# Patient Record
Sex: Male | Born: 1955 | Race: Black or African American | Hispanic: No | Marital: Married | Smoking: Former smoker
Health system: Southern US, Community
[De-identification: ages and names within clinical notes are randomized; demographics above are authoritative.]

## PROBLEM LIST (undated history)

## (undated) DIAGNOSIS — I10 Essential (primary) hypertension: Secondary | ICD-10-CM

## (undated) DIAGNOSIS — E291 Testicular hypofunction: Secondary | ICD-10-CM

## (undated) DIAGNOSIS — Z9889 Other specified postprocedural states: Secondary | ICD-10-CM

## (undated) DIAGNOSIS — E785 Hyperlipidemia, unspecified: Secondary | ICD-10-CM

## (undated) DIAGNOSIS — N529 Male erectile dysfunction, unspecified: Secondary | ICD-10-CM

## (undated) DIAGNOSIS — M1990 Unspecified osteoarthritis, unspecified site: Secondary | ICD-10-CM

## (undated) HISTORY — DX: Hyperlipidemia, unspecified: E78.5

## (undated) HISTORY — DX: Testicular hypofunction: E29.1

## (undated) HISTORY — DX: Unspecified osteoarthritis, unspecified site: M19.90

## (undated) HISTORY — PX: TONSILLECTOMY: SUR1361

## (undated) HISTORY — DX: Essential (primary) hypertension: I10

## (undated) HISTORY — PX: KNEE SURGERY: SHX244

## (undated) HISTORY — PX: INCISION AND DRAINAGE PERIRECTAL ABSCESS: SHX1804

## (undated) HISTORY — PX: OTHER SURGICAL HISTORY: SHX169

## (undated) HISTORY — PX: APPENDECTOMY: SHX54

## (undated) HISTORY — DX: Male erectile dysfunction, unspecified: N52.9

## (undated) HISTORY — DX: Other specified postprocedural states: Z98.890

---

## 1998-10-20 ENCOUNTER — Ambulatory Visit: Admission: RE | Admit: 1998-10-20 | Discharge: 1998-10-20 | Payer: Self-pay | Admitting: *Deleted

## 1998-10-20 ENCOUNTER — Encounter: Payer: Self-pay | Admitting: *Deleted

## 1998-12-15 ENCOUNTER — Encounter: Payer: Self-pay | Admitting: Occupational Medicine

## 1998-12-15 ENCOUNTER — Ambulatory Visit: Admission: RE | Admit: 1998-12-15 | Discharge: 1998-12-15 | Payer: Self-pay | Admitting: Occupational Medicine

## 1999-06-25 ENCOUNTER — Encounter: Admission: RE | Admit: 1999-06-25 | Discharge: 1999-09-23 | Payer: Self-pay | Admitting: Geriatric Medicine

## 1999-10-04 ENCOUNTER — Encounter: Payer: Self-pay | Admitting: Emergency Medicine

## 1999-10-04 ENCOUNTER — Emergency Department (HOSPITAL_COMMUNITY): Admission: EM | Admit: 1999-10-04 | Discharge: 1999-10-04 | Payer: Self-pay | Admitting: Emergency Medicine

## 2003-06-06 ENCOUNTER — Encounter: Admission: RE | Admit: 2003-06-06 | Discharge: 2003-06-06 | Payer: Self-pay | Admitting: Geriatric Medicine

## 2004-10-09 ENCOUNTER — Emergency Department (HOSPITAL_COMMUNITY): Admission: EM | Admit: 2004-10-09 | Discharge: 2004-10-10 | Payer: Self-pay | Admitting: Emergency Medicine

## 2006-01-31 ENCOUNTER — Inpatient Hospital Stay (HOSPITAL_COMMUNITY): Admission: EM | Admit: 2006-01-31 | Discharge: 2006-02-01 | Payer: Self-pay | Admitting: Emergency Medicine

## 2006-08-08 HISTORY — PX: COLONOSCOPY: SHX174

## 2006-08-11 ENCOUNTER — Ambulatory Visit: Payer: Self-pay | Admitting: Family Medicine

## 2006-08-19 ENCOUNTER — Ambulatory Visit: Payer: Self-pay | Admitting: Gastroenterology

## 2006-08-27 ENCOUNTER — Ambulatory Visit: Payer: Self-pay | Admitting: Gastroenterology

## 2006-10-22 ENCOUNTER — Ambulatory Visit: Payer: Self-pay | Admitting: Family Medicine

## 2006-10-24 ENCOUNTER — Ambulatory Visit: Payer: Self-pay | Admitting: Family Medicine

## 2006-10-27 ENCOUNTER — Ambulatory Visit: Payer: Self-pay | Admitting: Family Medicine

## 2007-04-09 ENCOUNTER — Inpatient Hospital Stay (HOSPITAL_COMMUNITY): Admission: EM | Admit: 2007-04-09 | Discharge: 2007-04-10 | Payer: Self-pay | Admitting: Emergency Medicine

## 2007-10-27 ENCOUNTER — Ambulatory Visit: Payer: Self-pay | Admitting: Internal Medicine

## 2007-10-27 ENCOUNTER — Ambulatory Visit: Payer: Self-pay | Admitting: *Deleted

## 2007-10-27 LAB — CONVERTED CEMR LAB
Alkaline Phosphatase: 67 units/L (ref 39–117)
BUN: 14 mg/dL (ref 6–23)
Basophils Absolute: 0 10*3/uL (ref 0.0–0.1)
CO2: 23 meq/L (ref 19–32)
Chloride: 100 meq/L (ref 96–112)
Creatinine, Ser: 0.87 mg/dL (ref 0.40–1.50)
HCT: 44.9 % (ref 39.0–52.0)
HDL: 47 mg/dL (ref >39)
LDL Cholesterol: 166 mg/dL — ABNORMAL HIGH (ref 0–99)
Lymphs Abs: 4.2 10*3/uL — ABNORMAL HIGH (ref 0.7–4.0)
MCV: 94.9 fL (ref 78.0–100.0)
Microalb, Ur: 0.26 mg/dL (ref 0.00–1.89)
Monocytes Relative: 7 % (ref 3–12)
Neutrophils Relative %: 43 % (ref 43–77)
Platelets: 257 10*3/uL (ref 150–400)
RDW: 12.5 % (ref 11.5–15.5)
Sodium: 138 meq/L (ref 135–145)
Total Bilirubin: 0.8 mg/dL (ref 0.3–1.2)
Total CHOL/HDL Ratio: 5.4
VLDL: 41 mg/dL — ABNORMAL HIGH (ref 0–40)

## 2007-12-07 ENCOUNTER — Encounter (INDEPENDENT_AMBULATORY_CARE_PROVIDER_SITE_OTHER): Payer: Self-pay | Admitting: Family Medicine

## 2007-12-07 ENCOUNTER — Ambulatory Visit: Payer: Self-pay | Admitting: Internal Medicine

## 2007-12-07 LAB — CONVERTED CEMR LAB: PSA: 1.77 ng/mL (ref 0.10–4.00)

## 2007-12-12 ENCOUNTER — Emergency Department (HOSPITAL_COMMUNITY): Admission: EM | Admit: 2007-12-12 | Discharge: 2007-12-12 | Payer: Self-pay | Admitting: Emergency Medicine

## 2007-12-14 ENCOUNTER — Ambulatory Visit (HOSPITAL_COMMUNITY): Admission: RE | Admit: 2007-12-14 | Discharge: 2007-12-14 | Payer: Self-pay | Admitting: Family Medicine

## 2008-04-01 ENCOUNTER — Ambulatory Visit: Payer: Self-pay | Admitting: Family Medicine

## 2008-04-11 ENCOUNTER — Ambulatory Visit: Payer: Self-pay | Admitting: Internal Medicine

## 2008-07-12 ENCOUNTER — Ambulatory Visit: Payer: Self-pay | Admitting: Family Medicine

## 2008-07-25 ENCOUNTER — Ambulatory Visit: Payer: Self-pay | Admitting: Family Medicine

## 2008-10-20 ENCOUNTER — Ambulatory Visit: Payer: Self-pay | Admitting: Family Medicine

## 2008-10-20 LAB — CONVERTED CEMR LAB
CO2: 22 meq/L (ref 19–32)
Calcium: 9.2 mg/dL (ref 8.4–10.5)
Chloride: 106 meq/L (ref 96–112)
Creatinine, Ser: 0.9 mg/dL (ref 0.40–1.50)
Glucose, Bld: 157 mg/dL — ABNORMAL HIGH (ref 70–99)
HDL: 40 mg/dL (ref >39)
Total Bilirubin: 0.8 mg/dL (ref 0.3–1.2)
Total CHOL/HDL Ratio: 4.8
Total Protein: 7.1 g/dL (ref 6.0–8.3)
Triglycerides: 247 mg/dL — ABNORMAL HIGH (ref <150)
VLDL: 49 mg/dL — ABNORMAL HIGH (ref 0–40)

## 2008-11-10 ENCOUNTER — Ambulatory Visit: Payer: Self-pay | Admitting: Family Medicine

## 2009-09-01 ENCOUNTER — Ambulatory Visit: Payer: Self-pay | Admitting: Family Medicine

## 2009-09-01 ENCOUNTER — Encounter (INDEPENDENT_AMBULATORY_CARE_PROVIDER_SITE_OTHER): Payer: Self-pay | Admitting: Adult Health

## 2009-09-01 LAB — CONVERTED CEMR LAB: Microalb, Ur: 0.66 mg/dL (ref 0.00–1.89)

## 2009-09-06 ENCOUNTER — Ambulatory Visit: Payer: Self-pay | Admitting: Internal Medicine

## 2009-12-22 ENCOUNTER — Emergency Department (HOSPITAL_COMMUNITY): Admission: EM | Admit: 2009-12-22 | Discharge: 2009-12-22 | Payer: Self-pay | Admitting: Family Medicine

## 2010-01-05 ENCOUNTER — Ambulatory Visit: Payer: Self-pay | Admitting: Family Medicine

## 2010-11-20 NOTE — Op Note (Signed)
NAME:  Jerry [name], ESCO NO.:  [id number]   MEDICAL RECORD NO.:  [id number]          PATIENT TYPE:  INP   LOCATION:  2550                         FACILITY:  [facility]   PHYSICIAN:  Lennie Muckle, MD      DATE OF BIRTH:  04-14-1956   DATE OF PROCEDURE:  DATE OF DISCHARGE:                               OPERATIVE REPORT   HISTORY OF PRESENT ILLNESS:  Jerry [name] is a 55 year old male, who was  seen previously in the emergency room for a perirectal abscess.  Procedure was attempted in the emergency room by the physicians;  however, the lesion was deeper than their comfort level and thus it was  elected to perform incision and drainage in the operating room with  rectal exam.   PREOPERATIVE DIAGNOSIS:  Perirectal abscess.   POSTOPERATIVE DIAGNOSIS:  Perirectal abscess.   PROCEDURE:  Incision and drainage of perirectal abscess and rectal exam.   SURGEON:  Lennie Muckle, MD   ASSISTANT:  None.   ANESTHESIA:  Patient was placed under general endotracheal anesthesia.   ESTIMATED BLOOD LOSS:  Approximately 20 mL.   COMPLICATIONS:  No immediate complications.   DRAINS:  A #16 Malecot placed in the abscess cavity.   DETAILS OF PROCEDURE:  Jerry [name] was identified in the preoperative  holding area, taken to the operating room suite where he was placed in  the supine position.  He was given general endotracheal anesthesia.  Preop antibiotics of Kefzol 1 gram was administered.  He was then placed  in a lithotomy position.  His buttock area was prepped and draped in the  usual sterile fashion.  A time-out identifying the patient and the  procedure was identified.  The area of concern was palpable just to the  left of the perineum.  A #10 blade was used to incise just lateral to  the area of fluctuance to avoid the external sphincter muscle.  The  abscess cavity was successfully entered.  There was purulent fluid,  which was expressed.  Anaerobic and aerobic cultures were  sent to the  microbiology lab.  The area was irrigated.  Hemostasis was achieved with  the electrocautery.  At that time, it was elected to place a Malecot  drain within the abscess cavity due to the difficulty of dressing  changes in the vicinity.  A #16 Malecot was placed in the abscess cavity  and secured with a 2-0 nylon suture.  A moist gauze was placed just  adjacent to the Malecot  drain to maintain hemostasis within the area overnight.  Dry gauze and  ABD pad were then placed for final dressing.  Patient was then extubated  and recovered in the operating room in stable condition.   SPECIMENS:  There were 2 microbiology cultures, 1 aerobic and 1  anaerobic.      Lennie Muckle, MD  Electronically Signed     ALA/MEDQ  D:  04/09/2007  T:  04/09/2007  Job:  [id number]

## 2010-11-20 NOTE — H&P (Signed)
NAME:  Jerry [name], Jerry [name] NO.:  [id number]   MEDICAL RECORD NO.:  [id number]          PATIENT TYPE:  EMS   LOCATION:  MAJO                         FACILITY:  [facility]   PHYSICIAN:  Dr. Freida Busman              DATE OF BIRTH:  Jan 24, 1956   DATE OF ADMISSION:  04/09/2007  DATE OF DISCHARGE:                              HISTORY & PHYSICAL   ADMITTING PHYSICIAN:  Dr. Bertram Savin.   PRIMARY CARE PHYSICIAN:  Dr. Merlene Laughter.   CHIEF COMPLAINT:  Perirectal pain.   HISTORY OF PRESENT ILLNESS:  Jerry [name] is a 51-year male patient who  is a diabetic, has a history of prior incision and drainage of a peri-  rectal abscess in July 2007 by Dr. Corliss Skains.  This was his first  occurrence.  The patient noticed increased swelling and pain this past  Monday, symptoms worsened.  He presented to the ER.  He was found to  have a glucose elevated at 414.  The EDP attempted I&D of the area with  recent upper puncture wounds, but was unsuccessful in completely  draining the abscessed region.  His subsequently performed an ultrasound  that showed a deeper 3 x 3-cm fluid collection adjacent to the rectum.  Surgery was consulted, review of systems as above.  The patient denies  fever.  He has had significant pain, especially in the past 24 hours.  This area was not draining, until they presented to the ER, where  several attempts were made to perform I&D.  He does have a history of  constipation and hemorrhoids.  He does check his glucometers daily, but  does not have sliding scale insulin available if needed.   SOCIAL HISTORY:  Married.  No alcohol, no tobacco.   PAST MEDICAL HISTORY:  1. Diabetes.  2. Constipation.  3. Hemorrhoids.   PAST SURGICAL HISTORY:  I&D of peri-rectal abscess 2007 by Marcille Blanco.  Colonoscopy 1 year ago, no significant findings.   ALLERGIES:  NKDA.   CURRENT MEDICATIONS:  Include:  1. Glucophage 1000 mg b.i.d.  2. Aspirin 325 mg daily.  3. Multiple vitamins,  over-the-counter daily.   PHYSICAL EXAMINATION:  GENERAL:  Pleasant male complaining of  significant perirectal pain.  VITAL SIGNS:  Temperature 97.8, BP 150/97, pulse 122 and regular,  respirations 22.  NEURO:  Patient is alert and oriented x3, moving all extremities x4.  No  focal deficits.  HEENT:  Head normocephalic, sclerae noninjected.  NECK:  Supple, no adenopathy.  CHEST:  Bilateral lung sounds, clear to auscultation.  Respiratory  effort is non-labored.  CARDIAC:  S1, S2.  No rubs, murmurs, thrills or gallops.  Pulse is  regular, tachycardic and no JVD.  ABDOMEN:  Soft, nontender, nondistended without hepatosplenomegaly,  masses or bruits.  RECTAL:  Again shows a fluctuant area in the left peri-rectal region,  areas of separate attempts to I&D with some tannish purulent drainage,  but still significant amount of fluctuance closer to the perirectal area  because of significant pain, unable to perform a DRE to determine if  a  deeper abscess area.  EXTREMITIES:  Symmetrical in appearance without edema, cyanosis or  clubbing.   LABS:  Sodium 132, potassium 4.4, CO2 22, glucose 414, BUN 10,  creatinine 0.9, hemoglobin 15, hematocrit 46.  A CBC was not checked.   DIAGNOSTIC:  See ultrasound as noted with the left perirectal abscess,  measuring about 3 x 3 cm.   IMPRESSION:  1. Perirectal abscess.  2. Uncontrolled diabetes with associated hyperglycemia and      hyponatremia.  3. Hypovolemia secondary to hyperglycemia with an associated      tachycardia.   PLAN:  1. Take the patient to the operating room for I&D today, as well as      rectal exam under anesthesia.  2. Dr. Freida Busman has ordered Ancef IV pre-op.  3. In addition, because of patient's glucose being significantly      elevated, he will be given 10 units of regular insulin subcu now      and will need to repeat a glucose level in the next 1-2 hours.  4. Will go ahead and check a CBC stat as well, since this was not  done      in the ER.  5. If the patient remains in the hospital due to complications related      to hyperglycemia/uncontrolled diabetes, will need to consult      internal medicine to assist.      Revonda Standard L. Rennis Harding, N.P.    ______________________________  Dr. Freida Busman    ALE/MEDQ  D:  04/09/2007  T:  04/10/2007  Job:  [id number]   cc:   Jerry [name], M.D.

## 2010-11-23 NOTE — Discharge Summary (Signed)
NAME:  Jerry [name], Jerry [name] NO.:  [id number]   MEDICAL RECORD NO.:  [id number]          PATIENT TYPE:  INP   LOCATION:  6729                         FACILITY:  [facility]   PHYSICIAN:  Lennie Muckle, MD      DATE OF BIRTH:  April 09, 1956   DATE OF ADMISSION:  04/09/2007  DATE OF DISCHARGE:  04/10/2007                               DISCHARGE SUMMARY   DISCHARGING PHYSICIAN:  Dr. Bertram Savin.   PRIMARY CARE PHYSICIAN:  Dr. Merlene Laughter.   CHIEF COMPLAINT/REASON FOR ADMISSION:  Jerry [name] is a 51-year male  patient with prior perirectal abscess drainage in 2007 by Dr. Corliss Skains who  presented to the ER with similar symptoms, increased swelling and pain,  glucose elevated at 414. Ultrasound demonstrated a deep 3 x 3-cm fluid  collection adjacent to the rectum. A surgical consultation was obtained.  The patient was evaluated by Dr. Freida Busman. His abdominal exam was  unremarkable.  His rectal exam demonstrated a fluctuant area in the left  perirectal region with some tannish purulent drainage noted from a small  incision made by the EDP in an attempt to perform I&D in the ER. Because  of significant pain and swelling, a DRE was unable to be performed.  The  patient's labs were checked which had a BMET with a sodium of 132,  potassium 4.4, glucose 414, BUN 10, creatinine 0.9. A CBC has not been  checked. An ultrasound was ordered as noted.   ADMISSION DIAGNOSES:  1. Perirectal abscess.  2. Diabetes with hyperglycemia and hyponatremia secondary to      infection.  3. Hypovolemia secondary to hypoglycemia and tachycardia.   HOSPITAL COURSE:  The patient was admitted from the ER. He was taken  directly to the operating room by Dr. Freida Busman to undergo intraoperative  incision and drainage of perirectal abscess.  He was given Ancef IV  preoperatively.   On the day of admission, the patient did undergo an intraoperative I&D  of a perirectal abscess, tolerated the procedure well and was sent  back  to the general floor. On postop day #1 he had a Malecot drain sutured  into place to his Foley bag.  He had no purulence from around this area.  The Foley bag was removed and the drain was irrigated and no purulence  returned. The patient was instructed on how to flush this drain tube and  was otherwise deemed appropriate for discharge home with a drain tube in  place and to follow up with Dr. Freida Busman in 1 week.   FINAL DISCHARGE DIAGNOSIS:  1. Perirectal abscess status post intraoperative incision and      drainage.  Cultures pending at time of discharge.  1. Diabetes with hyperglycemia secondary to infection.  2. Volume depletion resolved after intravenous fluid hydration.   DISCHARGE MEDICATIONS:  1. Metformin 1000 mg b.i.d.  2. Aspirin 325 mg daily.  3. Multiple vitamin daily.  4. Percocet 5/325, 1-2 tabs every 4h as needed for pain.  5. Over-the-counter ibuprofen with food or snack as directed.  6. Over-the-counter MiraLax daily to prevent constipation.  7. Over-the-counter Colace two times daily to prevent constipation.   WOUND CARE:  Flush drain two times daily with 20-30 mL of normal saline  while seated over toilet. I also recommended to use baby wipes to clean  around wound after BM.   DIET:  Carb modified. Also recommend check blood glucose levels before  meals and at bedtime and record.   ACTIVITY:  Return to work in 1 week. Increase activity slowly.  May walk  up steps.  May shower.  No lifting for 1 week.  No driving while taking  Percocet.   FOLLOW-UP APPOINTMENTS:  He is to see Dr. Freida Busman in the office on October  8.      Allison L. Kennith Center, MD  Electronically Signed    ALE/MEDQ  D:  05/14/2007  T:  05/15/2007  Job:  [id number]   cc:   Hal T. Stoneking, M.D.

## 2010-11-23 NOTE — Op Note (Signed)
NAME:  ARTURO, SOFRANKO NO.:  [id number]   MEDICAL RECORD NO.:  [id number]          PATIENT TYPE:  INP   LOCATION:  0103                         FAC[facility]  [facility]   PHYSICIAN:  Wilmon Arms. Corliss Skains, M.D. DATE OF BIRTH:  1955-09-05   DATE OF PROCEDURE:  01/30/2006  DATE OF DISCHARGE:                                 OPERATIVE REPORT   PREOPERATIVE DIAGNOSIS:  Perirectal abscess.   POSTOPERATIVE DIAGNOSIS:  Perirectal abscess.   PROCEDURE PERFORMED:  Incision and drainage of perirectal abscess.   SURGEON:  Wilmon Arms. Tsuei, M.D.   ANESTHESIA:  General endotracheal.   INDICATIONS:  The patient is a 55 year old male who is a diabetic, who  presents with a perirectal abscess.  The pain started on Sunday, but the  patient did not present to the emergency department until today.  He was  noted to have an elevated white count and had a large left-sided perirectal  abscess.  His blood sugars have also been poorly controlled with the last  several days with a blood sugar of 370.  The patient was brought to the  operating room for an incision and drainage of the abscess.   DESCRIPTION OF PROCEDURE:  The patient was brought to the operating room and  placed in supine position on the operating room table.  After an adequate  level of general anesthesia was obtained, the patient's legs were placed in  yellow fin stirrups in lithotomy position.  His perineum was prepped with  Betadine and draped in sterile fashion.  A large left-sided perirectal  abscess was noted.  Digital rectal examination revealed no other masses in  the rectum.  There was a fair amount of stool.  A Silver Bullet retractor  was inserted and the stool was irrigated and suctioned clean.  No masses  were noted higher up in the rectum.  An opening was created on the surface  of the perirectal abscess after infiltrating with 0.25% Marcaine.  A large  amount of yellowish purulent material was encountered.  The  abscess cavity  was opened widely and explored with a finger.  This appeared to track  superiorly through a small fistula.  The entire fistula tract was opened.  This area was thoroughly irrigated and cauterized for hemostasis.  The wound  was then packed with 4x4 gauze.  The patient was then moved back to a supine  position.  He was extubated and brought to the recovery room in stable  condition.  All sponge, instrument and needle counts were correct.      Wilmon Arms. Tsuei, M.D.  Electronically Signed     MKT/MEDQ  D:  01/30/2006  T:  01/30/2006  Job:  [id number]

## 2011-04-18 LAB — CULTURE, ROUTINE-ABSCESS

## 2011-04-18 LAB — I-STAT 8, (EC8 V) (CONVERTED LAB)
Acid-base deficit: 4 — ABNORMAL HIGH
BUN: 10
Bicarbonate: 21.3
Chloride: 103
Glucose, Bld: 414 — ABNORMAL HIGH
HCT: 46
Hemoglobin: 15.6
Operator id: 161631
Potassium: 4.4
Sodium: 132 — ABNORMAL LOW
TCO2: 22
pCO2, Ven: 37.2 — ABNORMAL LOW
pH, Ven: 7.366 — ABNORMAL HIGH

## 2011-04-18 LAB — DIFFERENTIAL
Basophils Absolute: 0
Basophils Relative: 0
Eosinophils Absolute: 0.1
Lymphocytes Relative: 18
Monocytes Relative: 7

## 2011-04-18 LAB — ANAEROBIC CULTURE

## 2011-04-18 LAB — POCT I-STAT CREATININE
Creatinine, Ser: 0.9
Operator id: 161631

## 2011-04-18 LAB — CBC
HCT: 42.6
WBC: 12.8 — ABNORMAL HIGH

## 2011-11-05 ENCOUNTER — Encounter: Payer: Self-pay | Admitting: Medical

## 2011-11-12 ENCOUNTER — Encounter: Payer: Self-pay | Admitting: Medical

## 2011-11-12 ENCOUNTER — Ambulatory Visit (INDEPENDENT_AMBULATORY_CARE_PROVIDER_SITE_OTHER): Payer: Medicare Other | Admitting: Medical

## 2011-11-12 ENCOUNTER — Other Ambulatory Visit: Payer: Self-pay | Admitting: Medical

## 2011-11-12 DIAGNOSIS — Z1211 Encounter for screening for malignant neoplasm of colon: Secondary | ICD-10-CM

## 2011-11-12 DIAGNOSIS — Z125 Encounter for screening for malignant neoplasm of prostate: Secondary | ICD-10-CM

## 2011-11-12 DIAGNOSIS — E119 Type 2 diabetes mellitus without complications: Secondary | ICD-10-CM | POA: Insufficient documentation

## 2011-11-12 DIAGNOSIS — N529 Male erectile dysfunction, unspecified: Secondary | ICD-10-CM | POA: Diagnosis not present

## 2011-11-12 DIAGNOSIS — M2550 Pain in unspecified joint: Secondary | ICD-10-CM

## 2011-11-12 DIAGNOSIS — E785 Hyperlipidemia, unspecified: Secondary | ICD-10-CM | POA: Diagnosis not present

## 2011-11-12 LAB — POCT URINALYSIS DIPSTICK
Glucose, UA: 500
Ketones, UA: NEGATIVE
Leukocytes, UA: NEGATIVE
Nitrite, UA: NEGATIVE
Protein, UA: NEGATIVE
Spec Grav, UA: 1.02
pH, UA: 5

## 2011-11-12 LAB — CBC WITH DIFFERENTIAL/PLATELET
Eosinophils Relative: 1 % (ref 0–5)
HCT: 41.3 % (ref 39.0–52.0)
Lymphocytes Relative: 46 % (ref 12–46)
MCHC: 36.3 g/dL — ABNORMAL HIGH (ref 30.0–36.0)
MCV: 94.7 fL (ref 78.0–100.0)
Neutrophils Relative %: 46 % (ref 43–77)

## 2011-11-12 LAB — COMPREHENSIVE METABOLIC PANEL
Albumin: 4.3 g/dL (ref 3.5–5.2)
Alkaline Phosphatase: 58 U/L (ref 39–117)
BUN: 11 mg/dL (ref 6–23)
CO2: 22 mEq/L (ref 19–32)
Calcium: 9.5 mg/dL (ref 8.4–10.5)
Chloride: 103 mEq/L (ref 96–112)
Glucose, Bld: 240 mg/dL — ABNORMAL HIGH (ref 70–99)
Sodium: 136 mEq/L (ref 135–145)

## 2011-11-12 LAB — LIPID PANEL
Cholesterol: 230 mg/dL — ABNORMAL HIGH (ref 0–200)
HDL: 45 mg/dL (ref >39)
LDL Cholesterol: 158 mg/dL — ABNORMAL HIGH (ref 0–99)

## 2011-11-12 LAB — POCT CBG (FASTING - GLUCOSE)-MANUAL ENTRY: Glucose Fasting, POC: 256 mg/dL — AB (ref 70–99)

## 2011-11-12 MED ORDER — SILDENAFIL CITRATE 100 MG PO TABS: ORAL_TABLET | ORAL | Status: DC

## 2011-11-12 NOTE — Progress Notes (Signed)
Subjective:   HPI  Jerry [name] is a 56 y.o. male who presents for a complete physical.   Last visit here 2008.    He reports concerns with ED x 1 year.  Can't get erections with intercourse, no morning erections, no erections in 1 year.  Interested in treatment options.   Wife has been very patient with him.  He has seen commercials for cialis and also vacuum devices, interested in either options.     He reports hx/o "rheumatoid arthritis".   Gets bad pains in knees and his back.   Pain is improved with activity, but can't stand in lines long.  Can't go on long trips without stopping and stretching.  Has morning stiffness that lasts for 5-7 minutes.   Gets some joint swelling in left knee.  Use ice, heat, topical alcohol.  Was advised by prior doctors to use stretching, epsom salt baths.    Preventative care: Last ophthalmology visit 1 year ago. Last dental visit 2 years ago. Last tetanus booster >10 years. Gets flu shot yearly.  Reviewed their medical, surgical, family, social, medication, and allergy history and updated chart as appropriate.   Past Medical History  Diagnosis Date  . Dyslipidemia   . Arthritis   . Diabetes mellitus     07/2006 with HgbA1C of 7.5%    Past Surgical History  Procedure Date  . Incision and drainage perirectal abscess   . Knee surgery     left knee - remote past  . Colonoscopy 08/2006    Dr. Russella Dar - repeat 2018    Family History  Problem Relation Age of Onset  . Diabetes Mother   . Other Father     death by MVA  . Other Brother     died of MVA  . Stroke Neg Hx   . Heart disease Neg Hx   . Cancer Neg Hx   . Hypertension Neg Hx     History   Social History  . Marital Status: Married    Spouse Name: N/A    Number of Children: N/A  . Years of Education: N/A   Occupational History  . retired     prior physical therapist   Social History Main Topics  . Smoking status: Former Smoker    Types: Cigarettes    Quit date: 11/11/2001    . Smokeless tobacco: Not on file  . Alcohol Use: No  . Drug Use: No  . Sexually Active: Not on file   Other Topics Concern  . Not on file   Social History Narrative   Married 22 years, 2 daughter and 1 son, exercise - treadmill daily, some weight bearing exercise    No current outpatient prescriptions on file prior to visit.    No Known Allergies  Review of Systems Constitutional: -fever, -chills, -sweats, -unexpected weight change, -anorexia, -fatigue Allergy: -sneezing, -itching, -congestion Dermatology: denies changing moles, rash, lumps, new worrisome lesions ENT: -runny nose, -ear pain, -sore throat, -hoarseness, -sinus pain, -teeth pain, -tinnitus, -hearing loss, -epistaxis Cardiology:  -chest pain, -palpitations, -edema, -orthopnea, -paroxysmal nocturnal dyspnea Respiratory: -cough, -shortness of breath, -dyspnea on exertion, -wheezing, -hemoptysis Gastroenterology: -abdominal pain, -nausea, -vomiting, -diarrhea, -constipation, -blood in stool, -changes in bowel movement, -dysphagia Hematology: -bleeding or bruising problems Musculoskeletal: +arthralgias, -myalgias, -joint swelling, +back pain, -neck pain, -cramping, -gait changes Ophthalmology: -vision changes, -eye redness, -itching, -discharge Urology: -dysuria, -difficulty urinating, -hematuria, -urinary frequency, -urgency, incontinence Neurology: -headache, -weakness, -tingling, -numbness, -speech abnormality, -memory loss, -falls, -dizziness  Psychology:  -depressed mood, -agitation, +sleep problems     Objective:   Physical Exam  Filed Vitals:   11/12/11 0926  BP: 112/80  Pulse: 92  Temp: 98 F (36.7 C)  Resp: 16    General appearance: alert, no distress, WD/WN, overweight black male  Skin: no worrisome lesions, scattered benign appealing macules on torso HEENT: normocephalic, conjunctiva/corneas normal, sclerae anicteric, PERRLA, EOMi, nares patent, no discharge or erythema, pharynx normal Oral cavity:  MMM, tongue normal, teeth upper denture, lower partial denture Neck: supple, no lymphadenopathy, no thyromegaly, no masses, normal ROM, no bruits Chest: non tender, normal shape and expansion Heart: RRR, normal S1, S2, no murmurs Lungs: CTA bilaterally, no wheezes, rhonchi, or rales Abdomen: +bs, soft, non tender, non distended, no masses, no hepatomegaly, no splenomegaly, no bruits Back: mild lumbar paraspinal tenderness, otherwise non tender, normal ROM, no scoliosis Musculoskeletal: knee sleeves on both knees, mild generalized tenderness, no swelling, mild pain with ROM, but otherwise upper extremities non tender, no obvious deformity, normal ROM throughout, lower extremities non tender, no obvious deformity, normal ROM throughout Extremities: no edema, no cyanosis, no clubbing Pulses: 2+ symmetric, upper and lower extremities, normal cap refill Neurological: alert, oriented x 3, CN2-12 intact, strength normal upper extremities and lower extremities, sensation normal throughout, DTRs 2+ throughout, no cerebellar signs, gait normal Psychiatric: normal affect, behavior normal, pleasant  GU: normal male external genitalia, nontender, no masses, no hernia, no lymphadenopathy Rectal: anus normal, prostate WNL but can palpate anterior prostate, occult negative stool   Assessment and Plan :    Encounter Diagnoses  Name Primary?  . Diabetes mellitus without mention of complication Yes  . Hyperlipidemia   . ED (erectile dysfunction)   . Polyarthralgia   . Screening for prostate cancer   . Screening for colon cancer    In general, discussed healthy lifestyle, diet, exercise, preventative care, vaccinations, and addressed their concerns.    DM type II - pending labs.  He denies diagnosis, but chart shows prior diagnosis.  ED - trial of Viagra, labs today, discussed causes, treatment options, risks/benefits of Viagra. Samples given  Polyarthralgia - seems osteoarthritis although he reports  hx/o RA.  Will consider treatment options pending labs.  PSA screening today.    Hemoccults negative today.   Follow-up pending labs

## 2011-11-13 ENCOUNTER — Ambulatory Visit: Payer: Medicare Other | Admitting: Medical

## 2011-11-13 LAB — HEMOGLOBIN A1C
Hgb A1c MFr Bld: 9.1 % — ABNORMAL HIGH (ref <5.7)
Mean Plasma Glucose: 214 mg/dL — ABNORMAL HIGH (ref <117)

## 2011-11-13 LAB — SEDIMENTATION RATE: Sed Rate: 6 mm/hr (ref 0–16)

## 2011-11-13 LAB — TESTOSTERONE: Testosterone: 205.46 ng/dL — ABNORMAL LOW (ref 300–890)

## 2011-11-15 ENCOUNTER — Ambulatory Visit (INDEPENDENT_AMBULATORY_CARE_PROVIDER_SITE_OTHER): Payer: Medicare Other | Admitting: Medical

## 2011-11-15 ENCOUNTER — Encounter: Payer: Self-pay | Admitting: Medical

## 2011-11-15 VITALS — BP 150/90 | HR 100 | Temp 98.1°F | Resp 16 | Wt 266.0 lb

## 2011-11-15 DIAGNOSIS — E785 Hyperlipidemia, unspecified: Secondary | ICD-10-CM | POA: Diagnosis not present

## 2011-11-15 DIAGNOSIS — M170 Bilateral primary osteoarthritis of knee: Secondary | ICD-10-CM

## 2011-11-15 DIAGNOSIS — E669 Obesity, unspecified: Secondary | ICD-10-CM | POA: Diagnosis not present

## 2011-11-15 DIAGNOSIS — IMO0001 Reserved for inherently not codable concepts without codable children: Secondary | ICD-10-CM

## 2011-11-15 DIAGNOSIS — E291 Testicular hypofunction: Secondary | ICD-10-CM

## 2011-11-15 DIAGNOSIS — M1710 Unilateral primary osteoarthritis, unspecified knee: Secondary | ICD-10-CM

## 2011-11-15 LAB — PROLACTIN: Prolactin: 3.7 ng/mL (ref 2.1–17.1)

## 2011-11-15 MED ORDER — TESTOSTERONE 20.25 MG/1.25GM (1.62%) TD GEL: 1.0000 | Freq: Every day | TRANSDERMAL | Status: DC

## 2011-11-15 MED ORDER — METFORMIN HCL 850 MG PO TABS: 850.0000 mg | ORAL_TABLET | Freq: Two times a day (BID) | ORAL | Status: DC

## 2011-11-15 MED ORDER — ATORVASTATIN CALCIUM 40 MG PO TABS: 40.0000 mg | ORAL_TABLET | Freq: Every day | ORAL | Status: DC

## 2011-11-15 MED ORDER — HYDROCODONE-ACETAMINOPHEN 5-500 MG PO TABS: ORAL_TABLET | ORAL | Status: DC

## 2011-11-15 MED ORDER — INSULIN GLARGINE 100 UNIT/ML ~~LOC~~ SOLN: 10.0000 [IU] | Freq: Every day | SUBCUTANEOUS | Status: DC

## 2011-11-15 NOTE — Patient Instructions (Signed)
Diabetes: Begin Metformin tablets twice daily.  Take with food.  Begin Lantus solostar pen, 10 units at bedtime every night.  Cholesterol Begin Lipitor once daily at bedtime.  Low testosterone: Begin Androgel topical.  Use 1 pump per shoulder daily in the morning.  Avoid skin contact with women and children for at least 2 hours.  Pain: Begin Lortab as needed once to twice daily for knee pain.  You can alternate this with OTC Aleve twice daily.   I need to see you back in 6 weeks for recheck and repeat cholesterol and testosterone labs.

## 2011-11-15 NOTE — Progress Notes (Signed)
Subjective:   HPI  Jerry [name] is a 56 y.o. male who presents for f/u on abnormal labs from his physical this week.  He request something for his arthritis in his knees.  He has tried multiple OTC medication without relief.  The knee pain limits his activity.  He uses knee sleeves bilat.    No other c/o.  The following portions of the patient's history were reviewed and updated as appropriate: allergies, current medications, past family history, past medical history, past social history, past surgical history and problem list.  Past Medical History  Diagnosis Date  . Dyslipidemia   . Arthritis   . Diabetes mellitus     07/2006 with HgbA1C of 7.5%  . Hypogonadism male     No Known Allergies   Review of Systems ROS reviewed and was negative other than noted in HPI or above.    Objective:   Physical Exam  General appearance: alert, no distress, WD/WN  Assessment and Plan :     Encounter Diagnoses  Name Primary?  . Type II or unspecified type diabetes mellitus without mention of complication, uncontrolled Yes  . Hyperlipidemia   . Hypogonadism male   . Obesity   . Osteoarthritis of both knees    Reviewed his recent abnormal labs.  Discussed diagnosis of diabetes, possible complications, goals.  Begin Lantus Solostar pen 10 units QHS.  Demonstrated use of the pen, safety, dosing.  Begin Metformin BID.  Discussed diet, exercise, foods to avoid.  Recheck 6wk.  Hyperlipidemia - begin Lipitor.  discussed risk of heart disease, goals for lipid therapy  hypogonadism - begin Androgel, discussed risks/benefits  Obesity - discussed need to c/t exercise, healthy diet, and need for weight loss  OA of knees - Lortab prn use.   No improvement with multiple NSAIDs.  Spent about 30 min face to face in discussion of labs, diagnoses, treatments, and f/u.

## 2011-11-26 ENCOUNTER — Telehealth: Payer: Self-pay | Admitting: Medical

## 2011-11-26 MED ORDER — HYDROCODONE-ACETAMINOPHEN 5-500 MG PO TABS: ORAL_TABLET | ORAL | Status: DC

## 2011-11-26 NOTE — Progress Notes (Signed)
Addended by: Janeice Robinson on: 11/26/2011 04:14 PM   Modules accepted: Orders

## 2011-11-26 NOTE — Telephone Encounter (Signed)
I CALLED PHARMACY & VERIFIED THAT THE LORTAB HADN'T BEEN FILLED AT ANY [facility]

## 2012-03-26 ENCOUNTER — Telehealth: Payer: Self-pay | Admitting: Internal Medicine

## 2012-03-26 ENCOUNTER — Other Ambulatory Visit: Payer: Self-pay | Admitting: Medical

## 2012-03-26 MED ORDER — HYDROCODONE-ACETAMINOPHEN 5-500 MG PO TABS: ORAL_TABLET | ORAL | Status: DC

## 2012-03-26 NOTE — Telephone Encounter (Signed)
Go ahead and call this in 

## 2012-03-26 NOTE — Telephone Encounter (Signed)
Called in per jcl 

## 2012-03-26 NOTE — Telephone Encounter (Signed)
Called lortab in per [employer]

## 2012-06-23 ENCOUNTER — Other Ambulatory Visit: Payer: Self-pay | Admitting: Family Medicine

## 2012-06-24 NOTE — Telephone Encounter (Signed)
Needs appt, fasting

## 2012-06-24 NOTE — Telephone Encounter (Signed)
I LEFT A MESSAGE ON HIS VOICEMAIL THAT HE NEEDS A FASTING APPOINTMENT. CLS

## 2012-06-24 NOTE — Telephone Encounter (Signed)
I THINK THIS IS YOUR PT 

## 2012-11-05 DEATH — deceased

## 2012-11-10 ENCOUNTER — Encounter: Payer: Self-pay | Admitting: Medical

## 2012-11-10 ENCOUNTER — Telehealth: Payer: Self-pay | Admitting: Family Medicine

## 2012-11-10 ENCOUNTER — Ambulatory Visit
Admission: RE | Admit: 2012-11-10 | Discharge: 2012-11-10 | Disposition: A | Discharge status: Home / Self Care | Payer: Medicare Other | Source: Ambulatory Visit | Attending: Medical | Admitting: Medical

## 2012-11-10 ENCOUNTER — Ambulatory Visit (INDEPENDENT_AMBULATORY_CARE_PROVIDER_SITE_OTHER): Payer: Medicare Other | Admitting: Medical

## 2012-11-10 VITALS — BP 130/80 | HR 100 | Temp 98.2°F | Wt 277.0 lb

## 2012-11-10 DIAGNOSIS — M25562 Pain in left knee: Secondary | ICD-10-CM

## 2012-11-10 DIAGNOSIS — M1710 Unilateral primary osteoarthritis, unspecified knee: Secondary | ICD-10-CM | POA: Diagnosis not present

## 2012-11-10 DIAGNOSIS — N529 Male erectile dysfunction, unspecified: Secondary | ICD-10-CM

## 2012-11-10 DIAGNOSIS — M25569 Pain in unspecified knee: Secondary | ICD-10-CM

## 2012-11-10 MED ORDER — TADALAFIL 20 MG PO TABS: ORAL_TABLET | ORAL | Status: DC

## 2012-11-10 MED ORDER — HYDROCODONE-ACETAMINOPHEN 5-325 MG PO TABS: 1.0000 | ORAL_TABLET | Freq: Four times a day (QID) | ORAL | Status: DC | PRN

## 2012-11-10 NOTE — Telephone Encounter (Signed)
Message copied by Janeice Robinson on Tue Nov 10, 2012  9:38 AM ------      Message from: Jac Canavan      Created: Tue Nov 10, 2012  7:48 AM       Needs 30 min fasting visit or CPX ------

## 2012-11-10 NOTE — Progress Notes (Signed)
Subjective: Here for bilat knee pain.  Mostly left.  Uses knee sleeves, ordered some knee braces from ad on TV.  Wants the form completed. Knees bother him more with activity or when weather acts up.  Long travel aggravates the knee pain.  Working around the house can aggravate things.  Standing in long line hurts the knees.  Has to take breaks cutting the grass due to pain.   Knees sometimes swell, L>R.  He notes that the pain medication I gave last year helped.  Does stretching for exercise.   Walks on treadmill 10-15 minutes daily unless knees are acting up.  No calve pain.  Sometimes has low back pain.   No numbness, tingling, weakness, redness, bruising.  ED - tried Viagra about 6-7 times last year, didn't help a lot.  Wants to try different medication.    Past Medical History  Diagnosis Date  . Dyslipidemia   . Arthritis   . Diabetes mellitus     07/2006 with HgbA1C of 7.5%  . Hypogonadism male    ROS as in subjective  Objective: Gen: wd, wn, obese male Knees: +mild click with McMurray on the left, mild anterior laxity, otherwise nontender, normal ROM, and no other joint laxity of bilat knees MSK: hips nontender, normal ROM, ankles nontender, normal ROM Neurovascularly intact LE  Assessment: Encounter Diagnoses  Name Primary?  . Knee pain, bilateral Yes  . Erectile dysfunction   . Type II or unspecified type diabetes mellitus without mention of complication, uncontrolled    Plan: Knee pain - likely OA.  Send for bilat knee xrays.  Advised ice prn, Lortab prn, relative rest prn. F/u pending xrays.    ED - trial of Cialis, recheck 16mo  DM type II - last visit over a year ago.   Due for routine diabetes f/u.   Advised he return within a month fasting for  Diabetic followup.

## 2012-11-10 NOTE — Telephone Encounter (Signed)
Pt was called and appt was made for today.

## 2012-11-10 NOTE — Patient Instructions (Signed)
Knee pain  Go for xrays today  When the pain acts up, try OTC Aleve or Tylenol 500mg .     You can use the hydrocodone only for worse pain  Use ice, rest, and elevation when very painful or swollen  Erectile dysfunction   You can try 1/2 - 1 tablet Cialis as needed, maximum one daily for erections  Return within 1 month fasting for diabetes followup.  Tadalafil tablets (Cialis) What is this medicine? TADALAFIL (tah DA la fil) is used to treat erection problems in men. It is also used for enlargement of the prostate gland in men, a condition called benign prostatic hyperplasia or BPH. This medicine improves urine flow and reduces BPH symptoms. This medicine can also treat both erection problems and BPH when they occur together. This medicine may be used for other purposes; ask your health care provider or pharmacist if you have questions. What should I tell my health care provider before I take this medicine? They need to know if you have any of these conditions: -bleeding disorders -eye or vision problems, including a rare inherited eye disease called retinitis pigmentosa -anatomical deformation of the penis, Peyronie's disease, or history of priapism (painful and prolonged erection) -heart disease, angina, a history of heart attack, irregular heart beats, or other heart problems -high or low blood pressure -history of blood diseases, like sickle cell anemia or leukemia -history of stomach bleeding -kidney disease -liver disease -stroke -an unusual or allergic reaction to tadalafil, other medicines, foods, dyes, or preservatives -pregnant or trying to get pregnant -breast-feeding How should I use this medicine? Take this medicine by mouth with a glass of water. Follow the directions on the prescription label. You may take this medicine with or without meals. When this medicine is used for erection problems, your doctor may prescribe it to be taken once daily or as needed. If you  are taking the medicine as needed, you may be able to have sexual activity 30 minutes after taking it and for up to 36 hours after taking it. Whether you are taking the medicine as needed or once daily, you should not take more than one dose per day. If you are taking this medicine for symptoms of benign prostatic hyperplasia (BPH) or to treat both BPH and an erection problem, take the dose once daily at about the same time each day. Do not take your medicine more often than directed. Talk to your pediatrician regarding the use of this medicine in children. Special care may be needed. Overdosage: If you think you have taken too much of this medicine contact a poison control center or emergency room at once. NOTE: This medicine is only for you. Do not share this medicine with others. What if I miss a dose? If you are taking this medicine as needed for erection problems, this does not apply. If you miss a dose while taking this medicine once daily for an erection problem, benign prostatic hyperplasia, or both, take it as soon as you remember, but do not take more than one dose per day. What may interact with this medicine? Do not take this medicine with any of the following medications: -nitrates like amyl nitrite, isosorbide dinitrate, isosorbide mononitrate, nitroglycerin -other medicines for erectile dysfunction like avanafil, sildenafil, vardenafil -other tadalafil products ([employer]circa)  This medicine may also interact with the following medications: -certain drugs for high blood pressure -certain drugs for the treatment of HIV infection or AIDS -certain drugs used for fungal or yeast infections,  like fluconazole, itraconazole, ketoconazole, and voriconazole -certain drugs used for seizures like carbamazepine, phenytoin, and phenobarbital -grapefruit juice -macrolide antibiotics like clarithromycin, erythromycin, troleandomycin -medicines for prostate problems -rifabutin, rifampin or  rifapentine This list may not describe all possible interactions. Give your health care provider a list of all the medicines, herbs, non-prescription drugs, or dietary supplements you use. Also tell them if you smoke, drink alcohol, or use illegal drugs. Some items may interact with your medicine. What should I watch for while using this medicine? If you notice any changes in your vision while taking this drug, call your doctor or health care professional as soon as possible. Stop using this medicine and call your health care provider right away if you have a loss of sight in one or both eyes. Contact you doctor or health care professional right away if the erection lasts longer than 4 hours or if it becomes painful. This may be a sign of serious problem and must be treated right away to prevent permanent damage. If you experience symptoms of nausea, dizziness, chest pain or arm pain upon initiation of sexual activity after taking this medicine, you should refrain from further activity and call your doctor or health care professional as soon as possible. Do not drink alcohol to excess (examples, 5 glasses of wine or 5 shots of whiskey) when taking this medicine. When taken in excess, alcohol can increase your chances of getting a headache or getting dizzy, increasing your heart rate or lowering your blood pressure. Using this medicine does not protect you or your partner against HIV infection (the virus that causes AIDS) or other sexually transmitted diseases. What side effects may I notice from receiving this medicine? Side effects that you should report to your doctor or health care professional as soon as possible: -allergic reactions like skin rash, itching or hives, swelling of the face, lips, or tongue -breathing problems -changes in hearing -changes in vision -chest pain -erection lasting more than 4 hours -fast, irregular heartbeat -seizures  Side effects that usually do not require medical  attention (report to your doctor or health care professional if they continue or are bothersome): -back pain -dizziness -flushing -headache -indigestion -muscle aches -nausea -stuffy or runny nose This list may not describe all possible side effects. Call your doctor for medical advice about side effects. You may report side effects to FDA at 1-800-FDA-1088. Where should I keep my medicine? Keep out of the reach of children. Store at room temperature between 15 and 30 degrees C (59 and 86 degrees F). Throw away any unused medicine after the expiration date. NOTE: This sheet is a summary. It may not cover all possible information. If you have questions about this medicine, talk to your doctor, pharmacist, or health care provider.  2012, [employer] Standard. (12/18/2010 1:19:37 PM)

## 2012-11-10 NOTE — Telephone Encounter (Signed)
Message about scheduling an appointment per Crosby Oyster PA-C. CLS

## 2012-11-23 ENCOUNTER — Encounter: Payer: Self-pay | Admitting: Medical

## 2012-11-23 ENCOUNTER — Telehealth: Payer: Self-pay | Admitting: Medical

## 2012-11-23 NOTE — Telephone Encounter (Signed)
Jerry [name], this patient is requesting a note excusing him from jury duty because of his knee. CLS

## 2012-11-23 NOTE — Telephone Encounter (Signed)
PT'S WIFE STOPPED BY AND PICKED UP LETTER CONCERNING JURY DUTY AND STATED THAT PT HAD TRIED TO GET CIALIS FILLED AND THE COST WAS TOO HIGH. PT'S WIFE STATES IT WAS OVER 300 DOLLARS. PT IS REQUESTING SAMPLES. PLEASE CALL PT AND INFORM.

## 2012-11-24 ENCOUNTER — Telehealth: Payer: Self-pay | Admitting: Family Medicine

## 2012-11-24 ENCOUNTER — Other Ambulatory Visit: Payer: Self-pay | Admitting: Family Medicine

## 2012-11-24 MED ORDER — TADALAFIL 20 MG PO TABS: ORAL_TABLET | ORAL | Status: DC

## 2012-11-24 NOTE — Telephone Encounter (Signed)
Patient is aware and samples are up front. CLS 

## 2012-11-24 NOTE — Telephone Encounter (Signed)
If we have some Cialis 20mg  samples, then pls give him 2-3 tablets.  Not sure what we have in sample closet.

## 2012-11-24 NOTE — Telephone Encounter (Signed)
Patient is aware of his appointment to see Dr. Lequita Halt on January 14, 2013 @ 1015 am. CLS [phone]

## 2012-11-30 ENCOUNTER — Encounter (HOSPITAL_COMMUNITY): Payer: Self-pay | Admitting: Emergency Medicine

## 2012-11-30 ENCOUNTER — Emergency Department (HOSPITAL_COMMUNITY)
Admission: EM | Admit: 2012-11-30 | Discharge: 2012-11-30 | Disposition: A | Discharge status: Home / Self Care | Payer: Medicare Other | Attending: Emergency Medicine | Admitting: Emergency Medicine

## 2012-11-30 DIAGNOSIS — E785 Hyperlipidemia, unspecified: Secondary | ICD-10-CM | POA: Insufficient documentation

## 2012-11-30 DIAGNOSIS — K610 Anal abscess: Secondary | ICD-10-CM

## 2012-11-30 DIAGNOSIS — R5381 Other malaise: Secondary | ICD-10-CM | POA: Insufficient documentation

## 2012-11-30 DIAGNOSIS — Z87891 Personal history of nicotine dependence: Secondary | ICD-10-CM | POA: Insufficient documentation

## 2012-11-30 DIAGNOSIS — K612 Anorectal abscess: Secondary | ICD-10-CM | POA: Diagnosis not present

## 2012-11-30 DIAGNOSIS — M2550 Pain in unspecified joint: Secondary | ICD-10-CM | POA: Diagnosis not present

## 2012-11-30 DIAGNOSIS — E1169 Type 2 diabetes mellitus with other specified complication: Secondary | ICD-10-CM | POA: Insufficient documentation

## 2012-11-30 DIAGNOSIS — Z794 Long term (current) use of insulin: Secondary | ICD-10-CM | POA: Insufficient documentation

## 2012-11-30 DIAGNOSIS — E291 Testicular hypofunction: Secondary | ICD-10-CM | POA: Diagnosis not present

## 2012-11-30 DIAGNOSIS — R5383 Other fatigue: Secondary | ICD-10-CM | POA: Insufficient documentation

## 2012-11-30 DIAGNOSIS — Z79899 Other long term (current) drug therapy: Secondary | ICD-10-CM | POA: Insufficient documentation

## 2012-11-30 DIAGNOSIS — M25569 Pain in unspecified knee: Secondary | ICD-10-CM | POA: Diagnosis not present

## 2012-11-30 DIAGNOSIS — N529 Male erectile dysfunction, unspecified: Secondary | ICD-10-CM

## 2012-11-30 LAB — CBC
HCT: 44.5 % (ref 39.0–52.0)
Hemoglobin: 13.9 g/dL (ref 13.0–17.0)
MCHC: 31.2 g/dL (ref 30.0–36.0)
RBC: 3.95 MIL/uL — ABNORMAL LOW (ref 4.22–5.81)

## 2012-11-30 LAB — POCT I-STAT, CHEM 8
BUN: 7 mg/dL (ref 6–23)
Chloride: 105 mEq/L (ref 96–112)
Creatinine, Ser: 0.8 mg/dL (ref 0.50–1.35)
Glucose, Bld: 216 mg/dL — ABNORMAL HIGH (ref 70–99)
Hemoglobin: 13.6 g/dL (ref 13.0–17.0)
Potassium: 3.5 mEq/L (ref 3.5–5.1)
Sodium: 138 mEq/L (ref 135–145)

## 2012-11-30 MED ORDER — MORPHINE SULFATE 4 MG/ML IJ SOLN
4.0000 mg | Freq: Once | INTRAMUSCULAR | Status: AC
  Administered 2012-11-30: 4 mg via INTRAVENOUS
  Filled 2012-11-30: qty 1

## 2012-11-30 MED ORDER — LIDOCAINE-EPINEPHRINE (PF) 1 %-1:200000 IJ SOLN
INTRAMUSCULAR | Status: AC
  Administered 2012-11-30: 30 mL
  Filled 2012-11-30: qty 10

## 2012-11-30 MED ORDER — OXYCODONE-ACETAMINOPHEN 5-325 MG PO TABS: 1.0000 | ORAL_TABLET | ORAL | Status: DC | PRN

## 2012-11-30 MED ORDER — AMOXICILLIN-POT CLAVULANATE 875-125 MG PO TABS: 1.0000 | ORAL_TABLET | Freq: Two times a day (BID) | ORAL | Status: DC

## 2012-11-30 MED ORDER — HYDROMORPHONE HCL PF 1 MG/ML IJ SOLN
1.0000 mg | Freq: Once | INTRAMUSCULAR | Status: AC
  Administered 2012-11-30: 1 mg via INTRAVENOUS
  Filled 2012-11-30: qty 1

## 2012-11-30 MED ORDER — SODIUM CHLORIDE 0.9 % IV SOLN
3.0000 g | Freq: Once | INTRAVENOUS | Status: AC
  Administered 2012-11-30: 3 g via INTRAVENOUS
  Filled 2012-11-30: qty 3

## 2012-11-30 MED ORDER — AMOXICILLIN-POT CLAVULANATE 875-125 MG PO TABS: 1.0000 | ORAL_TABLET | Freq: Two times a day (BID) | ORAL | Status: AC

## 2012-11-30 MED ORDER — MORPHINE SULFATE 4 MG/ML IJ SOLN
6.0000 mg | Freq: Once | INTRAMUSCULAR | Status: AC
  Administered 2012-11-30: 6 mg via INTRAVENOUS
  Filled 2012-11-30 (×2): qty 1

## 2012-11-30 NOTE — Procedures (Signed)
Incision and Drainage PERIANAL ABSCESS Procedure Note  Pre-operative Diagnosis: left perianal abscess  Post-operative Diagnosis: same  Indications: pt presents with perianal abscess 3 cm.  The procedure has been discussed with the patient.  Alternative therapies have been discussed with the patient.  Operative risks include bleeding,  Infection,  Organ injury,  Nerve injury,  Blood vessel injury,  DVT,  Pulmonary embolism,  Death,  And possible reoperation.  Medical management risks include worsening of present situation.  The success of the procedure is 50 -90 % at treating patients symptoms.  The patient understands and agrees to proceed.  Anesthesia: 1% plain lidocaine  Procedure Details  The procedure, risks and complications have been discussed in detail (including, but not limited to airway compromise, infection, bleeding) with the patient, and the patient has signed consent to the procedure.  The skin was sterilely prepped and draped over the affected area in the usual fashion. After adequate local anesthesia, I&D with a #11 and 15 blade was performed on the left perianal position 3 cm from the verge. . Purulent drainage: present wound packed.   The patient was observed until stable.  Findings: pus  EBL: 10 cc's  Drains: iodoform  Condition: Tolerated procedure well   Complications: none.

## 2012-11-30 NOTE — ED Notes (Signed)
Pt states that he has an abscess "on his rectum" x 3 days.  States it is painful to move or sit.  States he is nauseous but no vomiting.

## 2012-11-30 NOTE — Consult Note (Signed)
Reason for Consult:perianal abscess Referring Physician: Dr Dava Najjar is an 57 y.o. male.  HPI: asked to see pt at request of Dr Delsa Bern for perianal abscess.  He has had painfor 3 days at verge of anus.  No fever or chills.  Some swelling noted.  No blood in stool.  Past Medical History  Diagnosis Date  . Dyslipidemia   . Arthritis   . Diabetes mellitus     07/2006 with HgbA1C of 7.5%  . Hypogonadism male     Past Surgical History  Procedure Laterality Date  . Incision and drainage perirectal abscess    . Knee surgery      left knee - remote past  . Colonoscopy  08/2006    Dr. Russella Dar - repeat 2018    Family History  Problem Relation Age of Onset  . Diabetes Mother   . Other Father     death by MVA  . Other Brother     died of MVA  . Stroke Neg Hx   . Heart disease Neg Hx   . Cancer Neg Hx   . Hypertension Neg Hx     Social History:  reports that he quit smoking about 11 years ago. His smoking use included Cigarettes. He smoked 0.00 packs per day. He has never used smokeless tobacco. He reports that he does not drink alcohol or use illicit drugs.  Allergies: No Known Allergies  Medications: I have reviewed the patient's current medications.  Results for orders placed during the hospital encounter of 11/30/12 (from the past 48 hour(s))  CBC     Status: Abnormal   Collection Time    11/30/12  8:42 AM      Result Value Range   WBC 7.4  4.0 - 10.5 K/uL   RBC 3.95 (*) 4.22 - 5.81 MIL/uL   Hemoglobin 13.9  13.0 - 17.0 g/dL   HCT 16.1  09.6 - 04.5 %   MCV 112.7 (*) 78.0 - 100.0 fL   MCH 35.2 (*) 26.0 - 34.0 pg   MCHC 31.2  30.0 - 36.0 g/dL   RDW 40.9  81.1 - 91.4 %   Platelets 209  150 - 400 K/uL  POCT I-STAT, CHEM 8     Status: Abnormal   Collection Time    11/30/12  9:24 AM      Result Value Range   Sodium 138  135 - 145 mEq/L   Potassium 3.5  3.5 - 5.1 mEq/L   Chloride 105  96 - 112 mEq/L   BUN 7  6 - 23 mg/dL   Creatinine, Ser 7.82  0.50  - 1.35 mg/dL   Glucose, Bld 956 (*) 70 - 99 mg/dL   Calcium, Ion 2.13  0.86 - 1.23 mmol/L   TCO2 22  0 - 100 mmol/L   Hemoglobin 13.6  13.0 - 17.0 g/dL   HCT 57.8  46.9 - 62.9 %    No results found.  Review of Systems  Constitutional: Negative for fever and chills.  Respiratory: Negative.   Gastrointestinal: Positive for abdominal pain.  Musculoskeletal: Positive for myalgias and joint pain.  Skin: Negative.   Neurological: Negative.   Psychiatric/Behavioral: Negative.    Blood pressure 148/91, pulse 97, temperature 97.4 F (36.3 C), resp. rate 17, SpO2 98.00%. Physical Exam  Neck: Normal range of motion. Neck supple.  Genitourinary:     Skin: Skin is warm and dry.  Psychiatric: He has a normal mood and affect. His  behavior is normal. Judgment and thought content normal.    Assessment/Plan: Perianal abscess 3 cm Recommend drainage and this can be done in ED.  R isks,  Benefits and alternative treatments discussed.  Risk of bleeding,  Infection,  More surgery,  Fistula and organ injury.  He and wife agree.  Under sterile conditions,  The perianal region was prepped and drapped in a sterile fashion.  1 % lidocaine used and total of 6 cc injected.  Small criciate incision made and pus expressed.  Pack with 1/4 inch iodoform.  Tolerated well.  Will d/c home and follow up in 2 days at [facility].  Emalie Mcwethy A. 11/30/2012, 11:48 AM

## 2012-11-30 NOTE — ED Provider Notes (Addendum)
History     CSN: [id number]  Arrival date & time 11/30/12  0803   First MD Initiated Contact with Patient 11/30/12 [phone]      Chief Complaint  Patient presents with  . Abscess    (Consider location/radiation/quality/duration/timing/severity/associated sxs/prior treatment) HPI Complains of painful swollen area at left perinaeal area onset 3 days ago. No treatment prior to coming here pain is worse with sitting on the area with pressure to the area improved with remaining still. She denies nausea admits to generalized weakness associated with complaint. No fever no treatment prior to coming here. Past Medical History  Diagnosis Date  . Dyslipidemia   . Arthritis   . Diabetes mellitus     07/2006 with HgbA1C of 7.5%  . Hypogonadism male     Past Surgical History  Procedure Laterality Date  . Incision and drainage perirectal abscess    . Knee surgery      left knee - remote past  . Colonoscopy  08/2006    Dr. Russella Dar - repeat 2018    Family History  Problem Relation Age of Onset  . Diabetes Mother   . Other Father     death by MVA  . Other Brother     died of MVA  . Stroke Neg Hx   . Heart disease Neg Hx   . Cancer Neg Hx   . Hypertension Neg Hx     History  Substance Use Topics  . Smoking status: Former Smoker    Types: Cigarettes    Quit date: 11/11/2001  . Smokeless tobacco: Not on file  . Alcohol Use: No      Review of Systems  HENT: Negative.   Respiratory: Negative.   Cardiovascular: Negative.   Gastrointestinal: Negative.   Musculoskeletal: Negative.   Skin: Positive for wound.  Neurological: Positive for weakness.  Psychiatric/Behavioral: Negative.   All other systems reviewed and are negative.    Allergies  Review of patient's allergies indicates no known allergies.  Home Medications   Current Outpatient Rx  Name  Route  Sig  Dispense  Refill  . EXPIRED: atorvastatin (LIPITOR) 40 MG tablet   Oral   Take 1 tablet (40 mg total) by mouth  daily.   30 tablet   5   . HYDROcodone-acetaminophen (NORCO/VICODIN) 5-325 MG per tablet   Oral   Take 1 tablet by mouth every 6 (six) hours as needed for pain.   30 tablet   0   . EXPIRED: insulin glargine (LANTUS SOLOSTAR) 100 UNIT/ML injection   Subcutaneous   Inject 10 Units into the skin at bedtime.   3 pen   5   . EXPIRED: metFORMIN (GLUCOPHAGE) 850 MG tablet   Oral   Take 1 tablet (850 mg total) by mouth 2 (two) times daily with a meal.   60 tablet   5   . tadalafil (CIALIS) 20 MG tablet      1/2-1 table prn, max 1 daily   3 tablet   0   . Testosterone (ANDROGEL) 20.25 MG/1.25GM (1.62%) GEL   Transdermal   Place 1 Squirt onto the skin daily. 1 pump per shoulder daily = 2 pumps daily   1.25 g   5     BP 148/91  Pulse 97  Temp(Src) 97.4 F (36.3 C)  Resp 17  SpO2 98%  Physical Exam  Nursing note and vitals reviewed. Constitutional: He appears well-developed and well-nourished. No distress.  HENT:  Head: Normocephalic and atraumatic.  Eyes: Conjunctivae are normal. Pupils are equal, round, and reactive to light.  Neck: Neck supple. No tracheal deviation present. No thyromegaly present.  Cardiovascular: Normal rate and regular rhythm.   No murmur heard. Pulmonary/Chest: Effort normal and breath sounds normal.  Abdominal: Soft. Bowel sounds are normal. He exhibits no distension. There is no tenderness.  Musculoskeletal: Normal range of motion. He exhibits no edema and no tenderness.  Neurological: He is alert. Coordination normal.  Skin: Skin is warm and dry. No rash noted.  There is a golf ball sized fluctuant area immediately posterior to the scrotum at left buttock  Psychiatric: He has a normal mood and affect.    ED Course  Procedures (including critical care time)  Labs Reviewed - No data to display No results found. Results for orders placed during the hospital encounter of 11/30/12  CBC      Result Value Range   WBC 7.4  4.0 - 10.5 K/uL    RBC 3.95 (*) 4.22 - 5.81 MIL/uL   Hemoglobin 13.9  13.0 - 17.0 g/dL   HCT 16.1  09.6 - 04.5 %   MCV 112.7 (*) 78.0 - 100.0 fL   MCH 35.2 (*) 26.0 - 34.0 pg   MCHC 31.2  30.0 - 36.0 g/dL   RDW 40.9  81.1 - 91.4 %   Platelets 209  150 - 400 K/uL  POCT I-STAT, CHEM 8      Result Value Range   Sodium 138  135 - 145 mEq/L   Potassium 3.5  3.5 - 5.1 mEq/L   Chloride 105  96 - 112 mEq/L   BUN 7  6 - 23 mg/dL   Creatinine, Ser 7.82  0.50 - 1.35 mg/dL   Glucose, Bld 956 (*) 70 - 99 mg/dL   Calcium, Ion 2.13  0.86 - 1.23 mmol/L   TCO2 22  0 - 100 mmol/L   Hemoglobin 13.6  13.0 - 17.0 g/dL   HCT 57.8  46.9 - 62.9 %   Dg Knee Complete 4 Views Left  11/10/2012   *RADIOLOGY REPORT*  Clinical Data: Bilateral knee pain greater on the left.  No trauma.  LEFT KNEE - COMPLETE 4+ VIEW  Comparison: 12/14/2007  Findings: Mild tricompartment degenerative changes.  IMPRESSION: Mild tricompartment degenerative changes.   Original Report Authenticated By: Lacy Duverney, M.D.   Dg Knee Complete 4 Views Right  11/10/2012   *RADIOLOGY REPORT*  Clinical Data: Bilateral knee pain greater on the left.  No trauma.  RIGHT KNEE - COMPLETE 4+ VIEW  Comparison: None.  Findings: Degenerative changes most prominent medial tibiofemoral joint space and patellofemoral joint space.  IMPRESSION: Degenerative changes most prominent medial tibiofemoral joint space and patellofemoral joint space.   Original Report Authenticated By: Lacy Duverney, M.D.   I wrote prescriptions for Dr. Luisa Hart for Percocet one Q4 hours when necessary pain dispense 30 and for Augmentin 875 one twice a day dispense 20  No diagnosis found.  9:20 AM pain improved after treatment with intravenous morphine 10:10 AM requesting more pain medicine additional morphine ordered. Spoke with Dr.Cornett who will come to evaluate MDM  Dr Luisa Hart to evaluate for I&D Diagnosis #1 perineal abscess #2hyperglycemia        Doug Sou, MD 11/30/12 1024  Doug Sou, MD 11/30/12 1208

## 2012-11-30 NOTE — ED Notes (Signed)
Waiting for Dr. Luisa Hart to return to room to do an I&D of  rectal abscess.

## 2012-12-02 ENCOUNTER — Ambulatory Visit (INDEPENDENT_AMBULATORY_CARE_PROVIDER_SITE_OTHER): Payer: Medicare Other | Admitting: Surgery

## 2012-12-02 ENCOUNTER — Encounter (INDEPENDENT_AMBULATORY_CARE_PROVIDER_SITE_OTHER): Payer: Self-pay | Admitting: Surgery

## 2012-12-02 VITALS — BP 132/76 | HR 68 | Temp 98.0°F | Resp 18 | Ht 76.0 in | Wt 278.0 lb

## 2012-12-02 DIAGNOSIS — Z9889 Other specified postprocedural states: Secondary | ICD-10-CM

## 2012-12-02 MED ORDER — HYDROCODONE-ACETAMINOPHEN 5-325 MG PO TABS: 1.0000 | ORAL_TABLET | Freq: Four times a day (QID) | ORAL | Status: DC | PRN

## 2012-12-02 NOTE — Patient Instructions (Signed)
Keep soaking.  Use advil  For pain as needed.

## 2012-12-02 NOTE — Progress Notes (Signed)
The patient returns to clinic. He was seen Monday in the last as long emergency room where his a perineal abscess was drained. Packing came out then he comes in for recheck. If the area is sore as well as his testicles and groin.  Exam: Incision and drainage site located on the left perineum is open and clean. No evidence of abscess. Testicles normal. Scrotum normal. No redness or fluctuance.  Impression: Status post incision and drainage of perineal abscess in the emergency room 2 days out  Plan: Return in 2 weeks for recheck. Finished Augmentin. The Percocet is too strong a pain medicine therefore given a prescription for Vicodin for pain. Continue hot tub soaks. Call with questions.

## 2012-12-04 ENCOUNTER — Telehealth: Payer: Self-pay | Admitting: Family Medicine

## 2012-12-04 NOTE — Telephone Encounter (Signed)
Patient's wife called and wanted to know why he has to wait for ou to sign the forms for him to get his knee brace. She states that he is in a lot of pain. He has an appointment to see Ortho. But it is jot until July 5th. That's because they requested to see Dr. Gerri Spore. CLs

## 2012-12-07 NOTE — Telephone Encounter (Signed)
Pull chart, not sure if we still have the forms of concern for braces

## 2012-12-07 NOTE — Telephone Encounter (Signed)
Chart is on your desk. CLS

## 2012-12-07 NOTE — Telephone Encounter (Signed)
I referred for further management of his pains.   I don't think the braces will do anything to help.  pls make sure we have him ortho appt.  i no longer have the brace forms.   He can use OTC knee sleeves or brace for now until he sees ortho.

## 2012-12-08 NOTE — Telephone Encounter (Signed)
Patient is requesting that you fill those forms out for the knee brace. He said that he needs it it helps him. He said his brace he has is old. He wants the forms filled out ASAP. CLS

## 2012-12-08 NOTE — Telephone Encounter (Signed)
lmom to cb. cls 

## 2012-12-09 NOTE — Telephone Encounter (Signed)
pls submit forms

## 2012-12-09 NOTE — Telephone Encounter (Signed)
I fax over form to receive knee brace per Crosby Oyster PA-C. CLS

## 2012-12-14 ENCOUNTER — Other Ambulatory Visit: Payer: Self-pay | Admitting: Medical

## 2012-12-14 ENCOUNTER — Ambulatory Visit (INDEPENDENT_AMBULATORY_CARE_PROVIDER_SITE_OTHER): Payer: Medicare Other | Admitting: Medical

## 2012-12-14 ENCOUNTER — Encounter: Payer: Self-pay | Admitting: Medical

## 2012-12-14 VITALS — BP 170/110 | HR 92 | Temp 98.4°F | Resp 16 | Wt 278.0 lb

## 2012-12-14 DIAGNOSIS — E119 Type 2 diabetes mellitus without complications: Secondary | ICD-10-CM | POA: Diagnosis not present

## 2012-12-14 DIAGNOSIS — R030 Elevated blood-pressure reading, without diagnosis of hypertension: Secondary | ICD-10-CM

## 2012-12-14 DIAGNOSIS — M2390 Unspecified internal derangement of unspecified knee: Secondary | ICD-10-CM | POA: Diagnosis not present

## 2012-12-14 DIAGNOSIS — N529 Male erectile dysfunction, unspecified: Secondary | ICD-10-CM

## 2012-12-14 DIAGNOSIS — M1710 Unilateral primary osteoarthritis, unspecified knee: Secondary | ICD-10-CM | POA: Diagnosis not present

## 2012-12-14 DIAGNOSIS — M25469 Effusion, unspecified knee: Secondary | ICD-10-CM

## 2012-12-14 DIAGNOSIS — M25461 Effusion, right knee: Secondary | ICD-10-CM

## 2012-12-14 DIAGNOSIS — M1712 Unilateral primary osteoarthritis, left knee: Secondary | ICD-10-CM

## 2012-12-14 DIAGNOSIS — M1711 Unilateral primary osteoarthritis, right knee: Secondary | ICD-10-CM

## 2012-12-14 DIAGNOSIS — M238X9 Other internal derangements of unspecified knee: Secondary | ICD-10-CM

## 2012-12-14 MED ORDER — LISINOPRIL 10 MG PO TABS: 10.0000 mg | ORAL_TABLET | Freq: Every day | ORAL | Status: DC

## 2012-12-14 MED ORDER — HYDROCODONE-ACETAMINOPHEN 5-325 MG PO TABS: 1.0000 | ORAL_TABLET | Freq: Four times a day (QID) | ORAL | Status: DC | PRN

## 2012-12-14 MED ORDER — SILDENAFIL CITRATE 50 MG PO TABS: 50.0000 mg | ORAL_TABLET | Freq: Every day | ORAL | Status: DC | PRN

## 2012-12-14 NOTE — Progress Notes (Signed)
Subjective: Here for general recheck.  Diabetes - he notes checking glucose on and off.  Gets 160-165s, sometimes 180u QHS.  Since last visit, has been eating less white bread, less sweets.  checks feet daily.  Has seen eye doctor in the last year.   Using Metformin once daily, Lantus 10u QHS.  diagnosed with diabetes in the 1990s.    He denies hx/o hypertension.  Doesn't check BPs.  No chest pain, vision changes, edema, DOE.  He states his BP is elevated due to working out this morning.   He notes knee pains since 1990s after slipping in grease.  Has knee issues since.  Had several steroid injections in the knees initially, states this is what gave him diabetes.  Wants refill on pain medication today.  Has ortho visit scheduled in July.  Still wants to try knee braces to help his pains.  ED - has trouble getting and keeping erections.   cialis works, but too expensive.  No prior eval for cardiac issues.    He is exercising some with swimming.    Past Medical History  Diagnosis Date  . Dyslipidemia   . Arthritis   . Diabetes mellitus     07/2006 with HgbA1C of 7.5%  . Hypogonadism male    Family medication history: Mother - alive, healthy Father - died in hit and run Has 9 sisters, 1 brother, brother died in MVA.  Sisters are healthy  Objective: Filed Vitals:   12/14/12 1502  BP: 170/110  Pulse: 92  Temp: 98.4 F (36.9 C)  Resp: 16    General appearance: alert, no distress, WD/WN Neck: supple, no lymphadenopathy, no thyromegaly, no masses Heart: RRR, normal S1, S2, no murmurs Lungs: CTA bilaterally, no wheezes, rhonchi, or rales Abdomen: +bs, soft, non tender, non distended, no masses, no hepatomegaly, no splenomegaly Pulses: 2+ symmetric, upper and lower extremities, normal cap refill Ext: no edema     Adult ECG Report  Indication: elevated BP  Rate: 97bpm  Rhythm: normal sinus rhythm  QRS Axis: 71 degrees  PR Interval:  QRS Duration: 82ms  QTc:  Conduction Disturbances: borderline QT prolongation  Other Abnormalities: LVH criteria  Patient's cardiac risk factors are: advanced age (older than 69 for men, 64 for women), diabetes mellitus, dyslipidemia, hypertension and male gender.  EKG comparison: none  Narrative Interpretation: atrial enlargement, LVH, borderline QT prolongation    Assessment: Encounter Diagnoses  Name Primary?  . Degenerative joint disease of knee, left Yes  . Degenerative joint disease of knee, right   . Knee joint laxity, unspecified laterality   . Bilateral knee swelling   . Type II or unspecified type diabetes mellitus without mention of complication, not stated as uncontrolled   . Elevated blood pressure reading without diagnosis of hypertension   . Erectile dysfunction    Plan: Knee pains - refilled pain medication, will look back over order for braces, f/u with orthopedics as planned  Diabetes - hgbA1C at goal.  C/t same medications, healthy diet.  Unfortunately I don't think he is understanding that his diabetes is a chronic disease that won't just resolve.  Discussed the diagnosis, possible complications, compliance.  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     Follow-up pending labs, call back.

## 2012-12-15 ENCOUNTER — Telehealth: Payer: Self-pay | Admitting: Family Medicine

## 2012-12-15 NOTE — Telephone Encounter (Signed)
Message copied by Janeice Robinson on Tue Dec 15, 2012  1:59 PM ------      Message from: Aleen Campi, DAVID S      Created: Mon Dec 14, 2012 10:39 PM       After reviewing his EKG, I do want to refer to cardiology. I also want to start him on medication to lower his blood pressure which can also protect his kidneys.  Its called Lisionpril, taken once daily.   ------

## 2012-12-15 NOTE — Telephone Encounter (Signed)
Patients wife is aware of the new medication that the physician wants him to start taking. CLS

## 2012-12-15 NOTE — Telephone Encounter (Signed)
Patient is aware of his appointment to see the cardiologist on 02/11/2013 @ 230 pm with Dr. Newell Coral at [facility] cardiology. CLS [phone]

## 2012-12-15 NOTE — Addendum Note (Signed)
Addended by: Janeice Robinson on: 12/15/2012 08:55 AM   Modules accepted: Orders

## 2012-12-21 ENCOUNTER — Other Ambulatory Visit: Payer: Medicare Other

## 2012-12-21 ENCOUNTER — Telehealth: Payer: Self-pay | Admitting: Internal Medicine

## 2012-12-21 DIAGNOSIS — N529 Male erectile dysfunction, unspecified: Secondary | ICD-10-CM | POA: Diagnosis not present

## 2012-12-21 DIAGNOSIS — E119 Type 2 diabetes mellitus without complications: Secondary | ICD-10-CM

## 2012-12-21 LAB — COMPREHENSIVE METABOLIC PANEL
ALT: 13 U/L (ref 0–53)
AST: 13 U/L (ref 0–37)
Alkaline Phosphatase: 49 U/L (ref 39–117)
BUN: 9 mg/dL (ref 6–23)
Calcium: 9 mg/dL (ref 8.4–10.5)
Chloride: 105 mEq/L (ref 96–112)
Creat: 0.87 mg/dL (ref 0.50–1.35)
Total Bilirubin: 0.9 mg/dL (ref 0.3–1.2)

## 2012-12-21 LAB — LIPID PANEL
Cholesterol: 181 mg/dL (ref 0–200)
HDL: 39 mg/dL — ABNORMAL LOW (ref >39)
LDL Cholesterol: 118 mg/dL — ABNORMAL HIGH (ref 0–99)
Total CHOL/HDL Ratio: 4.6 Ratio
Triglycerides: 119 mg/dL (ref <150)
VLDL: 24 mg/dL (ref 0–40)

## 2012-12-21 MED ORDER — TADALAFIL 20 MG PO TABS: 20.0000 mg | ORAL_TABLET | Freq: Every day | ORAL | Status: DC | PRN

## 2012-12-21 NOTE — Telephone Encounter (Signed)
shane has signed an order for knee orthosis with adjustable knee joints from [facility]

## 2012-12-21 NOTE — Telephone Encounter (Signed)
pls get me handicap form, and complete as much as you can regarding temporary handicap placard.

## 2012-12-21 NOTE — Telephone Encounter (Signed)
Patient came stating that he needs a handicap sticker. I told him that I would have to send you a message and we would call him back. CLS

## 2012-12-21 NOTE — Telephone Encounter (Signed)
Lmom to cb. CLS 

## 2012-12-22 LAB — MICROALBUMIN / CREATININE URINE RATIO: Microalb Creat Ratio: 6.6 mg/g (ref 0.0–30.0)

## 2012-12-22 NOTE — Telephone Encounter (Signed)
Patient is aware of his handicap form is ready for pick up. CLS

## 2012-12-24 ENCOUNTER — Other Ambulatory Visit: Payer: Self-pay | Admitting: Medical

## 2012-12-24 MED ORDER — ATORVASTATIN CALCIUM 40 MG PO TABS: 40.0000 mg | ORAL_TABLET | Freq: Every day | ORAL | Status: DC

## 2012-12-28 ENCOUNTER — Other Ambulatory Visit: Payer: Self-pay | Admitting: Medical

## 2013-01-14 DIAGNOSIS — M1710 Unilateral primary osteoarthritis, unspecified knee: Secondary | ICD-10-CM | POA: Diagnosis not present

## 2013-02-11 ENCOUNTER — Institutional Professional Consult (permissible substitution): Payer: Medicare Other | Admitting: Cardiology

## 2013-02-15 ENCOUNTER — Other Ambulatory Visit: Payer: Self-pay | Admitting: Medical

## 2013-02-15 ENCOUNTER — Telehealth: Payer: Self-pay | Admitting: Medical

## 2013-02-15 MED ORDER — METFORMIN HCL 850 MG PO TABS: 850.0000 mg | ORAL_TABLET | Freq: Two times a day (BID) | ORAL | Status: DC

## 2013-02-15 NOTE — Telephone Encounter (Signed)
Pt needs refill on metformin sent to [facility] on [address]

## 2013-04-14 ENCOUNTER — Telehealth: Payer: Self-pay | Admitting: Medical

## 2013-04-14 ENCOUNTER — Ambulatory Visit (INDEPENDENT_AMBULATORY_CARE_PROVIDER_SITE_OTHER): Payer: Medicare Other | Admitting: Cardiology

## 2013-04-14 ENCOUNTER — Encounter: Payer: Self-pay | Admitting: Cardiology

## 2013-04-14 VITALS — BP 164/100 | HR 92 | Ht 76.0 in | Wt 278.0 lb

## 2013-04-14 DIAGNOSIS — R0609 Other forms of dyspnea: Secondary | ICD-10-CM

## 2013-04-14 DIAGNOSIS — R0600 Dyspnea, unspecified: Secondary | ICD-10-CM

## 2013-04-14 DIAGNOSIS — I10 Essential (primary) hypertension: Secondary | ICD-10-CM | POA: Diagnosis not present

## 2013-04-14 DIAGNOSIS — E785 Hyperlipidemia, unspecified: Secondary | ICD-10-CM

## 2013-04-14 MED ORDER — VARDENAFIL HCL 20 MG PO TABS: 20.0000 mg | ORAL_TABLET | Freq: Every day | ORAL | Status: DC | PRN

## 2013-04-14 MED ORDER — HYDROCODONE-ACETAMINOPHEN 5-500 MG PO TABS: 1.0000 | ORAL_TABLET | Freq: Four times a day (QID) | ORAL | Status: DC | PRN

## 2013-04-14 NOTE — Patient Instructions (Signed)
Start back on lisinopril 10 mg daily.  We will recheck your blood pressure in 2 weeks.  Once your blood pressure is controlled we will schedule you for a nuclear stress test. Eugenie Birks myoview )

## 2013-04-14 NOTE — Telephone Encounter (Signed)
Please call patient would like another cialis sample

## 2013-04-14 NOTE — Progress Notes (Signed)
Jerry [name] Date of Birth: [birth year] Medical Record #[id number]  History of Present Illness: Jerry [name] is seen at the request of Jerry Oyster PA for evaluation of an abnormal ECG and hypertension. He is a pleasant 57 year old black male who has a history of diabetes mellitus type 2 for the past 10 years. He also has a history of chronic hypertension. He previously was taking lisinopril for this but hasn't been taking any medication for the past 3 months. He reports that his blood pressure was previously controlled. He also has a history of hypercholesterolemia. He states that over this past weekend he was eating a lot of pork skins while watching football games. He thinks this has contributed to his high blood pressure today. He does walk regularly about 10 blocks. He does feel sweaty and tired with this. He also gets winded easily when doing household chores. He denies any true chest pain. He has had no formal cardiac evaluation in the past.  Current Outpatient Prescriptions on File Prior to Visit  Medication Sig Dispense Refill  . Insulin Glargine (LANTUS SOLOSTAR) 100 UNIT/ML SOPN Inject 10 Units into the skin at bedtime.      . metFORMIN (GLUCOPHAGE) 850 MG tablet Take 1 tablet (850 mg total) by mouth 2 (two) times daily with a meal.  60 tablet  4   No current facility-administered medications on file prior to visit.    No Known Allergies  Past Medical History  Diagnosis Date  . Dyslipidemia   . Arthritis   . Diabetes mellitus     07/2006 with HgbA1C of 7.5%  . Hypogonadism male   . HTN (hypertension)     Past Surgical History  Procedure Laterality Date  . Incision and drainage perirectal abscess    . Knee surgery      left knee - remote past  . Colonoscopy  08/2006    Dr. Russella Dar - repeat 2018    History  Smoking status  . Former Smoker  . Types: Cigarettes  . Quit date: 11/11/2001  Smokeless tobacco  . Never Used    History  Alcohol Use No    Family  History  Problem Relation Age of Onset  . Diabetes Mother   . Other Father     death by MVA  . Other Brother     died of MVA  . Stroke Neg Hx   . Heart disease Neg Hx   . Cancer Neg Hx   . Hypertension Neg Hx     Review of Systems: As noted in history of present illness. He does have history of ED.   His walking is limited by chronic degenerative changes in his knees. All other systems were reviewed and are negative.  Physical Exam: BP 164/100  Pulse 92  Ht 6\' 4"  (1.93 m)  Wt 278 lb (126.1 kg)  BMI 33.85 kg/m2 He is a pleasant black male in no acute distress. He is of large stature.  HEENT: Normocephalic, atraumatic. Pupils equal round and react to light accommodation. Extraocular movements are full. Oropharynx is clear. Neck is supple without JVD, adenopathy, thyromegaly, or bruits. Lungs: Clear Cardiovascular: Regular rate and rhythm. Normal S1 and S2. Positive S4. No murmur or S3. Abdomen: Soft and nontender. Bowel sounds are positive. No hepatosplenomegaly or masses. Extremities: No cyanosis or edema. Pulses are 2+ and symmetric. Skin: Warm and dry Neuro: Alert and oriented x3. Cranial nerves II through XII are intact. Gait is slow because of arthritic  knees.   LABORATORY DATA:  ECG demonstrates normal sinus rhythm. He has LVH by voltage with nonspecific T-wave abnormality.   Assessment / Plan: 1. Mild dyspnea on exertion and fatigue on exertion. I suspect his symptoms are predominantly related to his poorly controlled hypertension. Need to rule out ischemia in a patient who has multiple cardiac risk factors. I recommended a Lexiscan Myoview study once his blood pressure is controlled.  2. Hypertension-poorly controlled. 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 We will resume lisinopril 10 mg daily. I recommended a low-sodium diet. We will repeat a blood pressure check in 2 weeks. If satisfactory we will proceed with stress testing.  3. Diabetes mellitus type  2  4. Hyperlipidemia. We reviewed his last laboratory data. His cholesterol has improved with dietary therapy. I have stressed a heart healthy diet.

## 2013-04-14 NOTE — Telephone Encounter (Signed)
lm

## 2013-04-15 ENCOUNTER — Telehealth: Payer: Self-pay

## 2013-04-15 MED ORDER — HYDROCODONE-ACETAMINOPHEN 5-325 MG PO TABS: 1.0000 | ORAL_TABLET | Freq: Four times a day (QID) | ORAL | Status: DC | PRN

## 2013-04-15 NOTE — Telephone Encounter (Signed)
Received a call from [facility] on [address] hydrocodone 5/500 mg not preferred any longer.Hydrocodone 5/325 mg prescription given to patient .

## 2013-04-28 ENCOUNTER — Ambulatory Visit (INDEPENDENT_AMBULATORY_CARE_PROVIDER_SITE_OTHER): Payer: Medicare Other | Admitting: *Deleted

## 2013-04-28 ENCOUNTER — Encounter: Payer: Self-pay | Admitting: *Deleted

## 2013-04-28 VITALS — BP 152/88 | HR 100 | Ht 76.0 in | Wt 278.5 lb

## 2013-04-28 DIAGNOSIS — I10 Essential (primary) hypertension: Secondary | ICD-10-CM | POA: Diagnosis not present

## 2013-04-28 MED ORDER — LISINOPRIL 20 MG PO TABS: 20.0000 mg | ORAL_TABLET | Freq: Every day | ORAL | Status: DC

## 2013-04-28 NOTE — Progress Notes (Signed)
Pt in for BP check, pt was started on Lisinopril 10 mg once daily. BP left arm 152/88, right arm 158/86 BP were taken manually; pulse 100 beats/minute. Pt took his BP medications this AM. Pt has no  C/O at this time.Dr. Swaziland aware of BP reading and recommended to increased his Lisinopril to 20 mg once a day. Prescription sent to [facility] pharmacy. Pt aware to take 2/10 mg Lisinopril till he gets the new prescription of 20 mg pills. Pt verbalized understanding.

## 2013-05-27 ENCOUNTER — Encounter (HOSPITAL_COMMUNITY): Payer: Medicare Other

## 2013-06-07 ENCOUNTER — Encounter: Payer: Self-pay | Admitting: Cardiology

## 2013-06-07 ENCOUNTER — Ambulatory Visit (HOSPITAL_COMMUNITY): Payer: Medicare Other | Attending: Cardiology | Admitting: Radiology

## 2013-06-07 VITALS — BP 145/84 | HR 88 | Ht 76.0 in | Wt 278.0 lb

## 2013-06-07 DIAGNOSIS — I10 Essential (primary) hypertension: Secondary | ICD-10-CM | POA: Diagnosis not present

## 2013-06-07 DIAGNOSIS — Z9889 Other specified postprocedural states: Secondary | ICD-10-CM

## 2013-06-07 DIAGNOSIS — R42 Dizziness and giddiness: Secondary | ICD-10-CM | POA: Insufficient documentation

## 2013-06-07 DIAGNOSIS — R9431 Abnormal electrocardiogram [ECG] [EKG]: Secondary | ICD-10-CM | POA: Diagnosis not present

## 2013-06-07 DIAGNOSIS — R0602 Shortness of breath: Secondary | ICD-10-CM

## 2013-06-07 DIAGNOSIS — Z87891 Personal history of nicotine dependence: Secondary | ICD-10-CM | POA: Diagnosis not present

## 2013-06-07 DIAGNOSIS — Z8249 Family history of ischemic heart disease and other diseases of the circulatory system: Secondary | ICD-10-CM | POA: Insufficient documentation

## 2013-06-07 DIAGNOSIS — E785 Hyperlipidemia, unspecified: Secondary | ICD-10-CM

## 2013-06-07 DIAGNOSIS — R0600 Dyspnea, unspecified: Secondary | ICD-10-CM

## 2013-06-07 HISTORY — DX: Other specified postprocedural states: Z98.890

## 2013-06-07 MED ORDER — REGADENOSON 0.4 MG/5ML IV SOLN
0.4000 mg | Freq: Once | INTRAVENOUS | Status: AC
  Administered 2013-06-07: 0.4 mg via INTRAVENOUS

## 2013-06-07 MED ORDER — TECHNETIUM TC 99M SESTAMIBI GENERIC - CARDIOLITE
11.0000 | Freq: Once | INTRAVENOUS | Status: AC | PRN
  Administered 2013-06-07: 11 via INTRAVENOUS

## 2013-06-07 MED ORDER — TECHNETIUM TC 99M SESTAMIBI GENERIC - CARDIOLITE
33.0000 | Freq: Once | INTRAVENOUS | Status: AC | PRN
  Administered 2013-06-07: 33 via INTRAVENOUS

## 2013-06-07 NOTE — Progress Notes (Signed)
[facility] SITE 3 NUCLEAR MED [address] [place], [place] [address] [phone]    Cardiology Nuclear Med Study  Jerry [name] is a 57 y.o. male     MRN : [id number]     DOB: 1955/07/23  Procedure Date: 06/07/2013  Nuclear Med Background Indication for Stress Test:  Evaluation for Ischemia and Abnormal EKG History:  No known CAD Cardiac Risk Factors: Family History - CAD, History of Smoking and Hypertension  Symptoms:  Dizziness   Nuclear Pre-Procedure Caffeine/Decaff Intake:  9:00pm NPO After: 7:00pm   Lungs:  clear O2 Sat: 96% on room air. IV 0.9% NS with Angio Cath:  22g  IV Site: R Hand  IV Started by:  Cathlyn Parsons, RN  Chest Size (in):  52 Cup Size: n/a  Height: 6\' 4"  (1.93 m)  Weight:  278 lb (126.1 kg)  BMI:  Body mass index is 33.85 kg/(m^2). Tech Comments: n/a    Nuclear Med Study 1 or 2 day study: 1 day  Stress Test Type:  Treadmill/Lexiscan  Reading MD: Willa Rough, MD  Order Authorizing Provider:  Vonna Drafts  Resting Radionuclide: Technetium 69m Sestamibi  Resting Radionuclide Dose: 11.0 mCi   Stress Radionuclide:  Technetium 8m Sestamibi  Stress Radionuclide Dose: 33.0 mCi           Stress Protocol Rest HR: 88 Stress HR: 139  Rest BP: 145/84 Stress BP: 208/97  Exercise Time (min): n/a METS: n/a   Predicted Max HR: 163 bpm % Max HR: 85.28 bpm Rate Pressure Product: 21308   Dose of Adenosine (mg):  n/a Dose of Lexiscan: 0.4 mg  Dose of Atropine (mg): n/a Dose of Dobutamine: n/a mcg/kg/min (at max HR)  Stress Test Technologist: Nelson Chimes, BS-ES  Nuclear Technologist:  Doyne Keel, CNMT     Rest Procedure:  Myocardial perfusion imaging was performed at rest 45 minutes following the intravenous administration of Technetium 45m Sestamibi. Rest ECG: Nonspecific ST-T wave abnormalities  Stress Procedure:  The patient received IV Lexiscan 0.4 mg over 15-seconds with concurrent low level exercise and then Technetium 51m  Sestamibi was injected at 30-seconds while the patient continued walking one more minute.  Quantitative spect images were obtained after a 45-minute delay.  Stress ECG: No significant change from baseline ECG  QPS Raw Data Images:  Normal; no motion artifact; normal heart/lung ratio. Stress Images:  There is a medium sized area of moderate decreased uptake affecting base/mid/apical inferior, apical cap, apical anterior segments. The defects are mixed with some scar in some ischemia. Rest Images:  Small area of moderate decreased uptake at the mid/apical inferior segments. Subtraction (SDS):  There is mild ischemia at the apical cap, base the inferior, and apical anterior segments. Transient Ischemic Dilatation (Normal <1.22):  1.21 Lung/Heart Ratio (Normal <0.45):  0.32  Quantitative Gated Spect Images QGS EDV:  163 ml QGS ESV:  93 ml  Impression Exercise Capacity:  Lexiscan with low level exercise. BP Response:  There was a hypertensive response to the infusion Clinical Symptoms:  No significant symptoms noted. ECG Impression:  No significant ST segment change suggestive of ischemia. Comparison with Prior Nuclear Study: No previous nuclear study performed  Overall Impression:  The study is abnormal. There are mild wall motion abnormalities. There is mild scar at the mid/apical inferior segments. There is mild ischemia at the base inferior, apical cap, and apical anterior segments. This is a moderate risk scan..  LV Ejection Fraction: 43%.  LV Wall Motion:  Mild global hypokinesis and mild hypokinesis of the septum.  Willa Rough

## 2013-06-08 ENCOUNTER — Ambulatory Visit: Payer: Medicare Other | Admitting: Nurse Practitioner

## 2013-06-08 ENCOUNTER — Other Ambulatory Visit: Payer: Self-pay

## 2013-06-08 DIAGNOSIS — Z01812 Encounter for preprocedural laboratory examination: Secondary | ICD-10-CM

## 2013-06-08 DIAGNOSIS — Z79899 Other long term (current) drug therapy: Secondary | ICD-10-CM

## 2013-06-08 DIAGNOSIS — R9431 Abnormal electrocardiogram [ECG] [EKG]: Secondary | ICD-10-CM

## 2013-06-08 DIAGNOSIS — I10 Essential (primary) hypertension: Secondary | ICD-10-CM

## 2013-06-08 DIAGNOSIS — E119 Type 2 diabetes mellitus without complications: Secondary | ICD-10-CM

## 2013-06-08 DIAGNOSIS — R0600 Dyspnea, unspecified: Secondary | ICD-10-CM

## 2013-06-08 DIAGNOSIS — R079 Chest pain, unspecified: Secondary | ICD-10-CM

## 2013-06-08 DIAGNOSIS — R9439 Abnormal result of other cardiovascular function study: Secondary | ICD-10-CM

## 2013-06-08 MED ORDER — CARVEDILOL 6.25 MG PO TABS: 6.2500 mg | ORAL_TABLET | Freq: Two times a day (BID) | ORAL | Status: DC

## 2013-06-09 ENCOUNTER — Ambulatory Visit
Admission: RE | Admit: 2013-06-09 | Discharge: 2013-06-09 | Disposition: A | Discharge status: Home / Self Care | Payer: Medicare Other | Source: Ambulatory Visit | Attending: Cardiology | Admitting: Cardiology

## 2013-06-09 ENCOUNTER — Encounter (HOSPITAL_COMMUNITY): Payer: Self-pay | Admitting: Pharmacy Technician

## 2013-06-09 ENCOUNTER — Encounter: Payer: Self-pay | Admitting: Nurse Practitioner

## 2013-06-09 ENCOUNTER — Ambulatory Visit (INDEPENDENT_AMBULATORY_CARE_PROVIDER_SITE_OTHER): Payer: Medicare Other | Admitting: Nurse Practitioner

## 2013-06-09 ENCOUNTER — Other Ambulatory Visit: Payer: Self-pay | Admitting: Nurse Practitioner

## 2013-06-09 VITALS — BP 160/100 | HR 88 | Ht 76.0 in | Wt 283.2 lb

## 2013-06-09 DIAGNOSIS — R9439 Abnormal result of other cardiovascular function study: Secondary | ICD-10-CM

## 2013-06-09 DIAGNOSIS — I10 Essential (primary) hypertension: Secondary | ICD-10-CM

## 2013-06-09 DIAGNOSIS — E119 Type 2 diabetes mellitus without complications: Secondary | ICD-10-CM | POA: Diagnosis not present

## 2013-06-09 DIAGNOSIS — R0609 Other forms of dyspnea: Secondary | ICD-10-CM | POA: Diagnosis not present

## 2013-06-09 DIAGNOSIS — R9431 Abnormal electrocardiogram [ECG] [EKG]: Secondary | ICD-10-CM

## 2013-06-09 DIAGNOSIS — R079 Chest pain, unspecified: Secondary | ICD-10-CM

## 2013-06-09 DIAGNOSIS — R0600 Dyspnea, unspecified: Secondary | ICD-10-CM

## 2013-06-09 DIAGNOSIS — R0989 Other specified symptoms and signs involving the circulatory and respiratory systems: Secondary | ICD-10-CM | POA: Diagnosis not present

## 2013-06-09 DIAGNOSIS — Z01810 Encounter for preprocedural cardiovascular examination: Secondary | ICD-10-CM | POA: Diagnosis not present

## 2013-06-09 DIAGNOSIS — Z79899 Other long term (current) drug therapy: Secondary | ICD-10-CM

## 2013-06-09 DIAGNOSIS — Z01812 Encounter for preprocedural laboratory examination: Secondary | ICD-10-CM

## 2013-06-09 MED ORDER — LOSARTAN POTASSIUM 100 MG PO TABS: 100.0000 mg | ORAL_TABLET | Freq: Every day | ORAL | Status: DC

## 2013-06-09 NOTE — Progress Notes (Signed)
[name] Date of Birth: 1955-07-22 Medical Record #[id number]  History of Present Illness: Mr. Mcgroarty is seen back today for a pre cath visit. Seen for Dr. Swaziland. Was seen here back in early October for an abnormal EKG - has HTN, DM and HLD. No true chest pain. Referred for Myoview - results below - now referred for cath.  Comes in today. Here with his wife. Will have some chest aching with exertion and some DOE. Has not picked up his aspirin or Coreg. Has a cough with the Lisinopril and has not had any since Monday. BP is up. Asking for pain medicine as well. Not able to tell me what his A1C is.   Current Outpatient Prescriptions  Medication Sig Dispense Refill  . aspirin EC 81 MG tablet Take 1 tablet (81 mg total) by mouth daily.  30 tablet  6  . HYDROcodone-acetaminophen (NORCO/VICODIN) 5-325 MG per tablet Take 2 tablets by mouth 2 (two) times daily as needed (knee pain).      . Insulin Glargine (LANTUS SOLOSTAR) 100 UNIT/ML SOPN Inject 10 Units into the skin at bedtime.      Marland Kitchen lisinopril (PRINIVIL,ZESTRIL) 20 MG tablet Take 20 mg by mouth daily.      . metFORMIN (GLUCOPHAGE) 850 MG tablet Take 850 mg by mouth 2 (two) times daily with a meal.      . carvedilol (COREG) 6.25 MG tablet Take 1 tablet (6.25 mg total) by mouth 2 (two) times daily.  60 tablet  6   No current facility-administered medications for this visit.    No Known Allergies  Past Medical History  Diagnosis Date  . Dyslipidemia   . Arthritis   . Diabetes mellitus     07/2006 with HgbA1C of 7.5%  . Hypogonadism male   . HTN (hypertension)     Past Surgical History  Procedure Laterality Date  . Incision and drainage perirectal abscess    . Knee surgery      left knee - remote past  . Colonoscopy  08/2006    Dr. Russella Dar - repeat 2018    History  Smoking status  . Former Smoker  . Types: Cigarettes  . Quit date: 11/11/2001  Smokeless tobacco  . Never Used    History  Alcohol Use No    Family  History  Problem Relation Age of Onset  . Diabetes Mother   . Other Father     death by MVA  . Other Brother     died of MVA  . Stroke Neg Hx   . Heart disease Neg Hx   . Cancer Neg Hx   . Hypertension Neg Hx     Review of Systems: The review of systems is per the HPI.  All other systems were reviewed and are negative.  Physical Exam: BP 160/100  Pulse 88  Ht 6\' 4"  (1.93 m)  Wt 283 lb 3.2 oz (128.459 kg)  BMI 34.49 kg/m2 Patient is very pleasant and in no acute distress. He is obese. Skin is warm and dry. Color is normal.  HEENT is unremarkable. Normocephalic/atraumatic. PERRL. Sclera are nonicteric. Neck is supple. No masses. No JVD. Lungs are clear. Cardiac exam shows a regular rate and rhythm. Abdomen is soft. Extremities are without edema. Gait and ROM are intact. No gross neurologic deficits noted.  LABORATORY DATA: PENDING  Lab Results  Component Value Date   WBC 7.4 11/30/2012   HGB 13.6 11/30/2012   HCT 40.0 11/30/2012  PLT 209 11/30/2012   GLUCOSE 140* 12/21/2012   CHOL 181 12/21/2012   TRIG 119 12/21/2012   HDL 39* 12/21/2012   LDLCALC 118* 12/21/2012   ALT 13 12/21/2012   AST 13 12/21/2012   NA 140 12/21/2012   K 4.3 12/21/2012   CL 105 12/21/2012   CREATININE 0.87 12/21/2012   BUN 9 12/21/2012   CO2 24 12/21/2012   TSH 1.304 11/15/2011   PSA 2.20 11/12/2011   HGBA1C 9.1* 11/12/2011   MICROALBUR 0.50 12/21/2012    Exercise Capacity: Lexiscan with low level exercise.  BP Response: There was a hypertensive response to the infusion  Clinical Symptoms: No significant symptoms noted.  ECG Impression: No significant ST segment change suggestive of ischemia.  Comparison with Prior Nuclear Study: No previous nuclear study performed  Overall Impression: The study is abnormal. There are mild wall motion abnormalities. There is mild scar at the mid/apical inferior segments. There is mild ischemia at the base inferior, apical cap, and apical anterior segments. This is a moderate risk  scan..  LV Ejection Fraction: 43%. LV Wall Motion: Mild global hypokinesis and mild hypokinesis of the septum.  Willa Rough   Assessment / Plan: 1. Abnormal Myoview - proceed on with cardiac cath - the procedure, risks and benefits have been reviewed and he is willing to proceed on Friday. Checking labs and CXR today. Holding metformin after tonight's dose.  2. HTN - not on his medicines. Reports cough with ACE. I have stopped the Lisinopril. Adding Losartan 100 mg a day. He is to take with the Coreg. Samples of aspirin given here today.   Patient is agreeable to this plan and will call if any problems develop in the interim.   Rosalio Macadamia, RN, ANP-C [facility] [address] [place], [place]  [address]

## 2013-06-09 NOTE — Patient Instructions (Addendum)
Do NOT pick up the Lisinopril  Pick up the Coreg, aspirin and the Losartan when you leave here today  Follow this medicine list  We need to check labs today downstairs  Please go to [facility] for your CXR - you may walk in.   You are scheduled for a cardiac catheterization on Friday, December 5th at 12 noon with Dr. Swaziland or associate.  Go to [facility] Short Stay on Friday, December 5th at 10 am.  Enter thru the [employer] entrance A No food or drink after midnight on Thursday. You may take your medications with a sip of water on the day of your procedure.   No more Metformin after tonight's dose  Half dose of your insulin Thursday night  Call the [facility] office at [phone] if you have any questions, problems or concerns.    Coronary Angiography Coronary angiography is an X-ray procedure used to look at the arteries in the heart. In this procedure, a dye (contrast dye) is injected through a long, hollow tube (catheter). The catheter is about the size of a piece of cooked spaghetti and is inserted through your groin, wrist, or arm. The dye is injected into each artery, and X-rays are then taken to show if there is a blockage in the arteries of your heart. LET [facility] CARE PROVIDER KNOW ABOUT:  Any allergies you have, including allergies to shellfish or contrast dye.   All medicines you are taking, including vitamins, herbs, eye drops, creams, and over-the-counter medicines.   Previous problems you or members of your family have had with the use of anesthetics.   Any blood disorders you have.   Previous surgeries you have had.  History of kidney problems or failure.   Other medical conditions you have. RISKS AND COMPLICATIONS  Generally, coronary angiography is a safe procedure. However, as with any procedure, complications can occur. Possible complications include:  Allergic reaction to the dye.  Bleeding  from the access site or other locations.  Kidney injury, especially in people with impaired kidney function.  Stroke (rare).  Heart attack (rare). BEFORE THE PROCEDURE   Do not eat or drink anything after midnight the night before the procedure, or as directed by your health care provider.   Ask your health care provider if it is okay to take any needed medicines with a sip of water.  PROCEDURE  You may be given a medicine to help you relax (sedative) before the procedure. This medicine is given through an intravenous (IV) access tube that is inserted into one of your veins.   The area where the catheter will be inserted is washed and shaved. This is usually done in the groin but may be done in the fold of your arm (near your elbow) or in the wrist.   A medicine will be given to numb the area where the catheter will be inserted (local anesthetic).   The health care provider will insert the catheter into an artery. The catheter is guided by using a special type of X-ray (fluoroscopy) of the blood vessel being examined.   A special dye is then injected into the catheter, and X-rays are taken. The dye helps to show where any narrowing or blockages are located in the heart arteries.  AFTER THE PROCEDURE   If the procedure is done through the leg, you will be kept in bed lying flat for several hours. You will be instructed to not  bend or cross your legs.  The insertion site will be checked frequently.   The pulse in your feet or wrist will be checked frequently.   Additional blood tests, X-rays, and an electrocardiogram may be done.   You may need to stay in the hospital overnight for observation.  Document Released: 12/29/2002 Document Revised: 02/24/2013 Document Reviewed: 11/16/2012 [facility] Patient Information 2014 [place], [place].

## 2013-06-11 ENCOUNTER — Encounter (HOSPITAL_COMMUNITY)
Admission: RE | Disposition: A | Discharge status: Home / Self Care | Payer: Self-pay | Source: Ambulatory Visit | Attending: Cardiology

## 2013-06-11 ENCOUNTER — Ambulatory Visit (HOSPITAL_COMMUNITY)
Admission: RE | Admit: 2013-06-11 | Discharge: 2013-06-11 | Disposition: A | Discharge status: Home / Self Care | Payer: Medicare Other | Source: Ambulatory Visit | Attending: Cardiology | Admitting: Cardiology

## 2013-06-11 DIAGNOSIS — Z7982 Long term (current) use of aspirin: Secondary | ICD-10-CM | POA: Insufficient documentation

## 2013-06-11 DIAGNOSIS — Z87891 Personal history of nicotine dependence: Secondary | ICD-10-CM | POA: Diagnosis not present

## 2013-06-11 DIAGNOSIS — E119 Type 2 diabetes mellitus without complications: Secondary | ICD-10-CM | POA: Diagnosis not present

## 2013-06-11 DIAGNOSIS — Z6834 Body mass index (BMI) 34.0-34.9, adult: Secondary | ICD-10-CM | POA: Insufficient documentation

## 2013-06-11 DIAGNOSIS — R0609 Other forms of dyspnea: Secondary | ICD-10-CM | POA: Diagnosis not present

## 2013-06-11 DIAGNOSIS — I2510 Atherosclerotic heart disease of native coronary artery without angina pectoris: Secondary | ICD-10-CM | POA: Insufficient documentation

## 2013-06-11 DIAGNOSIS — R079 Chest pain, unspecified: Secondary | ICD-10-CM

## 2013-06-11 DIAGNOSIS — E669 Obesity, unspecified: Secondary | ICD-10-CM | POA: Diagnosis not present

## 2013-06-11 DIAGNOSIS — R0989 Other specified symptoms and signs involving the circulatory and respiratory systems: Secondary | ICD-10-CM | POA: Insufficient documentation

## 2013-06-11 DIAGNOSIS — I10 Essential (primary) hypertension: Secondary | ICD-10-CM | POA: Insufficient documentation

## 2013-06-11 DIAGNOSIS — E785 Hyperlipidemia, unspecified: Secondary | ICD-10-CM | POA: Diagnosis not present

## 2013-06-11 DIAGNOSIS — R9439 Abnormal result of other cardiovascular function study: Secondary | ICD-10-CM | POA: Diagnosis not present

## 2013-06-11 DIAGNOSIS — Z794 Long term (current) use of insulin: Secondary | ICD-10-CM | POA: Insufficient documentation

## 2013-06-11 HISTORY — PX: LEFT HEART CATHETERIZATION WITH CORONARY ANGIOGRAM: SHX5451

## 2013-06-11 LAB — CBC WITH DIFFERENTIAL/PLATELET
Basophils Absolute: 0 10*3/uL (ref 0.0–0.1)
Basophils Relative: 0 % (ref 0–1)
HCT: 39 % (ref 39.0–52.0)
Hemoglobin: 14.2 g/dL (ref 13.0–17.0)
Lymphocytes Relative: 37 % (ref 12–46)
Lymphs Abs: 2.4 10*3/uL (ref 0.7–4.0)
MCHC: 36.4 g/dL — ABNORMAL HIGH (ref 30.0–36.0)
Monocytes Absolute: 0.7 10*3/uL (ref 0.1–1.0)
Monocytes Relative: 11 % (ref 3–12)
Neutro Abs: 3.3 10*3/uL (ref 1.7–7.7)
Platelets: 216 10*3/uL (ref 150–400)
RBC: 4.06 MIL/uL — ABNORMAL LOW (ref 4.22–5.81)
WBC: 6.6 10*3/uL (ref 4.0–10.5)

## 2013-06-11 LAB — BASIC METABOLIC PANEL
BUN: 10 mg/dL (ref 6–23)
CO2: 24 mEq/L (ref 19–32)
Chloride: 101 mEq/L (ref 96–112)
Creatinine, Ser: 0.83 mg/dL (ref 0.50–1.35)
GFR calc Af Amer: >90 mL/min (ref >90)
Glucose, Bld: 164 mg/dL — ABNORMAL HIGH (ref 70–99)
Potassium: 3.9 mEq/L (ref 3.5–5.1)

## 2013-06-11 LAB — GLUCOSE, CAPILLARY: Glucose-Capillary: 122 mg/dL — ABNORMAL HIGH (ref 70–99)

## 2013-06-11 LAB — APTT: aPTT: 37 seconds (ref 24–37)

## 2013-06-11 SURGERY — LEFT HEART CATHETERIZATION WITH CORONARY ANGIOGRAM
Anesthesia: LOCAL

## 2013-06-11 MED ORDER — DIAZEPAM 5 MG PO TABS
10.0000 mg | ORAL_TABLET | ORAL | Status: AC
  Administered 2013-06-11: 10 mg via ORAL
  Filled 2013-06-11: qty 2

## 2013-06-11 MED ORDER — SODIUM CHLORIDE 0.9 % IJ SOLN: 3.0000 mL | Freq: Two times a day (BID) | INTRAMUSCULAR | Status: DC

## 2013-06-11 MED ORDER — ASPIRIN 81 MG PO CHEW: 81.0000 mg | CHEWABLE_TABLET | ORAL | Status: DC

## 2013-06-11 MED ORDER — ACETAMINOPHEN 325 MG PO TABS
650.0000 mg | ORAL_TABLET | ORAL | Status: DC | PRN
  Administered 2013-06-11: 650 mg via ORAL
  Filled 2013-06-11: qty 2

## 2013-06-11 MED ORDER — SODIUM CHLORIDE 0.9 % IJ SOLN: 3.0000 mL | INTRAMUSCULAR | Status: DC | PRN

## 2013-06-11 MED ORDER — HEPARIN SODIUM (PORCINE) 1000 UNIT/ML IJ SOLN
INTRAMUSCULAR | Status: AC
  Filled 2013-06-11: qty 1

## 2013-06-11 MED ORDER — HYDROCODONE-ACETAMINOPHEN 5-325 MG PO TABS
2.0000 | ORAL_TABLET | Freq: Once | ORAL | Status: AC
  Administered 2013-06-11: 1 via ORAL
  Filled 2013-06-11: qty 2
  Filled 2013-06-11: qty 1

## 2013-06-11 MED ORDER — VERAPAMIL HCL 2.5 MG/ML IV SOLN
INTRAVENOUS | Status: AC
  Filled 2013-06-11: qty 2

## 2013-06-11 MED ORDER — SODIUM CHLORIDE 0.9 % IV SOLN: INTRAVENOUS | Status: DC

## 2013-06-11 MED ORDER — FENTANYL CITRATE 0.05 MG/ML IJ SOLN
INTRAMUSCULAR | Status: AC
  Filled 2013-06-11: qty 2

## 2013-06-11 MED ORDER — SODIUM CHLORIDE 0.9 % IV SOLN
INTRAVENOUS | Status: DC
  Administered 2013-06-11: 11:00:00 via INTRAVENOUS

## 2013-06-11 MED ORDER — MIDAZOLAM HCL 2 MG/2ML IJ SOLN
INTRAMUSCULAR | Status: AC
  Filled 2013-06-11: qty 2

## 2013-06-11 MED ORDER — SODIUM CHLORIDE 0.9 % IV SOLN: 250.0000 mL | INTRAVENOUS | Status: DC | PRN

## 2013-06-11 NOTE — Progress Notes (Signed)
Upon deflating TR band for first 3cc of air, bleeding noted. 3 cc air reinjected into band. Will monitor.

## 2013-06-11 NOTE — Progress Notes (Signed)
Upon deflation, pt rebleed. 3 cc air reinjected, bleeding stopped. Dr. Swaziland informed, instructed to wait an hour and attempt deflation again. Will monitor.

## 2013-06-11 NOTE — H&P (View-Only) (Signed)
 Jerry [name] Date of Birth: 04/17/1956 Medical Record #[id number]  History of Present Illness: [name] is seen back today for a pre cath visit. Seen for Dr. Jordan. Was seen here back in early October for an abnormal EKG - has HTN, DM and HLD. No true chest pain. Referred for Myoview - results below - now referred for cath.  Comes in today. Here with his wife. Will have some chest aching with exertion and some DOE. Has not picked up his aspirin or Coreg. Has a cough with the Lisinopril and has not had any since Monday. BP is up. Asking for pain medicine as well. Not able to tell me what his A1C is.   Current Outpatient Prescriptions  Medication Sig Dispense Refill  . aspirin EC 81 MG tablet Take 1 tablet (81 mg total) by mouth daily.  30 tablet  6  . HYDROcodone-acetaminophen (NORCO/VICODIN) 5-325 MG per tablet Take 2 tablets by mouth 2 (two) times daily as needed (knee pain).      . Insulin Glargine (LANTUS SOLOSTAR) 100 UNIT/ML SOPN Inject 10 Units into the skin at bedtime.      . lisinopril (PRINIVIL,ZESTRIL) 20 MG tablet Take 20 mg by mouth daily.      . metFORMIN (GLUCOPHAGE) 850 MG tablet Take 850 mg by mouth 2 (two) times daily with a meal.      . carvedilol (COREG) 6.25 MG tablet Take 1 tablet (6.25 mg total) by mouth 2 (two) times daily.  60 tablet  6   No current facility-administered medications for this visit.    No Known Allergies  Past Medical History  Diagnosis Date  . Dyslipidemia   . Arthritis   . Diabetes mellitus     07/2006 with HgbA1C of 7.5%  . Hypogonadism male   . HTN (hypertension)     Past Surgical History  Procedure Laterality Date  . Incision and drainage perirectal abscess    . Knee surgery      left knee - remote past  . Colonoscopy  08/2006    Dr. Stark - repeat 2018    History  Smoking status  . Former Smoker  . Types: Cigarettes  . Quit date: 11/11/2001  Smokeless tobacco  . Never Used    History  Alcohol Use No    Family  History  Problem Relation Age of Onset  . Diabetes Mother   . Other Father     death by MVA  . Other Brother     died of MVA  . Stroke Neg Hx   . Heart disease Neg Hx   . Cancer Neg Hx   . Hypertension Neg Hx     Review of Systems: The review of systems is per the HPI.  All other systems were reviewed and are negative.  Physical Exam: BP 160/100  Pulse 88  Ht 6' 4" (1.93 m)  Wt 283 lb 3.2 oz (128.459 kg)  BMI 34.49 kg/m2 Patient is very pleasant and in no acute distress. He is obese. Skin is warm and dry. Color is normal.  HEENT is unremarkable. Normocephalic/atraumatic. PERRL. Sclera are nonicteric. Neck is supple. No masses. No JVD. Lungs are clear. Cardiac exam shows a regular rate and rhythm. Abdomen is soft. Extremities are without edema. Gait and ROM are intact. No gross neurologic deficits noted.  LABORATORY DATA: PENDING  Lab Results  Component Value Date   WBC 7.4 11/30/2012   HGB 13.6 11/30/2012   HCT 40.0 11/30/2012     PLT 209 11/30/2012   GLUCOSE 140* 12/21/2012   CHOL 181 12/21/2012   TRIG 119 12/21/2012   HDL 39* 12/21/2012   LDLCALC 118* 12/21/2012   ALT 13 12/21/2012   AST 13 12/21/2012   NA 140 12/21/2012   K 4.3 12/21/2012   CL 105 12/21/2012   CREATININE 0.87 12/21/2012   BUN 9 12/21/2012   CO2 24 12/21/2012   TSH 1.304 11/15/2011   PSA 2.20 11/12/2011   HGBA1C 9.1* 11/12/2011   MICROALBUR 0.50 12/21/2012    Exercise Capacity: Lexiscan with low level exercise.  BP Response: There was a hypertensive response to the infusion  Clinical Symptoms: No significant symptoms noted.  ECG Impression: No significant ST segment change suggestive of ischemia.  Comparison with Prior Nuclear Study: No previous nuclear study performed  Overall Impression: The study is abnormal. There are mild wall motion abnormalities. There is mild scar at the mid/apical inferior segments. There is mild ischemia at the base inferior, apical cap, and apical anterior segments. This is a moderate risk  scan..  LV Ejection Fraction: 43%. LV Wall Motion: Mild global hypokinesis and mild hypokinesis of the septum.  Jerry [name]   Assessment / Plan: 1. Abnormal Myoview - proceed on with cardiac cath - the procedure, risks and benefits have been reviewed and he is willing to proceed on Friday. Checking labs and CXR today. Holding metformin after tonight's dose.  2. HTN - not on his medicines. Reports cough with ACE. I have stopped the Lisinopril. Adding Losartan 100 mg a day. He is to take with the Coreg. Samples of aspirin given here today.   Patient is agreeable to this plan and will call if any problems develop in the interim.   Amaru Burroughs C. Greenley Martone, RN, ANP-C [facility] [address] [place], [place]  [address]  

## 2013-06-11 NOTE — Interval H&P Note (Signed)
History and Physical Interval Note:  06/11/2013 12:56 PM  Jerry [name]  has presented today for surgery, with the diagnosis of ABN MYOVIEW  The various methods of treatment have been discussed with the patient and family. After consideration of risks, benefits and other options for treatment, the patient has consented to  Procedure(s): LEFT HEART CATHETERIZATION WITH CORONARY ANGIOGRAM (N/A) as a surgical intervention .  The patient's history has been reviewed, patient examined, no change in status, stable for surgery.  I have reviewed the patient's chart and labs.  Questions were answered to the patient's satisfaction.   Cath Lab Visit (complete for each Cath Lab visit)  Clinical Evaluation Leading to the Procedure:   ACS: no  Non-ACS:    Anginal Classification: CCS II  Anti-ischemic medical therapy: Maximal Therapy (2 or more classes of medications)  Non-Invasive Test Results: High-risk stress test findings: cardiac mortality >3%/year  Prior CABG: No previous CABG        Theron Arista [facility] 06/11/2013 12:56 PM

## 2013-06-11 NOTE — CV Procedure (Signed)
    Cardiac Catheterization Procedure Note  Name: Jerry [name] MRN: [id number] DOB: 05/31/1956  Procedure: Left Heart Cath, Selective Coronary Angiography, LV angiography  Indication: 57 yo BM with severe HTN presents with symptoms of dyspnea. Stress myoview is abnormal suggesting multivessel CAD.   Procedural Details: The right wrist was prepped, draped, and anesthetized with 1% lidocaine. Using the modified Seldinger technique, a 5 French sheath was introduced into the right radial artery. 3 mg of verapamil was administered through the sheath, weight-based unfractionated heparin was administered intravenously. Standard Judkins catheters were used for selective coronary angiography and left ventriculography. Catheter exchanges were performed over an exchange length guidewire. There were no immediate procedural complications. A TR band was used for radial hemostasis at the completion of the procedure.  The patient was transferred to the post catheterization recovery area for further monitoring.  Procedural Findings: Hemodynamics: AO 127/75 mean 99 mmHg LV 132/9 mm Hg  Coronary angiography: Coronary dominance: left  Left mainstem: Normal  Left anterior descending (LAD): Mild disease in the distal vessel to 20%.  Ramus intermediate: Normal  Left circumflex (LCx): large dominant vessel. Diffuses disease up to 20%.  Right coronary artery (RCA): Small nondominant, 30% disease.  Left ventriculography: Left ventricular systolic function is normal, LVEF is estimated at 55-65%, there is no significant mitral regurgitation   Final Conclusions:   1. Nonobstructive CAD 2. Normal LV function  Recommendations: Risk factor modification.  Theron Arista [facility] 06/11/2013, 1:27 PM

## 2013-06-17 ENCOUNTER — Ambulatory Visit (INDEPENDENT_AMBULATORY_CARE_PROVIDER_SITE_OTHER): Payer: Medicare Other | Admitting: Cardiology

## 2013-06-17 ENCOUNTER — Encounter: Payer: Self-pay | Admitting: Cardiology

## 2013-06-17 VITALS — BP 160/102 | HR 88 | Ht 76.0 in | Wt 284.0 lb

## 2013-06-17 DIAGNOSIS — E785 Hyperlipidemia, unspecified: Secondary | ICD-10-CM

## 2013-06-17 DIAGNOSIS — I10 Essential (primary) hypertension: Secondary | ICD-10-CM

## 2013-06-17 MED ORDER — CARVEDILOL 12.5 MG PO TABS: 12.5000 mg | ORAL_TABLET | Freq: Two times a day (BID) | ORAL | Status: DC

## 2013-06-17 NOTE — Progress Notes (Signed)
Jerry [name] Date of Birth: 1956-04-14 Medical Record #[id number]  History of Present Illness: Jerry [name] is seen for follow up post cardiac cath. Was seen here back in early October for an abnormal EKG - has HTN, DM and HLD. Myoview study was abnormal suggesting multivessel disease and moderate LV dysfunction. Cardiac cath showed nonobstructive CAD and normal LV function. No complications. States he is trying to follow a healthier diet but misses his bologna sandwichs.    Current Outpatient Prescriptions  Medication Sig Dispense Refill  . aspirin EC 81 MG tablet Take 1 tablet (81 mg total) by mouth daily.  30 tablet  6  . HYDROcodone-acetaminophen (NORCO/VICODIN) 5-325 MG per tablet Take 2 tablets by mouth 2 (two) times daily as needed (knee pain).      . Insulin Glargine (LANTUS SOLOSTAR) 100 UNIT/ML SOPN Inject 10 Units into the skin at bedtime.      Marland Kitchen losartan (COZAAR) 100 MG tablet Take 1 tablet (100 mg total) by mouth daily.  30 tablet  6  . metFORMIN (GLUCOPHAGE) 850 MG tablet 1 tab twice a day      . carvedilol (COREG) 12.5 MG tablet Take 1 tablet (12.5 mg total) by mouth 2 (two) times daily.  180 tablet  3   No current facility-administered medications for this visit.    Allergies  Allergen Reactions  . Lisinopril Cough    Past Medical History  Diagnosis Date  . Dyslipidemia   . Arthritis   . Diabetes mellitus     07/2006 with HgbA1C of 7.5%  . Hypogonadism male   . HTN (hypertension)     Past Surgical History  Procedure Laterality Date  . Incision and drainage perirectal abscess    . Knee surgery      left knee - remote past  . Colonoscopy  08/2006    Dr. Russella Dar - repeat 2018    History  Smoking status  . Former Smoker  . Types: Cigarettes  . Quit date: 11/11/2001  Smokeless tobacco  . Never Used    History  Alcohol Use No    Family History  Problem Relation Age of Onset  . Diabetes Mother   . Other Father     death by MVA  . Other Brother      died of MVA  . Stroke Neg Hx   . Heart disease Neg Hx   . Cancer Neg Hx   . Hypertension Neg Hx     Review of Systems: The review of systems is per the HPI.  All other systems were reviewed and are negative.  Physical Exam: BP 160/102  Pulse 88  Ht 6\' 4"  (1.93 m)  Wt 284 lb (128.822 kg)  BMI 34.58 kg/m2 Patient is very pleasant and in no acute distress. He is obese. Skin is warm and dry. Color is normal.  HEENT is unremarkable. Normocephalic/atraumatic. PERRL. Sclera are nonicteric. Neck is supple. No masses. No JVD. Lungs are clear. Cardiac exam shows a regular rate and rhythm. Abdomen is soft. Extremities are without edema. No radial site hematoma. Pulse 2+. Gait and ROM are intact. No gross neurologic deficits noted.  LABORATORY DATA: PENDING  Lab Results  Component Value Date   WBC 6.6 06/11/2013   HGB 14.2 06/11/2013   HCT 39.0 06/11/2013   PLT 216 06/11/2013   GLUCOSE 164* 06/11/2013   CHOL 181 12/21/2012   TRIG 119 12/21/2012   HDL 39* 12/21/2012   LDLCALC 118* 12/21/2012   ALT 13  12/21/2012   AST 13 12/21/2012   NA 136 06/11/2013   K 3.9 06/11/2013   CL 101 06/11/2013   CREATININE 0.83 06/11/2013   BUN 10 06/11/2013   CO2 24 06/11/2013   TSH 1.304 11/15/2011   PSA 2.20 11/12/2011   INR 1.06 06/11/2013   HGBA1C 9.1* 11/12/2011   MICROALBUR 0.50 12/21/2012    Cardiac Catheterization Procedure Note  Name: Jerry [name]  MRN: [id number]  DOB: [birth year]  Procedure: Left Heart Cath, Selective Coronary Angiography, LV angiography  Indication: 57 yo BM with severe HTN presents with symptoms of dyspnea. Stress myoview is abnormal suggesting multivessel CAD.  Procedural Details: The right wrist was prepped, draped, and anesthetized with 1% lidocaine. Using the modified Seldinger technique, a 5 French sheath was introduced into the right radial artery. 3 mg of verapamil was administered through the sheath, weight-based unfractionated heparin was administered intravenously. Standard  Judkins catheters were used for selective coronary angiography and left ventriculography. Catheter exchanges were performed over an exchange length guidewire. There were no immediate procedural complications. A TR band was used for radial hemostasis at the completion of the procedure. The patient was transferred to the post catheterization recovery area for further monitoring.  Procedural Findings:  Hemodynamics:  AO 127/75 mean 99 mmHg  LV 132/9 mm Hg  Coronary angiography:  Coronary dominance: left  Left mainstem: Normal  Left anterior descending (LAD): Mild disease in the distal vessel to 20%.  Ramus intermediate: Normal  Left circumflex (LCx): large dominant vessel. Diffuses disease up to 20%.  Right coronary artery (RCA): Small nondominant, 30% disease.  Left ventriculography: Left ventricular systolic function is normal, LVEF is estimated at 55-65%, there is no significant mitral regurgitation  Final Conclusions:  1. Nonobstructive CAD  2. Normal LV function  Recommendations: Risk factor modification.  Jerry [name]  06/11/2013, 1:27 PM    Assessment / Plan: 1. Falsely abnormal Myoview - cardiac cath with mild nonobstructive disease. Need to focus on risk factor modification.   2. HTN - still poorly controlled. Recommend increasing Coreg to 12.5 mg bid. Continue losartan. Stressed the importance of medication and lifestyle modification. Needs close follow up with PCP. I anticipate he will need more medication.  3. Arthritis. He is requesting pain medication. I told him this would have to come from his PCP.

## 2013-06-17 NOTE — Patient Instructions (Signed)
Increase carvedilol to 12.5 mg twice a day. Continue losartan.  I will follow up in 3 months.

## 2013-06-28 ENCOUNTER — Telehealth: Payer: Self-pay | Admitting: Medical

## 2013-06-28 ENCOUNTER — Other Ambulatory Visit: Payer: Self-pay | Admitting: Family Medicine

## 2013-06-28 MED ORDER — TADALAFIL 20 MG PO TABS: 20.0000 mg | ORAL_TABLET | Freq: Every day | ORAL | Status: DC | PRN

## 2013-06-28 NOTE — Telephone Encounter (Signed)
Patient came by the house to pick up his samples. CLS

## 2013-06-28 NOTE — Telephone Encounter (Signed)
done

## 2013-07-16 ENCOUNTER — Telehealth: Payer: Self-pay | Admitting: Medical

## 2013-07-16 NOTE — Telephone Encounter (Signed)
Pt wants cialis samples

## 2013-07-16 NOTE — Telephone Encounter (Signed)
Lets have f/u OV.  Has he discussed medications with cardiology regarding ED?

## 2013-07-19 NOTE — Telephone Encounter (Signed)
L/m for pt to call needs an appt per Vincenza Hews tysinger

## 2013-08-05 DIAGNOSIS — M542 Cervicalgia: Secondary | ICD-10-CM | POA: Diagnosis not present

## 2013-08-05 DIAGNOSIS — M5412 Radiculopathy, cervical region: Secondary | ICD-10-CM | POA: Diagnosis not present

## 2013-08-10 DIAGNOSIS — M5412 Radiculopathy, cervical region: Secondary | ICD-10-CM | POA: Diagnosis not present

## 2013-08-10 DIAGNOSIS — M542 Cervicalgia: Secondary | ICD-10-CM | POA: Diagnosis not present

## 2013-09-09 ENCOUNTER — Ambulatory Visit (INDEPENDENT_AMBULATORY_CARE_PROVIDER_SITE_OTHER): Payer: Medicare Other | Admitting: Cardiology

## 2013-09-09 ENCOUNTER — Encounter: Payer: Self-pay | Admitting: Cardiology

## 2013-09-09 VITALS — BP 168/107 | HR 98 | Ht 76.0 in | Wt 280.0 lb

## 2013-09-09 DIAGNOSIS — I10 Essential (primary) hypertension: Secondary | ICD-10-CM

## 2013-09-09 DIAGNOSIS — E785 Hyperlipidemia, unspecified: Secondary | ICD-10-CM

## 2013-09-09 MED ORDER — CARVEDILOL 25 MG PO TABS: 25.0000 mg | ORAL_TABLET | Freq: Two times a day (BID) | ORAL | Status: DC

## 2013-09-09 MED ORDER — AMLODIPINE BESYLATE 5 MG PO TABS: 5.0000 mg | ORAL_TABLET | Freq: Every day | ORAL | Status: DC

## 2013-09-09 NOTE — Patient Instructions (Signed)
Avoid salt  We will increase carvediolol 25 mg twice a day  We will add amlodipine 5 mg daily for your blood pressure.

## 2013-09-09 NOTE — Progress Notes (Signed)
Jerry [name] Date of Birth: 31-Jul-1955 Medical Record #[id number]  History of Present Illness: Jerry [name] is seen for follow up post cardiac cath. Was seen here back in early October for an abnormal EKG - has HTN, DM and HLD. Myoview study was abnormal suggesting multivessel disease and moderate LV dysfunction. Cardiac cath showed nonobstructive CAD and normal LV function. No complications. Reports he is trying to eat healthy and has eliminated a lot of salt from his diet. He complains of some numbness and tingling in the posterior aspect of his left shoulder.    Current Outpatient Prescriptions  Medication Sig Dispense Refill  . aspirin EC 81 MG tablet Take 1 tablet (81 mg total) by mouth daily.  30 tablet  6  . HYDROcodone-acetaminophen (NORCO/VICODIN) 5-325 MG per tablet Take 2 tablets by mouth 2 (two) times daily as needed (knee pain).      . Insulin Glargine (LANTUS SOLOSTAR) 100 UNIT/ML SOPN Inject 10 Units into the skin at bedtime.      Marland Kitchen losartan (COZAAR) 100 MG tablet Take 1 tablet (100 mg total) by mouth daily.  30 tablet  6  . metFORMIN (GLUCOPHAGE) 850 MG tablet 1 tab twice a day      . tadalafil (CIALIS) 20 MG tablet Take 1 tablet (20 mg total) by mouth daily as needed for erectile dysfunction.  3 tablet  0  . amLODipine (NORVASC) 5 MG tablet Take 1 tablet (5 mg total) by mouth daily.  180 tablet  3  . carvedilol (COREG) 25 MG tablet Take 1 tablet (25 mg total) by mouth 2 (two) times daily.  180 tablet  3   No current facility-administered medications for this visit.    Allergies  Allergen Reactions  . Lisinopril Cough    Past Medical History  Diagnosis Date  . Dyslipidemia   . Arthritis   . Diabetes mellitus     07/2006 with HgbA1C of 7.5%  . Hypogonadism male   . HTN (hypertension)     Past Surgical History  Procedure Laterality Date  . Incision and drainage perirectal abscess    . Knee surgery      left knee - remote past  . Colonoscopy  08/2006    Dr.  Russella Dar - repeat 2018    History  Smoking status  . Former Smoker  . Types: Cigarettes  . Quit date: 11/11/2001  Smokeless tobacco  . Never Used    History  Alcohol Use No    Family History  Problem Relation Age of Onset  . Diabetes Mother   . Other Father     death by MVA  . Other Brother     died of MVA  . Stroke Neg Hx   . Heart disease Neg Hx   . Cancer Neg Hx   . Hypertension Neg Hx     Review of Systems: The review of systems is per the HPI.  All other systems were reviewed and are negative.  Physical Exam: BP 168/107  Pulse 98  Ht 6\' 4"  (1.93 m)  Wt 280 lb (127.007 kg)  BMI 34.10 kg/m2 Patient is very pleasant and in no acute distress. He is obese. Skin is warm and dry. Color is normal.  HEENT is unremarkable. Normocephalic/atraumatic. PERRL. Sclera are nonicteric. Neck is supple. No masses. No JVD. Lungs are clear. Cardiac exam shows a regular rate and rhythm. Abdomen is soft. Extremities are without edema.  Pulse 2+. Gait and ROM are intact. No gross neurologic  deficits noted.  LABORATORY DATA:   Lab Results  Component Value Date   WBC 6.6 06/11/2013   HGB 14.2 06/11/2013   HCT 39.0 06/11/2013   PLT 216 06/11/2013   GLUCOSE 164* 06/11/2013   CHOL 181 12/21/2012   TRIG 119 12/21/2012   HDL 39* 12/21/2012   LDLCALC 118* 12/21/2012   ALT 13 12/21/2012   AST 13 12/21/2012   NA 136 06/11/2013   K 3.9 06/11/2013   CL 101 06/11/2013   CREATININE 0.83 06/11/2013   BUN 10 06/11/2013   CO2 24 06/11/2013   TSH 1.304 11/15/2011   PSA 2.20 11/12/2011   INR 1.06 06/11/2013   HGBA1C 9.1* 11/12/2011   MICROALBUR 0.50 12/21/2012    Cardiac Catheterization Procedure Note  Name: VELMA HANNA  MRN: [id number]  DOB: 1955/08/18  Procedure: Left Heart Cath, Selective Coronary Angiography, LV angiography  Indication: 58 yo BM with severe HTN presents with symptoms of dyspnea. Stress myoview is abnormal suggesting multivessel CAD.  Procedural Details: The right wrist was prepped,  draped, and anesthetized with 1% lidocaine. Using the modified Seldinger technique, a 5 French sheath was introduced into the right radial artery. 3 mg of verapamil was administered through the sheath, weight-based unfractionated heparin was administered intravenously. Standard Judkins catheters were used for selective coronary angiography and left ventriculography. Catheter exchanges were performed over an exchange length guidewire. There were no immediate procedural complications. A TR band was used for radial hemostasis at the completion of the procedure. The patient was transferred to the post catheterization recovery area for further monitoring.  Procedural Findings:  Hemodynamics:  AO 127/75 mean 99 mmHg  LV 132/9 mm Hg  Coronary angiography:  Coronary dominance: left  Left mainstem: Normal  Left anterior descending (LAD): Mild disease in the distal vessel to 20%.  Ramus intermediate: Normal  Left circumflex (LCx): large dominant vessel. Diffuses disease up to 20%.  Right coronary artery (RCA): Small nondominant, 30% disease.  Left ventriculography: Left ventricular systolic function is normal, LVEF is estimated at 55-65%, there is no significant mitral regurgitation  Final Conclusions:  1. Nonobstructive CAD  2. Normal LV function  Recommendations: Risk factor modification.  Theron Arista [facility]  06/11/2013, 1:27 PM    Assessment / Plan: 1. Falsely abnormal Myoview - cardiac cath with mild nonobstructive disease. Need to focus on risk factor modification.   2. HTN - still poorly controlled. Recommend increasing Coreg to 25mg  bid. Continue losartan. Will add amlodipine 5 mg daily. Stressed the importance of medication and lifestyle modification. Needs close follow up with PCP and I will see back as needed.  3. Arthritis.

## 2013-09-13 DIAGNOSIS — M5412 Radiculopathy, cervical region: Secondary | ICD-10-CM | POA: Diagnosis not present

## 2013-09-13 DIAGNOSIS — M542 Cervicalgia: Secondary | ICD-10-CM | POA: Diagnosis not present

## 2013-09-15 DIAGNOSIS — M542 Cervicalgia: Secondary | ICD-10-CM | POA: Diagnosis not present

## 2013-09-15 DIAGNOSIS — M5412 Radiculopathy, cervical region: Secondary | ICD-10-CM | POA: Diagnosis not present

## 2013-09-17 DIAGNOSIS — M542 Cervicalgia: Secondary | ICD-10-CM | POA: Diagnosis not present

## 2013-09-17 DIAGNOSIS — M5412 Radiculopathy, cervical region: Secondary | ICD-10-CM | POA: Diagnosis not present

## 2013-09-20 ENCOUNTER — Encounter: Payer: Self-pay | Admitting: Medical

## 2013-09-20 ENCOUNTER — Ambulatory Visit (INDEPENDENT_AMBULATORY_CARE_PROVIDER_SITE_OTHER): Payer: Medicare Other | Admitting: Medical

## 2013-09-20 ENCOUNTER — Telehealth: Payer: Self-pay | Admitting: Family Medicine

## 2013-09-20 VITALS — BP 130/80 | HR 68 | Temp 98.0°F | Resp 16 | Wt 284.0 lb

## 2013-09-20 DIAGNOSIS — M5412 Radiculopathy, cervical region: Secondary | ICD-10-CM | POA: Diagnosis not present

## 2013-09-20 DIAGNOSIS — E1165 Type 2 diabetes mellitus with hyperglycemia: Secondary | ICD-10-CM

## 2013-09-20 DIAGNOSIS — IMO0002 Reserved for concepts with insufficient information to code with codable children: Secondary | ICD-10-CM | POA: Diagnosis not present

## 2013-09-20 DIAGNOSIS — M179 Osteoarthritis of knee, unspecified: Secondary | ICD-10-CM

## 2013-09-20 DIAGNOSIS — IMO0001 Reserved for inherently not codable concepts without codable children: Secondary | ICD-10-CM | POA: Insufficient documentation

## 2013-09-20 DIAGNOSIS — N529 Male erectile dysfunction, unspecified: Secondary | ICD-10-CM | POA: Insufficient documentation

## 2013-09-20 DIAGNOSIS — E119 Type 2 diabetes mellitus without complications: Secondary | ICD-10-CM | POA: Insufficient documentation

## 2013-09-20 DIAGNOSIS — M5030 Other cervical disc degeneration, unspecified cervical region: Secondary | ICD-10-CM

## 2013-09-20 DIAGNOSIS — M542 Cervicalgia: Secondary | ICD-10-CM | POA: Diagnosis not present

## 2013-09-20 DIAGNOSIS — M1710 Unilateral primary osteoarthritis, unspecified knee: Secondary | ICD-10-CM | POA: Diagnosis not present

## 2013-09-20 MED ORDER — GABAPENTIN 100 MG PO CAPS: ORAL_CAPSULE | ORAL | Status: DC

## 2013-09-20 MED ORDER — TADALAFIL 20 MG PO TABS: 20.0000 mg | ORAL_TABLET | Freq: Every day | ORAL | Status: DC | PRN

## 2013-09-20 MED ORDER — HYDROCODONE-ACETAMINOPHEN 5-325 MG PO TABS: 2.0000 | ORAL_TABLET | Freq: Two times a day (BID) | ORAL | Status: DC | PRN

## 2013-09-20 NOTE — Telephone Encounter (Signed)
Message copied by Janeice Robinson on Mon Sep 20, 2013 12:39 PM ------      Message from: Jac Canavan      Created: Mon Sep 20, 2013  8:52 AM       Refer to physical therapy for degenerative disc disease of cervical spine and radicular symptoms ------

## 2013-09-20 NOTE — Telephone Encounter (Signed)
I fax a referral over to [facility] they will call him to schedule his appointment. CLS

## 2013-09-20 NOTE — Progress Notes (Signed)
   Subjective:   YOEL KAUFHOLD is a 58 y.o. male presenting on 09/20/2013 with pinched nerve and request samples of Cialis  Here for c/o 2 mo hx/o numbness and tingling in left upper back.  Symptoms are intermittent but fairly frequent. Denies left arm symptoms, denies neck pain.  Denies sharp or shooting pains down left arm.  Denies specific injury or trauma.   Has seen chiropractor 5 times in the last 2 months.   Has ongoing arthritis of the knees. Would like refill on pain medication for his knees.   Last time he used the vicodin, it worked pretty well. When it rains his knee pains are worse.  Like a refill on Cialis, it works well and he hasn't had medication in quite a while  Since last visit here he has had stress test of the heart. Everything was fine.  No other complaint.  Review of Systems ROS as in subjective      Objective:     Filed Vitals:   09/20/13 0821  BP: 130/80  Pulse: 68  Temp: 98 F (36.7 C)  Resp: 16    General appearance: alert, no distress, WD/WN Neck: supple, no lymphadenopathy, no thyromegaly, no masses, neck range of motion in all directions seems about 90-95% of normal, no obvious pain with range of motion Pulses: 2+ symmetric, upper extremities, normal cap refill MSK: Upper extremity normal range of motion, no obvious deformity of left shoulder, unremarkable upper extremity exam Neuro: Arms strength, sensation, DTRs within normal limits Back: Nontender   I reviewed the x-ray films and x-ray report from his chiropractor, Dr. Sela Hua at [address], [place], [place] [address].   I appreciate him providing [place] these copies.  X-ray of the cervical spine shows decreased cervical spine lordosis, moderate to severe degenerative disc disease C3-C6       Assessment: Encounter Diagnoses  Name Primary?  . Cervical radiculitis Yes  . DDD (degenerative disc disease), cervical   . Osteoarthritis, knee   . Erectile dysfunction   . Type II  or unspecified type diabetes mellitus without mention of complication, uncontrolled      Plan: DDD of cervical spine, cervical radiculitis-begin gabapentin 2 capsules ( 200 mg) at bedtime, after one week add on a daily morning 100 mg capsule.  Referral to physical therapy.  OA of the knee-short-term hydrocodone refill  Erectile dysfunction- reviewed his recent cardiac notes and studies.  Refilled Cialis which he is used prior without problem  Diabetes-advised that he long overdue for followup, he will make appointment  Tenoch was seen today for pinched nerve and request samples of cialis.  Diagnoses and associated orders for this visit:  Cervical radiculitis  DDD (degenerative disc disease), cervical  Osteoarthritis, knee  Erectile dysfunction  Type II or unspecified type diabetes mellitus without mention of complication, uncontrolled  Other Orders - gabapentin (NEURONTIN) 100 MG capsule; Begin 2 capsules at bedtime for a week, then add on 1 capsule in the morning daily - HYDROcodone-acetaminophen (NORCO/VICODIN) 5-325 MG per tablet; Take 2 tablets by mouth 2 (two) times daily as needed (knee pain). - tadalafil (CIALIS) 20 MG tablet; Take 1 tablet (20 mg total) by mouth daily as needed for erectile dysfunction.    Return soon for diabetes f/u.

## 2013-09-20 NOTE — Patient Instructions (Signed)
  Thank you for giving me the opportunity to serve you today.    Your diagnosis today includes: Encounter Diagnoses  Name Primary?  . Cervical radiculitis Yes  . DDD (degenerative disc disease), cervical   . Osteoarthritis, knee   . Erectile dysfunction   . Type II or unspecified type diabetes mellitus without mention of complication, uncontrolled      Specific recommendations today include:  Begin gabapentin 2 capsules at bedtime for one week, then add on a morning dose of 100 mg 1 capsule daily in the morning.  Thereafter 1 week you'll be taking 1 capsule in the morning 2 capsules at nighttime  We will refer you to physical therapy  I refilled the hydrocodone and Cialis today   Follow up: soon for routine diabetes followup

## 2013-09-29 ENCOUNTER — Other Ambulatory Visit: Payer: Self-pay | Admitting: Family Medicine

## 2013-09-29 MED ORDER — TADALAFIL 20 MG PO TABS: 20.0000 mg | ORAL_TABLET | Freq: Every day | ORAL | Status: DC | PRN

## 2013-10-01 ENCOUNTER — Encounter: Payer: Self-pay | Admitting: Medical

## 2013-10-04 ENCOUNTER — Telehealth: Payer: Self-pay | Admitting: Family Medicine

## 2013-10-04 NOTE — Telephone Encounter (Signed)
1) we need to pursue therapy before we take any other steps such as MRI or surgical procedure.  The whole reason of getting MRI is if we think there may be a surgical finding.   2) the hydrocodone is only short-term, I will not continue to refill this.  I have to options, we can either try and arthritis medications such as MOBIC once daily or we can enroll him in a study through [employer] that is using medication to treat knee arthritis 3) if samples of cialis, then you can give him some, otherwise, would he like a prescription sent for something else that may be cheaper

## 2013-10-04 NOTE — Telephone Encounter (Signed)
Patient called and said that the medication was not helping his shoulder. He said that he would rather go ahead and have the MRI instead of going to PT. What do you think?  CLS

## 2013-10-04 NOTE — Telephone Encounter (Signed)
I went over your message in detail but the patient is requesting a MRI first. I explain to him your recommendations but he feels like it will be a waste of time for PT. He agreed to the Mobic. I also explained to him that he will need to get a Rx for Cialis. Or for something cheaper. CLS Let me know what to do about the MRI?

## 2013-10-04 NOTE — Telephone Encounter (Signed)
Refer to MRI C spine then.

## 2013-10-04 NOTE — Telephone Encounter (Signed)
Patient called requesting a refill on pain medication. He is also requesting Cialis samples again. CLS

## 2013-10-26 ENCOUNTER — Telehealth: Payer: Self-pay | Admitting: Family Medicine

## 2013-10-26 ENCOUNTER — Other Ambulatory Visit: Payer: Self-pay | Admitting: Family Medicine

## 2013-10-26 MED ORDER — TADALAFIL 20 MG PO TABS: 20.0000 mg | ORAL_TABLET | Freq: Every day | ORAL | Status: DC | PRN

## 2013-10-26 NOTE — Telephone Encounter (Signed)
Patient came by the office requesting a Rx refills for Vicodin sent to his pharmacy. CLS

## 2013-10-29 NOTE — Telephone Encounter (Signed)
Back in May 2014 when we last did xrays, I had suggested ortho referral.   Did he ever see ortho in last 12 months?  I don't think so.  So if not, refer to ortho for better control of knee pain.  One other option, there is a [employer] study current for knee arthritis.  So if he would be agreeable to a study, he gets free medication, free monitoring of this condition, and compensation.  Let me know.

## 2013-10-29 NOTE — Telephone Encounter (Signed)
Patient states that he did see Ortho. He saw Dr. Berton Lan last year. CLS Kristian Covey [facility] is aware of this. CLS  Patient is aware of Kristian Covey [facility] ,message and I explain to him that he would have to return back to Ortho. And That Dr. Berton Lan would have to refill his pain medication. CLS

## 2013-12-25 ENCOUNTER — Other Ambulatory Visit: Payer: Self-pay | Admitting: Medical

## 2014-01-03 ENCOUNTER — Other Ambulatory Visit: Payer: Self-pay | Admitting: Family Medicine

## 2014-01-03 ENCOUNTER — Telehealth: Payer: Self-pay

## 2014-01-03 MED ORDER — METFORMIN HCL 850 MG PO TABS: 850.0000 mg | ORAL_TABLET | Freq: Two times a day (BID) | ORAL | Status: DC

## 2014-01-03 NOTE — Telephone Encounter (Signed)
I sent the refill in for metformin for only 30 days until he comes in for his OV. CLS

## 2014-01-03 NOTE — Telephone Encounter (Signed)
PT WOULD LIKE TO HAVE METFORMIN REFILLED

## 2014-01-14 ENCOUNTER — Encounter: Payer: Self-pay | Admitting: Medical

## 2014-01-14 ENCOUNTER — Ambulatory Visit (INDEPENDENT_AMBULATORY_CARE_PROVIDER_SITE_OTHER): Payer: Medicare Other | Admitting: Medical

## 2014-01-14 VITALS — BP 130/80 | HR 92 | Temp 97.8°F | Resp 14 | Ht 76.0 in | Wt 273.0 lb

## 2014-01-14 DIAGNOSIS — E1165 Type 2 diabetes mellitus with hyperglycemia: Secondary | ICD-10-CM

## 2014-01-14 DIAGNOSIS — N529 Male erectile dysfunction, unspecified: Secondary | ICD-10-CM | POA: Diagnosis not present

## 2014-01-14 DIAGNOSIS — E669 Obesity, unspecified: Secondary | ICD-10-CM

## 2014-01-14 DIAGNOSIS — Z0000 Encounter for general adult medical examination without abnormal findings: Secondary | ICD-10-CM

## 2014-01-14 DIAGNOSIS — Z91199 Patient's noncompliance with other medical treatment and regimen due to unspecified reason: Secondary | ICD-10-CM | POA: Diagnosis not present

## 2014-01-14 DIAGNOSIS — E785 Hyperlipidemia, unspecified: Secondary | ICD-10-CM | POA: Diagnosis not present

## 2014-01-14 DIAGNOSIS — M1710 Unilateral primary osteoarthritis, unspecified knee: Secondary | ICD-10-CM

## 2014-01-14 DIAGNOSIS — I10 Essential (primary) hypertension: Secondary | ICD-10-CM | POA: Insufficient documentation

## 2014-01-14 DIAGNOSIS — Z23 Encounter for immunization: Secondary | ICD-10-CM | POA: Diagnosis not present

## 2014-01-14 DIAGNOSIS — E1149 Type 2 diabetes mellitus with other diabetic neurological complication: Secondary | ICD-10-CM | POA: Insufficient documentation

## 2014-01-14 DIAGNOSIS — IMO0002 Reserved for concepts with insufficient information to code with codable children: Secondary | ICD-10-CM

## 2014-01-14 DIAGNOSIS — M170 Bilateral primary osteoarthritis of knee: Secondary | ICD-10-CM

## 2014-01-14 DIAGNOSIS — IMO0001 Reserved for inherently not codable concepts without codable children: Secondary | ICD-10-CM | POA: Diagnosis not present

## 2014-01-14 DIAGNOSIS — Z9119 Patient's noncompliance with other medical treatment and regimen: Secondary | ICD-10-CM

## 2014-01-14 DIAGNOSIS — B356 Tinea cruris: Secondary | ICD-10-CM

## 2014-01-14 LAB — HEPATIC FUNCTION PANEL
ALBUMIN: 4.4 g/dL (ref 3.5–5.2)
ALK PHOS: 54 U/L (ref 39–117)
ALT: 11 U/L (ref 0–53)
AST: 9 U/L (ref 0–37)
BILIRUBIN TOTAL: 1.2 mg/dL (ref 0.2–1.2)
Bilirubin, Direct: 0.2 mg/dL (ref 0.0–0.3)
Indirect Bilirubin: 1 mg/dL (ref 0.2–1.2)
Total Protein: 6.7 g/dL (ref 6.0–8.3)

## 2014-01-14 LAB — LIPID PANEL
CHOL/HDL RATIO: 4.9 ratio
CHOLESTEROL: 215 mg/dL — AB (ref 0–200)
HDL: 44 mg/dL (ref >39)
LDL Cholesterol: 144 mg/dL — ABNORMAL HIGH (ref 0–99)
TRIGLYCERIDES: 136 mg/dL (ref <150)
VLDL: 27 mg/dL (ref 0–40)

## 2014-01-14 LAB — POCT URINALYSIS DIPSTICK
Bilirubin, UA: NEGATIVE
Glucose, UA: 100
Ketones, UA: NEGATIVE
LEUKOCYTES UA: NEGATIVE
Nitrite, UA: NEGATIVE
RBC UA: NEGATIVE
Spec Grav, UA: 1.02
UROBILINOGEN UA: NEGATIVE
pH, UA: 5

## 2014-01-14 LAB — CBC
HCT: 39.6 % (ref 39.0–52.0)
HEMOGLOBIN: 13.5 g/dL (ref 13.0–17.0)
MCH: 33.2 pg (ref 26.0–34.0)
MCHC: 34.1 g/dL (ref 30.0–36.0)
MCV: 97.3 fL (ref 78.0–100.0)
PLATELETS: 223 10*3/uL (ref 150–400)
RBC: 4.07 MIL/uL — AB (ref 4.22–5.81)
RDW: 12.9 % (ref 11.5–15.5)
WBC: 5.7 10*3/uL (ref 4.0–10.5)

## 2014-01-14 LAB — BASIC METABOLIC PANEL
BUN: 10 mg/dL (ref 6–23)
CALCIUM: 9.4 mg/dL (ref 8.4–10.5)
CHLORIDE: 101 meq/L (ref 96–112)
CO2: 26 mEq/L (ref 19–32)
CREATININE: 0.85 mg/dL (ref 0.50–1.35)
Glucose, Bld: 323 mg/dL — ABNORMAL HIGH (ref 70–99)
Potassium: 4.1 mEq/L (ref 3.5–5.3)
Sodium: 135 mEq/L (ref 135–145)

## 2014-01-14 MED ORDER — OXYCODONE-ACETAMINOPHEN 5-325 MG PO TABS: 1.0000 | ORAL_TABLET | ORAL | Status: DC | PRN

## 2014-01-14 MED ORDER — LOSARTAN POTASSIUM 100 MG PO TABS: 100.0000 mg | ORAL_TABLET | Freq: Every day | ORAL | Status: DC

## 2014-01-14 MED ORDER — TADALAFIL 20 MG PO TABS: 20.0000 mg | ORAL_TABLET | Freq: Every day | ORAL | Status: DC | PRN

## 2014-01-14 MED ORDER — ROSUVASTATIN CALCIUM 20 MG PO TABS: 20.0000 mg | ORAL_TABLET | Freq: Every day | ORAL | Status: DC

## 2014-01-14 MED ORDER — TERBINAFINE HCL 1 % EX CREA: 1.0000 "application " | TOPICAL_CREAM | Freq: Two times a day (BID) | CUTANEOUS | Status: DC

## 2014-01-14 NOTE — Patient Instructions (Addendum)
Thank you for giving me the opportunity to serve you today.    Your diagnosis today includes: Encounter Diagnoses  Name Primary?  . Routine general medical examination at a health care facility Yes  . Noncompliance   . Type II or unspecified type diabetes mellitus without mention of complication, uncontrolled   . Essential hypertension, benign   . Dyslipidemia   . Erectile dysfunction, unspecified erectile dysfunction type   . Other diabetic neurological complication associated with type 2 diabetes mellitus      Specific recommendations today include:  Given that you're diabetic, you need to see your eye doctor yearly for routine eye exam and screening for diabetic retinopathy  Similarly, you need to see a dentist yearly for routine care  Your eye exam was abnormal today  For diabetes we recommend the following vaccines, influenza vaccine yearly, pneumococcal vaccine, tetanus diphtheria and pertussis vaccine, hepatitis B vaccine  To prevent complications of diabetes we recommend that your diabetes be under control, blood pressure under control, cholesterol within the acceptable limits  Recommend you exercise most days of the week  I recommend a healthy diabetic diet  You are due for repeat colonoscopy in 2018  Begin Lamisil cream for the jock itch  Please check back with Dr. Despina Hick regarding knee pain  Return pending labs.    I have included other useful information below for your review.  Type 2 Diabetes  Diabetes is a long-lasting (chronic) disease.  With diabetes, either the pancreas does not make enough of a hormone called insulin, or the body has trouble using the insulin that is made.  Over time, diabetes can damage the eyes, kidneys, and nerves causing retinopathy, nephropathy, and neuropathy.  Diabetes puts you at risk for heart disease and peripheral vascular disease which can lead to heart attack, stroke, foot ulcers, and amputations.    Our goal and  hopefully your goal is to manage your diabetes in such a way to slow the progression of the disease and do all we can to keep you healthy  Home Care:   Eat healthy, exercise regularly, limit alcohol, and don't smoke!  Check your blood sugar (glucose) once a day before breakfast, or as indicated by our discussion today.  Take your medications daily, don't run out of medications.  Learn about low blood sugar (hypoglycemia). Know how to treat it.  Wear a necklace or bracelet that says you have diabetes.  Check your feet every night for cuts, sores, blisters, and redness. Tell your medical provider if you have problems.  Maintain a normal body weight, or normal BMI - height to weight ratio of 20-25.  Ask me about this.  GET HELP RIGHT AWAY IF:  You have trouble keeping your blood sugar in target range.  You have problems with your medicines.  You are sick and not getting better after 24 hours.  You have a sore or wound that is not healing.  You have vision problems or changes.  You have a fever.  Diet: healthy ADA, American Diabetes Association diet  Exercise regularly since it has beneficial effects on the heart and blood sugars. Exercising at least three times per week or 150 minutes per week can be as important as medication to a diabetic.  Find some form of exercise that you will enjoy doing regularly.  This can include walking, biking, kayaking, golfing, swimming, dance, aerobics, hiking, etc.  If you have joint problems, many local gyms have equipment to accommodate people with specific needs.  Vaccinations:  Diabetics are at increased risk for infection, and illnesses can take longer to resolve.  Current vaccine recommendations include yearly Influenza (flu) vaccine (recommended in October), Pneumococcal vaccine, Hepatitis B vaccine series, Tdap (tetanus, diptheria and pertussis) vaccine every 10 years, and other age appropriate vaccinations.     Office visits:  We  recommend routine medical care to make sure we are addressing prevention and issues as they arise.  Typically this could mean twice yearly or up to quarterly depending upon your unique health situation.  Exams should include a yearly physical, a yearly foot exam, and other examination as appropriate.  You should see an eye doctor yearly to help screen for and prevent blood vessel complications in your eyes.  Labs: Diabetics should have blood work done at least twice yearly to monitor your Hemoglobin A1C (a three-month average of your blood sugars) and your cholesterol.  You should have your urine and blood checked yearly to screen for kidney damage.  This may include creatinine and micro-albumin levels.  Other labs as appropriate.    Blood pressure goals:  Goal blood pressure in diabetics should be 130/80 or less. Monitoring your blood pressure with a home blood pressure cuff of your own is an excellent idea.  If you are prescribed medication for blood pressure, take your medication every day, and don't run out of medication.  Having high blood pressure can damage your heart, eyes, kidneys, and put you at risk for heart attack and stroke.  Tobacco use:  If you smoke, dip or chew, quitting will reduce your risk of heart attack, stroke, peripheral vascular disease, and many cancers.

## 2014-01-14 NOTE — Progress Notes (Deleted)
a 

## 2014-01-14 NOTE — Progress Notes (Signed)
Subjective:   HPI  Jerry MillingBarry D [name] is a 58 y.o. male who presents for a complete physical.  Medical care team includes:  Dr. Peter SwazilandJordan, cardiology  Dr. Russella DarStark, gastroenterology  Sees dentist  No eye doctor  Kristian CoveyShane Lot Medford, PA-C here for primary care  Dentist - Has teeth cleaned every year Annual eye exam - 3 years ago, does not have an ophthalmologist.  DM - reports 100% medication compliance. Denies adverse effects including GI disturbance, metallic taste, dizziness, n/v. Checks blood sugar every morning, usually at 120. Checks feet every day, no skin infections or ulcerations in feet, denies peripheral neuropathy. Eats 3 meals a day, salads, vegetables, fruits (apples and bananas), eats 1 sandwich, avoids salty foods, added salt. Exercises daily walks on the treadmill, does some weight bearing exercise. Patient does not smoke or drink alcohol.  HTN - reports 100% medication compliance. Denies adverse effects including dizziness, n/v. Checks it once every other week in the morning, states that it normally is in the 150's. Diet and exercise as above.  HL - reports that he was done with his medication. He ran out 4 months ago. Has not taken anything for his cholesterol since then. Diet and exercise as above.  ED - patient states that the Androgel did not work. He is not interested in Androgel and wants to take Cialis. States that it very worked well. Wants more samples.  Knee pain - states that he has ongoing knee pain bilaterally. Pain is usually every other day, made worse with weather, cold. Has not taken any medications for it - used to take Vicodin which significant relief. Lately, he has been using heat pads and elevating his knees with some relief. [place] - would like pain medication for his travel which makes his pain worse.  Immunizations - last TDap, pneumococcal unknown  Prostate - is adamant about not having his prostate checked. Denies family  history of prostate cancer, urinary symptoms.   Has jock itch, using neosporin OTC, but not working.  Preventative care: Last ophthalmology visit:n/a Last dental visit: yearly Last colonoscopy:08/2006 Last prostate exam:  Last EKG:07/2006 Last labs:?  Prior vaccinations: TD or Tdap:? Influenza:yes Pneumococcal: n/a Shingles/Zostavax:n/a  Advanced directive:n/a Health care power of attorney:n/a Living will:n/a   Reviewed their medical, surgical, family, social, medication, and allergy history and updated chart as appropriate.  Past Medical History  Diagnosis Date  . Dyslipidemia   . Arthritis   . Hypogonadism male   . HTN (hypertension)   . Diabetes mellitus 2008    type II  . Erectile dysfunction   . History of cardiac catheterization 06/2013    Dr. Peter SwazilandJordan  . Coronary artery disease     nonobstructive per cath 12/14.  normal LV function, falsely abnormal myoview stress test; Dr. Peter SwazilandJordan    Past Surgical History  Procedure Laterality Date  . Incision and drainage perirectal abscess    . Knee surgery      left knee - remote past  . Colonoscopy  08/2006    Dr. Russella DarStark - repeat 2018    History   Social History  . Marital Status: Married    Spouse Name: N/A    Number of Children: N/A  . Years of Education: N/A   Occupational History  . retired     prior physical therapist   Social History Main Topics  . Smoking status: Former Smoker    Types: Cigarettes    Quit date: 11/11/2001  .  Smokeless tobacco: Never Used  . Alcohol Use: No  . Drug Use: No  . Sexual Activity: No   Other Topics Concern  . Not on file   Social History Narrative   Married 22 years, 2 daughter and 1 son. Retired, used to work as a PT.  Exercises treadmill daily, some weight bearing exercise. Diet     Family History  Problem Relation Age of Onset  . Diabetes Mother   . Other Father     death by MVA  . Other Brother     died of MVA  . Stroke Neg Hx   . Heart disease  Neg Hx   . Cancer Neg Hx   . Hypertension Neg Hx     Current outpatient prescriptions:amLODipine (NORVASC) 5 MG tablet, Take 1 tablet (5 mg total) by mouth daily., Disp: 180 tablet, Rfl: 3;  aspirin EC 81 MG tablet, Take 1 tablet (81 mg total) by mouth daily., Disp: 30 tablet, Rfl: 6;  carvedilol (COREG) 25 MG tablet, Take 1 tablet (25 mg total) by mouth 2 (two) times daily., Disp: 180 tablet, Rfl: 3 Insulin Glargine (LANTUS SOLOSTAR) 100 UNIT/ML SOPN, Inject 10 Units into the skin at bedtime., Disp: , Rfl: ;  metFORMIN (GLUCOPHAGE) 850 MG tablet, Take 1 tablet (850 mg total) by mouth 2 (two) times daily with a meal. 1 tab twice a day, Disp: 60 tablet, Rfl: 0;  tadalafil (CIALIS) 20 MG tablet, Take 1 tablet (20 mg total) by mouth daily as needed for erectile dysfunction., Disp: 3 tablet, Rfl: 0 gabapentin (NEURONTIN) 100 MG capsule, Begin 2 capsules at bedtime for a week, then add on 1 capsule in the morning daily, Disp: 90 capsule, Rfl: 0;  oxyCODONE-acetaminophen (ROXICET) 5-325 MG per tablet, Take 1 tablet by mouth every 4 (four) hours as needed for severe pain., Disp: 15 tablet, Rfl: 0;  terbinafine (LAMISIL AT) 1 % cream, Apply 1 application topically 2 (two) times daily., Disp: 30 g, Rfl: 0  Allergies  Allergen Reactions  . Lisinopril Cough       Review of Systems Constitutional: -fever, -chills, -sweats, -unexpected weight change, -decreased appetite, -fatigue Allergy: -sneezing, -itching, -congestion Dermatology: -changing moles, +rash, +lumps ENT: -runny nose, -ear pain, -sore throat, -hoarseness, -sinus pain, -teeth pain, - ringing in ears, -hearing loss, -nosebleeds Cardiology: -chest pain, -palpitations, -swelling, -difficulty breathing when lying flat, -waking up short of breath Respiratory: -cough, -shortness of breath, -difficulty breathing with exercise or exertion, -wheezing, -coughing up blood Gastroenterology: -abdominal pain, -nausea, -vomiting, -diarrhea, -constipation,  -blood in stool, -changes in bowel movement, -difficulty swallowing or eating Hematology: -bleeding, -bruising  Musculoskeletal: +joint aches, -muscle aches, -joint swelling, -back pain, -neck pain, -cramping, -changes in gait Ophthalmology: denies vision changes, eye redness, itching, discharge Urology: -burning with urination, -difficulty urinating, -blood in urine, -urinary frequency, -urgency, -incontinence Neurology: -headache, -weakness, -tingling, +numbness, -memory loss, -falls, -dizziness Psychology: -depressed mood, -agitation, -sleep problems     Objective:   Physical Exam  BP 130/80  Pulse 92  Temp(Src) 97.8 F (36.6 C) (Oral)  Resp 14  Ht 6\' 4"  (1.93 m)  Wt 273 lb (123.832 kg)  BMI 33.24 kg/m2  General appearance: alert, no distress, WD/WN, obese AA male Skin: scattered macules thorughout, skin tags of neck bilat, no worrisome lesions in general HEENT: normocephalic, conjunctiva/corneas normal, sclerae anicteric, PERRLA, EOMi, nares patent, no discharge or erythema, pharynx normal Oral cavity: MMM, tongue normal, teeth - upper denture, otherwise teeth in good repair Neck: supple, no lymphadenopathy, no  thyromegaly, no masses, normal ROM, no bruits Chest: non tender, normal shape and expansion Heart: RRR, normal S1, S2, no murmurs Lungs: CTA bilaterally, no wheezes, rhonchi, or rales Abdomen: +bs, soft, RLQ surgical scar, non tender, non distended, no masses, no hepatomegaly, no splenomegaly, no bruits Back: non tender, normal ROM, no scoliosis Musculoskeletal: left knee surgical scars, knees bilat tender, bony arthritis changes, initially had knee sleeves on, pain transferring out of the chair, otherwise upper extremities non tender, no obvious deformity, normal ROM throughout, lower extremities non tender, no obvious deformity, normal ROM throughout Extremities: no edema, no cyanosis, no clubbing Pulses: 2+ symmetric, upper and lower extremities, normal cap  refill Neurological: alert, oriented x 3, CN2-12 intact, strength normal upper extremities and lower extremities, sensation normal throughout, DTRs 2+ throughout, no cerebellar signs, gait normal Psychiatric: normal affect, behavior normal, pleasant  GU: normal male external genitalia, circumcised, pink/red contiguous rash in between thighs and including scrotum suggestive of tinea cruris, nontender, no masses, no hernia, no lymphadenopathy Rectal: declines adamantly   Assessment and Plan :      Encounter Diagnoses  Name Primary?  . Routine general medical examination at a health care facility Yes  . Noncompliance   . Type II or unspecified type diabetes mellitus without mention of complication, uncontrolled   . Essential hypertension, benign   . Dyslipidemia   . Erectile dysfunction, unspecified erectile dysfunction type   . Need for Tdap vaccination   . Need for prophylactic vaccination against Streptococcus pneumoniae (pneumococcus)   . Need for hepatitis B vaccination   . Tinea cruris   . Obesity, unspecified     Physical exam - discussed healthy lifestyle, diet, exercise, preventative care, vaccinations, and addressed their concerns.    We discussed vaccinations, recommended yearly flu vaccine, recommended pneumococcal vaccine, recommended Tdap vaccine, recommended hepatitis B vaccine  Counseled on the Hepatitis B virus vaccine.  Vaccine information sheet given.  Hepatitis B vaccine given after consent obtained.  Patient was advised to return in 2 months for Hep B #2, and in 6 months for Hep B #3.    Counseled on the Tdap (tetanus, diptheria, and acellular pertussis) vaccine.  Vaccine information sheet given. Tdap vaccine given after consent obtained.  Counseled on the pneumococcal vaccine.  Vaccine information sheet given.  Pneumococcal vaccine given after consent obtained.  Noncompliance - he hasn't come in since June of last year for routine diabetes followup, reports being  compliant with medication diet and exercise at this time  Diabetes type 2-labs today, continue Lantus 10 units QHS, continue metformin 850 mg twice daily.  Discussed importance of good diabetic control, healthy diabetic diet, exercise, discussed possible complications of diabetes  Hypertension-continue Coreg 25 mg twice daily, amlodipine 5 mg daily.  He was confused about Losartan, but after reviewing cardiology notes, and given the fact he is diabetic, add back Losartan 100 mg daily. Discussed importance of good blood pressure control given his newly diagnosed coronary artery disease, given his history of hypertension diabetes as well. Discussed complications of poorly controlled hypertension and diabetes  Dyslipidemia-discussed importance of good cholesterol control, risk factors for heart disease, begin Crestor  Erectile dysfunction-does well on Cialis  Begin Lamisil cream for tinea cruris.   Obesity - needs to work on weight loss.  Knee pain - again advised I will not c/t chronic narcotics for his knees.  We will request the ortho records from where I referred him.   Gave short supply of pain medication today.  Follow-up  pending labs

## 2014-01-15 LAB — HEMOGLOBIN A1C
Hgb A1c MFr Bld: 8.7 % — ABNORMAL HIGH (ref <5.7)
Mean Plasma Glucose: 203 mg/dL — ABNORMAL HIGH (ref <117)

## 2014-01-15 LAB — MICROALBUMIN / CREATININE URINE RATIO
Creatinine, Urine: 371.2 mg/dL
MICROALB/CREAT RATIO: 13.9 mg/g (ref 0.0–30.0)
Microalb, Ur: 5.17 mg/dL — ABNORMAL HIGH (ref 0.00–1.89)

## 2014-01-15 LAB — TSH: TSH: 1.841 u[IU]/mL (ref 0.350–4.500)

## 2014-01-17 ENCOUNTER — Other Ambulatory Visit: Payer: Self-pay | Admitting: Medical

## 2014-01-17 DIAGNOSIS — R9431 Abnormal electrocardiogram [ECG] [EKG]: Secondary | ICD-10-CM

## 2014-01-17 DIAGNOSIS — Z01812 Encounter for preprocedural laboratory examination: Secondary | ICD-10-CM

## 2014-01-17 DIAGNOSIS — R9439 Abnormal result of other cardiovascular function study: Secondary | ICD-10-CM

## 2014-01-17 DIAGNOSIS — R0600 Dyspnea, unspecified: Secondary | ICD-10-CM

## 2014-01-17 DIAGNOSIS — I10 Essential (primary) hypertension: Secondary | ICD-10-CM

## 2014-01-17 DIAGNOSIS — E119 Type 2 diabetes mellitus without complications: Secondary | ICD-10-CM

## 2014-01-17 DIAGNOSIS — Z79899 Other long term (current) drug therapy: Secondary | ICD-10-CM

## 2014-01-17 DIAGNOSIS — R079 Chest pain, unspecified: Secondary | ICD-10-CM

## 2014-01-17 MED ORDER — INSULIN GLARGINE 100 UNIT/ML SOLOSTAR PEN: PEN_INJECTOR | SUBCUTANEOUS | Status: DC

## 2014-01-17 MED ORDER — CARVEDILOL 25 MG PO TABS: 25.0000 mg | ORAL_TABLET | Freq: Two times a day (BID) | ORAL | Status: DC

## 2014-01-17 MED ORDER — METFORMIN HCL 1000 MG PO TABS: 1000.0000 mg | ORAL_TABLET | Freq: Two times a day (BID) | ORAL | Status: DC

## 2014-01-17 MED ORDER — ASPIRIN EC 81 MG PO TBEC: 81.0000 mg | DELAYED_RELEASE_TABLET | Freq: Every day | ORAL | Status: DC

## 2014-01-17 MED ORDER — AMLODIPINE BESYLATE 5 MG PO TABS: 5.0000 mg | ORAL_TABLET | Freq: Every day | ORAL | Status: DC

## 2014-01-17 NOTE — Addendum Note (Signed)
Addended by: Jac Canavan on: 01/17/2014 07:50 PM   Modules accepted: Level of Service

## 2014-01-18 ENCOUNTER — Telehealth: Payer: Self-pay | Admitting: Medical

## 2014-01-18 NOTE — Telephone Encounter (Signed)
Left message on pt VM TCB 

## 2014-01-18 NOTE — Telephone Encounter (Signed)
Received documentation from the orthopedic office.  They actually recommended steroid injections versus surgery for his knees.   In his words the orthopedic told him there wasn't much they could do for him. That is very different any records  I read from orthopedics.   Please have him followup with orthopedics regarding his knee pain.  I will not be able to refill any more hydrocodone

## 2014-02-08 NOTE — Telephone Encounter (Signed)
What is the status?

## 2014-02-09 ENCOUNTER — Other Ambulatory Visit: Payer: Self-pay | Admitting: Medical

## 2014-02-11 ENCOUNTER — Other Ambulatory Visit: Payer: Self-pay | Admitting: Family Medicine

## 2014-02-11 MED ORDER — METFORMIN HCL 1000 MG PO TABS: 1000.0000 mg | ORAL_TABLET | Freq: Two times a day (BID) | ORAL | Status: DC

## 2014-02-11 NOTE — Telephone Encounter (Signed)
Patient states that he is not going to have the steroid injections. He said that he had this before and it mess him up and he believes that's the reason he has diabetes to this day. I explain to him that he will need to follow up with ortho. For further refills on pain medication. CLS

## 2014-02-23 ENCOUNTER — Ambulatory Visit: Payer: Self-pay | Admitting: Medical

## 2014-02-24 ENCOUNTER — Ambulatory Visit: Payer: Self-pay | Admitting: Medical

## 2014-04-04 ENCOUNTER — Other Ambulatory Visit: Payer: Self-pay | Admitting: Medical

## 2014-04-04 ENCOUNTER — Telehealth: Payer: Self-pay | Admitting: Medical

## 2014-04-04 MED ORDER — ATORVASTATIN CALCIUM 80 MG PO TABS: 80.0000 mg | ORAL_TABLET | Freq: Every day | ORAL | Status: DC

## 2014-04-04 NOTE — Telephone Encounter (Signed)
[employer] not approving crestor.  Not sure why we are just now getting this, but I sent Lipitor.  Have him change to Lipitor when he runs out of crestor.   Recheck in 77mo on Lipitor, fasting visit, sooner visit if needed for other issues.

## 2014-04-05 NOTE — Telephone Encounter (Signed)
Patients wife is aware of the change in the medication when I spoke to her about the Cialis samples for her husband. CLS

## 2014-06-10 ENCOUNTER — Telehealth: Payer: Self-pay | Admitting: Internal Medicine

## 2014-06-10 ENCOUNTER — Other Ambulatory Visit: Payer: Self-pay | Admitting: Family Medicine

## 2014-06-10 MED ORDER — GABAPENTIN 100 MG PO CAPS: 100.0000 mg | ORAL_CAPSULE | Freq: Three times a day (TID) | ORAL | Status: DC

## 2014-06-10 NOTE — Telephone Encounter (Signed)
Rx refill sent per David Tysinger PAC 

## 2014-06-10 NOTE — Telephone Encounter (Signed)
pls send refill 

## 2014-06-10 NOTE — Telephone Encounter (Signed)
Refill request for gabapentin 100mg  #90 to [facility] [address]

## 2014-06-16 ENCOUNTER — Encounter (HOSPITAL_COMMUNITY): Payer: Self-pay | Admitting: Cardiology

## 2014-06-21 ENCOUNTER — Encounter (HOSPITAL_COMMUNITY): Payer: Self-pay | Admitting: *Deleted

## 2014-06-21 ENCOUNTER — Emergency Department (INDEPENDENT_AMBULATORY_CARE_PROVIDER_SITE_OTHER)
Admission: EM | Admit: 2014-06-21 | Discharge: 2014-06-21 | Disposition: A | Discharge status: Home / Self Care | Payer: Medicare Other | Source: Home / Self Care | Attending: Family Medicine | Admitting: Family Medicine

## 2014-06-21 DIAGNOSIS — L03211 Cellulitis of face: Secondary | ICD-10-CM

## 2014-06-21 MED ORDER — OXYCODONE-ACETAMINOPHEN 5-325 MG PO TABS: 1.0000 | ORAL_TABLET | Freq: Four times a day (QID) | ORAL | Status: DC | PRN

## 2014-06-21 MED ORDER — MINOCYCLINE HCL 100 MG PO CAPS: 100.0000 mg | ORAL_CAPSULE | Freq: Two times a day (BID) | ORAL | Status: DC

## 2014-06-21 NOTE — Discharge Instructions (Signed)
Warm soak , sponge antiseptic wash 2-3 times a day, take medicine as prescribed, return if needed.

## 2014-06-21 NOTE — ED Provider Notes (Signed)
CSN: [id number]     Arrival date & time 06/21/14  1022 History   First MD Initiated Contact with Patient 06/21/14 1035     Chief Complaint  Patient presents with  . Abscess   (Consider location/radiation/quality/duration/timing/severity/associated sxs/prior Treatment) Patient is a 58 y.o. male presenting with abscess. The history is provided by the patient and the spouse.  Abscess Location:  Face Facial abscess location:  Chin Abscess quality: induration, painful and redness   Red streaking: no   Duration:  3 weeks Progression:  Worsening Pain details:    Quality:  Throbbing   Severity:  Mild Chronicity:  New Context: diabetes   Associated symptoms: no fever   Risk factors: no hx of MRSA and no prior abscess     Past Medical History  Diagnosis Date  . Dyslipidemia   . Arthritis   . Hypogonadism male   . HTN (hypertension)   . Diabetes mellitus 2008    type II  . Erectile dysfunction   . History of cardiac catheterization 06/2013    Dr. Peter Swaziland  . Coronary artery disease     nonobstructive per cath 12/14.  normal LV function, falsely abnormal myoview stress test; Dr. Peter Swaziland   Past Surgical History  Procedure Laterality Date  . Incision and drainage perirectal abscess    . Knee surgery      left knee - remote past  . Colonoscopy  08/2006    Dr. Russella Dar - repeat 2018  . Left heart catheterization with coronary angiogram N/A 06/11/2013    Procedure: LEFT HEART CATHETERIZATION WITH CORONARY ANGIOGRAM;  Surgeon: Peter M Swaziland, MD;  Location: [facility] CATH LAB;  Service: Cardiovascular;  Laterality: N/A;   Family History  Problem Relation Age of Onset  . Diabetes Mother   . Other Father     death by MVA  . Other Brother     died of MVA  . Stroke Neg Hx   . Heart disease Neg Hx   . Cancer Neg Hx   . Hypertension Neg Hx    History  Substance Use Topics  . Smoking status: Former Smoker    Types: Cigarettes    Quit date: 11/11/2001  . Smokeless tobacco: Never  Used  . Alcohol Use: No    Review of Systems  Constitutional: Negative.  Negative for fever.  Skin: Positive for rash.    Allergies  Lisinopril  Home Medications   Prior to Admission medications   Medication Sig Start Date End Date Taking? Authorizing Provider  amLODipine (NORVASC) 5 MG tablet Take 1 tablet (5 mg total) by mouth daily. 01/17/14   Jac Canavan, PA-C  aspirin EC 81 MG tablet Take 1 tablet (81 mg total) by mouth daily. 01/17/14   Kermit Balo Tysinger, PA-C  atorvastatin (LIPITOR) 80 MG tablet Take 1 tablet (80 mg total) by mouth daily. 04/04/14   Kermit Balo Tysinger, PA-C  carvedilol (COREG) 25 MG tablet Take 1 tablet (25 mg total) by mouth 2 (two) times daily. 01/17/14   Kermit Balo Tysinger, PA-C  gabapentin (NEURONTIN) 100 MG capsule Take 1 capsule (100 mg total) by mouth 3 (three) times daily. 06/10/14   Kermit Balo Tysinger, PA-C  Insulin Glargine (LANTUS SOLOSTAR) 100 UNIT/ML Solostar Pen 20 units QHS 01/17/14   Kermit Balo Tysinger, PA-C  losartan (COZAAR) 100 MG tablet Take 1 tablet (100 mg total) by mouth daily. 01/14/14   Kermit Balo Tysinger, PA-C  metFORMIN (GLUCOPHAGE) 1000 MG tablet Take 1 tablet (1,000  mg total) by mouth 2 (two) times daily with a meal. 02/11/14   Kermit Balo Tysinger, PA-C  minocycline (MINOCIN,DYNACIN) 100 MG capsule Take 1 capsule (100 mg total) by mouth 2 (two) times daily. 06/21/14   Linna Hoff, MD  oxyCODONE-acetaminophen (PERCOCET/ROXICET) 5-325 MG per tablet Take 1 tablet by mouth every 6 (six) hours as needed for moderate pain or severe pain. 06/21/14   Linna Hoff, MD  tadalafil (CIALIS) 20 MG tablet Take 1 tablet (20 mg total) by mouth daily as needed for erectile dysfunction. 01/14/14   Kermit Balo Tysinger, PA-C  terbinafine (LAMISIL AT) 1 % cream Apply 1 application topically 2 (two) times daily. 01/14/14   Kermit Balo Tysinger, PA-C   BP 170/108 mmHg  Pulse 78  Temp(Src) 98.6 F (37 C)  Resp 18  SpO2 100% Physical Exam  Constitutional: He is oriented to  person, place, and time. He appears well-developed and well-nourished.  Musculoskeletal: Normal range of motion.  Neurological: He is alert and oriented to person, place, and time.  Skin: Skin is warm and dry. Rash noted. There is erythema.  Follicular indurated 2cm patch to left chin.  Nursing note and vitals reviewed.   ED Course  INCISION AND DRAINAGE Date/Time: 06/21/2014 11:05 AM Performed by: Linna Hoff Authorized by: Bradd Canary D Consent: Verbal consent obtained. Risks and benefits: risks, benefits and alternatives were discussed Consent given by: patient Type: abscess Body area: head/neck Location details: face Local anesthetic: topical anesthetic Scalpel size: 11 Incision type: single straight Complexity: simple Drainage: purulent Drainage amount: scant Wound treatment: wound left open Patient tolerance: Patient tolerated the procedure well with no immediate complications   (including critical care time) Labs Review Labs Reviewed  WOUND CULTURE    Imaging Review No results found.   MDM   1. Facial cellulitis        Linna Hoff, MD 06/21/14 [phone]

## 2014-06-21 NOTE — ED Notes (Signed)
Pt  Has   Swollen    Area       l  Side  Face   X   sev  Weeks     -  The  Area    Is  Tender  To  The  Touch        -       The  Area    Appears  To  Be  A  Boil

## 2014-06-24 LAB — WOUND CULTURE

## 2014-06-27 NOTE — ED Notes (Signed)
Abscess culture L cheek: Few Staph species (coagulase neg.).  Lab shown to Dr. Artis Flock and he said it is contaminant. Pt. adequately treated with I and D and Minocycline. Vassie Moselle 06/27/2014

## 2014-07-12 ENCOUNTER — Emergency Department (HOSPITAL_COMMUNITY): Payer: Medicare Other

## 2014-07-12 ENCOUNTER — Emergency Department (HOSPITAL_COMMUNITY)
Admission: EM | Admit: 2014-07-12 | Discharge: 2014-07-12 | Disposition: A | Discharge status: Home / Self Care | Payer: Medicare Other | Attending: Emergency Medicine | Admitting: Emergency Medicine

## 2014-07-12 ENCOUNTER — Encounter (HOSPITAL_COMMUNITY): Payer: Self-pay | Admitting: Emergency Medicine

## 2014-07-12 DIAGNOSIS — E119 Type 2 diabetes mellitus without complications: Secondary | ICD-10-CM | POA: Insufficient documentation

## 2014-07-12 DIAGNOSIS — M1990 Unspecified osteoarthritis, unspecified site: Secondary | ICD-10-CM | POA: Insufficient documentation

## 2014-07-12 DIAGNOSIS — M79605 Pain in left leg: Secondary | ICD-10-CM | POA: Diagnosis not present

## 2014-07-12 DIAGNOSIS — Z7982 Long term (current) use of aspirin: Secondary | ICD-10-CM | POA: Diagnosis not present

## 2014-07-12 DIAGNOSIS — I2510 Atherosclerotic heart disease of native coronary artery without angina pectoris: Secondary | ICD-10-CM | POA: Diagnosis not present

## 2014-07-12 DIAGNOSIS — Z9889 Other specified postprocedural states: Secondary | ICD-10-CM | POA: Diagnosis not present

## 2014-07-12 DIAGNOSIS — I10 Essential (primary) hypertension: Secondary | ICD-10-CM | POA: Insufficient documentation

## 2014-07-12 DIAGNOSIS — M25562 Pain in left knee: Secondary | ICD-10-CM | POA: Diagnosis not present

## 2014-07-12 DIAGNOSIS — Z794 Long term (current) use of insulin: Secondary | ICD-10-CM | POA: Diagnosis not present

## 2014-07-12 DIAGNOSIS — Z87438 Personal history of other diseases of male genital organs: Secondary | ICD-10-CM | POA: Diagnosis not present

## 2014-07-12 DIAGNOSIS — M1712 Unilateral primary osteoarthritis, left knee: Secondary | ICD-10-CM | POA: Diagnosis not present

## 2014-07-12 DIAGNOSIS — E785 Hyperlipidemia, unspecified: Secondary | ICD-10-CM | POA: Insufficient documentation

## 2014-07-12 DIAGNOSIS — Z79899 Other long term (current) drug therapy: Secondary | ICD-10-CM | POA: Insufficient documentation

## 2014-07-12 DIAGNOSIS — M25569 Pain in unspecified knee: Secondary | ICD-10-CM

## 2014-07-12 DIAGNOSIS — Z792 Long term (current) use of antibiotics: Secondary | ICD-10-CM | POA: Diagnosis not present

## 2014-07-12 LAB — CBG MONITORING, ED: GLUCOSE-CAPILLARY: 194 mg/dL — AB (ref 70–99)

## 2014-07-12 MED ORDER — KETOROLAC TROMETHAMINE 60 MG/2ML IM SOLN
30.0000 mg | Freq: Once | INTRAMUSCULAR | Status: AC
  Administered 2014-07-12: 30 mg via INTRAMUSCULAR
  Filled 2014-07-12: qty 2

## 2014-07-12 MED ORDER — OXYCODONE-ACETAMINOPHEN 5-325 MG PO TABS: 1.0000 | ORAL_TABLET | ORAL | Status: DC | PRN

## 2014-07-12 NOTE — ED Notes (Signed)
Pt states he is having pain in his left knee  Pt states pain started on Saturday  Pt denies injury  Pt states it hurts to walk on it or bend it  Pt states he has been using ice, heat, and cream without relief  Mild swelling noted

## 2014-07-12 NOTE — ED Notes (Signed)
Patient initially stated that he did not want to stay for imaging After patient medicated, patient now agrees to stay  Patient in NAD

## 2014-07-12 NOTE — ED Notes (Signed)
Ortho tech aware of order for knee sleeve

## 2014-07-12 NOTE — ED Provider Notes (Signed)
CSN: [id number]     Arrival date & time 07/12/14  1919 History  This chart was scribed for non-physician practitioner, Jerry Chambers, PA-C working with Jerry Mocha, MD by Greggory Stallion, ED scribe. This patient was seen in room WTR7/WTR7 and the patient's care was started at 8:43 PM.    Chief Complaint  Patient presents with  . Knee Pain   The history is provided by the patient. No language interpreter was used.    HPI Comments: Jerry [name] is a 59 y.o. male with history of diabetes who presents to the Emergency Department complaining of worsening left knee pain that radiates into the lateral aspect of his lower leg that started 3 days ago. Denies injury or twisting. Rates pain 8/10 and states bearing weight and bending his knee worsen it. Pt has used ice, heat and a cream with no relief. Denies leg swelling, calf pain, numbness or tingling. His blood sugars have been running around 200 at home. Reports surgery to his right knee several years ago for a torn ligament. Denies history of DVT or PE. Denies recent long distance travel.   Past Medical History  Diagnosis Date  . Dyslipidemia   . Arthritis   . Hypogonadism male   . HTN (hypertension)   . Diabetes mellitus 2008    type II  . Erectile dysfunction   . History of cardiac catheterization 06/2013    Dr. Peter Swaziland  . Coronary artery disease     nonobstructive per cath 12/14.  normal LV function, falsely abnormal myoview stress test; Dr. Peter Swaziland   Past Surgical History  Procedure Laterality Date  . Incision and drainage perirectal abscess    . Knee surgery      left knee - remote past  . Colonoscopy  08/2006    Dr. Russella Dar - repeat 2018  . Left heart catheterization with coronary angiogram N/A 06/11/2013    Procedure: LEFT HEART CATHETERIZATION WITH CORONARY ANGIOGRAM;  Surgeon: Peter M Swaziland, MD;  Location: [facility] CATH LAB;  Service: Cardiovascular;  Laterality: N/A;   Family History  Problem Relation Age of Onset   . Diabetes Mother   . Other Father     death by MVA  . Other Brother     died of MVA  . Stroke Neg Hx   . Heart disease Neg Hx   . Cancer Neg Hx   . Hypertension Neg Hx    History  Substance Use Topics  . Smoking status: Former Smoker    Types: Cigarettes    Quit date: 11/11/2001  . Smokeless tobacco: Never Used  . Alcohol Use: No    Review of Systems  Constitutional: Negative for fever.  Cardiovascular: Negative for leg swelling.  Gastrointestinal: Negative for abdominal pain.  Musculoskeletal: Positive for myalgias and arthralgias. Negative for back pain and neck pain.  Skin: Negative for rash and wound.  Neurological: Negative for numbness.  All other systems reviewed and are negative.  Allergies  Lisinopril  Home Medications   Prior to Admission medications   Medication Sig Start Date End Date Taking? Authorizing Provider  amLODipine (NORVASC) 5 MG tablet Take 1 tablet (5 mg total) by mouth daily. 01/17/14   Jac Canavan, PA-C  aspirin EC 81 MG tablet Take 1 tablet (81 mg total) by mouth daily. 01/17/14   Kermit Balo Tysinger, PA-C  atorvastatin (LIPITOR) 80 MG tablet Take 1 tablet (80 mg total) by mouth daily. 04/04/14   Jac Canavan, PA-C  carvedilol (COREG) 25 MG tablet Take 1 tablet (25 mg total) by mouth 2 (two) times daily. 01/17/14   Kermit Balo Tysinger, PA-C  gabapentin (NEURONTIN) 100 MG capsule Take 1 capsule (100 mg total) by mouth 3 (three) times daily. 06/10/14   Kermit Balo Tysinger, PA-C  Insulin Glargine (LANTUS SOLOSTAR) 100 UNIT/ML Solostar Pen 20 units QHS 01/17/14   Kermit Balo Tysinger, PA-C  losartan (COZAAR) 100 MG tablet Take 1 tablet (100 mg total) by mouth daily. 01/14/14   Kermit Balo Tysinger, PA-C  metFORMIN (GLUCOPHAGE) 1000 MG tablet Take 1 tablet (1,000 mg total) by mouth 2 (two) times daily with a meal. 02/11/14   Kermit Balo Tysinger, PA-C  minocycline (MINOCIN,DYNACIN) 100 MG capsule Take 1 capsule (100 mg total) by mouth 2 (two) times daily. 06/21/14    Linna Hoff, MD  oxyCODONE-acetaminophen (PERCOCET/ROXICET) 5-325 MG per tablet Take 1-2 tablets by mouth every 4 (four) hours as needed for moderate pain or severe pain. 07/12/14   Einar Gip Dajour [place], PA-C  tadalafil (CIALIS) 20 MG tablet Take 1 tablet (20 mg total) by mouth daily as needed for erectile dysfunction. 01/14/14   Kermit Balo Tysinger, PA-C  terbinafine (LAMISIL AT) 1 % cream Apply 1 application topically 2 (two) times daily. 01/14/14   Kermit Balo Tysinger, PA-C   BP 171/96 mmHg  Pulse 95  Temp(Src) 98 F (36.7 C) (Oral)  Resp 18  Ht 6\' 4"  (1.93 m)  Wt 268 lb (121.564 kg)  BMI 32.64 kg/m2  SpO2 100%   Physical Exam  Constitutional: He is oriented to person, place, and time. He appears well-developed and well-nourished. No distress.  HENT:  Head: Normocephalic and atraumatic.  Eyes: Conjunctivae and EOM are normal.  Neck: Neck supple.  Cardiovascular: Normal rate, regular rhythm and normal heart sounds.   DP and PT pulses intact bilaterally.  Pulmonary/Chest: Effort normal and breath sounds normal. No respiratory distress.  Musculoskeletal: Normal range of motion.  Left knee with no swelling or deformity. No knee instability. Good ROM of knee. No bony point tenderness. No calf tenderness or swelling.   Neurological: He is alert and oriented to person, place, and time.  Skin: Skin is warm and dry.  Psychiatric: He has a normal mood and affect. His behavior is normal.  Nursing note and vitals reviewed.   ED Course  Procedures (including critical care time)  DIAGNOSTIC STUDIES: Oxygen Saturation is 100% on RA, normal by my interpretation.    COORDINATION OF CARE: 8:52 PM-Discussed treatment plan which includes xray and Toradol with pt at bedside and pt agreed to plan. Will give pt a short term prescription for percocet and advised him to follow up with an orthopedist.   Labs Review Labs Reviewed  CBG MONITORING, ED - Abnormal; Notable for the following:     Glucose-Capillary 194 (*)    All other components within normal limits    Imaging Review Dg Tibia/fibula Left  07/12/2014   CLINICAL DATA:  Lateral left knee and tib-fib pain for 3 days after waking on Saturday morning due to the pain. No injury.  EXAM: LEFT TIBIA AND FIBULA - 2 VIEW  COMPARISON:  None.  FINDINGS: There is no evidence of fracture or other focal bone lesions. Soft tissues are unremarkable.  IMPRESSION: Negative.   Electronically Signed   By: Saturday M.D.   On: 07/12/2014 21:24   Dg Knee Complete 4 Views Left  07/12/2014   CLINICAL DATA:  Lateral left knee and tib-fib pain  for 3 days after waking up at home due to pain on Saturday morning. No injury.  EXAM: LEFT KNEE - COMPLETE 4+ VIEW  COMPARISON:  11/10/2012  FINDINGS: Tricompartment degenerative changes in the left knee with joint space narrowing and osteophytosis, greatest in the medial compartment. No significant effusion. No evidence of acute fracture or subluxation. No focal bone lesion or bone destruction. Bone cortex and trabecular architecture appear intact. No radiopaque soft tissue foreign bodies.  IMPRESSION: Mild degenerative changes in the left knee. No acute bony abnormalities.   Electronically Signed   By: Burman Nieves M.D.   On: 07/12/2014 21:23     EKG Interpretation None      Filed Vitals:   07/12/14 1922  BP: 171/96  Pulse: 95  Temp: 98 F (36.7 C)  TempSrc: Oral  Resp: 18  Height: 6\' 4"  (1.93 m)  Weight: 268 lb (121.564 kg)  SpO2: 100%     MDM   Meds given in ED:  Medications  ketorolac (TORADOL) injection 30 mg (30 mg Intramuscular Given 07/12/14 2104)    New Prescriptions   OXYCODONE-ACETAMINOPHEN (PERCOCET/ROXICET) 5-325 MG PER TABLET    Take 1-2 tablets by mouth every 4 (four) hours as needed for moderate pain or severe pain.    Final diagnoses:  Knee pain  Left leg pain   Visit the T75-year-old male with a history of knee pain who presents emergency department complaining  of left lateral knee pain worsening since Saturday. Patient denies trauma or injury. There is no edema noted to the patient's knee or lower [place]. Patient's calves are equal in size. There is no calf tenderness to palpation. Patient's bilateral dorsalis pedis and posterior tibialis pulses are intact. The patient's left knee x-ray indicated mild degenerative changes but no acute bony abnormalities. The patient's tib-fib x-ray is unremarkable. Patient would like a knee sleeve for home use. Patient received Toradol in the ED. Patient is requesting Percocet for pain at home. Patient was provided a prescription for Percocet. Advised patient to use caution while taking Percocet as it can make him drowsy. Advised patient not to drive while taking Percocet.  Advised patient he needs to follow-up with his primary care provider for continued pain in further evaluation. Advised patient return to the emergency room with new worsened symptoms or new concerns. The patient verbalized understanding and agreement with plan.  I personally performed the services described in this documentation, which was scribed in my presence. The recorded information has been reviewed and is accurate.  Monday, PA-C 07/12/14 2217  2218, MD 07/12/14 2325

## 2014-07-12 NOTE — Discharge Instructions (Signed)

## 2014-07-12 NOTE — ED Notes (Signed)
Patient with c/o left knee pain Patient with hx of osteoarthritis Patient ambulatory from ED WR and in NAD

## 2014-07-15 ENCOUNTER — Ambulatory Visit: Payer: Medicare Other | Admitting: Family Medicine

## 2014-07-18 ENCOUNTER — Ambulatory Visit: Payer: Medicare Other | Admitting: Family Medicine

## 2014-07-19 ENCOUNTER — Encounter (HOSPITAL_COMMUNITY): Payer: Self-pay | Admitting: Emergency Medicine

## 2014-07-19 ENCOUNTER — Emergency Department (INDEPENDENT_AMBULATORY_CARE_PROVIDER_SITE_OTHER)
Admission: EM | Admit: 2014-07-19 | Discharge: 2014-07-19 | Disposition: A | Discharge status: Home / Self Care | Payer: Medicare Other | Source: Home / Self Care | Attending: Family Medicine | Admitting: Family Medicine

## 2014-07-19 ENCOUNTER — Ambulatory Visit: Payer: Medicare Other | Admitting: Family Medicine

## 2014-07-19 DIAGNOSIS — M79602 Pain in left arm: Secondary | ICD-10-CM

## 2014-07-19 DIAGNOSIS — M79605 Pain in left leg: Secondary | ICD-10-CM | POA: Diagnosis not present

## 2014-07-19 DIAGNOSIS — E1165 Type 2 diabetes mellitus with hyperglycemia: Secondary | ICD-10-CM

## 2014-07-19 DIAGNOSIS — I10 Essential (primary) hypertension: Secondary | ICD-10-CM

## 2014-07-19 MED ORDER — OXYCODONE-ACETAMINOPHEN 5-325 MG PO TABS: 1.0000 | ORAL_TABLET | Freq: Three times a day (TID) | ORAL | Status: DC | PRN

## 2014-07-19 MED ORDER — KETOROLAC TROMETHAMINE 60 MG/2ML IM SOLN
INTRAMUSCULAR | Status: AC
  Filled 2014-07-19: qty 2

## 2014-07-19 MED ORDER — DICLOFENAC SODIUM 1 % TD GEL: 2.0000 g | Freq: Four times a day (QID) | TRANSDERMAL | Status: DC

## 2014-07-19 MED ORDER — KETOROLAC TROMETHAMINE 60 MG/2ML IM SOLN
60.0000 mg | Freq: Once | INTRAMUSCULAR | Status: AC
  Administered 2014-07-19: 60 mg via INTRAMUSCULAR

## 2014-07-19 NOTE — ED Provider Notes (Addendum)
CSN: [id number]     Arrival date & time 07/19/14  1309 History   None    Chief Complaint  Patient presents with  . Knee Pain    left   (Consider location/radiation/quality/duration/timing/severity/associated sxs/prior Treatment) HPI  L knee pain: started 10 days ago. Seen at [facility] ED on 07/12/14 and given percocet w/o benefit. H/o R knee surgery. No h/o trauma to L knee. xrays at that time showed mild degenerative changes. Called PCP and was told could not be seen for several weeks and told to go to [facility].  Pain on lateral side of knee. Worse w/ ambulation. No change in exercise routine recently. Achy pain. Tylenol and heat compresses w/o benefit. Occasional popping sensation. Denies locking or giving out.   DM: CBG at home around the 180 range.   HTN: did not take medications today. No CP, palpitations, syncope, SOB.     Past Medical History  Diagnosis Date  . Dyslipidemia   . Arthritis   . Hypogonadism male   . HTN (hypertension)   . Diabetes mellitus 2008    type II  . Erectile dysfunction   . History of cardiac catheterization 06/2013    Dr. Peter Swaziland  . Coronary artery disease     nonobstructive per cath 12/14.  normal LV function, falsely abnormal myoview stress test; Dr. Peter Swaziland   Past Surgical History  Procedure Laterality Date  . Incision and drainage perirectal abscess    . Knee surgery      left knee - remote past  . Colonoscopy  08/2006    Dr. Russella Dar - repeat 2018  . Left heart catheterization with coronary angiogram N/A 06/11/2013    Procedure: LEFT HEART CATHETERIZATION WITH CORONARY ANGIOGRAM;  Surgeon: Peter M Swaziland, MD;  Location: [facility] CATH LAB;  Service: Cardiovascular;  Laterality: N/A;   Family History  Problem Relation Age of Onset  . Diabetes Mother   . Other Father     death by MVA  . Other Brother     died of MVA  . Stroke Neg Hx   . Heart disease Neg Hx   . Cancer Neg Hx   . Hypertension Neg Hx    History  Substance Use Topics  . Smoking  status: Former Smoker    Types: Cigarettes    Quit date: 11/11/2001  . Smokeless tobacco: Never Used  . Alcohol Use: No    Review of Systems Per HPI with all other pertinent systems negative.   Allergies  Lisinopril  Home Medications   Prior to Admission medications   Medication Sig Start Date End Date Taking? Authorizing Provider  amLODipine (NORVASC) 5 MG tablet Take 1 tablet (5 mg total) by mouth daily. 01/17/14  Yes Kermit Balo Tysinger, PA-C  aspirin EC 81 MG tablet Take 1 tablet (81 mg total) by mouth daily. 01/17/14  Yes Kermit Balo Tysinger, PA-C  atorvastatin (LIPITOR) 80 MG tablet Take 1 tablet (80 mg total) by mouth daily. 04/04/14  Yes Kermit Balo Tysinger, PA-C  carvedilol (COREG) 25 MG tablet Take 1 tablet (25 mg total) by mouth 2 (two) times daily. 01/17/14  Yes Kermit Balo Tysinger, PA-C  gabapentin (NEURONTIN) 100 MG capsule Take 1 capsule (100 mg total) by mouth 3 (three) times daily. 06/10/14  Yes Kermit Balo Tysinger, PA-C  Insulin Glargine (LANTUS SOLOSTAR) 100 UNIT/ML Solostar Pen 20 units QHS 01/17/14  Yes Kermit Balo Tysinger, PA-C  losartan (COZAAR) 100 MG tablet Take 1 tablet (100 mg total) by mouth  daily. 01/14/14  Yes Kermit Balo Tysinger, PA-C  metFORMIN (GLUCOPHAGE) 1000 MG tablet Take 1 tablet (1,000 mg total) by mouth 2 (two) times daily with a meal. 02/11/14  Yes Kermit Balo Tysinger, PA-C  diclofenac sodium (VOLTAREN) 1 % GEL Apply 2-4 g topically 4 (four) times daily. 07/19/14   Ozella Rocks, MD  minocycline (MINOCIN,DYNACIN) 100 MG capsule Take 1 capsule (100 mg total) by mouth 2 (two) times daily. 06/21/14   Linna Hoff, MD  oxyCODONE-acetaminophen (PERCOCET/ROXICET) 5-325 MG per tablet Take 1-2 tablets by mouth every 8 (eight) hours as needed for moderate pain or severe pain. 07/19/14   Ozella Rocks, MD  tadalafil (CIALIS) 20 MG tablet Take 1 tablet (20 mg total) by mouth daily as needed for erectile dysfunction. 01/14/14   Kermit Balo Tysinger, PA-C  terbinafine (LAMISIL AT) 1 % cream  Apply 1 application topically 2 (two) times daily. 01/14/14   Kermit Balo Tysinger, PA-C   BP 159/96 mmHg  Pulse 83  Temp(Src) 97.2 F (36.2 C) (Oral)  Resp 12  SpO2 95% Physical Exam  Constitutional: He is oriented to person, place, and time. He appears well-developed and well-nourished. No distress.  HENT:  Head: Normocephalic and atraumatic.  Eyes: EOM are normal. Pupils are equal, round, and reactive to light.  Neck: Normal range of motion.  Cardiovascular: Normal rate and normal heart sounds.   No murmur heard. Pulmonary/Chest: Effort normal and breath sounds normal.  Abdominal: Soft. Bowel sounds are normal.  Musculoskeletal:  Knees FROM, no effusion. No pain w/ vualgus or varus stresses, lachman's negative. Neg apprehension test.  No crepitus. Mild point tenderness along the proximal lateral aspect of the leg. No bony ttp.   Neurological: He is alert and oriented to person, place, and time.  Skin: Skin is warm. No rash noted. He is not diaphoretic.  Psychiatric: Judgment and thought content normal.    ED Course  Procedures (including critical care time) Labs Review Labs Reviewed - No data to display  Imaging Review No results found.   MDM   1. Leg pain, lateral, left   2. Essential hypertension   3. Poorly controlled type 2 diabetes mellitus    Leg pain most consistent w/ bursitis. Concern for perineal nerve injury as well as glucose elevation if p were to receive a steroid injection at this time. Pt to work on improving glucose control (currently around 200 daily). Toradol 33m,g IM in office. Start voltaren gel, ice, stretching. Pt to call PCP for f/u appt and possible steroid injection  HTN: elevation improved after rest. Pt needs to simply take his medications and will go home and do so  DM: pt to work on better control as abov.[email or web address]   Precautions given and all questions answered Shelly Flatten, MD Family Medicine 07/19/2014, 2:22 PM      Ozella Rocks,  MD 07/19/14 1415  Ozella Rocks, MD 07/19/14 1422

## 2014-07-19 NOTE — ED Notes (Signed)
Pt woke one morning about two weeks ago with a stiff knee and decreased ROM.  He also has issue with bearing weight on it.  Pt has tried heat, cold and epsom salts with no relief.  Pt was seen a week ago for the same issue at [facility].

## 2014-07-19 NOTE — Discharge Instructions (Signed)
You likely have an inflammed fluid sack of the leg The injection of toradol you were given today will help significantly Your sugar is too high for a steroid injection today. Additionally the injection is dangerous due to the location of the nerve in the leg.  Please try to get your sugar under better control Please use the voltaren gel and ice pack for relief Please call your primary doctor for a follow up appointment in 1-2 weeks.  Please perform daily stretching exercises

## 2014-07-23 ENCOUNTER — Encounter: Payer: Self-pay | Admitting: Family Medicine

## 2014-07-26 ENCOUNTER — Telehealth: Payer: Self-pay | Admitting: Family Medicine

## 2014-07-26 NOTE — Telephone Encounter (Signed)
ED letter sent

## 2014-08-23 ENCOUNTER — Encounter: Payer: Self-pay | Admitting: Medical

## 2014-08-23 ENCOUNTER — Telehealth: Payer: Self-pay | Admitting: Medical

## 2014-08-23 ENCOUNTER — Ambulatory Visit (INDEPENDENT_AMBULATORY_CARE_PROVIDER_SITE_OTHER): Payer: Medicare Other | Admitting: Medical

## 2014-08-23 VITALS — BP 130/90 | HR 82 | Temp 97.9°F | Resp 14 | Wt 270.0 lb

## 2014-08-23 DIAGNOSIS — N528 Other male erectile dysfunction: Secondary | ICD-10-CM | POA: Diagnosis not present

## 2014-08-23 DIAGNOSIS — IMO0001 Reserved for inherently not codable concepts without codable children: Secondary | ICD-10-CM

## 2014-08-23 DIAGNOSIS — L989 Disorder of the skin and subcutaneous tissue, unspecified: Secondary | ICD-10-CM

## 2014-08-23 DIAGNOSIS — E119 Type 2 diabetes mellitus without complications: Secondary | ICD-10-CM | POA: Diagnosis not present

## 2014-08-23 DIAGNOSIS — I10 Essential (primary) hypertension: Secondary | ICD-10-CM | POA: Diagnosis not present

## 2014-08-23 DIAGNOSIS — E1165 Type 2 diabetes mellitus with hyperglycemia: Secondary | ICD-10-CM

## 2014-08-23 DIAGNOSIS — M79602 Pain in left arm: Secondary | ICD-10-CM

## 2014-08-23 DIAGNOSIS — M79605 Pain in left leg: Secondary | ICD-10-CM

## 2014-08-23 DIAGNOSIS — E785 Hyperlipidemia, unspecified: Secondary | ICD-10-CM

## 2014-08-23 LAB — CBC
HEMATOCRIT: 41.3 % (ref 39.0–52.0)
HEMOGLOBIN: 13.8 g/dL (ref 13.0–17.0)
MCH: 33.3 pg (ref 26.0–34.0)
MCHC: 33.4 g/dL (ref 30.0–36.0)
MCV: 99.8 fL (ref 78.0–100.0)
MPV: 9.2 fL (ref 8.6–12.4)
Platelets: 229 10*3/uL (ref 150–400)
RBC: 4.14 MIL/uL — ABNORMAL LOW (ref 4.22–5.81)
RDW: 13.5 % (ref 11.5–15.5)
WBC: 6.2 10*3/uL (ref 4.0–10.5)

## 2014-08-23 LAB — COMPREHENSIVE METABOLIC PANEL
ALK PHOS: 42 U/L (ref 39–117)
ALT: 8 U/L (ref 0–53)
AST: 13 U/L (ref 0–37)
Albumin: 4.1 g/dL (ref 3.5–5.2)
BUN: 9 mg/dL (ref 6–23)
CALCIUM: 9.2 mg/dL (ref 8.4–10.5)
CHLORIDE: 102 meq/L (ref 96–112)
CO2: 29 mEq/L (ref 19–32)
CREATININE: 0.82 mg/dL (ref 0.50–1.35)
Glucose, Bld: 117 mg/dL — ABNORMAL HIGH (ref 70–99)
Potassium: 3.8 mEq/L (ref 3.5–5.3)
Sodium: 137 mEq/L (ref 135–145)
Total Bilirubin: 2 mg/dL — ABNORMAL HIGH (ref 0.2–1.2)
Total Protein: 6.8 g/dL (ref 6.0–8.3)

## 2014-08-23 LAB — HEMOGLOBIN A1C
Hgb A1c MFr Bld: 5.2 % (ref <5.7)
Mean Plasma Glucose: 103 mg/dL (ref <117)

## 2014-08-23 LAB — LIPID PANEL
CHOL/HDL RATIO: 2.8 ratio
Cholesterol: 143 mg/dL (ref 0–200)
HDL: 52 mg/dL (ref >39)
LDL CALC: 76 mg/dL (ref 0–99)
Triglycerides: 76 mg/dL (ref <150)
VLDL: 15 mg/dL (ref 0–40)

## 2014-08-23 NOTE — Progress Notes (Signed)
Subjective: Here for left leg issues and other concerns. Accompanied by his wife today.  He notes having left leg pain that started in January, awoke one day with pain and having to limp. In general he has a known history of bilateral knee arthritis, saw Dr. Despina Hick at [facility] orthopedics last year for the same.  He denies any injury or trauma or fall in January before the pain started. He denies sitting cross legged, denies foot drop, denies weakness numbness or tingling. Of note prior to [place] referring him to orthopedics last year he would continue to come in requesting narcotic medication and after seeing Dr. Despina Hick in 2014, declined steroid knee injections at that time, instead requested narcotic pain medication.    He ended up going to both urgent care and emergency department within the past few weeks for the same complaint as today.  In addition to the ongoing pain in the left leg, he notes that he has been using topical anti-inflammatory as well as a heat pad to the leg. He has had prior surgery of the left knee but this was years ago.  He says that he is compliant with his medications and says that his blood sugars are okay but when asked he notes blood sugars in 160+ range. He request Cialis samples as his birthday is coming up soon. No other aggravating or relieving factors. No other complaint.  Past Medical History  Diagnosis Date  . Dyslipidemia   . Arthritis   . Hypogonadism male   . HTN (hypertension)   . Diabetes mellitus 2008    type II  . Erectile dysfunction   . History of cardiac catheterization 06/2013    Dr. Peter Swaziland  . Coronary artery disease     nonobstructive per cath 12/14.  normal LV function, falsely abnormal myoview stress test; Dr. Peter Swaziland   ROS as in subjective  Objective: BP 130/90 mmHg  Pulse 82  Temp(Src) 97.9 F (36.6 C) (Oral)  Resp 14  Wt 270 lb (122.471 kg)  Gen: wd, wn, nad Skin:left lateral knee region with large patches of brown skin  with some skin peeling suggesting recent burn injury, either from heat pad or chemical contact dermatitis from topical NSAID MSK: tender over left leg laterally just below knee, no obvious laxity, there is some clicking in the knee with flexion, but otherwise no swelling, no deformity, no other specific area of tenderness.  He seems to be in pain mainly with knee flexion.  Right leg nontender today.  He walks with a limp favoring the right, guarded of left knee, hips and ankle nontender, normal ROM Pulses- pedal pulses normal Sensation, strength of legs normal Ext: no edema   Assessment: Encounter Diagnoses  Name Primary?  . Leg pain, inferior, left Yes  . Skin lesion of left leg   . Uncontrolled diabetes mellitus type 2 without complications   . Essential hypertension   . Dyslipidemia   . Other male erectile dysfunction     Plan: Not sure what his ultimate goal was today.  Historically he seems to come in when he is out of Cialis or pain medication. He is not so good about coming in for routine follow-up on diabetes.  He was not actually scheduled for diabetes follow-up giving his numerous concerns, but given lack of recent follow-up I decided to check blood tests today since he is past due regarding chronic issues.   Reviewed recent emergency department notes and x-rays regarding the and leg pain.  Review prior orthopedic notes from last year with Dr. Despina Hick.     After giving him options for therapy - ice, straight leg brace and NSAID short term vs PT referral vs referral back to ortho, he wants to go back to ortho.  We will set his up. Discussed case today with Dr. Susann Givens supervising physician who also examined him  Labs today for diabetes, dyslipidemia, and HTN.    We did not have any Cialis samples today.    F/u pending labs and call back.

## 2014-08-23 NOTE — Telephone Encounter (Signed)
Refer back to Dr. Despina Hick on a Tuesday

## 2014-08-24 ENCOUNTER — Other Ambulatory Visit: Payer: Self-pay | Admitting: Medical

## 2014-08-24 LAB — MICROALBUMIN / CREATININE URINE RATIO
Creatinine, Urine: 218.5 mg/dL
Microalb Creat Ratio: 4.1 mg/g (ref 0.0–30.0)
Microalb, Ur: 0.9 mg/dL (ref <2.0)

## 2014-08-24 MED ORDER — NAPROXEN 500 MG PO TABS: 500.0000 mg | ORAL_TABLET | Freq: Two times a day (BID) | ORAL | Status: DC

## 2014-08-24 NOTE — Telephone Encounter (Signed)
WORKING ON THIS

## 2014-08-30 ENCOUNTER — Telehealth: Payer: Self-pay | Admitting: Family Medicine

## 2014-08-30 NOTE — Telephone Encounter (Signed)
Per Kristian Covey, PA orders I am suppose to refer the patient to see Dr. Despina Hick at [facility]. I called over to [facility]. And I can set the appointment because the patient owe's there office money. I look through the patients chart and there is not a number listed or a emergency number listed either, so I'm unable to contact this patient at all. Kristian Covey PA is aware of this

## 2014-09-20 DIAGNOSIS — M25562 Pain in left knee: Secondary | ICD-10-CM | POA: Diagnosis not present

## 2014-09-29 DIAGNOSIS — M25562 Pain in left knee: Secondary | ICD-10-CM | POA: Diagnosis not present

## 2014-10-13 DIAGNOSIS — M1712 Unilateral primary osteoarthritis, left knee: Secondary | ICD-10-CM | POA: Diagnosis not present

## 2014-11-22 DIAGNOSIS — M1712 Unilateral primary osteoarthritis, left knee: Secondary | ICD-10-CM | POA: Diagnosis not present

## 2014-11-22 DIAGNOSIS — M25562 Pain in left knee: Secondary | ICD-10-CM | POA: Diagnosis not present

## 2014-11-22 DIAGNOSIS — M1711 Unilateral primary osteoarthritis, right knee: Secondary | ICD-10-CM | POA: Diagnosis not present

## 2014-11-22 DIAGNOSIS — S83231D Complex tear of medial meniscus, current injury, right knee, subsequent encounter: Secondary | ICD-10-CM | POA: Diagnosis not present

## 2014-12-09 ENCOUNTER — Ambulatory Visit: Payer: Self-pay | Admitting: Orthopedic Surgery

## 2014-12-09 NOTE — Progress Notes (Signed)
Preoperative surgical orders have been place into the Epic hospital system for Jerry [name] on 12/09/2014, 11:38 AM  by Patrica Duel for surgery on 12-28-2014.  Preop Knee Scope orders including IV Tylenol and IV Decadron as long as there are no contraindications to the above medications. [name], PA-C

## 2014-12-21 ENCOUNTER — Telehealth: Payer: Self-pay | Admitting: Internal Medicine

## 2014-12-21 NOTE — Telephone Encounter (Signed)
Faxed over medical records to [facility] at [place] @ [phone] today 12/21/14

## 2014-12-23 NOTE — Patient Instructions (Addendum)
Jerry [name]  12/23/2014   Your procedure is scheduled on:     12/28/2014    Report to [facility] Main  Entrance and follow signs to               Short Stay Center at       3:00pm  Call this number if you have problems the morning of surgery [phone]   Remember: ONLY 1 PERSON MAY GO WITH YOU TO SHORT STAY TO GET  READY MORNING OF YOUR SURGERY.  Do not eat food after midnite.  May have clear liquids until 1000 am morning of surgery then northing by mouth.                  Take 1/2 of evening dose of Insulin nite before surgery.    Take these medicines the morning of surgery with A SIP OF WATER:  Amlodipine ( Norvasc), Carvedilol ( coreg), Neurontin                               You may not have any metal on your body including hair pins and              piercings  Do not wear jewelry, , lotions, powders or perfumes, deodorant                          Men may shave face and neck.   Do not bring valuables to the hospital. [facility] IS NOT             RESPONSIBLE   FOR VALUABLES.  Contacts, dentures or bridgework may not be worn into surgery.       Patients discharged the day of surgery will not be allowed to drive home.  Name and phone number of your driver:  Special Instructions: coughing and deep breathing exercises, leg exercises               Please read over the following fact sheets you were given: _____________________________________________________________________             [facility] - Preparing for Surgery Before surgery, you can play an important role.  Because skin is not sterile, your skin needs to be as free of germs as possible.  You can reduce the number of germs on your skin by washing with CHG (chlorahexidine gluconate) soap before surgery.  CHG is an antiseptic cleaner which kills germs and bonds with the skin to continue killing germs even after washing. Please DO NOT use if you have an allergy to CHG or antibacterial soaps.   If your skin becomes reddened/irritated stop using the CHG and inform your nurse when you arrive at Short Stay. Do not shave (including legs and underarms) for at least 48 hours prior to the first CHG shower.  You may shave your face/neck. Please follow these instructions carefully:  1.  Shower with CHG Soap the night before surgery and the  morning of Surgery.  2.  If you choose to wash your hair, wash your hair first as usual with your  normal  shampoo.  3.  After you shampoo, rinse your hair and body thoroughly to remove the  shampoo.  4.  Use CHG as you would any other liquid soap.  You can apply chg directly  to the skin and wash                       Gently with a scrungie or clean washcloth.  5.  Apply the CHG Soap to your body ONLY FROM THE NECK DOWN.   Do not use on face/ open                           Wound or open sores. Avoid contact with eyes, ears mouth and genitals (private parts).                       Wash face,  Genitals (private parts) with your normal soap.             6.  Wash thoroughly, paying special attention to the area where your surgery  will be performed.  7.  Thoroughly rinse your body with warm water from the neck down.  8.  DO NOT shower/wash with your normal soap after using and rinsing off  the CHG Soap.                9.  Pat yourself dry with a clean towel.            10.  Wear clean pajamas.            11.  Place clean sheets on your bed the night of your first shower and do not  sleep with pets. Day of Surgery : Do not apply any lotions/deodorants the morning of surgery.  Please wear clean clothes to the hospital/surgery center.  FAILURE TO FOLLOW THESE INSTRUCTIONS MAY RESULT IN THE CANCELLATION OF YOUR SURGERY PATIENT SIGNATURE_________________________________  NURSE SIGNATURE__________________________________  ________________________________________________________________________   Jerry [name]  An incentive  spirometer is a tool that can help keep your lungs clear and active. This tool measures how well you are filling your lungs with each breath. Taking long deep breaths may help reverse or decrease the chance of developing breathing (pulmonary) problems (especially infection) following:  A long period of time when you are unable to move or be active. BEFORE THE PROCEDURE   If the spirometer includes an indicator to show your best effort, your nurse or respiratory therapist will set it to a desired goal.  If possible, sit up straight or lean slightly forward. Try not to slouch.  Hold the incentive spirometer in an upright position. INSTRUCTIONS FOR USE  1. Sit on the edge of your bed if possible, or sit up as far as you can in bed or on a chair. 2. Hold the incentive spirometer in an upright position. 3. Breathe out normally. 4. Place the mouthpiece in your mouth and seal your lips tightly around it. 5. Breathe in slowly and as deeply as possible, raising the piston or the ball toward the top of the column. 6. Hold your breath for 3-5 seconds or for as long as possible. Allow the piston or ball to fall to the bottom of the column. 7. Remove the mouthpiece from your mouth and breathe out normally. 8. Rest for a few seconds and repeat Steps 1 through 7 at least 10 times every 1-2 hours when you are awake. Take your time and take a few normal breaths between deep breaths. 9. The spirometer may include an indicator to show  your best effort. Use the indicator as a goal to work toward during each repetition. 10. After each set of 10 deep breaths, practice coughing to be sure your lungs are clear. If you have an incision (the cut made at the time of surgery), support your incision when coughing by placing a pillow or rolled up towels firmly against it. Once you are able to get out of bed, walk around indoors and cough well. You may stop using the incentive spirometer when instructed by your caregiver.   RISKS AND COMPLICATIONS  Take your time so you do not get dizzy or light-headed.  If you are in pain, you may need to take or ask for pain medication before doing incentive spirometry. It is harder to take a deep breath if you are having pain. AFTER USE  Rest and breathe slowly and easily.  It can be helpful to keep track of a log of your progress. Your caregiver can provide you with a simple table to help with this. If you are using the spirometer at home, follow these instructions: SEEK MEDICAL CARE IF:   You are having difficultly using the spirometer.  You have trouble using the spirometer as often as instructed.  Your pain medication is not giving enough relief while using the spirometer.  You develop fever of 100.5 F (38.1 C) or higher. SEEK IMMEDIATE MEDICAL CARE IF:   You cough up bloody sputum that had not been present before.  You develop fever of 102 F (38.9 C) or greater.  You develop worsening pain at or near the incision site. MAKE SURE YOU:   Understand these instructions.  Will watch your condition.  Will get help right away if you are not doing well or get worse. Document Released: 11/04/2006 Document Revised: 09/16/2011 Document Reviewed: 01/05/2007 ExitCare Patient Information 2014 ExitCare, [place].   ________________________________________________________________________    CLEAR LIQUID DIET   Foods Allowed                                                                     Foods Excluded  Coffee and tea, regular and decaf                             liquids that you cannot  Plain Jell-O in any flavor                                             see through such as: Fruit ices (not with fruit pulp)                                     milk, soups, orange juice  Iced Popsicles                                    All solid food Carbonated beverages, regular and diet  Cranberry, grape and apple juices Sports  drinks like Gatorade Lightly seasoned clear broth or consume(fat free) Sugar, honey syrup  Sample Menu Breakfast                                Lunch                                     Supper Cranberry juice                    Beef broth                            Chicken broth Jell-O                                     Grape juice                           Apple juice Coffee or tea                        Jell-O                                      Popsicle                                                Coffee or tea                        Coffee or tea  _____________________________________________________________________

## 2014-12-26 ENCOUNTER — Encounter (HOSPITAL_COMMUNITY)
Admission: RE | Admit: 2014-12-26 | Discharge: 2014-12-26 | Disposition: A | Discharge status: Home / Self Care | Payer: Medicare Other | Source: Ambulatory Visit | Attending: Orthopedic Surgery | Admitting: Orthopedic Surgery

## 2014-12-26 ENCOUNTER — Encounter (HOSPITAL_COMMUNITY): Payer: Self-pay

## 2014-12-26 DIAGNOSIS — E119 Type 2 diabetes mellitus without complications: Secondary | ICD-10-CM | POA: Diagnosis not present

## 2014-12-26 DIAGNOSIS — X58XXXA Exposure to other specified factors, initial encounter: Secondary | ICD-10-CM | POA: Diagnosis not present

## 2014-12-26 DIAGNOSIS — Z87891 Personal history of nicotine dependence: Secondary | ICD-10-CM | POA: Diagnosis not present

## 2014-12-26 DIAGNOSIS — M1990 Unspecified osteoarthritis, unspecified site: Secondary | ICD-10-CM | POA: Diagnosis not present

## 2014-12-26 DIAGNOSIS — S83242A Other tear of medial meniscus, current injury, left knee, initial encounter: Secondary | ICD-10-CM | POA: Diagnosis not present

## 2014-12-26 DIAGNOSIS — E785 Hyperlipidemia, unspecified: Secondary | ICD-10-CM | POA: Diagnosis not present

## 2014-12-26 DIAGNOSIS — N529 Male erectile dysfunction, unspecified: Secondary | ICD-10-CM | POA: Diagnosis not present

## 2014-12-26 DIAGNOSIS — Z794 Long term (current) use of insulin: Secondary | ICD-10-CM | POA: Diagnosis not present

## 2014-12-26 DIAGNOSIS — I10 Essential (primary) hypertension: Secondary | ICD-10-CM | POA: Diagnosis not present

## 2014-12-26 DIAGNOSIS — Z791 Long term (current) use of non-steroidal anti-inflammatories (NSAID): Secondary | ICD-10-CM | POA: Diagnosis not present

## 2014-12-26 DIAGNOSIS — Z7982 Long term (current) use of aspirin: Secondary | ICD-10-CM | POA: Diagnosis not present

## 2014-12-26 DIAGNOSIS — Z79899 Other long term (current) drug therapy: Secondary | ICD-10-CM | POA: Diagnosis not present

## 2014-12-26 DIAGNOSIS — I2510 Atherosclerotic heart disease of native coronary artery without angina pectoris: Secondary | ICD-10-CM | POA: Diagnosis not present

## 2014-12-26 DIAGNOSIS — M25562 Pain in left knee: Secondary | ICD-10-CM | POA: Diagnosis present

## 2014-12-26 LAB — BASIC METABOLIC PANEL
Anion gap: 5 (ref 5–15)
BUN: 15 mg/dL (ref 6–20)
CHLORIDE: 105 mmol/L (ref 101–111)
CO2: 27 mmol/L (ref 22–32)
Calcium: 9 mg/dL (ref 8.9–10.3)
Creatinine, Ser: 0.87 mg/dL (ref 0.61–1.24)
GFR calc Af Amer: >60 mL/min (ref >60)
GLUCOSE: 128 mg/dL — AB (ref 65–99)
POTASSIUM: 4.2 mmol/L (ref 3.5–5.1)
SODIUM: 137 mmol/L (ref 135–145)

## 2014-12-26 LAB — CBC
HCT: 37.6 % — ABNORMAL LOW (ref 39.0–52.0)
Hemoglobin: 13 g/dL (ref 13.0–17.0)
MCH: 33.6 pg (ref 26.0–34.0)
MCHC: 34.6 g/dL (ref 30.0–36.0)
MCV: 97.2 fL (ref 78.0–100.0)
PLATELETS: 208 10*3/uL (ref 150–400)
RBC: 3.87 MIL/uL — AB (ref 4.22–5.81)
RDW: 12.4 % (ref 11.5–15.5)
WBC: 5.5 10*3/uL (ref 4.0–10.5)

## 2014-12-26 NOTE — Progress Notes (Signed)
   12/26/14 1428  OBSTRUCTIVE SLEEP APNEA  Have you ever been diagnosed with sleep apnea through a sleep study? No  Do you snore loudly (loud enough to be heard through closed doors)?  1  Do you often feel tired, fatigued, or sleepy during the daytime? 1  Has anyone observed you stop breathing during your sleep? 0  Do you have, or are you being treated for high blood pressure? 1  Obstructive Sleep Apnea Score 5

## 2014-12-26 NOTE — Progress Notes (Addendum)
08-23-14- LOV- Crosby Oyster, PA-C (pcp)- EPIC 09-09-13 - LOV - Dr. P. Swaziland (cardio) - EPIC 06-17-13 - EKG - EPIC 06-08-13 - Stress Test - EPIC

## 2014-12-27 ENCOUNTER — Telehealth: Payer: Self-pay | Admitting: Medical

## 2014-12-27 DIAGNOSIS — S83249A Other tear of medial meniscus, current injury, unspecified knee, initial encounter: Secondary | ICD-10-CM | POA: Diagnosis present

## 2014-12-27 NOTE — H&P (Signed)
CC- Jerry [name] is a 59 y.o. male who presents with left knee pain.  HPI- . Knee Pain: Patient presents with knee pain involving the  left knee. Onset of the symptoms was several months ago. Inciting event: none known. Current symptoms include giving out, pain located medially and stiffness. Pain is aggravated by going up and down stairs, kneeling, lateral movements, pivoting and squatting.  Patient has had no prior knee problems. Evaluation to date: MRI: abnormal medial meniscal tear. Treatment to date: corticosteroid injection which was not very effective.  Past Medical History  Diagnosis Date  . Dyslipidemia   . Arthritis   . Hypogonadism male   . HTN (hypertension)   . Diabetes mellitus 2008    type II  . Erectile dysfunction   . History of cardiac catheterization 06/2013    Dr. Peter Swaziland  . Coronary artery disease     nonobstructive per cath 12/14.  normal LV function, falsely abnormal myoview stress test; Dr. Peter Swaziland    Past Surgical History  Procedure Laterality Date  . Incision and drainage perirectal abscess    . Knee surgery      left knee - remote past  . Colonoscopy  08/2006    Dr. Russella Dar - repeat 2018  . Left heart catheterization with coronary angiogram N/A 06/11/2013    Procedure: LEFT HEART CATHETERIZATION WITH CORONARY ANGIOGRAM;  Surgeon: Peter M Swaziland, MD;  Location: [facility] CATH LAB;  Service: Cardiovascular;  Laterality: N/A;  . Appendectomy    . Tonsillectomy    . Wisdom teeth extractions      Prior to Admission medications   Medication Sig Start Date End Date Taking? Authorizing Provider  acetaminophen (TYLENOL) 500 MG tablet Take 1,000 mg by mouth every 6 (six) hours as needed for mild pain.   Yes Historical Provider, MD  amLODipine (NORVASC) 5 MG tablet Take 1 tablet (5 mg total) by mouth daily. 01/17/14  Yes Kermit Balo Tysinger, PA-C  aspirin EC 81 MG tablet Take 1 tablet (81 mg total) by mouth daily. 01/17/14  Yes Kermit Balo Tysinger, PA-C  carvedilol  (COREG) 25 MG tablet Take 1 tablet (25 mg total) by mouth 2 (two) times daily. 01/17/14  Yes Kermit Balo Tysinger, PA-C  gabapentin (NEURONTIN) 100 MG capsule Take 1 capsule (100 mg total) by mouth 3 (three) times daily. 06/10/14  Yes Kermit Balo Tysinger, PA-C  Insulin Glargine (LANTUS SOLOSTAR) 100 UNIT/ML Solostar Pen 20 units QHS Patient taking differently: Inject 15 Units into the skin 2 (two) times daily.  01/17/14  Yes Kermit Balo Tysinger, PA-C  losartan (COZAAR) 100 MG tablet Take 1 tablet (100 mg total) by mouth daily. 01/14/14  Yes Kermit Balo Tysinger, PA-C  metFORMIN (GLUCOPHAGE) 1000 MG tablet Take 1 tablet (1,000 mg total) by mouth 2 (two) times daily with a meal. Patient taking differently: Take 1,000 mg by mouth daily.  02/11/14  Yes Kermit Balo Tysinger, PA-C  naproxen (NAPROSYN) 500 MG tablet Take 1 tablet (500 mg total) by mouth 2 (two) times daily with a meal. Patient taking differently: Take 500 mg by mouth 2 (two) times daily as needed for moderate pain.  08/24/14  Yes Kermit Balo Tysinger, PA-C  tadalafil (CIALIS) 20 MG tablet Take 1 tablet (20 mg total) by mouth daily as needed for erectile dysfunction. 01/14/14  Yes Kermit Balo Tysinger, PA-C  terbinafine (LAMISIL AT) 1 % cream Apply 1 application topically 2 (two) times daily. 01/14/14  Yes Kermit Balo Tysinger, PA-C  diclofenac  sodium (VOLTAREN) 1 % GEL Apply 2-4 g topically 4 (four) times daily. Patient not taking: Reported on 12/21/2014 07/19/14   Ozella Rocks, MD  minocycline (MINOCIN,DYNACIN) 100 MG capsule Take 1 capsule (100 mg total) by mouth 2 (two) times daily. Patient not taking: Reported on 12/21/2014 06/21/14   Linna Hoff, MD  oxyCODONE-acetaminophen (PERCOCET/ROXICET) 5-325 MG per tablet Take 1-2 tablets by mouth every 8 (eight) hours as needed for moderate pain or severe pain. Patient not taking: Reported on 12/21/2014 07/19/14   Ozella Rocks, MD   KNEE EXAM antalgic gait, soft tissue tenderness over medial joint line, no effusion, negative  drawer sign, collateral ligaments intact  Physical Examination: General appearance - alert, well appearing, and in no distress Mental status - alert, oriented to person, place, and time Chest - clear to auscultation, no wheezes, rales or rhonchi, symmetric air entry Heart - normal rate, regular rhythm, normal S1, S2, no murmurs, rubs, clicks or gallops Abdomen - soft, nontender, nondistended, no masses or organomegaly Neurological - alert, oriented, normal speech, no focal findings or movement disorder noted   Asessment/Plan--- Left knee medial meniscal tear- - Plan left knee arthroscopy with meniscal debridement. Procedure risks and potential comps discussed with patient who elects to proceed. Goals are decreased pain and increased function with a high likelihood of achieving both

## 2014-12-27 NOTE — Telephone Encounter (Signed)
I left a message on the patients voicemail about setting up a follow up appointment.

## 2014-12-27 NOTE — Telephone Encounter (Signed)
Lets make a diabetes f/u appt.  At that appt we will need to discuss sleep apnea.   He had recent screen that showed possible sleep apnea.

## 2014-12-28 ENCOUNTER — Ambulatory Visit (HOSPITAL_COMMUNITY): Payer: Medicare Other | Admitting: Anesthesiology

## 2014-12-28 ENCOUNTER — Ambulatory Visit (HOSPITAL_COMMUNITY)
Admission: RE | Admit: 2014-12-28 | Discharge: 2014-12-28 | Disposition: A | Discharge status: Home / Self Care | Payer: Medicare Other | Source: Ambulatory Visit | Attending: Orthopedic Surgery | Admitting: Orthopedic Surgery

## 2014-12-28 ENCOUNTER — Encounter (HOSPITAL_COMMUNITY): Payer: Self-pay | Admitting: *Deleted

## 2014-12-28 ENCOUNTER — Encounter (HOSPITAL_COMMUNITY)
Admission: RE | Disposition: A | Discharge status: Home / Self Care | Payer: Self-pay | Source: Ambulatory Visit | Attending: Orthopedic Surgery

## 2014-12-28 DIAGNOSIS — Z791 Long term (current) use of non-steroidal anti-inflammatories (NSAID): Secondary | ICD-10-CM | POA: Insufficient documentation

## 2014-12-28 DIAGNOSIS — M1990 Unspecified osteoarthritis, unspecified site: Secondary | ICD-10-CM | POA: Insufficient documentation

## 2014-12-28 DIAGNOSIS — I10 Essential (primary) hypertension: Secondary | ICD-10-CM | POA: Diagnosis not present

## 2014-12-28 DIAGNOSIS — E785 Hyperlipidemia, unspecified: Secondary | ICD-10-CM | POA: Diagnosis not present

## 2014-12-28 DIAGNOSIS — S83249A Other tear of medial meniscus, current injury, unspecified knee, initial encounter: Secondary | ICD-10-CM | POA: Diagnosis present

## 2014-12-28 DIAGNOSIS — Z7982 Long term (current) use of aspirin: Secondary | ICD-10-CM | POA: Insufficient documentation

## 2014-12-28 DIAGNOSIS — N529 Male erectile dysfunction, unspecified: Secondary | ICD-10-CM | POA: Insufficient documentation

## 2014-12-28 DIAGNOSIS — I2510 Atherosclerotic heart disease of native coronary artery without angina pectoris: Secondary | ICD-10-CM | POA: Diagnosis not present

## 2014-12-28 DIAGNOSIS — Z794 Long term (current) use of insulin: Secondary | ICD-10-CM | POA: Insufficient documentation

## 2014-12-28 DIAGNOSIS — E119 Type 2 diabetes mellitus without complications: Secondary | ICD-10-CM | POA: Diagnosis not present

## 2014-12-28 DIAGNOSIS — S83242A Other tear of medial meniscus, current injury, left knee, initial encounter: Secondary | ICD-10-CM | POA: Insufficient documentation

## 2014-12-28 DIAGNOSIS — X58XXXA Exposure to other specified factors, initial encounter: Secondary | ICD-10-CM | POA: Insufficient documentation

## 2014-12-28 DIAGNOSIS — Z87891 Personal history of nicotine dependence: Secondary | ICD-10-CM | POA: Insufficient documentation

## 2014-12-28 DIAGNOSIS — Z79899 Other long term (current) drug therapy: Secondary | ICD-10-CM | POA: Insufficient documentation

## 2014-12-28 DIAGNOSIS — M23322 Other meniscus derangements, posterior horn of medial meniscus, left knee: Secondary | ICD-10-CM | POA: Diagnosis not present

## 2014-12-28 HISTORY — PX: KNEE ARTHROSCOPY: SHX127

## 2014-12-28 LAB — GLUCOSE, CAPILLARY
GLUCOSE-CAPILLARY: 94 mg/dL (ref 65–99)
Glucose-Capillary: 111 mg/dL — ABNORMAL HIGH (ref 65–99)

## 2014-12-28 SURGERY — ARTHROSCOPY, KNEE
Anesthesia: General | Site: Knee | Laterality: Left

## 2014-12-28 MED ORDER — LIDOCAINE HCL (CARDIAC) 20 MG/ML IV SOLN
INTRAVENOUS | Status: DC | PRN
  Administered 2014-12-28: 40 mg via INTRAVENOUS

## 2014-12-28 MED ORDER — ACETAMINOPHEN 500 MG PO TABS
1000.0000 mg | ORAL_TABLET | Freq: Once | ORAL | Status: AC
  Administered 2014-12-28: 1000 mg via ORAL
  Filled 2014-12-28: qty 2

## 2014-12-28 MED ORDER — BUPIVACAINE-EPINEPHRINE 0.25% -1:200000 IJ SOLN
INTRAMUSCULAR | Status: DC | PRN
  Administered 2014-12-28: 20 mL

## 2014-12-28 MED ORDER — PROPOFOL 10 MG/ML IV BOLUS
INTRAVENOUS | Status: DC | PRN
  Administered 2014-12-28: 50 mg via INTRAVENOUS
  Administered 2014-12-28: 250 mg via INTRAVENOUS

## 2014-12-28 MED ORDER — OXYCODONE HCL ER 10 MG PO T12A
EXTENDED_RELEASE_TABLET | ORAL | Status: AC
  Filled 2014-12-28: qty 1

## 2014-12-28 MED ORDER — DEXAMETHASONE SODIUM PHOSPHATE 10 MG/ML IJ SOLN
10.0000 mg | Freq: Once | INTRAMUSCULAR | Status: AC
  Administered 2014-12-28: 10 mg via INTRAVENOUS

## 2014-12-28 MED ORDER — OXYCODONE HCL 5 MG PO TABS: 5.0000 mg | ORAL_TABLET | ORAL | Status: DC | PRN

## 2014-12-28 MED ORDER — DEXTROSE 5 % IV SOLN
3.0000 g | INTRAVENOUS | Status: AC
  Administered 2014-12-28: 3 g via INTRAVENOUS
  Filled 2014-12-28: qty 3000

## 2014-12-28 MED ORDER — CHLORHEXIDINE GLUCONATE 4 % EX LIQD: 60.0000 mL | Freq: Once | CUTANEOUS | Status: DC

## 2014-12-28 MED ORDER — LACTATED RINGERS IR SOLN
Status: DC | PRN
  Administered 2014-12-28: 3000 mL

## 2014-12-28 MED ORDER — MIDAZOLAM HCL 2 MG/2ML IJ SOLN
INTRAMUSCULAR | Status: AC
  Filled 2014-12-28: qty 2

## 2014-12-28 MED ORDER — SODIUM CHLORIDE 0.9 % IV SOLN: INTRAVENOUS | Status: DC

## 2014-12-28 MED ORDER — OXYCODONE HCL 5 MG PO TABS
10.0000 mg | ORAL_TABLET | Freq: Once | ORAL | Status: AC
  Administered 2014-12-28: 10 mg via ORAL

## 2014-12-28 MED ORDER — PROMETHAZINE HCL 25 MG/ML IJ SOLN: 6.2500 mg | INTRAMUSCULAR | Status: DC | PRN

## 2014-12-28 MED ORDER — ONDANSETRON HCL 4 MG/2ML IJ SOLN
INTRAMUSCULAR | Status: DC | PRN
  Administered 2014-12-28: 4 mg via INTRAVENOUS

## 2014-12-28 MED ORDER — MIDAZOLAM HCL 5 MG/5ML IJ SOLN
INTRAMUSCULAR | Status: DC | PRN
  Administered 2014-12-28: 2 mg via INTRAVENOUS

## 2014-12-28 MED ORDER — HYDROMORPHONE HCL 1 MG/ML IJ SOLN
0.2500 mg | INTRAMUSCULAR | Status: DC | PRN
  Administered 2014-12-28 (×2): 0.25 mg via INTRAVENOUS
  Administered 2014-12-28 (×3): 0.5 mg via INTRAVENOUS

## 2014-12-28 MED ORDER — HYDROMORPHONE HCL 1 MG/ML IJ SOLN
INTRAMUSCULAR | Status: AC
  Filled 2014-12-28: qty 1

## 2014-12-28 MED ORDER — PROPOFOL 10 MG/ML IV BOLUS
INTRAVENOUS | Status: AC
  Filled 2014-12-28: qty 20

## 2014-12-28 MED ORDER — EPHEDRINE SULFATE 50 MG/ML IJ SOLN
INTRAMUSCULAR | Status: DC | PRN
  Administered 2014-12-28: 10 mg via INTRAVENOUS

## 2014-12-28 MED ORDER — BUPIVACAINE-EPINEPHRINE (PF) 0.25% -1:200000 IJ SOLN
INTRAMUSCULAR | Status: AC
  Filled 2014-12-28: qty 30

## 2014-12-28 MED ORDER — INSULIN GLARGINE 100 UNIT/ML SOLOSTAR PEN: 15.0000 [IU] | PEN_INJECTOR | Freq: Two times a day (BID) | SUBCUTANEOUS | Status: AC

## 2014-12-28 MED ORDER — METHOCARBAMOL 500 MG PO TABS: 500.0000 mg | ORAL_TABLET | Freq: Four times a day (QID) | ORAL | Status: DC

## 2014-12-28 MED ORDER — METFORMIN HCL 1000 MG PO TABS: 1000.0000 mg | ORAL_TABLET | Freq: Every day | ORAL | Status: AC

## 2014-12-28 MED ORDER — FENTANYL CITRATE (PF) 100 MCG/2ML IJ SOLN
INTRAMUSCULAR | Status: AC
  Filled 2014-12-28: qty 2

## 2014-12-28 MED ORDER — ACETAMINOPHEN 10 MG/ML IV SOLN
INTRAVENOUS | Status: AC
  Filled 2014-12-28: qty 100

## 2014-12-28 MED ORDER — HYDROMORPHONE HCL 1 MG/ML IJ SOLN
INTRAMUSCULAR | Status: DC
Start: 2014-12-28 — End: 2014-12-29
  Filled 2014-12-28: qty 1

## 2014-12-28 MED ORDER — CARVEDILOL 25 MG PO TABS
25.0000 mg | ORAL_TABLET | Freq: Two times a day (BID) | ORAL | Status: AC
  Administered 2014-12-28: 25 mg via ORAL
  Filled 2014-12-28: qty 1

## 2014-12-28 MED ORDER — DEXAMETHASONE SODIUM PHOSPHATE 10 MG/ML IJ SOLN
INTRAMUSCULAR | Status: AC
  Filled 2014-12-28: qty 1

## 2014-12-28 MED ORDER — LACTATED RINGERS IV SOLN
INTRAVENOUS | Status: DC
  Administered 2014-12-28: 1000 mL via INTRAVENOUS

## 2014-12-28 MED ORDER — OXYCODONE HCL 5 MG PO TABS
ORAL_TABLET | ORAL | Status: AC
  Filled 2014-12-28: qty 2

## 2014-12-28 MED ORDER — FENTANYL CITRATE (PF) 100 MCG/2ML IJ SOLN
INTRAMUSCULAR | Status: DC | PRN
  Administered 2014-12-28 (×2): 50 ug via INTRAVENOUS

## 2014-12-28 MED ORDER — ACETAMINOPHEN 10 MG/ML IV SOLN: 1000.0000 mg | Freq: Once | INTRAVENOUS | Status: DC

## 2014-12-28 SURGICAL SUPPLY — 27 items
BANDAGE ELASTIC 6 VELCRO ST LF (GAUZE/BANDAGES/DRESSINGS) ×3 IMPLANT
BLADE 4.2CUDA (BLADE) ×3 IMPLANT
COVER SURGICAL LIGHT HANDLE (MISCELLANEOUS) ×3 IMPLANT
CUFF TOURN SGL QUICK 34 (TOURNIQUET CUFF) ×2
CUFF TRNQT CYL 34X4X40X1 (TOURNIQUET CUFF) ×1 IMPLANT
DRAPE U-SHAPE 47X51 STRL (DRAPES) ×3 IMPLANT
DRSG EMULSION OIL 3X3 NADH (GAUZE/BANDAGES/DRESSINGS) ×3 IMPLANT
DRSG PAD ABDOMINAL 8X10 ST (GAUZE/BANDAGES/DRESSINGS) IMPLANT
DURAPREP 26ML APPLICATOR (WOUND CARE) ×3 IMPLANT
GAUZE SPONGE 4X4 12PLY STRL (GAUZE/BANDAGES/DRESSINGS) IMPLANT
GLOVE BIO SURGEON STRL SZ8 (GLOVE) ×3 IMPLANT
GLOVE BIOGEL PI IND STRL 8 (GLOVE) ×1 IMPLANT
GLOVE BIOGEL PI INDICATOR 8 (GLOVE) ×2
GOWN STRL REUS W/TWL LRG LVL3 (GOWN DISPOSABLE) ×3 IMPLANT
KIT BASIN OR (CUSTOM PROCEDURE TRAY) ×3 IMPLANT
MANIFOLD NEPTUNE II (INSTRUMENTS) ×3 IMPLANT
PACK ARTHROSCOPY WL (CUSTOM PROCEDURE TRAY) ×3 IMPLANT
PACK ICE MAXI GEL EZY WRAP (MISCELLANEOUS) ×9 IMPLANT
PADDING CAST COTTON 6X4 STRL (CAST SUPPLIES) ×3 IMPLANT
PEN SKIN MARKING BROAD (MISCELLANEOUS) ×3 IMPLANT
POSITIONER SURGICAL ARM (MISCELLANEOUS) ×3 IMPLANT
SET ARTHROSCOPY TUBING (MISCELLANEOUS) ×2
SET ARTHROSCOPY TUBING LN (MISCELLANEOUS) ×1 IMPLANT
SUT ETHILON 4 0 PS 2 18 (SUTURE) ×3 IMPLANT
TOWEL OR 17X26 10 PK STRL BLUE (TOWEL DISPOSABLE) ×3 IMPLANT
WAND 90 DEG TURBOVAC W/CORD (SURGICAL WAND) ×3 IMPLANT
WRAP KNEE MAXI GEL POST OP (GAUZE/BANDAGES/DRESSINGS) IMPLANT

## 2014-12-28 NOTE — Anesthesia Preprocedure Evaluation (Addendum)
Anesthesia Evaluation  Patient identified by MRN, date of birth, ID band Patient awake    Reviewed: Allergy & Precautions, NPO status , Patient's Chart, lab work & pertinent test results  Airway Mallampati: II  TM Distance: >3 FB Neck ROM: Full    Dental   Pulmonary shortness of breath, former smoker,  breath sounds clear to auscultation        Cardiovascular hypertension, + CAD Rhythm:Regular Rate:Normal     Neuro/Psych    GI/Hepatic negative GI ROS, Neg liver ROS,   Endo/Other  diabetes  Renal/GU negative Renal ROS     Musculoskeletal   Abdominal   Peds  Hematology   Anesthesia Other Findings   Reproductive/Obstetrics                            Anesthesia Physical Anesthesia Plan  ASA: III  Anesthesia Plan: General   Post-op Pain Management:    Induction: Intravenous  Airway Management Planned: LMA  Additional Equipment:   Intra-op Plan:   Post-operative Plan: Extubation in OR  Informed Consent: I have reviewed the patients History and Physical, chart, labs and discussed the procedure including the risks, benefits and alternatives for the proposed anesthesia with the patient or authorized representative who has indicated his/her understanding and acceptance.   Dental advisory given  Plan Discussed with: CRNA and Anesthesiologist  Anesthesia Plan Comments:         Anesthesia Quick Evaluation

## 2014-12-28 NOTE — Op Note (Signed)
Preoperative diagnosis-  Left knee medial meniscal tear  Postoperative diagnosis Left- knee medial meniscal tear   Procedure- Left knee arthroscopy with medial  meniscal debridement    Surgeon- Gus Rankin. Shaundrea Carrigg, MD  Anesthesia-General  EBL-  Minimal  Complications- None  Condition- PACU - hemodynamically stable.  Brief clinical note- -Jerry [name] is a 59 y.o.  male with a several month history of left knee pain and mechanical symptoms. Exam and history suggested medial meniscal tear confirmed by MRI. The patient presents now for arthroscopy and debridement  Procedure in detail -       After successful administration of General anesthetic, a tourmiquet is placed high on the Left  thigh and the Left lower extremity is prepped and draped in the usual sterile fashion. Time out is performed by the surgical team. Standard superomedial and inferolateral portal sites are marked and incisions made with an 11 blade. The inflow cannula is passed through the superomedial portal and camera through the inferolateral portal and inflow is initiated. Arthroscopic visualization proceeds.      The undersurface of the patella and trochlea are visualized and there is a Grade III chondral defect central trochlea and scattered grade II and III changes both surfaces. The medial and lateral gutters are visualized and there are  no loose bodies. Flexion and valgus force is applied to the knee and the medial compartment is entered. A spinal needle is passed into the joint through the site marked for the inferomedial portal. A small incision is made and the dilator passed into the joint. The findings for the medial compartment are unstable degenerative tear body and posterior horn medial meniscus with small area exposed bone medial femoral condyle and tibial plateau . The tear is debrided to a stable base with baskets and a shaver and sealed off with the Arthrocare.  It is probed and found to be stable. There are no  unstable chondral defects in the medial or patellofemoral compartments.    The intercondylar notch is visualized and the ACL appears normal. The lateral compartment is entered and the findings are normal .   The joint is again inspected and there are no other tears, defects or loose bodies identified. The arthroscopic equipment is then removed from the inferior portals which are closed with interrupted 4-0 nylon. 20 ml of .25% Marcaine with epinephrine are injected through the inflow cannula and the cannula is then removed and the portal closed with nylon. The incisions are cleaned and dried and a bulky sterile dressing is applied. The patient is then awakened and transported to recovery in stable condition.   12/28/2014, 7:20 PM

## 2014-12-28 NOTE — Discharge Instructions (Signed)
° °Dr. Shevette Bess °Total Joint Specialist °[facility] °[address] °[place], [place] [address] °[phone] ° ° °Arthroscopic Procedure, Knee °An arthroscopic procedure can find what is wrong with your knee. °PROCEDURE °Arthroscopy is a surgical technique that allows your orthopedic surgeon to diagnose and treat your knee injury with accuracy. They will look into your knee through a small instrument. This is almost like a small (pencil sized) telescope. Because arthroscopy affects your knee less than open knee surgery, you can anticipate a more rapid recovery. Taking an active role by following your caregiver's instructions will help with rapid and complete recovery. Use crutches, rest, elevation, ice, and knee exercises as instructed. The length of recovery depends on various factors including type of injury, age, physical condition, medical conditions, and your rehabilitation. °Your knee is the joint between the large bones (femur and tibia) in your leg. Cartilage covers these bone ends which are smooth and slippery and allow your knee to bend and move smoothly. Two menisci, thick, semi-lunar shaped pads of cartilage which form a rim inside the joint, help absorb shock and stabilize your knee. Ligaments bind the bones together and support your knee joint. Muscles move the joint, help support your knee, and take stress off the joint itself. Because of this all programs and physical therapy to rehabilitate an injured or repaired knee require rebuilding and strengthening your muscles. °AFTER THE PROCEDURE °· After the procedure, you will be moved to a recovery area until most of the effects of the medication have worn off. Your caregiver will discuss the test results with you.  °· Only take over-the-counter or prescription medicines for pain, discomfort, or fever as directed by your caregiver.  °SEEK MEDICAL CARE IF:  °· You have increased bleeding from your wounds.  °· You see  redness, swelling, or have increasing pain in your wounds.  °· You have pus coming from your wound.  °· You have an oral temperature above 102° F (38.9° C).  °· You notice a bad smell coming from the wound or dressing.  °· You have severe pain with any motion of your knee.  °SEEK IMMEDIATE MEDICAL CARE IF:  °· You develop a rash.  °· You have difficulty breathing.  °· You have any allergic problems.  °FURTHER INSTRUCTIONS:  °· ICE to the affected knee every three hours for 30 minutes at a time and then as needed for pain and swelling.  Continue to use ice on the knee for pain and swelling from surgery. You may notice swelling that will progress down to the foot and ankle.  This is normal after surgery.  Elevate the leg when you are not up walking on it.   ° °DIET °You may resume your previous home diet once your are discharged from the hospital. ° °DRESSING / WOUND CARE / SHOWERING °You may start showering two days after being discharged home but do not submerge the incisions under water.  °Change dressing 48 hours after the procedure and then cover the small incisions with band aids until your follow up visit. °Change the surgical dressings daily and reapply a dry dressing each time.  ° °ACTIVITY °Walk with your walker as instructed. °Use walker as long as suggested by your caregivers. °Avoid periods of inactivity such as sitting longer than an hour when not asleep. This helps prevent blood clots.  °You may resume a sexual relationship in one month or when given the OK by your doctor.  °You may return to   work once you are cleared by your doctor.  °Do not drive a car for 6 weeks or until released by you surgeon.  °Do not drive while taking narcotics. ° °POSTOPERATIVE CONSTIPATION PROTOCOL °Constipation - defined medically as fewer than three stools per week and severe constipation as less than one stool per week. ° °One of the most common issues patients have following surgery is constipation.  Even if you have a  regular bowel pattern at home, your normal regimen is likely to be disrupted due to multiple reasons following surgery.  Combination of anesthesia, postoperative narcotics, change in appetite and fluid intake all can affect your bowels.  In order to avoid complications following surgery, here are some recommendations in order to help you during your recovery period. ° °Colace (docusate) - Pick up an over-the-counter form of Colace or another stool softener and take twice a day as long as you are requiring postoperative pain medications.  Take with a full glass of water daily.  If you experience loose stools or diarrhea, hold the colace until you stool forms back up.  If your symptoms do not get better within 1 week or if they get worse, check with your doctor. ° °Dulcolax (bisacodyl) - Pick up over-the-counter and take as directed by the product packaging as needed to assist with the movement of your bowels.  Take with a full glass of water.  Use this product as needed if not relieved by Colace only.  ° °MiraLax (polyethylene glycol) - Pick up over-the-counter to have on hand.  MiraLax is a solution that will increase the amount of water in your bowels to assist with bowel movements.  Take as directed and can mix with a glass of water, juice, soda, coffee, or tea.  Take if you go more than two days without a movement. °Do not use MiraLax more than once per day. Call your doctor if you are still constipated or irregular after using this medication for 7 days in a row. ° °If you continue to have problems with postoperative constipation, please contact the office for further assistance and recommendations.  If you experience "the worst abdominal pain ever" or develop nausea or vomiting, please contact the office immediatly for further recommendations for treatment. ° °ITCHING ° If you experience itching with your medications, try taking only a single pain pill, or even half a pain pill at a time.  You can also use  Benadryl over the counter for itching or also to help with sleep.  ° °TED HOSE STOCKINGS °Wear the elastic stockings on both legs for three weeks following surgery during the day but you may remove then at night for sleeping. ° °MEDICATIONS °See your medication summary on the “After Visit Summary” that the nursing staff will review with you prior to discharge.  You may have some home medications which will be placed on hold until you complete the course of blood thinner medication.  It is important for you to complete the blood thinner medication as prescribed by your surgeon.  Continue your approved medications as instructed at time of discharge. °Do not drive while taking narcotics.  ° °PRECAUTIONS °If you experience chest pain or shortness of breath - call 911 immediately for transfer to the hospital emergency department.  °If you develop a fever greater that 101 F, purulent drainage from wound, increased redness or drainage from wound, foul odor from the wound/dressing, or calf pain - CONTACT YOUR SURGEON.   °                                                °  FOLLOW-UP APPOINTMENTS °Make sure you keep all of your appointments after your operation with your surgeon and caregivers. You should call the office at [phone]  and make an appointment for approximately one week after the date of your surgery or on the date instructed by your surgeon outlined in the "After Visit Summary". ° °RANGE OF MOTION AND STRENGTHENING EXERCISES  °Rehabilitation of the knee is important following a knee injury or an operation. After just a few days of immobilization, the muscles of the thigh which control the knee become weakened and shrink (atrophy). Knee exercises are designed to build up the tone and strength of the thigh muscles and to improve knee motion. Often times heat used for twenty to thirty minutes before working out will loosen up your tissues and help with improving the range of motion but do not use heat for the  first two weeks following surgery. These exercises can be done on a training (exercise) mat, on the floor, on a table or on a bed. Use what ever works the best and is most comfortable for you Knee exercises include: ° °QUAD STRENGTHENING EXERCISES °Strengthening Quadriceps Sets ° °Tighten muscles on top of thigh by pushing knees down into floor or table. °Hold for 20 seconds. Repeat 10 times. °Do 2 sessions per day. ° ° ° ° °Strengthening Terminal Knee Extension ° °With knee bent over bolster, straighten knee by tightening muscle on top of thigh. Be sure to keep bottom of knee on bolster. °Hold for 20 seconds. Repeat 10 times. °Do 2 sessions per day. ° ° °Straight Leg with Bent Knee ° °Lie on back with opposite leg bent. Keep involved knee slightly bent at knee and raise leg 4-6". Hold for 10 seconds. °Repeat 20 times per set. °Do 2 sets per session. °Do 2 sessions per day. ° °

## 2014-12-28 NOTE — Interval H&P Note (Signed)
History and Physical Interval Note:  12/28/2014 6:00 PM  Jerry [name]  has presented today for surgery, with the diagnosis of left knee medial mensicus tear  The various methods of treatment have been discussed with the patient and family. After consideration of risks, benefits and other options for treatment, the patient has consented to  Procedure(s): ARTHROSCOPY LEFT KNEE WITH DEBRIDEMENT (Left) as a surgical intervention .  The patient's history has been reviewed, patient examined, no change in status, stable for surgery.  I have reviewed the patient's chart and labs.  Questions were answered to the patient's satisfaction.     Loanne Drilling

## 2014-12-28 NOTE — Transfer of Care (Signed)
Immediate Anesthesia Transfer of Care Note  Patient: Jerry [name]  Procedure(s) Performed: Procedure(s): ARTHROSCOPY LEFT KNEE WITH  MEDIAL MENSICUS DEBRIDEMENT (Left)  Patient Location: PACU  Anesthesia Type:General  Level of Consciousness:  sedated, patient cooperative and responds to stimulation  Airway & Oxygen Therapy:Patient Spontanous Breathing and Patient connected to face mask oxgen  Post-op Assessment:  Report given to PACU RN and Post -op Vital signs reviewed and stable  Post vital signs:  Reviewed and stable  Last Vitals:  Filed Vitals:   12/28/14 1500  BP: 147/88  Pulse: 81  Temp: 37.1 C  Resp: 20    Complications: No apparent anesthesia complications

## 2014-12-28 NOTE — Anesthesia Postprocedure Evaluation (Signed)
  Anesthesia Post-op Note  Patient: Jerry [name]  Procedure(s) Performed: Procedure(s): ARTHROSCOPY LEFT KNEE WITH  MEDIAL MENSICUS DEBRIDEMENT (Left)  Patient Location: PACU  Anesthesia Type:General  Level of Consciousness: awake  Airway and Oxygen Therapy: Patient Spontanous Breathing  Post-op Pain: mild  Post-op Assessment: Post-op Vital signs reviewed              Post-op Vital Signs: Reviewed  Last Vitals:  Filed Vitals:   12/28/14 1935  BP: 119/81  Pulse: 81  Temp: 36.4 C  Resp: 18    Complications: No apparent anesthesia complications

## 2014-12-28 NOTE — Anesthesia Procedure Notes (Signed)
Procedure Name: LMA Insertion Date/Time: 12/28/2014 6:46 PM Performed by: Early Osmond E Pre-anesthesia Checklist: Patient identified, Emergency Drugs available, Suction available and Patient being monitored Patient Re-evaluated:Patient Re-evaluated prior to inductionOxygen Delivery Method: Circle system utilized Preoxygenation: Pre-oxygenation with 100% oxygen Intubation Type: IV induction Ventilation: Mask ventilation without difficulty LMA: LMA inserted LMA Size: 5.0 Number of attempts: 1 Placement Confirmation: positive ETCO2 and breath sounds checked- equal and bilateral Dental Injury: Teeth and Oropharynx as per pre-operative assessment

## 2014-12-29 ENCOUNTER — Encounter (HOSPITAL_COMMUNITY): Payer: Self-pay | Admitting: Orthopedic Surgery

## 2015-01-03 DIAGNOSIS — Z4789 Encounter for other orthopedic aftercare: Secondary | ICD-10-CM | POA: Diagnosis not present

## 2015-01-03 DIAGNOSIS — S83231D Complex tear of medial meniscus, current injury, right knee, subsequent encounter: Secondary | ICD-10-CM | POA: Diagnosis not present

## 2015-01-26 DIAGNOSIS — Z4789 Encounter for other orthopedic aftercare: Secondary | ICD-10-CM | POA: Diagnosis not present

## 2015-01-26 DIAGNOSIS — S83232D Complex tear of medial meniscus, current injury, left knee, subsequent encounter: Secondary | ICD-10-CM | POA: Diagnosis not present

## 2015-02-24 DIAGNOSIS — Z4789 Encounter for other orthopedic aftercare: Secondary | ICD-10-CM | POA: Diagnosis not present

## 2015-02-24 DIAGNOSIS — M1712 Unilateral primary osteoarthritis, left knee: Secondary | ICD-10-CM | POA: Diagnosis not present

## 2015-03-09 ENCOUNTER — Telehealth: Payer: Self-pay

## 2015-03-09 NOTE — Telephone Encounter (Signed)
Patient has had knee surgery and said as soon as his knee gets better .

## 2015-03-24 ENCOUNTER — Other Ambulatory Visit: Payer: Self-pay | Admitting: Medical

## 2015-03-27 ENCOUNTER — Telehealth: Payer: Self-pay | Admitting: Medical

## 2015-03-27 NOTE — Telephone Encounter (Signed)
Called pt and he said he would call me back to make appt

## 2015-03-27 NOTE — Telephone Encounter (Signed)
I received medication refill request for BP meds.  Med sent, but go ahead and schedule them for a fasting physical

## 2015-03-27 NOTE — Telephone Encounter (Signed)
Is this okay to refill? 

## 2015-03-31 DIAGNOSIS — M1712 Unilateral primary osteoarthritis, left knee: Secondary | ICD-10-CM | POA: Diagnosis not present

## 2015-04-07 DIAGNOSIS — M1712 Unilateral primary osteoarthritis, left knee: Secondary | ICD-10-CM | POA: Diagnosis not present

## 2015-04-14 DIAGNOSIS — M1711 Unilateral primary osteoarthritis, right knee: Secondary | ICD-10-CM | POA: Diagnosis not present

## 2015-04-14 DIAGNOSIS — M1712 Unilateral primary osteoarthritis, left knee: Secondary | ICD-10-CM | POA: Diagnosis not present

## 2015-04-14 DIAGNOSIS — M170 Bilateral primary osteoarthritis of knee: Secondary | ICD-10-CM | POA: Diagnosis not present

## 2015-04-29 ENCOUNTER — Other Ambulatory Visit: Payer: Self-pay | Admitting: Medical

## 2015-05-01 NOTE — Telephone Encounter (Signed)
Is this ok to refill?  

## 2015-05-03 DIAGNOSIS — M170 Bilateral primary osteoarthritis of knee: Secondary | ICD-10-CM | POA: Diagnosis not present

## 2015-05-03 DIAGNOSIS — G5621 Lesion of ulnar nerve, right upper limb: Secondary | ICD-10-CM | POA: Diagnosis not present

## 2015-05-25 DIAGNOSIS — M170 Bilateral primary osteoarthritis of knee: Secondary | ICD-10-CM | POA: Diagnosis not present

## 2015-05-30 DIAGNOSIS — R200 Anesthesia of skin: Secondary | ICD-10-CM | POA: Diagnosis not present

## 2015-05-30 DIAGNOSIS — G5601 Carpal tunnel syndrome, right upper limb: Secondary | ICD-10-CM | POA: Diagnosis not present

## 2015-06-08 ENCOUNTER — Telehealth: Payer: Self-pay

## 2015-06-08 NOTE — Telephone Encounter (Signed)
Received surgical clearance form from [facility].Dr.Jordan cleared patient for upcoming lf knee surgery.Form faxed back to fax # [phone].

## 2015-06-12 DIAGNOSIS — G5601 Carpal tunnel syndrome, right upper limb: Secondary | ICD-10-CM | POA: Diagnosis not present

## 2015-06-20 DIAGNOSIS — R200 Anesthesia of skin: Secondary | ICD-10-CM | POA: Diagnosis not present

## 2015-06-20 DIAGNOSIS — G5601 Carpal tunnel syndrome, right upper limb: Secondary | ICD-10-CM | POA: Diagnosis not present

## 2015-08-03 ENCOUNTER — Ambulatory Visit (INDEPENDENT_AMBULATORY_CARE_PROVIDER_SITE_OTHER): Payer: PPO | Admitting: Cardiology

## 2015-08-03 ENCOUNTER — Encounter: Payer: Self-pay | Admitting: Cardiology

## 2015-08-03 ENCOUNTER — Other Ambulatory Visit: Payer: Self-pay | Admitting: *Deleted

## 2015-08-03 VITALS — BP 126/78 | HR 97 | Ht 76.0 in | Wt 271.0 lb

## 2015-08-03 DIAGNOSIS — E119 Type 2 diabetes mellitus without complications: Secondary | ICD-10-CM | POA: Diagnosis not present

## 2015-08-03 DIAGNOSIS — Z01810 Encounter for preprocedural cardiovascular examination: Secondary | ICD-10-CM | POA: Insufficient documentation

## 2015-08-03 DIAGNOSIS — I10 Essential (primary) hypertension: Secondary | ICD-10-CM

## 2015-08-03 DIAGNOSIS — Z0389 Encounter for observation for other suspected diseases and conditions ruled out: Secondary | ICD-10-CM | POA: Diagnosis not present

## 2015-08-03 DIAGNOSIS — E785 Hyperlipidemia, unspecified: Secondary | ICD-10-CM

## 2015-08-03 DIAGNOSIS — IMO0001 Reserved for inherently not codable concepts without codable children: Secondary | ICD-10-CM | POA: Insufficient documentation

## 2015-08-03 NOTE — Assessment & Plan Note (Signed)
Pt seen today for per op clearance for Lt TKR with DrAlusio

## 2015-08-03 NOTE — Assessment & Plan Note (Signed)
2014 after a false positive Myoview

## 2015-08-03 NOTE — Progress Notes (Signed)
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 The pt had an abnormal Myoview in 2014. Subsequent coronary angiogram showed no significant CAD and normal LVF. He is seen here yearly. He does have DM, HTN, and HLD. He is in the office today for pre clearance for proposed Lt TKR. He denies any history of angina.    Current Outpatient Prescriptions  Medication Sig Dispense Refill  . acetaminophen (TYLENOL) 500 MG tablet Take 1,000 mg by mouth every 6 (six) hours as needed for mild pain.    Marland Kitchen amLODipine (NORVASC) 5 MG tablet TAKE ONE TABLET BY MOUTH ONCE DAILY 180 tablet 0  . aspirin EC 81 MG tablet Take 1 tablet (81 mg total) by mouth daily. 90 tablet 3  . atorvastatin (LIPITOR) 80 MG tablet TAKE ONE TABLET BY MOUTH ONCE DAILY 30 tablet 0  . carvedilol (COREG) 25 MG tablet TAKE ONE TABLET BY MOUTH TWICE DAILY 180 tablet 0  . gabapentin (NEURONTIN) 100 MG capsule Take 1 capsule (100 mg total) by mouth 3 (three) times daily. 90 capsule 1  . Insulin Glargine (LANTUS SOLOSTAR) 100 UNIT/ML Solostar Pen Inject 15 Units into the skin 2 (two) times daily. 15 mL 3  . losartan (COZAAR) 100 MG tablet Take 1 tablet (100 mg total) by mouth daily. 90 tablet 3  . metFORMIN (GLUCOPHAGE) 1000 MG tablet Take 1 tablet (1,000 mg total) by mouth daily. 180 tablet 0  . methocarbamol (ROBAXIN) 500 MG tablet Take 1 tablet (500 mg total) by mouth 4 (four) times daily. As needed for muscle spasm 30 tablet 1  . oxyCODONE (ROXICODONE) 5 MG immediate release tablet Take 1-2 tablets (5-10 mg total) by mouth every 4 (four) hours as needed for severe pain. 50 tablet 0  . tadalafil (CIALIS) 20 MG tablet Take 1 tablet (20 mg total) by mouth daily as needed for erectile dysfunction. 5 tablet 3  . terbinafine (LAMISIL AT) 1 % cream Apply 1 application topically 2  (two) times daily. 30 g 0   No current facility-administered medications for this visit.    Allergies  Allergen Reactions  . Lisinopril Cough  . Voltaren [Diclofenac] Other (See Comments)    Gel caused a burning sensation     Social History   Social History  . Marital Status: Married    Spouse Name: N/A  . Number of Children: N/A  . Years of Education: N/A   Occupational History  . retired     prior physical therapist   Social History Main Topics  . Smoking status: Former Smoker    Types: Cigarettes    Quit date: 11/11/2001  . Smokeless tobacco: Never Used  . Alcohol Use: No  . Drug Use: No  . Sexual Activity: No   Other Topics Concern  . Not on file   Social History Narrative   Married 22 years, 2 daughter and 1 son. Retired, used to work as a PT.  Exercises treadmill daily, some weight bearing exercise. Diet      Review of Systems: General: negative for chills, fever, night sweats or weight changes.  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 All other systems reviewed and are otherwise negative except as noted above.    Blood pressure 126/78, pulse 97, height 6\' 4"  (1.93 m), weight 271 lb (122.925 kg).  General appearance: alert, cooperative, no distress and mildly obese Neck: no carotid bruit and no JVD Lungs: clear to auscultation bilaterally Heart: regular rate and rhythm Extremities: cast Rt forearm Skin: Skin color, texture, turgor normal. No rashes or lesions Neurologic: Grossly normal  EKG NSR, LVH  by volt  ASSESSMENT AND PLAN:   Pre-operative cardiovascular examination Pt seen today for per op clearance for Lt TKR with DrAlusio  Normal coronary arteries 2014 after a false positive Myoview  Type 2 diabetes mellitus without complication (HCC) Type 2 diabetes with obesity   Essential hypertension, benign Controlled  Dyslipidemia LDL 76 one year ago   PLAN  Mr Jerry [name] is an acceptable cardiovascular risk for knee replacement. We will be available for any peri-op cardiac issues.   Laural Benes K PA-C 08/03/2015 11:49 AM

## 2015-08-03 NOTE — Assessment & Plan Note (Signed)
Controlled.  

## 2015-08-03 NOTE — Assessment & Plan Note (Addendum)
Type 2 diabetes with obesity

## 2015-08-03 NOTE — Assessment & Plan Note (Signed)
LDL 76 one year ago

## 2015-08-03 NOTE — Patient Instructions (Signed)
YOU HAVE BEEN CLEARED FROM A CARDIOVASCULAR STANDPOINT FOR YOUR UPCOMING KNEE SURGERY.  A LETTER WILL BE SENT TO DR. Despina Hick.  Your physician recommends that you schedule a follow-up appointment in: ONE YEAR WITH DR. [place]

## 2015-08-11 ENCOUNTER — Other Ambulatory Visit: Payer: Self-pay | Admitting: Medical

## 2015-08-11 DIAGNOSIS — G5601 Carpal tunnel syndrome, right upper limb: Secondary | ICD-10-CM | POA: Diagnosis not present

## 2015-08-11 DIAGNOSIS — G5621 Lesion of ulnar nerve, right upper limb: Secondary | ICD-10-CM | POA: Diagnosis not present

## 2015-08-11 DIAGNOSIS — Z4789 Encounter for other orthopedic aftercare: Secondary | ICD-10-CM | POA: Diagnosis not present

## 2015-09-07 ENCOUNTER — Ambulatory Visit: Payer: Self-pay | Admitting: Orthopedic Surgery

## 2015-09-07 NOTE — Progress Notes (Signed)
Preoperative surgical orders have been place into the Epic hospital system for Jerry [name] on 09/07/2015, 10:35 AM  by Patrica Duel for surgery on 10-04-15.  Preop Total Knee orders including Experal, IV Tylenol, and IV Decadron as long as there are no contraindications to the above medications. [name], PA-C

## 2015-09-28 ENCOUNTER — Encounter (HOSPITAL_COMMUNITY)
Admission: RE | Admit: 2015-09-28 | Discharge: 2015-09-28 | Disposition: A | Discharge status: Home / Self Care | Payer: PPO | Source: Ambulatory Visit | Attending: Orthopedic Surgery | Admitting: Orthopedic Surgery

## 2015-09-28 ENCOUNTER — Encounter (HOSPITAL_COMMUNITY): Payer: Self-pay

## 2015-09-28 DIAGNOSIS — M1712 Unilateral primary osteoarthritis, left knee: Secondary | ICD-10-CM | POA: Diagnosis not present

## 2015-09-28 DIAGNOSIS — Z01810 Encounter for preprocedural cardiovascular examination: Secondary | ICD-10-CM | POA: Insufficient documentation

## 2015-09-28 DIAGNOSIS — Z01812 Encounter for preprocedural laboratory examination: Secondary | ICD-10-CM | POA: Insufficient documentation

## 2015-09-28 LAB — CBC
HCT: 35.2 % — ABNORMAL LOW (ref 39.0–52.0)
HEMOGLOBIN: 12.8 g/dL — AB (ref 13.0–17.0)
MCH: 33.9 pg (ref 26.0–34.0)
MCHC: 36.4 g/dL — ABNORMAL HIGH (ref 30.0–36.0)
MCV: 93.1 fL (ref 78.0–100.0)
PLATELETS: 198 10*3/uL (ref 150–400)
RBC: 3.78 MIL/uL — ABNORMAL LOW (ref 4.22–5.81)
RDW: 12.2 % (ref 11.5–15.5)
WBC: 5 10*3/uL (ref 4.0–10.5)

## 2015-09-28 LAB — URINALYSIS, ROUTINE W REFLEX MICROSCOPIC
Bilirubin Urine: NEGATIVE
GLUCOSE, UA: 500 mg/dL — AB
HGB URINE DIPSTICK: NEGATIVE
Ketones, ur: NEGATIVE mg/dL
Leukocytes, UA: NEGATIVE
Nitrite: NEGATIVE
PH: 5.5 (ref 5.0–8.0)
Protein, ur: NEGATIVE mg/dL
SPECIFIC GRAVITY, URINE: 1.031 — AB (ref 1.005–1.030)

## 2015-09-28 LAB — COMPREHENSIVE METABOLIC PANEL
ALK PHOS: 49 U/L (ref 38–126)
ALT: 17 U/L (ref 17–63)
ANION GAP: 10 (ref 5–15)
AST: 19 U/L (ref 15–41)
Albumin: 4.2 g/dL (ref 3.5–5.0)
BUN: 10 mg/dL (ref 6–20)
CALCIUM: 9.3 mg/dL (ref 8.9–10.3)
CO2: 26 mmol/L (ref 22–32)
Chloride: 105 mmol/L (ref 101–111)
Creatinine, Ser: 0.72 mg/dL (ref 0.61–1.24)
GFR calc non Af Amer: >60 mL/min (ref >60)
Glucose, Bld: 171 mg/dL — ABNORMAL HIGH (ref 65–99)
Potassium: 4.1 mmol/L (ref 3.5–5.1)
SODIUM: 141 mmol/L (ref 135–145)
Total Bilirubin: 0.8 mg/dL (ref 0.3–1.2)
Total Protein: 7.2 g/dL (ref 6.5–8.1)

## 2015-09-28 LAB — PROTIME-INR
INR: 1.07 (ref 0.00–1.49)
Prothrombin Time: 13.7 seconds (ref 11.6–15.2)

## 2015-09-28 LAB — APTT: aPTT: 35 seconds (ref 24–37)

## 2015-09-28 LAB — SURGICAL PCR SCREEN
MRSA, PCR: NEGATIVE
STAPHYLOCOCCUS AUREUS: POSITIVE — AB

## 2015-09-28 LAB — ABO/RH: ABO/RH(D): A POS

## 2015-09-28 NOTE — Patient Instructions (Signed)
Jerry [name]  09/28/2015   Your procedure is scheduled on: Wednesday 10/04/2015  Report to [facility] Main  Entrance take [place]  elevators to 3rd floor to  Short Stay Center at  1015 AM.  Call this number if you have problems the morning of surgery [phone]   Remember: ONLY 1 PERSON MAY GO WITH YOU TO SHORT STAY TO GET  READY MORNING OF YOUR SURGERY.   Do not eat food or drink liquids :After Midnight.   TAKE 1/2 DOSE OF LANTUS INSULIN THE NIGHT BEFORE SURGERY!   Take these medicines the morning of surgery with A SIP OF WATER: Amlodipine (Norvasc), Carvedilol (Coreg), Gabapentin (Neurontin)              DO NOT TAKE ANY DIABETIC MEDICATIONS DAY OF YOUR SURGERY!                               You may not have any metal on your body including hair pins and              piercings  Do not wear jewelry, make-up, lotions, powders or perfumes, deodorant             Do not wear nail polish.  Do not shave  48 hours prior to surgery.              Men may shave face and neck.   Do not bring valuables to the hospital. Jerry [name] IS NOT             RESPONSIBLE   FOR VALUABLES.  Contacts, dentures or bridgework may not be worn into surgery.  Leave suitcase in the car. After surgery it may be brought to your room.                 Please read over the following fact sheets you were given: _____________________________________________________________________             [facility] - Preparing for Surgery Before surgery, you can play an important role.  Because skin is not sterile, your skin needs to be as free of germs as possible.  You can reduce the number of germs on your skin by washing with CHG (chlorahexidine gluconate) soap before surgery.  CHG is an antiseptic cleaner which kills germs and bonds with the skin to continue killing germs even after washing. Please DO NOT use if you have an allergy to CHG or antibacterial soaps.  If your skin becomes  reddened/irritated stop using the CHG and inform your nurse when you arrive at Short Stay. Do not shave (including legs and underarms) for at least 48 hours prior to the first CHG shower.  You may shave your face/neck. Please follow these instructions carefully:  1.  Shower with CHG Soap the night before surgery and the  morning of Surgery.  2.  If you choose to wash your hair, wash your hair first as usual with your  normal  shampoo.  3.  After you shampoo, rinse your hair and body thoroughly to remove the  shampoo.                           4.  Use CHG as you would any other liquid soap.  You can apply chg directly  to the  skin and wash                       Gently with a scrungie or clean washcloth.  5.  Apply the CHG Soap to your body ONLY FROM THE NECK DOWN.   Do not use on face/ open                           Wound or open sores. Avoid contact with eyes, ears mouth and genitals (private parts).                       Wash face,  Genitals (private parts) with your normal soap.             6.  Wash thoroughly, paying special attention to the area where your surgery  will be performed.  7.  Thoroughly rinse your body with warm water from the neck down.  8.  DO NOT shower/wash with your normal soap after using and rinsing off  the CHG Soap.                9.  Pat yourself dry with a clean towel.            10.  Wear clean pajamas.            11.  Place clean sheets on your bed the night of your first shower and do not  sleep with pets. Day of Surgery : Do not apply any lotions/deodorants the morning of surgery.  Please wear clean clothes to the hospital/surgery center.  FAILURE TO FOLLOW THESE INSTRUCTIONS Jerry [name] RESULT IN THE CANCELLATION OF YOUR SURGERY PATIENT SIGNATURE_________________________________  NURSE SIGNATURE__________________________________  ________________________________________________________________________   Jerry [name]  An incentive spirometer is a tool that  can help keep your lungs clear and active. This tool measures how well you are filling your lungs with each breath. Taking long deep breaths Jerry [name] help reverse or decrease the chance of developing breathing (pulmonary) problems (especially infection) following:  A long period of time when you are unable to move or be active. BEFORE THE PROCEDURE   If the spirometer includes an indicator to show your best effort, your nurse or respiratory therapist will set it to a desired goal.  If possible, sit up straight or lean slightly forward. Try not to slouch.  Hold the incentive spirometer in an upright position. INSTRUCTIONS FOR USE  1. Sit on the edge of your bed if possible, or sit up as far as you can in bed or on a chair. 2. Hold the incentive spirometer in an upright position. 3. Breathe out normally. 4. Place the mouthpiece in your mouth and seal your lips tightly around it. 5. Breathe in slowly and as deeply as possible, raising the piston or the ball toward the top of the column. 6. Hold your breath for 3-5 seconds or for as long as possible. Allow the piston or ball to fall to the bottom of the column. 7. Remove the mouthpiece from your mouth and breathe out normally. 8. Rest for a few seconds and repeat Steps 1 through 7 at least 10 times every 1-2 hours when you are awake. Take your time and take a few normal breaths between deep breaths. 9. The spirometer Jerry [name] include an indicator to show your best effort. Use the indicator as a goal to work toward during each repetition. 10. After each set of  10 deep breaths, practice coughing to be sure your lungs are clear. If you have an incision (the cut made at the time of surgery), support your incision when coughing by placing a pillow or rolled up towels firmly against it. Once you are able to get out of bed, walk around indoors and cough well. You Jerry [name] stop using the incentive spirometer when instructed by your caregiver.  RISKS AND  COMPLICATIONS  Take your time so you do not get dizzy or light-headed.  If you are in pain, you Jerry [name] need to take or ask for pain medication before doing incentive spirometry. It is harder to take a deep breath if you are having pain. AFTER USE  Rest and breathe slowly and easily.  It can be helpful to keep track of a log of your progress. Your caregiver can provide you with a simple table to help with this. If you are using the spirometer at home, follow these instructions: [place] IF:   You are having difficultly using the spirometer.  You have trouble using the spirometer as often as instructed.  Your pain medication is not giving enough relief while using the spirometer.  You develop fever of 100.5 F (38.1 C) or higher. SEEK IMMEDIATE MEDICAL CARE IF:   You cough up bloody sputum that had not been present before.  You develop fever of 102 F (38.9 C) or greater.  You develop worsening pain at or near the incision site. MAKE SURE YOU:   Understand these instructions.  Will watch your condition.  Will get help right away if you are not doing well or get worse. Document Released: 11/04/2006 Document Revised: 09/16/2011 Document Reviewed: 01/05/2007 [facility] Patient Information 2014 [place], [place].   ________________________________________________________________________

## 2015-09-28 NOTE — Progress Notes (Addendum)
08/03/2015-noted in EPIC-EKG 05/31/2015-Pre-operative clearance from Dr. [place] on chart.

## 2015-09-28 NOTE — Progress Notes (Signed)
   09/28/15 1501  OBSTRUCTIVE SLEEP APNEA  Have you ever been diagnosed with sleep apnea through a sleep study? No  Do you snore loudly (loud enough to be heard through closed doors)?  1  Do you often feel tired, fatigued, or sleepy during the daytime (such as falling asleep during driving or talking to someone)? 0  Has anyone observed you stop breathing during your sleep? 0  Do you have, or are you being treated for high blood pressure? 1  BMI more than 35 kg/m2? 0  Age > 50 (1-yes) 1  Neck circumference greater than:Male 16 inches or larger, Male 17inches or larger? 1  Male Gender (Yes=1) 1  Obstructive Sleep Apnea Score 5  Score 5 or greater  Results sent to PCP

## 2015-09-29 LAB — HEMOGLOBIN A1C
HEMOGLOBIN A1C: 6.2 % — AB (ref 4.8–5.6)
MEAN PLASMA GLUCOSE: 131 mg/dL

## 2015-10-03 ENCOUNTER — Ambulatory Visit: Payer: Self-pay | Admitting: Orthopedic Surgery

## 2015-10-03 MED ORDER — DEXTROSE 5 % IV SOLN
3.0000 g | INTRAVENOUS | Status: AC
  Administered 2015-10-04: 3 g via INTRAVENOUS
  Filled 2015-10-03 (×2): qty 3000

## 2015-10-03 NOTE — H&P (Signed)
Jerry [name] DOB: 12/17/1955 Married / Language: English / Race: Black or African American Male Date of Admission:  10/04/2015 CC:  Left Knee Pain History of Present Illness The patient is a 60 year old male who comes in for a preoperative History and Physical. The patient is scheduled for a left total knee arthroplasty to be performed by Dr. Frank V. Aluisio, MD at [facility] on 10-04-2015. The patient is a 59 year old male who presented for follow up of their knee. The patient is being followed for their bilateral knee pain and osteoarthritis. They are now a coupel of months out from Synvisc series in the left knee; 5 months out from cortisone in the right knee. Symptoms reported include: pain, swelling, aching, stiffness and difficulty ambulating. The patient feels that they are doing poorly and report their pain level to be moderate to severe (worse at night). The patient has not gotten any relief of their symptoms with viscosupplementation. Unfortunately, the arthroscopy, cortisone and Synvisc have not helped. He is getting progressively worse. He is limiting what he can and cannot do. He want a more permanent solution at this point.There is nothing else has helped him. He is ready to get the left knee fixed at this time. They have been treated conservatively in the past for the above stated problem and despite conservative measures, they continue to have progressive pain and severe functional limitations and dysfunction. They have failed non-operative management including home exercise, medications, and injections. It is felt that they would benefit from undergoing total joint replacement. Risks and benefits of the procedure have been discussed with the patient and they elect to proceed with surgery. There are no active contraindications to surgery such as ongoing infection or rapidly progressive neurological disease.   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  Family History First Degree Relatives  reported Diabetes Mellitus  mother Hypertension  mother Rheumatoid Arthritis  mother  Social History Tobacco use  current some days smoker; smoke(d) 1 pack(s) per day Tobacco / smoke exposure  yes outdoors only Marital status  married Living situation  live with spouse Pain Contract  no Number of flights of stairs before winded  1 Drug/Alcohol Rehab (Previously)  no Drug/Alcohol Rehab (Currently)  no Illicit drug use  no Exercise  Exercises daily; does other Children  4 Alcohol use  current drinker; drinks beer; only occasionally per week Current work status  disabled Current some day smoker   Medication History MetFORMIN HCl (850MG Tablet, Oral) Active. Lantus SoloStar (100UNIT/ML Solution, Subcutaneous) Active. Aspirin (81MG Tablet, 1 (one) Oral) Active. Percocet (10-325MG Tablet, Oral) Active. Vitamin B-12 Active. HydroCHLOROthiazide (12.5MG Tablet, Oral) Active.  Past Surgical History Arthroscopy of Knee  left Appendectomy  Right Arm Surgery     Review of Systems General Not Present- Chills, Fatigue, Fever, Memory Loss, Night Sweats, Weight Gain and Weight Loss. Skin Not Present- Eczema, Hives, Itching, Lesions and Rash. HEENT Not Present- Dentures, Double Vision, Headache, Hearing Loss, Tinnitus and Visual Loss. Respiratory Not Present- Allergies, Chronic Cough, Coughing up blood, Shortness of breath at rest and Shortness of breath with exertion. Cardiovascular Not Present- Chest Pain, Difficulty Breathing Lying Down, Murmur, Palpitations, Racing/skipping heartbeats and Swelling. Gastrointestinal  Not Present- Abdominal   Pain, Bloody Stool, Constipation, Diarrhea, Difficulty Swallowing, Heartburn, Jaundice, Loss of appetitie, Nausea and Vomiting. Male Genitourinary Not Present- Blood in Urine, Discharge, Flank Pain, Incontinence, Painful Urination, Urgency, Urinary frequency, Urinary Retention, Urinating at Night and Weak urinary stream. Musculoskeletal Present- Joint Pain. Not Present- Back Pain, Joint Swelling, Morning Stiffness, Muscle Pain, Muscle Weakness and Spasms. Neurological Not Present- Blackout spells, Difficulty with balance, Dizziness, Paralysis, Tremor and Weakness. Psychiatric Not Present- Insomnia.  Vitals Weight: 278 lb Height: 76in Weight was reported by patient. Height was reported by patient. Body Surface Area: 2.55 m Body Mass Index: 33.84 kg/m  BP: 122/78 (Sitting, Left Arm, Standard)  Physical Exam General Mental Status -Alert, cooperative and good historian. General Appearance-pleasant, Not in acute distress. Orientation-Oriented X3. Build & Nutrition-Well nourished and Well developed.  Head and Neck Head-normocephalic, atraumatic . Neck Global Assessment - supple, no bruit auscultated on the right, no bruit auscultated on the left.  Eye Vision -Note: reading glasses.  Pupil - Bilateral-Regular and Round. Motion - Bilateral-EOMI.  ENMT Note: partial denture plate   Chest and Lung Exam Auscultation Breath sounds - clear at anterior chest wall and clear at posterior chest wall. Adventitious sounds - No Adventitious sounds.  Cardiovascular Auscultation Rhythm - Regular rate and rhythm. Heart Sounds - S1 WNL and S2 WNL. Murmurs & Other Heart Sounds - Auscultation of the heart reveals - No Murmurs.  Abdomen Palpation/Percussion Tenderness - Abdomen is non-tender to palpation. Rigidity (guarding) - Abdomen is soft. Auscultation Auscultation of the abdomen reveals - Bowel sounds normal.  Male Genitourinary Note: Not  done, not pertinent to present illness   Musculoskeletal Note: On exam, he is alert and oriented, in no apparent distress. His left knee shows no effusion. His range of about 5 to 125. Marked crepitus on range of motion. Tenderness medial greater than lateral, no instability.  RADIOGRAPHS X-rays from last visit as his arthritis is definitely progressed. He now has bone on bone medial and is closed to bone on bone lateral.  Assessment & Plan Primary osteoarthritis of right knee (M17.11) Primary osteoarthritis of left knee (M17.12)  Note:Surgical Plans: Left Total Knee Replacement  Disposition: Home  PCP: Dr. Jordan - Patient has been seen preoperatively and felt to be stable for surgery.  IV TXA  Anesthesia Issues: None  Signed electronically by Jerry [name], III PA-C 

## 2015-10-04 ENCOUNTER — Encounter (HOSPITAL_COMMUNITY)
Admission: RE | Disposition: A | Discharge status: Home Health | Payer: Self-pay | Source: Ambulatory Visit | Attending: Orthopedic Surgery

## 2015-10-04 ENCOUNTER — Inpatient Hospital Stay (HOSPITAL_COMMUNITY)
Admission: RE | Admit: 2015-10-04 | Discharge: 2015-10-06 | DRG: 470 | Disposition: A | Discharge status: Home Health | Payer: PPO | Source: Ambulatory Visit | Attending: Orthopedic Surgery | Admitting: Orthopedic Surgery

## 2015-10-04 ENCOUNTER — Encounter (HOSPITAL_COMMUNITY): Payer: Self-pay | Admitting: Certified Registered Nurse Anesthetist

## 2015-10-04 ENCOUNTER — Inpatient Hospital Stay (HOSPITAL_COMMUNITY): Payer: PPO | Admitting: Certified Registered Nurse Anesthetist

## 2015-10-04 DIAGNOSIS — F17210 Nicotine dependence, cigarettes, uncomplicated: Secondary | ICD-10-CM | POA: Diagnosis present

## 2015-10-04 DIAGNOSIS — I2510 Atherosclerotic heart disease of native coronary artery without angina pectoris: Secondary | ICD-10-CM | POA: Diagnosis present

## 2015-10-04 DIAGNOSIS — E291 Testicular hypofunction: Secondary | ICD-10-CM | POA: Diagnosis present

## 2015-10-04 DIAGNOSIS — Z8249 Family history of ischemic heart disease and other diseases of the circulatory system: Secondary | ICD-10-CM

## 2015-10-04 DIAGNOSIS — F329 Major depressive disorder, single episode, unspecified: Secondary | ICD-10-CM | POA: Diagnosis present

## 2015-10-04 DIAGNOSIS — Z833 Family history of diabetes mellitus: Secondary | ICD-10-CM

## 2015-10-04 DIAGNOSIS — Z888 Allergy status to other drugs, medicaments and biological substances status: Secondary | ICD-10-CM | POA: Diagnosis not present

## 2015-10-04 DIAGNOSIS — Z8261 Family history of arthritis: Secondary | ICD-10-CM | POA: Diagnosis not present

## 2015-10-04 DIAGNOSIS — E785 Hyperlipidemia, unspecified: Secondary | ICD-10-CM | POA: Diagnosis present

## 2015-10-04 DIAGNOSIS — G5600 Carpal tunnel syndrome, unspecified upper limb: Secondary | ICD-10-CM | POA: Diagnosis present

## 2015-10-04 DIAGNOSIS — M170 Bilateral primary osteoarthritis of knee: Principal | ICD-10-CM | POA: Diagnosis present

## 2015-10-04 DIAGNOSIS — N529 Male erectile dysfunction, unspecified: Secondary | ICD-10-CM | POA: Diagnosis present

## 2015-10-04 DIAGNOSIS — I10 Essential (primary) hypertension: Secondary | ICD-10-CM | POA: Diagnosis present

## 2015-10-04 DIAGNOSIS — M1712 Unilateral primary osteoarthritis, left knee: Secondary | ICD-10-CM

## 2015-10-04 DIAGNOSIS — E119 Type 2 diabetes mellitus without complications: Secondary | ICD-10-CM | POA: Diagnosis present

## 2015-10-04 DIAGNOSIS — J45909 Unspecified asthma, uncomplicated: Secondary | ICD-10-CM | POA: Diagnosis present

## 2015-10-04 DIAGNOSIS — M069 Rheumatoid arthritis, unspecified: Secondary | ICD-10-CM | POA: Diagnosis present

## 2015-10-04 DIAGNOSIS — M179 Osteoarthritis of knee, unspecified: Secondary | ICD-10-CM | POA: Diagnosis present

## 2015-10-04 DIAGNOSIS — M1710 Unilateral primary osteoarthritis, unspecified knee: Secondary | ICD-10-CM | POA: Diagnosis present

## 2015-10-04 HISTORY — PX: TOTAL KNEE ARTHROPLASTY: SHX125

## 2015-10-04 LAB — GLUCOSE, CAPILLARY
Glucose-Capillary: 154 mg/dL — ABNORMAL HIGH (ref 65–99)
Glucose-Capillary: 124 mg/dL — ABNORMAL HIGH (ref 65–99)
Glucose-Capillary: 295 mg/dL — ABNORMAL HIGH (ref 65–99)
Glucose-Capillary: 333 mg/dL — ABNORMAL HIGH (ref 65–99)

## 2015-10-04 LAB — TYPE AND SCREEN
ABO/RH(D): A POS
Antibody Screen: NEGATIVE

## 2015-10-04 SURGERY — ARTHROPLASTY, KNEE, TOTAL
Anesthesia: Spinal | Site: Knee | Laterality: Left

## 2015-10-04 MED ORDER — MEPERIDINE HCL 50 MG/ML IJ SOLN: 6.2500 mg | INTRAMUSCULAR | Status: DC | PRN

## 2015-10-04 MED ORDER — ONDANSETRON HCL 4 MG PO TABS
4.0000 mg | ORAL_TABLET | Freq: Four times a day (QID) | ORAL | Status: DC | PRN
  Administered 2015-10-05: 4 mg via ORAL
  Filled 2015-10-04: qty 1

## 2015-10-04 MED ORDER — BUPIVACAINE HCL (PF) 0.25 % IJ SOLN
INTRAMUSCULAR | Status: AC
  Filled 2015-10-04: qty 30

## 2015-10-04 MED ORDER — HYDROMORPHONE HCL 1 MG/ML IJ SOLN
INTRAMUSCULAR | Status: AC
  Filled 2015-10-04: qty 2

## 2015-10-04 MED ORDER — AMLODIPINE BESYLATE 5 MG PO TABS
5.0000 mg | ORAL_TABLET | Freq: Every day | ORAL | Status: DC
  Administered 2015-10-05 – 2015-10-06 (×2): 5 mg via ORAL
  Filled 2015-10-04 (×2): qty 1

## 2015-10-04 MED ORDER — DEXAMETHASONE SODIUM PHOSPHATE 10 MG/ML IJ SOLN
10.0000 mg | Freq: Once | INTRAMUSCULAR | Status: AC
  Administered 2015-10-04: 10 mg via INTRAVENOUS

## 2015-10-04 MED ORDER — DIPHENHYDRAMINE HCL 12.5 MG/5ML PO ELIX: 12.5000 mg | ORAL_SOLUTION | ORAL | Status: DC | PRN

## 2015-10-04 MED ORDER — PHENOL 1.4 % MT LIQD: 1.0000 | OROMUCOSAL | Status: DC | PRN

## 2015-10-04 MED ORDER — ACETAMINOPHEN 10 MG/ML IV SOLN
1000.0000 mg | Freq: Once | INTRAVENOUS | Status: AC
  Administered 2015-10-04: 1000 mg via INTRAVENOUS
  Filled 2015-10-04: qty 100

## 2015-10-04 MED ORDER — ACETAMINOPHEN 650 MG RE SUPP: 650.0000 mg | Freq: Four times a day (QID) | RECTAL | Status: DC | PRN

## 2015-10-04 MED ORDER — BUPIVACAINE IN DEXTROSE 0.75-8.25 % IT SOLN
INTRATHECAL | Status: DC | PRN
  Administered 2015-10-04: 2 mL via INTRATHECAL

## 2015-10-04 MED ORDER — MIDAZOLAM HCL 5 MG/5ML IJ SOLN
INTRAMUSCULAR | Status: DC | PRN
  Administered 2015-10-04: 2 mg via INTRAVENOUS

## 2015-10-04 MED ORDER — BUPIVACAINE HCL 0.25 % IJ SOLN
INTRAMUSCULAR | Status: DC | PRN
  Administered 2015-10-04: 20 mL

## 2015-10-04 MED ORDER — CEFAZOLIN SODIUM-DEXTROSE 2-4 GM/100ML-% IV SOLN
2.0000 g | Freq: Four times a day (QID) | INTRAVENOUS | Status: AC
  Administered 2015-10-04 (×2): 2 g via INTRAVENOUS
  Filled 2015-10-04 (×2): qty 100

## 2015-10-04 MED ORDER — HYDROMORPHONE HCL 1 MG/ML IJ SOLN
0.2500 mg | INTRAMUSCULAR | Status: DC | PRN
  Administered 2015-10-04 (×4): 0.5 mg via INTRAVENOUS

## 2015-10-04 MED ORDER — TRANEXAMIC ACID 1000 MG/10ML IV SOLN
1000.0000 mg | Freq: Once | INTRAVENOUS | Status: AC
  Administered 2015-10-04: 1000 mg via INTRAVENOUS
  Filled 2015-10-04: qty 10

## 2015-10-04 MED ORDER — METFORMIN HCL 500 MG PO TABS
1000.0000 mg | ORAL_TABLET | Freq: Two times a day (BID) | ORAL | Status: DC
  Filled 2015-10-04: qty 2

## 2015-10-04 MED ORDER — ACETAMINOPHEN 325 MG PO TABS
650.0000 mg | ORAL_TABLET | Freq: Four times a day (QID) | ORAL | Status: DC | PRN
Start: 2015-10-05 — End: 2015-10-06

## 2015-10-04 MED ORDER — ONDANSETRON HCL 4 MG/2ML IJ SOLN
INTRAMUSCULAR | Status: AC
  Filled 2015-10-04: qty 2

## 2015-10-04 MED ORDER — MENTHOL 3 MG MT LOZG: 1.0000 | LOZENGE | OROMUCOSAL | Status: DC | PRN

## 2015-10-04 MED ORDER — METHOCARBAMOL 1000 MG/10ML IJ SOLN
500.0000 mg | Freq: Four times a day (QID) | INTRAVENOUS | Status: DC | PRN
  Administered 2015-10-04: 500 mg via INTRAVENOUS
  Filled 2015-10-04 (×2): qty 5

## 2015-10-04 MED ORDER — LACTATED RINGERS IV SOLN
INTRAVENOUS | Status: DC | PRN
  Administered 2015-10-04 (×3): via INTRAVENOUS

## 2015-10-04 MED ORDER — SODIUM CHLORIDE 0.9 % IR SOLN
Status: DC | PRN
  Administered 2015-10-04: 1000 mL

## 2015-10-04 MED ORDER — ONDANSETRON HCL 4 MG/2ML IJ SOLN: 4.0000 mg | Freq: Four times a day (QID) | INTRAMUSCULAR | Status: DC | PRN

## 2015-10-04 MED ORDER — PROPOFOL 10 MG/ML IV BOLUS
INTRAVENOUS | Status: DC | PRN
  Administered 2015-10-04: 10 mg via INTRAVENOUS
  Administered 2015-10-04 (×2): 20 mg via INTRAVENOUS

## 2015-10-04 MED ORDER — METHOCARBAMOL 500 MG PO TABS
500.0000 mg | ORAL_TABLET | Freq: Four times a day (QID) | ORAL | Status: DC | PRN
  Administered 2015-10-04 – 2015-10-06 (×4): 500 mg via ORAL
  Filled 2015-10-04 (×4): qty 1

## 2015-10-04 MED ORDER — TRANEXAMIC ACID 1000 MG/10ML IV SOLN
1000.0000 mg | INTRAVENOUS | Status: AC
  Administered 2015-10-04: 1000 mg via INTRAVENOUS
  Filled 2015-10-04: qty 10

## 2015-10-04 MED ORDER — OXYCODONE HCL 5 MG PO TABS
10.0000 mg | ORAL_TABLET | ORAL | Status: DC | PRN
  Administered 2015-10-04: 10 mg via ORAL
  Administered 2015-10-04: 15 mg via ORAL
  Administered 2015-10-05 – 2015-10-06 (×9): 20 mg via ORAL
  Administered 2015-10-06: 10 mg via ORAL
  Filled 2015-10-04: qty 3
  Filled 2015-10-04 (×7): qty 4
  Filled 2015-10-04: qty 2
  Filled 2015-10-04 (×3): qty 4

## 2015-10-04 MED ORDER — TRAMADOL HCL 50 MG PO TABS
50.0000 mg | ORAL_TABLET | Freq: Four times a day (QID) | ORAL | Status: DC | PRN
  Administered 2015-10-06: 100 mg via ORAL
  Filled 2015-10-04: qty 2

## 2015-10-04 MED ORDER — SODIUM CHLORIDE 0.9 % IV SOLN
INTRAVENOUS | Status: DC
  Administered 2015-10-04 – 2015-10-05 (×2): via INTRAVENOUS

## 2015-10-04 MED ORDER — ONDANSETRON HCL 4 MG/2ML IJ SOLN
4.0000 mg | Freq: Once | INTRAMUSCULAR | Status: DC | PRN
Start: 2015-10-04 — End: 2015-10-04

## 2015-10-04 MED ORDER — METOCLOPRAMIDE HCL 5 MG/ML IJ SOLN: 5.0000 mg | Freq: Three times a day (TID) | INTRAMUSCULAR | Status: DC | PRN

## 2015-10-04 MED ORDER — PROPOFOL 10 MG/ML IV BOLUS
INTRAVENOUS | Status: AC
  Filled 2015-10-04: qty 40

## 2015-10-04 MED ORDER — BUPIVACAINE LIPOSOME 1.3 % IJ SUSP
INTRAMUSCULAR | Status: DC | PRN
  Administered 2015-10-04: 20 mL

## 2015-10-04 MED ORDER — PROPOFOL 10 MG/ML IV BOLUS
INTRAVENOUS | Status: AC
  Filled 2015-10-04: qty 20

## 2015-10-04 MED ORDER — ACETAMINOPHEN 10 MG/ML IV SOLN
INTRAVENOUS | Status: AC
  Filled 2015-10-04: qty 100

## 2015-10-04 MED ORDER — DEXAMETHASONE SODIUM PHOSPHATE 10 MG/ML IJ SOLN
10.0000 mg | Freq: Once | INTRAMUSCULAR | Status: AC
  Administered 2015-10-05: 10 mg via INTRAVENOUS
  Filled 2015-10-04: qty 1

## 2015-10-04 MED ORDER — BISACODYL 10 MG RE SUPP: 10.0000 mg | Freq: Every day | RECTAL | Status: DC | PRN

## 2015-10-04 MED ORDER — METHYLPREDNISOLONE ACETATE 40 MG/ML IJ SUSP
INTRAMUSCULAR | Status: AC
  Filled 2015-10-04: qty 2

## 2015-10-04 MED ORDER — MORPHINE SULFATE (PF) 2 MG/ML IV SOLN
1.0000 mg | INTRAVENOUS | Status: DC | PRN
  Administered 2015-10-04: 1 mg via INTRAVENOUS
  Filled 2015-10-04: qty 1

## 2015-10-04 MED ORDER — PROPOFOL 500 MG/50ML IV EMUL
INTRAVENOUS | Status: DC | PRN
  Administered 2015-10-04: 75 ug/kg/min via INTRAVENOUS

## 2015-10-04 MED ORDER — SODIUM CHLORIDE 0.9 % IV SOLN: INTRAVENOUS | Status: DC

## 2015-10-04 MED ORDER — ONDANSETRON HCL 4 MG/2ML IJ SOLN
INTRAMUSCULAR | Status: DC | PRN
  Administered 2015-10-04: 4 mg via INTRAVENOUS

## 2015-10-04 MED ORDER — MIDAZOLAM HCL 2 MG/2ML IJ SOLN
INTRAMUSCULAR | Status: AC
  Filled 2015-10-04: qty 2

## 2015-10-04 MED ORDER — SODIUM CHLORIDE 0.9 % IJ SOLN
INTRAMUSCULAR | Status: AC
  Filled 2015-10-04: qty 50

## 2015-10-04 MED ORDER — ACETAMINOPHEN 500 MG PO TABS
1000.0000 mg | ORAL_TABLET | Freq: Four times a day (QID) | ORAL | Status: AC
  Administered 2015-10-04 – 2015-10-05 (×4): 1000 mg via ORAL
  Filled 2015-10-04 (×4): qty 2

## 2015-10-04 MED ORDER — INSULIN ASPART 100 UNIT/ML ~~LOC~~ SOLN
0.0000 [IU] | Freq: Three times a day (TID) | SUBCUTANEOUS | Status: DC
  Administered 2015-10-04: 5 [IU] via SUBCUTANEOUS
  Administered 2015-10-05: 3 [IU] via SUBCUTANEOUS
  Administered 2015-10-05: 5 [IU] via SUBCUTANEOUS
  Administered 2015-10-06: 3 [IU] via SUBCUTANEOUS

## 2015-10-04 MED ORDER — METOCLOPRAMIDE HCL 10 MG PO TABS: 5.0000 mg | ORAL_TABLET | Freq: Three times a day (TID) | ORAL | Status: DC | PRN

## 2015-10-04 MED ORDER — DEXAMETHASONE SODIUM PHOSPHATE 10 MG/ML IJ SOLN
INTRAMUSCULAR | Status: AC
  Filled 2015-10-04: qty 1

## 2015-10-04 MED ORDER — CARVEDILOL 25 MG PO TABS
25.0000 mg | ORAL_TABLET | Freq: Two times a day (BID) | ORAL | Status: DC
  Administered 2015-10-04 – 2015-10-06 (×4): 25 mg via ORAL
  Filled 2015-10-04 (×5): qty 1

## 2015-10-04 MED ORDER — DOCUSATE SODIUM 100 MG PO CAPS
100.0000 mg | ORAL_CAPSULE | Freq: Two times a day (BID) | ORAL | Status: DC
  Administered 2015-10-04 – 2015-10-06 (×4): 100 mg via ORAL

## 2015-10-04 MED ORDER — POLYETHYLENE GLYCOL 3350 17 G PO PACK
17.0000 g | PACK | Freq: Every day | ORAL | Status: DC | PRN
  Administered 2015-10-05: 17 g via ORAL
  Filled 2015-10-04: qty 1

## 2015-10-04 MED ORDER — SODIUM CHLORIDE 0.9 % IJ SOLN
INTRAMUSCULAR | Status: DC | PRN
  Administered 2015-10-04: 30 mL

## 2015-10-04 MED ORDER — RIVAROXABAN 10 MG PO TABS
10.0000 mg | ORAL_TABLET | Freq: Every day | ORAL | Status: DC
Start: 2015-10-05 — End: 2015-10-06
  Administered 2015-10-05 – 2015-10-06 (×2): 10 mg via ORAL
  Filled 2015-10-04 (×3): qty 1

## 2015-10-04 MED ORDER — FLEET ENEMA 7-19 GM/118ML RE ENEM
1.0000 | ENEMA | Freq: Once | RECTAL | Status: DC | PRN
Start: 2015-10-04 — End: 2015-10-06

## 2015-10-04 MED ORDER — INSULIN GLARGINE 100 UNIT/ML ~~LOC~~ SOLN: 15.0000 [IU] | Freq: Every day | SUBCUTANEOUS | Status: DC

## 2015-10-04 MED ORDER — 0.9 % SODIUM CHLORIDE (POUR BTL) OPTIME
TOPICAL | Status: DC | PRN
  Administered 2015-10-04: 1000 mL

## 2015-10-04 MED ORDER — TRANEXAMIC ACID 1000 MG/10ML IV SOLN
1000.0000 mg | INTRAVENOUS | Status: DC
  Filled 2015-10-04: qty 10

## 2015-10-04 MED ORDER — INSULIN GLARGINE 100 UNIT/ML ~~LOC~~ SOLN
15.0000 [IU] | Freq: Every day | SUBCUTANEOUS | Status: DC
  Administered 2015-10-04 – 2015-10-05 (×2): 15 [IU] via SUBCUTANEOUS
  Filled 2015-10-04 (×3): qty 0.15

## 2015-10-04 MED ORDER — FENTANYL CITRATE (PF) 100 MCG/2ML IJ SOLN
INTRAMUSCULAR | Status: AC
  Filled 2015-10-04: qty 2

## 2015-10-04 MED ORDER — BUPIVACAINE LIPOSOME 1.3 % IJ SUSP
20.0000 mL | Freq: Once | INTRAMUSCULAR | Status: DC
  Filled 2015-10-04: qty 20

## 2015-10-04 MED ORDER — CHLORHEXIDINE GLUCONATE 4 % EX LIQD: 60.0000 mL | Freq: Once | CUTANEOUS | Status: DC

## 2015-10-04 MED ORDER — FENTANYL CITRATE (PF) 100 MCG/2ML IJ SOLN
INTRAMUSCULAR | Status: DC | PRN
  Administered 2015-10-04 (×2): 50 ug via INTRAVENOUS

## 2015-10-04 SURGICAL SUPPLY — 50 items
BAG DECANTER FOR FLEXI CONT (MISCELLANEOUS) IMPLANT
BAG ZIPLOCK 12X15 (MISCELLANEOUS) IMPLANT
BANDAGE ACE 6X5 VEL STRL LF (GAUZE/BANDAGES/DRESSINGS) ×3 IMPLANT
BLADE SAG 18X100X1.27 (BLADE) ×3 IMPLANT
BLADE SAW SGTL 11.0X1.19X90.0M (BLADE) ×3 IMPLANT
BOWL SMART MIX CTS (DISPOSABLE) ×3 IMPLANT
CAPT KNEE TOTAL 3 ATTUNE ×3 IMPLANT
CEMENT HV SMART SET (Cement) ×6 IMPLANT
CLOSURE WOUND 1/2 X4 (GAUZE/BANDAGES/DRESSINGS) ×2
CLOTH BEACON ORANGE TIMEOUT ST (SAFETY) ×3 IMPLANT
CUFF TOURN SGL QUICK 34 (TOURNIQUET CUFF) ×2
CUFF TRNQT CYL 34X4X40X1 (TOURNIQUET CUFF) ×1 IMPLANT
DECANTER SPIKE VIAL GLASS SM (MISCELLANEOUS) ×3 IMPLANT
DRAPE U-SHAPE 47X51 STRL (DRAPES) ×3 IMPLANT
DRSG ADAPTIC 3X8 NADH LF (GAUZE/BANDAGES/DRESSINGS) ×3 IMPLANT
DRSG PAD ABDOMINAL 8X10 ST (GAUZE/BANDAGES/DRESSINGS) ×3 IMPLANT
DURAPREP 26ML APPLICATOR (WOUND CARE) ×3 IMPLANT
ELECT REM PT RETURN 9FT ADLT (ELECTROSURGICAL) ×3
ELECTRODE REM PT RTRN 9FT ADLT (ELECTROSURGICAL) ×1 IMPLANT
EVACUATOR 1/8 PVC DRAIN (DRAIN) ×3 IMPLANT
GAUZE SPONGE 4X4 12PLY STRL (GAUZE/BANDAGES/DRESSINGS) ×3 IMPLANT
GLOVE BIO SURGEON STRL SZ7.5 (GLOVE) ×3 IMPLANT
GLOVE BIO SURGEON STRL SZ8 (GLOVE) ×3 IMPLANT
GLOVE BIOGEL PI IND STRL 6.5 (GLOVE) IMPLANT
GLOVE BIOGEL PI IND STRL 8 (GLOVE) ×2 IMPLANT
GLOVE BIOGEL PI INDICATOR 6.5 (GLOVE)
GLOVE BIOGEL PI INDICATOR 8 (GLOVE) ×4
GLOVE SURG SS PI 6.5 STRL IVOR (GLOVE) IMPLANT
GOWN STRL REUS W/TWL LRG LVL3 (GOWN DISPOSABLE) ×3 IMPLANT
GOWN STRL REUS W/TWL XL LVL3 (GOWN DISPOSABLE) ×3 IMPLANT
HANDPIECE INTERPULSE COAX TIP (DISPOSABLE) ×2
IMMOBILIZER KNEE 20 (SOFTGOODS) ×3
IMMOBILIZER KNEE 20 THIGH 36 (SOFTGOODS) ×1 IMPLANT
MANIFOLD NEPTUNE II (INSTRUMENTS) ×3 IMPLANT
NS IRRIG 1000ML POUR BTL (IV SOLUTION) ×3 IMPLANT
PACK TOTAL KNEE CUSTOM (KITS) ×3 IMPLANT
PADDING CAST COTTON 6X4 STRL (CAST SUPPLIES) ×6 IMPLANT
POSITIONER SURGICAL ARM (MISCELLANEOUS) ×3 IMPLANT
SET HNDPC FAN SPRY TIP SCT (DISPOSABLE) ×1 IMPLANT
STRIP CLOSURE SKIN 1/2X4 (GAUZE/BANDAGES/DRESSINGS) ×4 IMPLANT
SUT MNCRL AB 4-0 PS2 18 (SUTURE) ×3 IMPLANT
SUT VIC AB 2-0 CT1 27 (SUTURE) ×6
SUT VIC AB 2-0 CT1 TAPERPNT 27 (SUTURE) ×3 IMPLANT
SUT VLOC 180 0 24IN GS25 (SUTURE) ×3 IMPLANT
SYR 50ML LL SCALE MARK (SYRINGE) ×3 IMPLANT
TRAY FOLEY W/METER SILVER 14FR (SET/KITS/TRAYS/PACK) IMPLANT
TRAY FOLEY W/METER SILVER 16FR (SET/KITS/TRAYS/PACK) ×3 IMPLANT
WATER STERILE IRR 1500ML POUR (IV SOLUTION) ×3 IMPLANT
WRAP KNEE MAXI GEL POST OP (GAUZE/BANDAGES/DRESSINGS) ×3 IMPLANT
YANKAUER SUCT BULB TIP 10FT TU (MISCELLANEOUS) ×3 IMPLANT

## 2015-10-04 NOTE — Op Note (Addendum)
Pre-operative diagnosis- Osteoarthritis  Left knee(s)    Osteoarthritis right knee  Post-operative diagnosis- Osteoarthritis Left knee(s)    Osteoarthritis right knee  Procedure-  Left  Total Knee Arthroplasty   Right knee aspiration and cortisone injection  Surgeon- Gus Rankin. Rasheda Ledger, MD  Assistant- [name], PA-C   Anesthesia-  Spinal  EBL-* No blood loss amount entered *   Drains Hemovac  Tourniquet time-  Total Tourniquet Time Documented: Thigh (Left) - 36 minutes Total: Thigh (Left) - 36 minutes     Complications- None  Condition-PACU - hemodynamically stable.   Brief Clinical Note   Jerry [name] is a 60 y.o. year old male with end stage OA of his left knee with progressively worsening pain and dysfunction. He has constant pain, with activity and at rest and significant functional deficits with difficulties even with ADLs. He has had extensive non-op management including analgesics, injections of cortisone and viscosupplements, and home exercise program, but remains in significant pain with significant dysfunction. Radiographs show bone on bone arthritis medial. He presents now for left Total Knee Arthroplasty. He also has osteoarthritis of his right knee with a large effusion and requests aspiration and cortisone injection.    Procedure in detail---   The patient is brought into the operating room and positioned supine on the operating table. After successful administration of  General,   a tourniquet is placed high on the  Left thigh(s) and the lower extremity is prepped and draped in the usual sterile fashion. Time out is performed by the operating team and then the  Left lower extremity is wrapped in Esmarch, knee flexed and the tourniquet inflated to 300 mmHg.       A midline incision is made with a ten blade through the subcutaneous tissue to the level of the extensor mechanism. A fresh blade is used to make a medial parapatellar arthrotomy. Soft tissue over the  proximal medial tibia is subperiosteally elevated to the joint line with a knife and into the semimembranosus bursa with a Cobb elevator. Soft tissue over the proximal lateral tibia is elevated with attention being paid to avoiding the patellar tendon on the tibial tubercle. The patella is everted, knee flexed 90 degrees and the ACL and PCL are removed. Findings are bone on bone medial and patellofemoral with large global osteophytes.        The drill is used to create a starting hole in the distal femur and the canal is thoroughly irrigated with sterile saline to remove the fatty contents. The 5 degree Left  valgus alignment guide is placed into the femoral canal and the distal femoral cutting block is pinned to remove 10 mm off the distal femur. Resection is made with an oscillating saw.      The tibia is subluxed forward and the menisci are removed. The extramedullary alignment guide is placed referencing proximally at the medial aspect of the tibial tubercle and distally along the second metatarsal axis and tibial crest. The block is pinned to remove 7mm off the more deficient medial  side. Resection is made with an oscillating saw. Size 7is the most appropriate size for the tibia and the proximal tibia is prepared with the modular drill and keel punch for that size.      The femoral sizing guide is placed and size 7 is most appropriate. Rotation is marked off the epicondylar axis and confirmed by creating a rectangular flexion gap at 90 degrees. The size 7 cutting block is pinned  in this rotation and the anterior, posterior and chamfer cuts are made with the oscillating saw. The intercondylar block is then placed and that cut is made.      Trial size 7 tibial component, trial size 7 posterior stabilized femur and a 6  mm posterior stabilized rotating platform insert trial is placed. Full extension is achieved with excellent varus/valgus and anterior/posterior balance throughout full range of motion. The  patella is everted and thickness measured to be 27  mm. Free hand resection is taken to 15 mm, a 41 template is placed, lug holes are drilled, trial patella is placed, and it tracks normally. Osteophytes are removed off the posterior femur with the trial in place. All trials are removed and the cut bone surfaces prepared with pulsatile lavage. Cement is mixed and once ready for implantation, the size 7 tibial implant, size  7 posterior stabilized femoral component, and the size 41 patella are cemented in place and the patella is held with the clamp. The trial insert is placed and the knee held in full extension. The Exparel (20 ml mixed with 30 ml saline) and .25% Bupivicaine, are injected into the extensor mechanism, posterior capsule, medial and lateral gutters and subcutaneous tissues.  All extruded cement is removed and once the cement is hard the permanent 6 mm posterior stabilized rotating platform insert is placed into the tibial tray.      The wound is copiously irrigated with saline solution and the extensor mechanism closed over a hemovac drain with #1 V-loc suture. The tourniquet is released for a total tourniquet time of 36  minutes. Flexion against gravity is 140 degrees and the patella tracks normally. Subcutaneous tissue is closed with 2.0 vicryl and subcuticular with running 4.0 Monocryl. The incision is cleaned and dried and steri-strips and a bulky sterile dressing are applied. The limb is placed into a knee immobilizer.      I then prepped the right knee with Betadine and aspirated 60 ml of blood tinged synovial fluid, subsequently injecting the joint with 80 mg Depomedrol. The patient is awakened and transported to recovery in stable condition.      Please note that a surgical assistant was a medical necessity for this procedure in order to perform it in a safe and expeditious manner. Surgical assistant was necessary to retract the ligaments and vital neurovascular structures to prevent injury  to them and also necessary for proper positioning of the limb to allow for anatomic placement of the prosthesis.   [name], MD    10/04/2015, 1:10 PM

## 2015-10-04 NOTE — Transfer of Care (Signed)
Immediate Anesthesia Transfer of Care Note  Patient: Jerry [name]  Procedure(s) Performed: Procedure(s): LEFT TOTAL KNEE ARTHROPLASTY right knee aspiration and injection (Left)  Patient Location: PACU  Anesthesia Type:Spinal  Level of Consciousness:  sedated, patient cooperative and responds to stimulation  Airway & Oxygen Therapy:Patient Spontanous Breathing and Patient connected to face mask oxgen  Post-op Assessment:  Report given to PACU RN and Post -op Vital signs reviewed and stable  Post vital signs:  Reviewed and stable, spinal L 2  Last Vitals:  Filed Vitals:   10/04/15 1030  BP: 150/84  Pulse: 80  Temp: 36.4 C  Resp: 20    Complications: No apparent anesthesia complications

## 2015-10-04 NOTE — H&P (View-Only) (Signed)
Jerry [name] DOB: 27-Sep-1955 Married / Language: [place] / Race: Black or African American Male Date of Admission:  10/04/2015 CC:  Left Knee Pain History of Present Illness The patient is a 60 year old male who comes in for a preoperative History and Physical. The patient is scheduled for a left total knee arthroplasty to be performed by Dr. Gus Rankin. Aluisio, MD at [facility] on 10-04-2015. The patient is a 60 year old male who presented for follow up of their knee. The patient is being followed for their bilateral knee pain and osteoarthritis. They are now a coupel of months out from Synvisc series in the left knee; 5 months out from cortisone in the right knee. Symptoms reported include: pain, swelling, aching, stiffness and difficulty ambulating. The patient feels that they are doing poorly and report their pain level to be moderate to severe (worse at night). The patient has not gotten any relief of their symptoms with viscosupplementation. Unfortunately, the arthroscopy, cortisone and Synvisc have not helped. He is getting progressively worse. He is limiting what he can and cannot do. He want a more permanent solution at this point.There is nothing else has helped him. He is ready to get the left knee fixed at this time. They have been treated conservatively in the past for the above stated problem and despite conservative measures, they continue to have progressive pain and severe functional limitations and dysfunction. They have failed non-operative management including home exercise, medications, and injections. It is felt that they would benefit from undergoing total joint replacement. Risks and benefits of the procedure have been discussed with the patient and they elect to proceed with surgery. There are no active contraindications to surgery such as ongoing infection or rapidly progressive neurological disease.   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  Family History First Degree Relatives  reported Diabetes Mellitus  mother Hypertension  mother Rheumatoid Arthritis  mother  Social History Tobacco use  current some days smoker; smoke(d) 1 pack(s) per day Tobacco / smoke exposure  yes outdoors only Marital status  married Living situation  live with spouse Pain Contract  no Number of flights of stairs before winded  1 Drug/Alcohol Rehab (Previously)  no Drug/Alcohol Rehab (Currently)  no Illicit drug use  no Exercise  Exercises daily; does other Children  4 Alcohol use  current drinker; drinks beer; only occasionally per week Current work status  disabled Current some day smoker   Medication History MetFORMIN HCl (850MG  Tablet, Oral) Active. Lantus SoloStar (100UNIT/ML Solution, Subcutaneous) Active. Aspirin (81MG  Tablet, 1 (one) Oral) Active. Percocet (10-325MG  Tablet, Oral) Active. Vitamin B-12 Active. HydroCHLOROthiazide (12.5MG  Tablet, Oral) Active.  Past Surgical History Arthroscopy of Knee  left Appendectomy  Right Arm Surgery     Review of Systems General Not Present- Chills, Fatigue, Fever, Memory Loss, Night Sweats, Weight Gain and Weight Loss. Skin Not Present- Eczema, Hives, Itching, Lesions and Rash. HEENT Not Present- Dentures, Double Vision, Headache, Hearing Loss, Tinnitus and Visual Loss. Respiratory Not Present- Allergies, Chronic Cough, Coughing up blood, Shortness of breath at rest and Shortness of breath with exertion. Cardiovascular Not Present- Chest Pain, Difficulty Breathing Lying Down, Murmur, Palpitations, Racing/skipping heartbeats and Swelling. Gastrointestinal  Not Present- Abdominal  Pain, Bloody Stool, Constipation, Diarrhea, Difficulty Swallowing, Heartburn, Jaundice, Loss of appetitie, Nausea and Vomiting. Male Genitourinary Not Present- Blood in Urine, Discharge, Flank Pain, Incontinence, Painful Urination, Urgency, Urinary frequency, Urinary Retention, Urinating at Night and Weak urinary stream. Musculoskeletal Present- Joint Pain. Not Present- Back Pain, Joint Swelling, Morning Stiffness, Muscle Pain, Muscle Weakness and Spasms. Neurological Not Present- Blackout spells, Difficulty with balance, Dizziness, Paralysis, Tremor and Weakness. Psychiatric Not Present- Insomnia.  Vitals Weight: 278 lb Height: 76in Weight was reported by patient. Height was reported by patient. Body Surface Area: 2.55 m Body Mass Index: 33.84 kg/m  BP: 122/78 (Sitting, Left Arm, Standard)  Physical Exam General Mental Status -Alert, cooperative and good historian. General Appearance-pleasant, Not in acute distress. Orientation-Oriented X3. Build & Nutrition-Well nourished and Well developed.  Head and Neck Head-normocephalic, atraumatic . Neck Global Assessment - supple, no bruit auscultated on the right, no bruit auscultated on the left.  Eye Vision -Note: reading glasses.  Pupil - Bilateral-Regular and Round. Motion - Bilateral-EOMI.  ENMT Note: partial denture plate   Chest and Lung Exam Auscultation Breath sounds - clear at anterior chest wall and clear at posterior chest wall. Adventitious sounds - No Adventitious sounds.  Cardiovascular Auscultation Rhythm - Regular rate and rhythm. Heart Sounds - S1 WNL and S2 WNL. Murmurs & Other Heart Sounds - Auscultation of the heart reveals - No Murmurs.  Abdomen Palpation/Percussion Tenderness - Abdomen is non-tender to palpation. Rigidity (guarding) - Abdomen is soft. Auscultation Auscultation of the abdomen reveals - Bowel sounds normal.  Male Genitourinary Note: Not  done, not pertinent to present illness   Musculoskeletal Note: On exam, he is alert and oriented, in no apparent distress. His left knee shows no effusion. His range of about 5 to 125. Marked crepitus on range of motion. Tenderness medial greater than lateral, no instability.  RADIOGRAPHS X-rays from last visit as his arthritis is definitely progressed. He now has bone on bone medial and is closed to bone on bone lateral.  Assessment & Plan Primary osteoarthritis of right knee (M17.11) Primary osteoarthritis of left knee (M17.12)  Note:Surgical Plans: Left Total Knee Replacement  Disposition: Home  PCP: Dr. Sw[place] - Patient has been seen preoperatively and felt to be stable for surgery.  IV TXA  Anesthesia Issues: None  Signed electronically by Beckey Rutter, III PA-C

## 2015-10-04 NOTE — Interval H&P Note (Signed)
History and Physical Interval Note:  10/04/2015 11:38 AM  Jerry [name]  has presented today for surgery, with the diagnosis of left knee osteoarthritis  The various methods of treatment have been discussed with the patient and family. After consideration of risks, benefits and other options for treatment, the patient has consented to  Procedure(s): LEFT TOTAL KNEE ARTHROPLASTY (Left) as a surgical intervention .  The patient's history has been reviewed, patient examined, no change in status, stable for surgery.  I have reviewed the patient's chart and labs.  Questions were answered to the patient's satisfaction.     Loanne Drilling

## 2015-10-04 NOTE — Anesthesia Procedure Notes (Signed)
Spinal Patient location during procedure: OR Start time: 10/04/2015 11:57 AM End time: 10/04/2015 12:01 PM Staffing Anesthesiologist: Arta Bruce Resident/CRNA: Lyda Kalata R Performed by: resident/CRNA  Preanesthetic Checklist Completed: patient identified, site marked, surgical consent, pre-op evaluation, timeout performed, IV checked, risks and benefits discussed and monitors and equipment checked Spinal Block Patient position: sitting Prep: ChloraPrep Patient monitoring: heart rate, continuous pulse ox and blood pressure Approach: midline Location: L3-4 Injection technique: single-shot Needle Needle type: Sprotte  Needle gauge: 24 G Needle length: 10 cm Needle insertion depth: 8 cm Assessment Sensory level: T6 Additional Notes Lot [id number] Exp 2016-12-05

## 2015-10-04 NOTE — Anesthesia Preprocedure Evaluation (Signed)
Anesthesia Evaluation  Patient identified by MRN, date of birth, ID band Patient awake    Reviewed: Allergy & Precautions, NPO status   Airway Mallampati: I  TM Distance: >3 FB Neck ROM: Full    Dental   Pulmonary former smoker,    Pulmonary exam normal        Cardiovascular hypertension, Pt. on medications Normal cardiovascular exam     Neuro/Psych    GI/Hepatic   Endo/Other  diabetes, Type 2, Oral Hypoglycemic Agents  Renal/GU      Musculoskeletal   Abdominal   Peds  Hematology   Anesthesia Other Findings   Reproductive/Obstetrics                             Anesthesia Physical Anesthesia Plan  ASA: II  Anesthesia Plan: Spinal   Post-op Pain Management:    Induction: Intravenous  Airway Management Planned: Natural Airway  Additional Equipment:   Intra-op Plan:   Post-operative Plan:   Informed Consent: I have reviewed the patients History and Physical, chart, labs and discussed the procedure including the risks, benefits and alternatives for the proposed anesthesia with the patient or authorized representative who has indicated his/her understanding and acceptance.     Plan Discussed with: CRNA and Surgeon  Anesthesia Plan Comments:         Anesthesia Quick Evaluation

## 2015-10-04 NOTE — Anesthesia Postprocedure Evaluation (Signed)
Anesthesia Post Note  Patient: Jerry [name]  Procedure(s) Performed: Procedure(s) (LRB): LEFT TOTAL KNEE ARTHROPLASTY right knee aspiration and injection (Left)  Patient location during evaluation: PACU Anesthesia Type: Spinal and MAC Level of consciousness: awake and alert Pain management: pain level controlled Vital Signs Assessment: post-procedure vital signs reviewed and stable Respiratory status: spontaneous breathing and respiratory function stable Cardiovascular status: blood pressure returned to baseline and stable Postop Assessment: spinal receding Anesthetic complications: no    Last Vitals:  Filed Vitals:   10/04/15 1600 10/04/15 1615  BP:  134/78  Pulse: 79   Temp:  36.7 C  Resp: 15 16    Last Pain:  Filed Vitals:   10/04/15 1618  PainSc: 4                  Erven Ramson DAVID

## 2015-10-05 LAB — CBC
HCT: 29.3 % — ABNORMAL LOW (ref 39.0–52.0)
Hemoglobin: 10.8 g/dL — ABNORMAL LOW (ref 13.0–17.0)
MCH: 34.3 pg — ABNORMAL HIGH (ref 26.0–34.0)
MCHC: 36.9 g/dL — ABNORMAL HIGH (ref 30.0–36.0)
MCV: 93 fL (ref 78.0–100.0)
PLATELETS: 307 10*3/uL (ref 150–400)
RBC: 3.15 MIL/uL — AB (ref 4.22–5.81)
RDW: 12.5 % (ref 11.5–15.5)
WBC: 14.9 10*3/uL — AB (ref 4.0–10.5)

## 2015-10-05 LAB — BASIC METABOLIC PANEL
ANION GAP: 9 (ref 5–15)
BUN: 9 mg/dL (ref 6–20)
CALCIUM: 8.9 mg/dL (ref 8.9–10.3)
CO2: 26 mmol/L (ref 22–32)
Chloride: 101 mmol/L (ref 101–111)
Creatinine, Ser: 0.78 mg/dL (ref 0.61–1.24)
GFR calc Af Amer: >60 mL/min (ref >60)
Glucose, Bld: 176 mg/dL — ABNORMAL HIGH (ref 65–99)
POTASSIUM: 4 mmol/L (ref 3.5–5.1)
SODIUM: 136 mmol/L (ref 135–145)

## 2015-10-05 LAB — GLUCOSE, CAPILLARY
GLUCOSE-CAPILLARY: 190 mg/dL — AB (ref 65–99)
GLUCOSE-CAPILLARY: 169 mg/dL — AB (ref 65–99)
Glucose-Capillary: 221 mg/dL — ABNORMAL HIGH (ref 65–99)
Glucose-Capillary: 211 mg/dL — ABNORMAL HIGH (ref 65–99)

## 2015-10-05 MED ORDER — ALUM & MAG HYDROXIDE-SIMETH 200-200-20 MG/5ML PO SUSP
30.0000 mL | ORAL | Status: DC | PRN
  Administered 2015-10-05: 30 mL via ORAL
  Filled 2015-10-05: qty 30

## 2015-10-05 NOTE — Progress Notes (Signed)
Notified Drew,PA of pt having CBG-333. Order given to give pt 15 units of Lantus tonight.

## 2015-10-05 NOTE — Progress Notes (Signed)
Physical Therapy Treatment Patient Details Name: ARNOLD KESTER MRN: [id number] DOB: January 26, 1956 Today's Date: 10/05/2015    History of Present Illness L TKA, fluid drawn from R and received a cortisone shot to R knee    PT Comments    The patient is progressing well, has been drowsy from medication, per patient. Plans DC tomorrow.  Follow Up Recommendations  Home health PT;Supervision/Assistance - 24 hour     Equipment Recommendations  None recommended by PT    Recommendations for Other Services       Precautions / Restrictions Precautions Precautions: Knee Required Braces or Orthoses: Knee Immobilizer - Left Knee Immobilizer - Left: Discontinue once straight leg raise with < 10 degree lag    Mobility  Bed Mobility Overal bed mobility: Needs Assistance Bed Mobility: Supine to Sit;Sit to Supine     Supine to sit: Min assist Sit to supine: Min assist   General bed mobility comments: assist with L leg  Transfers Overall transfer level: Needs assistance Equipment used: Rolling walker (2 wheeled) Transfers: Sit to/from Stand Sit to Stand: Supervision;From elevated surface         General transfer comment: cues for hand and L leg position  Ambulation/Gait Ambulation/Gait assistance: Supervision   Assistive device: Rolling walker (2 wheeled) Gait Pattern/deviations: Step-through pattern     General Gait Details: cues for sequence   Stairs            Wheelchair Mobility    Modified Rankin (Stroke Patients Only)       Balance                                    Cognition Arousal/Alertness: Awake/alert Behavior During Therapy: WFL for tasks assessed/performed Overall Cognitive Status: Within Functional Limits for tasks assessed                      Exercises Total Joint Exercises Ankle Circles/Pumps: AROM;Both;10 reps;Supine Quad Sets: AROM;Both;10 reps;Supine Short Arc Quad: AAROM;Left;10 reps;Supine Heel Slides:  AAROM;Left;10 reps;Supine Hip ABduction/ADduction: AAROM;Left;10 reps;Supine Straight Leg Raises: AAROM;Left;10 reps;Supine    General Comments        Pertinent Vitals/Pain Pain Assessment: 0-10 Pain Score: 7  Pain Location: L knee Pain Descriptors / Indicators: Aching;Tightness Pain Intervention(s): Limited activity within patient's tolerance;Monitored during session;Repositioned;Ice applied;Patient requesting pain meds-RN notified    Home Living Family/patient expects to be discharged to:: Private residence Living Arrangements: Spouse/significant other Available Help at Discharge: Family Type of Home: House Home Access: Stairs to enter Entrance Stairs-Rails: Can reach both Home Layout: One level Home Equipment: Cane - single point;Walker - 2 wheels      Prior Function Level of Independence: Independent          PT Goals (current goals can now be found in the care plan section) Acute Rehab PT Goals Patient Stated Goal: to go home tomorrow Progress towards PT goals: Progressing toward goals    Frequency  7X/week    PT Plan Current plan remains appropriate    Co-evaluation             End of Session   Activity Tolerance: Patient tolerated treatment well Patient left: in bed (w/ ortho tach)     Time: 1829-9371 PT Time Calculation (min) (ACUTE ONLY): 17 min  Charges:  $Gait Training: 8-22 mins $Therapeutic Exercise: 8-22 mins $Self Care/Home Management: 8-22  G Codes:      Rada Hay 10/05/2015, 3:18 PM Blanchard Kelch PT [phone]

## 2015-10-05 NOTE — Progress Notes (Signed)
OT Cancellation Note  Patient Details Name: Jerry [name] MRN: [id number] DOB: Jun 30, 1956   Cancelled Treatment:    Reason Eval/Treat Not Completed: Fatigue/lethargy limiting ability to participate  Pt very sleepy, in bed s/p pain meds.  Pt falling asleep mid sentence.  Will recheck on pt later this afternoon or in the morning.  Lise Auer, [place] [phone]  Einar Crow D 10/05/2015, 1:27 PM

## 2015-10-05 NOTE — Evaluation (Signed)
Physical Therapy Evaluation Patient Details Name: Jerry [name] MRN: [id number] DOB: Mar 23, 1956 Today's Date: 10/05/2015   History of Present Illness  L TKA, fluid drawn from R and received a cortisone shot to R knee  Clinical Impression  The patient tolerated ambulation well today.  Patient will benefit from PT to address the problems listed below.    Follow Up Recommendations Home health PT;Supervision/Assistance - 24 hour    Equipment Recommendations  None recommended by PT    Recommendations for Other Services       Precautions / Restrictions Precautions Precautions: Knee Required Braces or Orthoses: Knee Immobilizer - Left Knee Immobilizer - Left: Discontinue once straight leg raise with < 10 degree lag      Mobility  Bed Mobility Overal bed mobility: Needs Assistance Bed Mobility: Supine to Sit;Sit to Supine     Supine to sit: Min assist Sit to supine: Min assist   General bed mobility comments: assist with L leg  Transfers Overall transfer level: Needs assistance Equipment used: Rolling walker (2 wheeled) Transfers: Sit to/from Stand Sit to Stand: Supervision            Ambulation/Gait Ambulation/Gait assistance: Supervision   Assistive device: Rolling walker (2 wheeled) Gait Pattern/deviations: Step-to pattern;Decreased stance time - left;Decreased step length - left     General Gait Details: cues for sequence  Stairs            Wheelchair Mobility    Modified Rankin (Stroke Patients Only)       Balance                                             Pertinent Vitals/Pain Pain Assessment: 0-10 Pain Score: 7  Pain Location: L knee Pain Descriptors / Indicators: Aching Pain Intervention(s): Limited activity within patient's tolerance    Home Living Family/patient expects to be discharged to:: Private residence Living Arrangements: Spouse/significant other Available Help at Discharge: Family Type of Home:  House Home Access: Stairs to enter Entrance Stairs-Rails: Can reach both Entrance Stairs-Number of Steps: 3 Home Layout: One level Home Equipment: Cane - single point;Walker - 2 wheels      Prior Function Level of Independence: Independent               Hand Dominance        Extremity/Trunk Assessment   Upper Extremity Assessment: Defer to OT evaluation           Lower Extremity Assessment: LLE deficits/detail   LLE Deficits / Details: 10-50 knee flexion, lacks SLR  Cervical / Trunk Assessment: Normal  Communication   Communication: No difficulties  Cognition Arousal/Alertness: Awake/alert Behavior During Therapy: WFL for tasks assessed/performed Overall Cognitive Status: Within Functional Limits for tasks assessed                      General Comments      Exercises Total Joint Exercises Ankle Circles/Pumps: AROM;Both;10 reps;Supine Quad Sets: AROM;Both;10 reps;Supine Short Arc Quad: AAROM;Left;10 reps;Supine Heel Slides: AAROM;Left;10 reps;Supine Hip ABduction/ADduction: AAROM;Left;10 reps;Supine Straight Leg Raises: AAROM;Left;10 reps;Supine      Assessment/Plan    PT Assessment Patient needs continued PT services  PT Diagnosis Difficulty walking;Acute pain   PT Problem List Decreased strength;Decreased range of motion;Decreased activity tolerance;Decreased mobility;Pain;Decreased safety awareness  PT Treatment Interventions DME instruction;Gait training;Stair training;Functional mobility training;Therapeutic activities;Therapeutic exercise;Patient/family education  PT Goals (Current goals can be found in the Care Plan section) Acute Rehab PT Goals Patient Stated Goal: to go home tomorrow    Frequency 7X/week   Barriers to discharge        Co-evaluation               End of Session   Activity Tolerance: Patient tolerated treatment well Patient left: in bed;with call bell/phone within reach;with bed alarm set Nurse  Communication: Mobility status         Time: 0165-5374 PT Time Calculation (min) (ACUTE ONLY): 40 min   Charges:   PT Evaluation $PT Eval Low Complexity: 1 Procedure PT Treatments $Gait Training: 8-22 mins $Therapeutic Exercise: 8-22 mins $Self Care/Home Management: 8-22   PT G Codes:        Rada Hay 10/05/2015, 1:43 PM Blanchard Kelch PT [phone]

## 2015-10-05 NOTE — Progress Notes (Signed)
   Subjective: 1 Day Post-Op Procedure(s) (LRB): LEFT TOTAL KNEE ARTHROPLASTY right knee aspiration and injection (Left) Patient reports pain as mild.   Patient seen in rounds by Dr. Lequita Halt. Patient is well, but has had some minor complaints of pain in the knee, requiring pain medications We will start therapy today.  Plan is to go Home after hospital stay.  Objective: Vital signs in last 24 hours: Temp:  [97.7 F (36.5 C)-98.7 F (37.1 C)] 97.8 F (36.6 C) (03/30 1318) Pulse Rate:  [77-99] 77 (03/30 1318) Resp:  [15-20] 16 (03/30 1318) BP: (130-165)/(67-93) 130/67 mmHg (03/30 1318) SpO2:  [96 %-99 %] 99 % (03/30 1318) Weight:  [122.018 kg (269 lb)] 122.018 kg (269 lb) (03/29 1654)  Intake/Output from previous day:  Intake/Output Summary (Last 24 hours) at 10/05/15 1550 Last data filed at 10/05/15 1318  Gross per 24 hour  Intake 3217.49 ml  Output   2745 ml  Net 472.49 ml    Intake/Output this shift: Total I/O In: 1059.2 [P.O.:600; I.V.:459.2] Out: 475 [Urine:475]  Labs:  Recent Labs  10/05/15 0352  HGB 10.8*    Recent Labs  10/05/15 0352  WBC 14.9*  RBC 3.15*  HCT 29.3*  PLT 307    Recent Labs  10/05/15 0352  NA 136  K 4.0  CL 101  CO2 26  BUN 9  CREATININE 0.78  GLUCOSE 176*  CALCIUM 8.9   No results for input(s): LABPT, INR in the last 72 hours.  EXAM General - Patient is Alert, Appropriate and Oriented Extremity - Neurovascular intact Sensation intact distally Dressing - dressing C/D/I Motor Function - intact, moving foot and toes well on exam.  Hemovac pulled without difficulty.  Past Medical History  Diagnosis Date  . Dyslipidemia   . Arthritis   . Hypogonadism male   . HTN (hypertension)   . Diabetes mellitus 2008    type II  . Erectile dysfunction   . History of cardiac catheterization 06/2013    Dr. Peter Sw[place]iland  . Coronary artery disease     nonobstructive per cath 12/14.  normal LV function, falsely abnormal myoview  stress test; Dr. Peter Sw[place]    Assessment/Plan: 1 Day Post-Op Procedure(s) (LRB): LEFT TOTAL KNEE ARTHROPLASTY right knee aspiration and injection (Left) Principal Problem:   OA (osteoarthritis) of knee  Estimated body mass index is 32.76 kg/(m^2) as calculated from the following:   Height as of this encounter: 6\' 4"  (1.93 m).   Weight as of this encounter: 122.018 kg (269 lb). Up with therapy Plan for discharge tomorrow Discharge home with home health  DVT Prophylaxis - Xarelto Weight-Bearing as tolerated to left leg D/C O2 and Pulse OX and try on Room Air  , PA-C Orthopaedic Surgery 10/05/2015, 3:50 PM

## 2015-10-05 NOTE — Care Management Note (Signed)
Case Management Note  Patient Details  Name: MARCELLINO FIDALGO MRN: [id number] Date of Birth: 11-Nov-1955  Subjective/Objective:                  Left Total Knee Arthroplasty Action/Plan: Discharge planning Expected Discharge Date:                  Expected Discharge Plan:  [place]  In-House Referral:     Discharge planning Services  CM Consult  Post Acute Care Choice:  Home Health Choice offered to:  Patient  DME Arranged:  N/A DME Agency:  NA  HH Arranged:  PT HH Agency:  [facility]  Status of Service:  Completed, signed off  Medicare Important Message Given:    Date Medicare IM Given:    Medicare IM give by:    Date Additional Medicare IM Given:    Additional Medicare Important Message give by:     If discussed at [place] of Stay Meetings, dates discussed:    Additional Comments: CM met with pt who states he is going home with wife.  Pt states he has all the DME he needs at home.  Cm offered choice of home health agency.  Pt chooses [facility] to render HHPT.  Referral called to [employer], Tim.  No other CM needs were communicated. Dellie Catholic, RN 10/05/2015, 2:37 PM

## 2015-10-05 NOTE — Discharge Instructions (Addendum)
° °Dr. Frank Aluisio °Total Joint Specialist °[facility] °[address] °[place], [place] [address] °[phone] ° °TOTAL KNEE REPLACEMENT POSTOPERATIVE DIRECTIONS ° °Knee Rehabilitation, Guidelines Following Surgery  °Results after knee surgery are often greatly improved when you follow the exercise, range of motion and muscle strengthening exercises prescribed by your doctor. Safety measures are also important to protect the knee from further injury. Any time any of these exercises cause you to have increased pain or swelling in your knee joint, decrease the amount until you are comfortable again and slowly increase them. If you have problems or questions, call your caregiver or physical therapist for advice.  ° °HOME CARE INSTRUCTIONS  °Remove items at home which could result in a fall. This includes throw rugs or furniture in walking pathways.  °· ICE to the affected knee every three hours for 30 minutes at a time and then as needed for pain and swelling.  Continue to use ice on the knee for pain and swelling from surgery. You may notice swelling that will progress down to the foot and ankle.  This is normal after surgery.  Elevate the leg when you are not up walking on it.   °· Continue to use the breathing machine which will help keep your temperature down.  It is common for your temperature to cycle up and down following surgery, especially at night when you are not up moving around and exerting yourself.  The breathing machine keeps your lungs expanded and your temperature down. °· Do not place pillow under knee, focus on keeping the knee straight while resting ° °DIET °You may resume your previous home diet once your are discharged from the hospital. ° °DRESSING / WOUND CARE / SHOWERING °You may shower 3 days after surgery, but keep the wounds dry during showering.  You may use an occlusive plastic wrap (Press'n Seal for example), NO SOAKING/SUBMERGING IN THE BATHTUB.  If the  bandage gets wet, change with a clean dry gauze.  If the incision gets wet, pat the wound dry with a clean towel. °You may start showering once you are discharged home but do not submerge the incision under water. Just pat the incision dry and apply a dry gauze dressing on daily. °Change the surgical dressing daily and reapply a dry dressing each time. ° °ACTIVITY °Walk with your walker as instructed. °Use walker as long as suggested by your caregivers. °Avoid periods of inactivity such as sitting longer than an hour when not asleep. This helps prevent blood clots.  °You may resume a sexual relationship in one month or when given the OK by your doctor.  °You may return to work once you are cleared by your doctor.  °Do not drive a car for 6 weeks or until released by you surgeon.  °Do not drive while taking narcotics. ° °WEIGHT BEARING °Weight bearing as tolerated with assist device (walker, cane, etc) as directed, use it as long as suggested by your surgeon or therapist, typically at least 4-6 weeks. ° °POSTOPERATIVE CONSTIPATION PROTOCOL °Constipation - defined medically as fewer than three stools per week and severe constipation as less than one stool per week. ° °One of the most common issues patients have following surgery is constipation.  Even if you have a regular bowel pattern at home, your normal regimen is likely to be disrupted due to multiple reasons following surgery.  Combination of anesthesia, postoperative narcotics, change in appetite and fluid intake all can affect your bowels.    In order to avoid complications following surgery, here are some recommendations in order to help you during your recovery period. ° °Colace (docusate) - Pick up an over-the-counter form of Colace or another stool softener and take twice a day as long as you are requiring postoperative pain medications.  Take with a full glass of water daily.  If you experience loose stools or diarrhea, hold the colace until you stool forms  back up.  If your symptoms do not get better within 1 week or if they get worse, check with your doctor. ° °Dulcolax (bisacodyl) - Pick up over-the-counter and take as directed by the product packaging as needed to assist with the movement of your bowels.  Take with a full glass of water.  Use this product as needed if not relieved by Colace only.  ° °MiraLax (polyethylene glycol) - Pick up over-the-counter to have on hand.  MiraLax is a solution that will increase the amount of water in your bowels to assist with bowel movements.  Take as directed and can mix with a glass of water, juice, soda, coffee, or tea.  Take if you go more than two days without a movement. °Do not use MiraLax more than once per day. Call your doctor if you are still constipated or irregular after using this medication for 7 days in a row. ° °If you continue to have problems with postoperative constipation, please contact the office for further assistance and recommendations.  If you experience "the worst abdominal pain ever" or develop nausea or vomiting, please contact the office immediatly for further recommendations for treatment. ° °ITCHING ° If you experience itching with your medications, try taking only a single pain pill, or even half a pain pill at a time.  You can also use Benadryl over the counter for itching or also to help with sleep.  ° °TED HOSE STOCKINGS °Wear the elastic stockings on both legs for three weeks following surgery during the day but you may remove then at night for sleeping. ° °MEDICATIONS °See your medication summary on the “After Visit Summary” that the nursing staff will review with you prior to discharge.  You may have some home medications which will be placed on hold until you complete the course of blood thinner medication.  It is important for you to complete the blood thinner medication as prescribed by your surgeon.  Continue your approved medications as instructed at time of  discharge. ° °PRECAUTIONS °If you experience chest pain or shortness of breath - call 911 immediately for transfer to the hospital emergency department.  °If you develop a fever greater that 101 F, purulent drainage from wound, increased redness or drainage from wound, foul odor from the wound/dressing, or calf pain - CONTACT YOUR SURGEON.   °                                                °FOLLOW-UP APPOINTMENTS °Make sure you keep all of your appointments after your operation with your surgeon and caregivers. You should call the office at the above phone number and make an appointment for approximately two weeks after the date of your surgery or on the date instructed by your surgeon outlined in the "After Visit Summary". ° ° °RANGE OF MOTION AND STRENGTHENING EXERCISES  °Rehabilitation of the knee is important following a knee injury or   an operation. After just a few days of immobilization, the muscles of the thigh which control the knee become weakened and shrink (atrophy). Knee exercises are designed to build up the tone and strength of the thigh muscles and to improve knee motion. Often times heat used for twenty to thirty minutes before working out will loosen up your tissues and help with improving the range of motion but do not use heat for the first two weeks following surgery. These exercises can be done on a training (exercise) mat, on the floor, on a table or on a bed. Use what ever works the best and is most comfortable for you Knee exercises include:  °Leg Lifts - While your knee is still immobilized in a splint or cast, you can do straight leg raises. Lift the leg to 60 degrees, hold for 3 sec, and slowly lower the leg. Repeat 10-20 times 2-3 times daily. Perform this exercise against resistance later as your knee gets better.  °Quad and Hamstring Sets - Tighten up the muscle on the front of the thigh (Quad) and hold for 5-10 sec. Repeat this 10-20 times hourly. Hamstring sets are done by pushing the  foot backward against an object and holding for 5-10 sec. Repeat as with quad sets.  °· Leg Slides: Lying on your back, slowly slide your foot toward your buttocks, bending your knee up off the floor (only go as far as is comfortable). Then slowly slide your foot back down until your leg is flat on the floor again. °· Angel Wings: Lying on your back spread your legs to the side as far apart as you can without causing discomfort.  °A rehabilitation program following serious knee injuries can speed recovery and prevent re-injury in the future due to weakened muscles. Contact your doctor or a physical therapist for more information on knee rehabilitation.  ° °IF YOU ARE TRANSFERRED TO A SKILLED REHAB FACILITY °If the patient is transferred to a skilled rehab facility following release from the hospital, a list of the current medications will be sent to the facility for the patient to continue.  When discharged from the skilled rehab facility, please have the facility set up the patient's Home Health Physical Therapy prior to being released. Also, the skilled facility will be responsible for providing the patient with their medications at time of release from the facility to include their pain medication, the muscle relaxants, and their blood thinner medication. If the patient is still at the rehab facility at time of the two week follow up appointment, the skilled rehab facility will also need to assist the patient in arranging follow up appointment in our office and any transportation needs. ° °MAKE SURE YOU:  °Understand these instructions.  °Get help right away if you are not doing well or get worse.  ° ° °Pick up stool softner and laxative for home use following surgery while on pain medications. °Do not submerge incision under water. °Please use good hand washing techniques while changing dressing each day. °May shower starting three days after surgery. °Please use a clean towel to pat the incision dry following  showers. °Continue to use ice for pain and swelling after surgery. °Do not use any lotions or creams on the incision until instructed by your surgeon. ° °Take Xarelto for two and a half more weeks, then discontinue Xarelto. °Once the patient has completed the Xarelto, they may resume the 81 mg Aspirin. ° ° °Information on my medicine - XARELTO® (Rivaroxaban) ° °  This medication education was reviewed with me or my healthcare representative as part of my discharge preparation.  The pharmacist that spoke with me during my hospital stay was:  Clance Boll, [facility]  Why was Xarelto prescribed for you? Xarelto was prescribed for you to reduce the risk of blood clots forming after orthopedic surgery. The medical term for these abnormal blood clots is venous thromboembolism (VTE).  What do you need to know about xarelto ? Take your Xarelto ONCE DAILY at the same time every day. You may take it either with or without food.  If you have difficulty swallowing the tablet whole, you may crush it and mix in applesauce just prior to taking your dose.  Take Xarelto exactly as prescribed by your doctor and DO NOT stop taking Xarelto without talking to the doctor who prescribed the medication.  Stopping without other VTE prevention medication to take the place of Xarelto may increase your risk of developing a clot.  After discharge, you should have regular check-up appointments with your healthcare provider that is prescribing your Xarelto.    What do you do if you miss a dose? If you miss a dose, take it as soon as you remember on the same day then continue your regularly scheduled once daily regimen the next day. Do not take two doses of Xarelto on the same day.   Important Safety Information A possible side effect of Xarelto is bleeding. You should call your healthcare provider right away if you experience any of the following: ? Bleeding from an injury or your nose that does not stop. ? Unusual  colored urine (red or dark brown) or unusual colored stools (red or black). ? Unusual bruising for unknown reasons. ? A serious fall or if you hit your head (even if there is no bleeding).  Some medicines may interact with Xarelto and might increase your risk of bleeding while on Xarelto. To help avoid this, consult your healthcare provider or pharmacist prior to using any new prescription or non-prescription medications, including herbals, vitamins, non-steroidal anti-inflammatory drugs (NSAIDs) and supplements.  This website has more information on Xarelto: [email or web address].com.br.    Knee Injection, Care After Refer to this sheet in the next few weeks. These instructions provide you with information about caring for yourself after your procedure. Your health care provider may also give you more specific instructions. Your treatment has been planned according to current medical practices, but problems sometimes occur. Call your health care provider if you have any problems or questions after your procedure. WHAT TO EXPECT AFTER THE PROCEDURE After your procedure, it is common to have:  Soreness.  Warmth.  Swelling. You may have more pain, swelling, and warmth than you did before the injection. This reaction may last for about one day.  HOME CARE INSTRUCTIONS Bathing  If you were given a bandage (dressing), keep it dry until your health care provider says it can be removed. Ask your health care provider when you can start showering or taking a bath. Managing Pain, Stiffness, and Swelling  If directed, apply ice to the injection area:  Put ice in a plastic bag.  Place a towel between your skin and the bag.  Leave the ice on for 20 minutes, 2-3 times per day.  Do not apply heat to your knee.  Raise the injection area above the level of your heart while you are sitting or lying down. Activity  Avoid strenuous activities for as long as directed by your health care  provider. Ask your  health care provider when you can return to your normal activities. General Instructions  Take medicines only as directed by your health care provider.  Check your injection site every day for signs of infection. Watch for:  Redness, swelling, or pain.  Fluid, blood, or pus.  Follow your health care provider's instructions about dressing changes and removal. SEEK MEDICAL CARE IF:  You have symptoms at your injection site that last longer than two days after your procedure.  You have redness, swelling, or pain in your injection area.  You have fluid, blood, or pus coming from your injection site.  You have warmth in your injection area.  You have a fever.  Your pain is not controlled with medicine. SEEK IMMEDIATE MEDICAL CARE IF:  Your knee turns very red.  Your knee becomes very swollen.  Your knee pain is severe.   This information is not intended to replace advice given to you by your health care provider. Make sure you discuss any questions you have with your health care provider.   Document Released: 07/15/2014 Document Reviewed: 07/15/2014 Elsevier Interactive Patient Education [employer]

## 2015-10-06 LAB — CBC
HEMATOCRIT: 27.5 % — AB (ref 39.0–52.0)
Hemoglobin: 9.9 g/dL — ABNORMAL LOW (ref 13.0–17.0)
MCH: 34.1 pg — ABNORMAL HIGH (ref 26.0–34.0)
MCHC: 36 g/dL (ref 30.0–36.0)
MCV: 94.8 fL (ref 78.0–100.0)
PLATELETS: 258 10*3/uL (ref 150–400)
RBC: 2.9 MIL/uL — AB (ref 4.22–5.81)
RDW: 12.7 % (ref 11.5–15.5)
WBC: 10 10*3/uL (ref 4.0–10.5)

## 2015-10-06 LAB — BASIC METABOLIC PANEL
ANION GAP: 8 (ref 5–15)
BUN: 7 mg/dL (ref 6–20)
CALCIUM: 8.9 mg/dL (ref 8.9–10.3)
CO2: 28 mmol/L (ref 22–32)
Chloride: 101 mmol/L (ref 101–111)
Creatinine, Ser: 0.57 mg/dL — ABNORMAL LOW (ref 0.61–1.24)
GLUCOSE: 158 mg/dL — AB (ref 65–99)
POTASSIUM: 3.7 mmol/L (ref 3.5–5.1)
Sodium: 137 mmol/L (ref 135–145)

## 2015-10-06 LAB — GLUCOSE, CAPILLARY
GLUCOSE-CAPILLARY: 141 mg/dL — AB (ref 65–99)
Glucose-Capillary: 154 mg/dL — ABNORMAL HIGH (ref 65–99)

## 2015-10-06 MED ORDER — OXYCODONE HCL 10 MG PO TABS: 10.0000 mg | ORAL_TABLET | ORAL | Status: DC | PRN

## 2015-10-06 MED ORDER — TRAMADOL HCL 50 MG PO TABS: 50.0000 mg | ORAL_TABLET | Freq: Four times a day (QID) | ORAL | Status: DC | PRN

## 2015-10-06 MED ORDER — RIVAROXABAN 10 MG PO TABS: 10.0000 mg | ORAL_TABLET | Freq: Every day | ORAL | Status: DC

## 2015-10-06 MED ORDER — METHOCARBAMOL 500 MG PO TABS: 500.0000 mg | ORAL_TABLET | Freq: Four times a day (QID) | ORAL | Status: DC | PRN

## 2015-10-06 NOTE — Progress Notes (Signed)
Physical Therapy Treatment Patient Details Name: Jerry [name] MRN: [id number] DOB: 1955/07/15 Today's Date: 10/06/2015    History of Present Illness L TKA, fluid drawn from R and received a cortisone shot to R knee    PT Comments    The  L leg and thigh are more edematous and tender. Reviewed /instructed patient mand placed in elevation for measures to reduce edema. Ready for DC to day.  Follow Up Recommendations  Home health PT;Supervision/Assistance - 24 hour     Equipment Recommendations       Recommendations for Other Services       Precautions / Restrictions Precautions Precautions: Knee Required Braces or Orthoses: Knee Immobilizer - Left Knee Immobilizer - Left: Discontinue once straight leg raise with < 10 degree lag    Mobility  Bed Mobility Overal bed mobility: Needs Assistance Bed Mobility: Supine to Sit;Sit to Supine     Supine to sit: Min assist Sit to supine: Min assist   General bed mobility comments: assist with L leg  Transfers Overall transfer level: Needs assistance Equipment used: Rolling walker (2 wheeled) Transfers: Sit to/from Stand Sit to Stand: Supervision;From elevated surface            Ambulation/Gait Ambulation/Gait assistance: Min guard Ambulation Distance (Feet): 40 Feet Assistive device: Rolling walker (2 wheeled) Gait Pattern/deviations: Step-to pattern;Antalgic     General Gait Details: cues for sequence, gait is more labored today due to c/o tightness and tenderness of the L leg   Stairs Stairs: Yes Stairs assistance: Min guard Stair Management: Two rails;Step to pattern Number of Stairs: 2 General stair comments: cues for sequence and safety.  Wheelchair Mobility    Modified Rankin (Stroke Patients Only)       Balance                                    Cognition Arousal/Alertness: Awake/alert                          Exercises Total Joint Exercises Ankle Circles/Pumps:  AROM;Both;10 reps;Supine Quad Sets: AROM;Both;10 reps;Supine Towel Squeeze: AROM;Both;10 reps Heel Slides: AAROM;Left;10 reps;Supine Hip ABduction/ADduction: AAROM;Left;10 reps;Supine Straight Leg Raises: AAROM;Left;10 reps;Supine Goniometric ROM: 10-45 l knee    General Comments        Pertinent Vitals/Pain Pain Score: 6  Pain Location: L knee Pain Descriptors / Indicators: Aching;Tightness;Squeezing;Sore Pain Intervention(s): Limited activity within patient's tolerance;Monitored during session;Patient requesting pain meds-RN notified;Repositioned;Ice applied    Home Living                      Prior Function            PT Goals (current goals can now be found in the care plan section) Progress towards PT goals: Progressing toward goals    Frequency       PT Plan Current plan remains appropriate    Co-evaluation             End of Session Equipment Utilized During Treatment: Left knee immobilizer Activity Tolerance: Patient tolerated treatment well Patient left: in bed;with call bell/phone within reach;with bed alarm set     Time: 1018-1101 PT Time Calculation (min) (ACUTE ONLY): 43 min  Charges:  $Gait Training: 8-22 mins $Therapeutic Exercise: 8-22 mins $Self Care/Home Management: 8-22  G Codes:      Sharen Heck PT [phone]   10/06/2015, 11:25 AM

## 2015-10-06 NOTE — Discharge Summary (Signed)
Physician Discharge Summary   Patient ID: Jerry [name] MRN: [id number] DOB/AGE: Apr 14, 1956 60 y.o.  Admit date: 10/04/2015 Discharge date: 10-06-2015  Primary Diagnosis:  Osteoarthritis Left knee(s) Osteoarthritis right knee  Admission Diagnoses:  Past Medical History  Diagnosis Date  . Dyslipidemia   . Arthritis   . Hypogonadism male   . HTN (hypertension)   . Diabetes mellitus 2008    type II  . Erectile dysfunction   . History of cardiac catheterization 06/2013    Dr. Peter Martinique  . Coronary artery disease     nonobstructive per cath 12/14.  normal LV function, falsely abnormal myoview stress test; Dr. Peter Martinique   Discharge Diagnoses:   Principal Problem:   OA (osteoarthritis) of knee  Estimated body mass index is 32.76 kg/(m^2) as calculated from the following:   Height as of this encounter: _0  (1.93 m).   Weight as of this encounter: 122.018 kg (269 lb).  Procedure:  Procedure(s) (LRB): LEFT TOTAL KNEE ARTHROPLASTY right knee aspiration and injection (Left)   Consults: None  HPI: Jerry [name] is a 60 y.o. year old male with end stage OA of his left knee with progressively worsening pain and dysfunction. He has constant pain, with activity and at rest and significant functional deficits with difficulties even with ADLs. He has had extensive non-op management including analgesics, injections of cortisone and viscosupplements, and home exercise program, but remains in significant pain with significant dysfunction. Radiographs show bone on bone arthritis medial. He presents now for left Total Knee Arthroplasty. He also has osteoarthritis of his right knee with a large effusion and requests aspiration and cortisone injection.  Laboratory Data: Admission on 10/04/2015  Component Date Value Ref Range Status  . Glucose-Capillary 10/04/2015 154* 65 - 99 mg/dL Final  . Comment 1 10/04/2015 Notify RN   Final  . Glucose-Capillary  10/04/2015 124* 65 - 99 mg/dL Final  . WBC 10/05/2015 14.9* 4.0 - 10.5 K/uL Final   WHITE COUNT CONFIRMED ON SMEAR  . RBC 10/05/2015 3.15* 4.22 - 5.81 MIL/uL Final  . Hemoglobin 10/05/2015 10.8* 13.0 - 17.0 g/dL Final  . HCT 10/05/2015 29.3* 39.0 - 52.0 % Final  . MCV 10/05/2015 93.0  78.0 - 100.0 fL Final  . MCH 10/05/2015 34.3* 26.0 - 34.0 pg Final  . MCHC 10/05/2015 36.9* 30.0 - 36.0 g/dL Final  . RDW 10/05/2015 12.5  11.5 - 15.5 % Final  . Platelets 10/05/2015 307  150 - 400 K/uL Final  . Sodium 10/05/2015 136  135 - 145 mmol/L Final  . Potassium 10/05/2015 4.0  3.5 - 5.1 mmol/L Final  . Chloride 10/05/2015 101  101 - 111 mmol/L Final  . CO2 10/05/2015 26  22 - 32 mmol/L Final  . Glucose, Bld 10/05/2015 176* 65 - 99 mg/dL Final  . BUN 10/05/2015 9  6 - 20 mg/dL Final  . Creatinine, Ser 10/05/2015 0.78  0.61 - 1.24 mg/dL Final  . Calcium 10/05/2015 8.9  8.9 - 10.3 mg/dL Final  . GFR calc non Af Amer 10/05/2015 >60  >60 mL/min Final  . GFR calc Af Amer 10/05/2015 >60  >60 mL/min Final   Comment: (NOTE) The eGFR has been calculated using the CKD EPI equation. This calculation has not been validated in all clinical situations. eGFR's persistently <60 mL/min signify possible Chronic Kidney Disease.   . Anion gap 10/05/2015 9  5 - 15 Final  . Glucose-Capillary 10/04/2015 295* 65 - 99 mg/dL Final  . Glucose-Capillary  10/04/2015 333* 65 - 99 mg/dL Final  . Glucose-Capillary 10/05/2015 190* 65 - 99 mg/dL Final  . Glucose-Capillary 10/05/2015 221* 65 - 99 mg/dL Final  . WBC 10/06/2015 10.0  4.0 - 10.5 K/uL Final  . RBC 10/06/2015 2.90* 4.22 - 5.81 MIL/uL Final  . Hemoglobin 10/06/2015 9.9* 13.0 - 17.0 g/dL Final  . HCT 10/06/2015 27.5* 39.0 - 52.0 % Final  . MCV 10/06/2015 94.8  78.0 - 100.0 fL Final  . MCH 10/06/2015 34.1* 26.0 - 34.0 pg Final  . MCHC 10/06/2015 36.0  30.0 - 36.0 g/dL Final  . RDW 10/06/2015 12.7  11.5 - 15.5 % Final  . Platelets 10/06/2015 258  150 - 400 K/uL  Final  . Sodium 10/06/2015 137  135 - 145 mmol/L Final  . Potassium 10/06/2015 3.7  3.5 - 5.1 mmol/L Final  . Chloride 10/06/2015 101  101 - 111 mmol/L Final  . CO2 10/06/2015 28  22 - 32 mmol/L Final  . Glucose, Bld 10/06/2015 158* 65 - 99 mg/dL Final  . BUN 10/06/2015 7  6 - 20 mg/dL Final  . Creatinine, Ser 10/06/2015 0.57* 0.61 - 1.24 mg/dL Final  . Calcium 10/06/2015 8.9  8.9 - 10.3 mg/dL Final  . GFR calc non Af Amer 10/06/2015 >60  >60 mL/min Final  . GFR calc Af Amer 10/06/2015 >60  >60 mL/min Final   Comment: (NOTE) The eGFR has been calculated using the CKD EPI equation. This calculation has not been validated in all clinical situations. eGFR's persistently <60 mL/min signify possible Chronic Kidney Disease.   . Anion gap 10/06/2015 8  5 - 15 Final  . Glucose-Capillary 10/05/2015 211* 65 - 99 mg/dL Final  . Glucose-Capillary 10/05/2015 169* 65 - 99 mg/dL Final  . Glucose-Capillary 10/06/2015 154* 65 - 99 mg/dL Final  Hospital Outpatient Visit on 09/28/2015  Component Date Value Ref Range Status  . MRSA, PCR 09/28/2015 NEGATIVE  NEGATIVE Final  . Staphylococcus aureus 09/28/2015 POSITIVE* NEGATIVE Final   Comment:        The Xpert SA Assay (FDA approved for NASAL specimens in patients over 23 years of age), is one component of a comprehensive surveillance program.  Test performance has been validated by [facility] for patients greater than or equal to 69 year old. It is not intended to diagnose infection nor to guide or monitor treatment.   Marland Kitchen aPTT 09/28/2015 35  24 - 37 seconds Final  . WBC 09/28/2015 5.0  4.0 - 10.5 K/uL Final  . RBC 09/28/2015 3.78* 4.22 - 5.81 MIL/uL Final  . Hemoglobin 09/28/2015 12.8* 13.0 - 17.0 g/dL Final  . HCT 09/28/2015 35.2* 39.0 - 52.0 % Final  . MCV 09/28/2015 93.1  78.0 - 100.0 fL Final  . MCH 09/28/2015 33.9  26.0 - 34.0 pg Final  . MCHC 09/28/2015 36.4* 30.0 - 36.0 g/dL Final  . RDW 09/28/2015 12.2  11.5 - 15.5 % Final  .  Platelets 09/28/2015 198  150 - 400 K/uL Final  . Sodium 09/28/2015 141  135 - 145 mmol/L Final  . Potassium 09/28/2015 4.1  3.5 - 5.1 mmol/L Final  . Chloride 09/28/2015 105  101 - 111 mmol/L Final  . CO2 09/28/2015 26  22 - 32 mmol/L Final  . Glucose, Bld 09/28/2015 171* 65 - 99 mg/dL Final  . BUN 09/28/2015 10  6 - 20 mg/dL Final  . Creatinine, Ser 09/28/2015 0.72  0.61 - 1.24 mg/dL Final  . Calcium 09/28/2015 9.3  8.9 -  10.3 mg/dL Final  . Total Protein 09/28/2015 7.2  6.5 - 8.1 g/dL Final  . Albumin 09/28/2015 4.2  3.5 - 5.0 g/dL Final  . AST 09/28/2015 19  15 - 41 U/L Final  . ALT 09/28/2015 17  17 - 63 U/L Final  . Alkaline Phosphatase 09/28/2015 49  38 - 126 U/L Final  . Total Bilirubin 09/28/2015 0.8  0.3 - 1.2 mg/dL Final  . GFR calc non Af Amer 09/28/2015 >60  >60 mL/min Final  . GFR calc Af Amer 09/28/2015 >60  >60 mL/min Final   Comment: (NOTE) The eGFR has been calculated using the CKD EPI equation. This calculation has not been validated in all clinical situations. eGFR's persistently <60 mL/min signify possible Chronic Kidney Disease.   . Anion gap 09/28/2015 10  5 - 15 Final  . Prothrombin Time 09/28/2015 13.7  11.6 - 15.2 seconds Final  . INR 09/28/2015 1.07  0.00 - 1.49 Final  . ABO/RH(D) 09/28/2015 A POS   Final  . Antibody Screen 09/28/2015 NEG   Final  . Sample Expiration 09/28/2015 10/07/2015   Final  . Extend sample reason 09/28/2015 NO TRANSFUSIONS OR PREGNANCY IN THE PAST 3 MONTHS   Final  . Color, Urine 09/28/2015 YELLOW  YELLOW Final  . APPearance 09/28/2015 CLEAR  CLEAR Final  . Specific Gravity, Urine 09/28/2015 1.031* 1.005 - 1.030 Final  . pH 09/28/2015 5.5  5.0 - 8.0 Final  . Glucose, UA 09/28/2015 500* NEGATIVE mg/dL Final  . Hgb urine dipstick 09/28/2015 NEGATIVE  NEGATIVE Final  . Bilirubin Urine 09/28/2015 NEGATIVE  NEGATIVE Final  . Ketones, ur 09/28/2015 NEGATIVE  NEGATIVE mg/dL Final  . Protein, ur 09/28/2015 NEGATIVE  NEGATIVE mg/dL Final   . Nitrite 09/28/2015 NEGATIVE  NEGATIVE Final  . Leukocytes, UA 09/28/2015 NEGATIVE  NEGATIVE Final   MICROSCOPIC NOT DONE ON URINES WITH NEGATIVE PROTEIN, BLOOD, LEUKOCYTES, NITRITE, OR GLUCOSE <1000 mg/dL.  . ABO/RH(D) 09/28/2015 A POS   Final  . Hgb A1c MFr Bld 09/28/2015 6.2* 4.8 - 5.6 % Final   Comment: (NOTE)         Pre-diabetes: 5.7 - 6.4         Diabetes: >6.4         Glycemic control for adults with diabetes: <7.0   . Mean Plasma Glucose 09/28/2015 131   Final   Comment: (NOTE) Performed At: [facility] [place], [place] [id number] Lindon Romp MD KC:[id number]      X-Rays:No results found.  EKG: Orders placed or performed in visit on 08/03/15  . EKG 12-Lead     Hospital Course: Jerry [name] is a 60 y.o. who was admitted to [facility]. They were brought to the operating room on 10/04/2015 and underwent Procedure(s): LEFT TOTAL KNEE ARTHROPLASTY right knee aspiration and injection.  Patient tolerated the procedure well and was later transferred to the recovery room and then to the orthopaedic floor for postoperative care.  They were given PO and IV analgesics for pain control following their surgery.  They were given 24 hours of postoperative antibiotics of  Anti-infectives    Start     Dose/Rate Route Frequency Ordered Stop   10/04/15 1800  ceFAZolin (ANCEF) IVPB 2g/100 mL premix     2 g 200 mL/hr over 30 Minutes Intravenous Every 6 hours 10/04/15 1620 10/04/15 2343   10/04/15 0600  ceFAZolin (ANCEF) 3 g in dextrose 5 % 50 mL IVPB     3  g 130 mL/hr over 30 Minutes Intravenous On call to O.R. 10/03/15 1345 10/04/15 1213     and started on DVT prophylaxis in the form of Xarelto.   PT and OT were ordered for total joint protocol.  Discharge planning consulted to help with postop disposition and equipment needs.  Patient had a tough night on the evening of surgery with pain.  They started to get up OOB with therapy on day one. Hemovac  drain was pulled without difficulty.  Continued to work with therapy into day two.  Dressing was changed on day two and the incision was healing well. Patient was seen in rounds by Dr. Wynelle Link and was ready to go home.  Discharge home with home health Diet - Cardiac diet and Diabetic diet Follow up - in 2 weeks Activity - WBAT Disposition - Home Condition Upon Discharge - Good D/C Meds - See DC Summary DVT Prophylaxis - Xarelto  Discharge Instructions    Call MD / Call 911    Complete by:  As directed   If you experience chest pain or shortness of breath, CALL 911 and be transported to the hospital emergency room.  If you develope a fever above 101 F, pus (white drainage) or increased drainage or redness at the wound, or calf pain, call your surgeon's office.     Change dressing    Complete by:  As directed   Change dressing daily with sterile 4 x 4 inch gauze dressing and apply TED hose. Do not submerge the incision under water.     Constipation Prevention    Complete by:  As directed   Drink plenty of fluids.  Prune juice may be helpful.  You may use a stool softener, such as Colace (over the counter) 100 mg twice a day.  Use MiraLax (over the counter) for constipation as needed.     Diet - low sodium heart healthy    Complete by:  As directed      Diet Carb Modified    Complete by:  As directed      Discharge instructions    Complete by:  As directed   Pick up stool softner and laxative for home use following surgery while on pain medications. Do not submerge incision under water. Please use good hand washing techniques while changing dressing each day. May shower starting three days after surgery. Please use a clean towel to pat the incision dry following showers. Continue to use ice for pain and swelling after surgery. Do not use any lotions or creams on the incision until instructed by your surgeon.  Take Xarelto for two and a half more weeks, then discontinue Xarelto. Once  the patient has completed the Xarelto, they may resume the 81 mg Aspirin.  Postoperative Constipation Protocol  Constipation - defined medically as fewer than three stools per week and severe constipation as less than one stool per week.  One of the most common issues patients have following surgery is constipation.  Even if you have a regular bowel pattern at home, your normal regimen is likely to be disrupted due to multiple reasons following surgery.  Combination of anesthesia, postoperative narcotics, change in appetite and fluid intake all can affect your bowels.  In order to avoid complications following surgery, here are some recommendations in order to help you during your recovery period.  Colace (docusate) - Pick up an over-the-counter form of Colace or another stool softener and take twice a day as long as you  are requiring postoperative pain medications.  Take with a full glass of water daily.  If you experience loose stools or diarrhea, hold the colace until you stool forms back up.  If your symptoms do not get better within 1 week or if they get worse, check with your doctor.  Dulcolax (bisacodyl) - Pick up over-the-counter and take as directed by the product packaging as needed to assist with the movement of your bowels.  Take with a full glass of water.  Use this product as needed if not relieved by Colace only.   MiraLax (polyethylene glycol) - Pick up over-the-counter to have on hand.  MiraLax is a solution that will increase the amount of water in your bowels to assist with bowel movements.  Take as directed and can mix with a glass of water, juice, soda, coffee, or tea.  Take if you go more than two days without a movement. Do not use MiraLax more than once per day. Call your doctor if you are still constipated or irregular after using this medication for 7 days in a row.  If you continue to have problems with postoperative constipation, please contact the office for further  assistance and recommendations.  If you experience "the worst abdominal pain ever" or develop nausea or vomiting, please contact the office immediatly for further recommendations for treatment.     Do not put a pillow under the knee. Place it under the heel.    Complete by:  As directed      Do not sit on low chairs, stoools or toilet seats, as it may be difficult to get up from low surfaces    Complete by:  As directed      Driving restrictions    Complete by:  As directed   No driving until released by the physician.     Increase activity slowly as tolerated    Complete by:  As directed      Lifting restrictions    Complete by:  As directed   No lifting until released by the physician.     Patient may shower    Complete by:  As directed   You may shower without a dressing once there is no drainage.  Do not wash over the wound.  If drainage remains, do not shower until drainage stops.     TED hose    Complete by:  As directed   Use stockings (TED hose) for 3 weeks on both leg(s).  You may remove them at night for sleeping.     Weight bearing as tolerated    Complete by:  As directed   Laterality:  left  Extremity:  Lower            Medication List    STOP taking these medications        aspirin EC 81 MG tablet     oxyCODONE-acetaminophen 10-325 MG tablet  Commonly known as:  PERCOCET     tadalafil 20 MG tablet  Commonly known as:  CIALIS     vitamin B-12 500 MCG tablet  Commonly known as:  CYANOCOBALAMIN      TAKE these medications        acetaminophen 500 MG tablet  Commonly known as:  TYLENOL  Take 1,000 mg by mouth every 6 (six) hours as needed for mild pain.     amLODipine 5 MG tablet  Commonly known as:  NORVASC  TAKE ONE TABLET BY MOUTH ONCE DAILY  atorvastatin 80 MG tablet  Commonly known as:  LIPITOR  TAKE ONE TABLET BY MOUTH ONCE DAILY     carvedilol 25 MG tablet  Commonly known as:  COREG  TAKE ONE TABLET BY MOUTH TWICE DAILY     gabapentin  100 MG capsule  Commonly known as:  NEURONTIN  Take 1 capsule (100 mg total) by mouth 3 (three) times daily.     Insulin Glargine 100 UNIT/ML Solostar Pen  Commonly known as:  LANTUS SOLOSTAR  Inject 15 Units into the skin 2 (two) times daily.     losartan 100 MG tablet  Commonly known as:  COZAAR  Take 1 tablet (100 mg total) by mouth daily.     metFORMIN 1000 MG tablet  Commonly known as:  GLUCOPHAGE  Take 1 tablet (1,000 mg total) by mouth daily.     methocarbamol 500 MG tablet  Commonly known as:  ROBAXIN  Take 1 tablet (500 mg total) by mouth every 6 (six) hours as needed for muscle spasms.     Oxycodone HCl 10 MG Tabs  Take 1-2 tablets (10-20 mg total) by mouth every 3 (three) hours as needed for moderate pain or severe pain.     rivaroxaban 10 MG Tabs tablet  Commonly known as:  XARELTO  Take 1 tablet (10 mg total) by mouth daily with breakfast. Take Xarelto for two and a half more weeks, then discontinue Xarelto. Once the patient has completed the Xarelto, they may resume the 81 mg Aspirin.     terbinafine 1 % cream  Commonly known as:  LAMISIL AT  Apply 1 application topically 2 (two) times daily.     traMADol 50 MG tablet  Commonly known as:  ULTRAM  Take 1-2 tablets (50-100 mg total) by mouth every 6 (six) hours as needed (mild pain).           Follow-up Information    Follow up with [facility].   Why:  home health physical therapy   Contact information:   [place] [address] [phone]       Follow up with Gearlean Alf, MD. Schedule an appointment as soon as possible for a visit on 10/17/2015.   Specialty:  Orthopedic Surgery   Why:  Call office at [phone] to setup appointment on Tuesday 10/17/15 with Dr. Wynelle Link.   Contact information:   [address] [place] [address] [phone]       Signed: Arlee Muslim, PA-C Orthopaedic Surgery 10/06/2015, 8:56 AM

## 2015-10-06 NOTE — Progress Notes (Signed)
   Subjective: 2 Days Post-Op Procedure(s) (LRB): LEFT TOTAL KNEE ARTHROPLASTY right knee aspiration and injection (Left) Patient reports pain as moderate but progressing with therapy. Patient seen in rounds with Dr. Lequita Halt. Patient is well, but has had some minor complaints of pain in the knee, requiring pain medications Patient is ready to go home today.  Objective: Vital signs in last 24 hours: Temp:  [97.7 F (36.5 C)-98.4 F (36.9 C)] 97.7 F (36.5 C) (03/31 0433) Pulse Rate:  [77-90] 87 (03/31 0433) Resp:  [16-18] 16 (03/31 0433) BP: (130-172)/(67-90) 172/88 mmHg (03/31 0433) SpO2:  [98 %-100 %] 100 % (03/31 0433)  Intake/Output from previous day:  Intake/Output Summary (Last 24 hours) at 10/06/15 0847 Last data filed at 10/06/15 6754  Gross per 24 hour  Intake 1539.16 ml  Output   1350 ml  Net 189.16 ml     Labs:  Recent Labs  10/05/15 0352 10/06/15 0353  HGB 10.8* 9.9*    Recent Labs  10/05/15 0352 10/06/15 0353  WBC 14.9* 10.0  RBC 3.15* 2.90*  HCT 29.3* 27.5*  PLT 307 258    Recent Labs  10/05/15 0352 10/06/15 0353  NA 136 137  K 4.0 3.7  CL 101 101  CO2 26 28  BUN 9 7  CREATININE 0.78 0.57*  GLUCOSE 176* 158*  CALCIUM 8.9 8.9   No results for input(s): LABPT, INR in the last 72 hours.  EXAM: General - Patient is Alert, Appropriate and Oriented Extremity - Neurovascular intact Sensation intact distally Dorsiflexion/Plantar flexion intact Incision - clean, dry, no drainage Motor Function - intact, moving foot and toes well on exam.   Assessment/Plan: 2 Days Post-Op Procedure(s) (LRB): LEFT TOTAL KNEE ARTHROPLASTY right knee aspiration and injection (Left) Procedure(s) (LRB): LEFT TOTAL KNEE ARTHROPLASTY right knee aspiration and injection (Left) Past Medical History  Diagnosis Date  . Dyslipidemia   . Arthritis   . Hypogonadism male   . HTN (hypertension)   . Diabetes mellitus 2008    type II  . Erectile dysfunction   .  History of cardiac catheterization 06/2013    Dr. Peter [place]  . Coronary artery disease     nonobstructive per cath 12/14.  normal LV function, falsely abnormal myoview stress test; Dr. Peter [place]   Principal Problem:   OA (osteoarthritis) of knee  Estimated body mass index is 32.76 kg/(m^2) as calculated from the following:   Height as of this encounter: 6\' 4"  (1.93 m).   Weight as of this encounter: 122.018 kg (269 lb). Up with therapy Discharge home with home health Diet - Cardiac diet and Diabetic diet Follow up - in 2 weeks Activity - WBAT Disposition - Home Condition Upon Discharge - Good D/C Meds - See DC Summary DVT Prophylaxis - Xarelto  , PA-C Orthopaedic Surgery 10/06/2015, 8:47 AM

## 2015-10-06 NOTE — Progress Notes (Signed)
OT Cancellation Note  Patient Details Name: Jerry [name] MRN: [id number] DOB: [birth year]   Cancelled Treatment:    Reason Eval/Treat Not Completed: Other (comment).  Pt states he does not need OT.  He worked at extended care in the past.  He plans to get a tub bench for his tub.  Pt has a comfort height commode.  Initially, he said he could reach for towel rack to pull up.  Recommended that he use toilet seat and RW.  He did not want to practice this.  Will sign off.  [name] 10/06/2015, 10:09 AM  Marica Otter, OTR/L [phone] 10/06/2015

## 2015-10-06 NOTE — Progress Notes (Signed)
RN reviewed discharge instructions with patient and family. All questions answered.   Paperwork and prescriptions given.   NT rolled patient down in wheelchair with all belongings to family car.    

## 2015-11-22 ENCOUNTER — Other Ambulatory Visit: Payer: Self-pay | Admitting: Medical

## 2015-12-03 DIAGNOSIS — D539 Nutritional anemia, unspecified: Secondary | ICD-10-CM | POA: Insufficient documentation

## 2015-12-03 DIAGNOSIS — E669 Obesity, unspecified: Secondary | ICD-10-CM | POA: Insufficient documentation

## 2016-03-21 ENCOUNTER — Encounter: Payer: Self-pay | Admitting: Physical Medicine & Rehabilitation

## 2016-03-21 ENCOUNTER — Encounter: Payer: PPO | Attending: Physical Medicine & Rehabilitation

## 2016-03-21 ENCOUNTER — Ambulatory Visit (HOSPITAL_BASED_OUTPATIENT_CLINIC_OR_DEPARTMENT_OTHER): Payer: PPO | Admitting: Physical Medicine & Rehabilitation

## 2016-03-21 VITALS — BP 158/89 | HR 85 | Resp 14

## 2016-03-21 DIAGNOSIS — E669 Obesity, unspecified: Secondary | ICD-10-CM | POA: Insufficient documentation

## 2016-03-21 DIAGNOSIS — I10 Essential (primary) hypertension: Secondary | ICD-10-CM | POA: Diagnosis not present

## 2016-03-21 DIAGNOSIS — Z87891 Personal history of nicotine dependence: Secondary | ICD-10-CM | POA: Diagnosis not present

## 2016-03-21 DIAGNOSIS — I2510 Atherosclerotic heart disease of native coronary artery without angina pectoris: Secondary | ICD-10-CM | POA: Insufficient documentation

## 2016-03-21 DIAGNOSIS — M25522 Pain in left elbow: Secondary | ICD-10-CM | POA: Insufficient documentation

## 2016-03-21 DIAGNOSIS — N529 Male erectile dysfunction, unspecified: Secondary | ICD-10-CM | POA: Insufficient documentation

## 2016-03-21 DIAGNOSIS — Z96659 Presence of unspecified artificial knee joint: Secondary | ICD-10-CM | POA: Insufficient documentation

## 2016-03-21 DIAGNOSIS — Z96652 Presence of left artificial knee joint: Secondary | ICD-10-CM | POA: Diagnosis not present

## 2016-03-21 DIAGNOSIS — Z9889 Other specified postprocedural states: Secondary | ICD-10-CM | POA: Diagnosis not present

## 2016-03-21 DIAGNOSIS — G8928 Other chronic postprocedural pain: Secondary | ICD-10-CM | POA: Diagnosis not present

## 2016-03-21 DIAGNOSIS — M25562 Pain in left knee: Secondary | ICD-10-CM | POA: Diagnosis present

## 2016-03-21 DIAGNOSIS — M1711 Unilateral primary osteoarthritis, right knee: Secondary | ICD-10-CM | POA: Diagnosis not present

## 2016-03-21 DIAGNOSIS — M5442 Lumbago with sciatica, left side: Secondary | ICD-10-CM | POA: Diagnosis not present

## 2016-03-21 DIAGNOSIS — Z5181 Encounter for therapeutic drug level monitoring: Secondary | ICD-10-CM | POA: Diagnosis not present

## 2016-03-21 DIAGNOSIS — Z79899 Other long term (current) drug therapy: Secondary | ICD-10-CM

## 2016-03-21 DIAGNOSIS — G8929 Other chronic pain: Secondary | ICD-10-CM | POA: Diagnosis not present

## 2016-03-21 DIAGNOSIS — E785 Hyperlipidemia, unspecified: Secondary | ICD-10-CM | POA: Insufficient documentation

## 2016-03-21 MED ORDER — TRAMADOL HCL 50 MG PO TABS: 100.0000 mg | ORAL_TABLET | Freq: Two times a day (BID) | ORAL | 0 refills | Status: DC

## 2016-03-21 NOTE — Addendum Note (Signed)
Addended by: Angela Nevin D on: 03/21/2016 03:26 PM   Modules accepted: Orders

## 2016-03-21 NOTE — Progress Notes (Signed)
Subjective:    Patient ID: Jerry [name], male    DOB: [birth year], 60 y.o.   MRN: [id number]  HPI CC Left elbow and knee pain  2 month hx of post operative, for R CTS release, Right ulnar nerve release, and Right forearm lipoma removal RIght arm still gets numb at night, entire arm gets weak on intermittent basis.  Fingers get numb Some improvement with cold pack Prior hx of neck pain but denies current complaints.  Patient has had follow-up with orthopedic hand surgery, no postoperative complications  Patient has had follow-up with his orthopedist completed his knee surgery, he felt that his knee pain could be back related.  L TKR March 2017 Has continued pain with sitting.   Left lateral knee numbness.  Ice is helpful. Freq back pain , every other day.  Some pain with >96min car rides, lifting groceries.    Exercises in pool 3 times a week Pain Inventory Average Pain 7 Pain Right Now 5 My pain is aching  In the last 24 hours, has pain interfered with the following? General activity 6 Relation with others 2 Enjoyment of life 1 What TIME of day is your pain at its worst? daytime and night Sleep (in general) Poor  Pain is worse with: walking and standing Pain improves with: nothing Relief from Meds: 0  Mobility walk without assistance use a cane Do you have any goals in this area?  no  Function disabled: date disabled 2009  Neuro/Psych numbness spasms depression  Prior Studies Any changes since last visit?  no  Physicians involved in your care Any changes since last visit?  no Orthopedist Aluisio   Family History  Problem Relation Age of Onset  . Diabetes Mother   . Other Father     death by MVA  . Other Brother     died of MVA  . Stroke Neg Hx   . Heart disease Neg Hx   . Cancer Neg Hx   . Hypertension Neg Hx    Social History   Social History  . Marital status: Married    Spouse name: N/A  . Number of children: N/A  . Years of  education: N/A   Occupational History  . retired     prior physical therapist   Social History Main Topics  . Smoking status: Former Smoker    Types: Cigarettes    Quit date: 11/11/2001  . Smokeless tobacco: Never Used  . Alcohol use No  . Drug use: No  . Sexual activity: No   Other Topics Concern  . None   Social History Narrative   Married 22 years, 2 daughter and 1 son. Retired, used to work as a PT.  Exercises treadmill daily, some weight bearing exercise. Diet    Past Surgical History:  Procedure Laterality Date  . APPENDECTOMY    . COLONOSCOPY  08/2006   Dr. Russella Dar - repeat 2018  . INCISION AND DRAINAGE PERIRECTAL ABSCESS    . KNEE ARTHROSCOPY Left 12/28/2014   Procedure: ARTHROSCOPY LEFT KNEE WITH  MEDIAL MENSICUS DEBRIDEMENT;  Surgeon: Ollen Gross, MD;  Location: [facility];  Service: Orthopedics;  Laterality: Left;  . KNEE SURGERY     left knee - remote past  . LEFT HEART CATHETERIZATION WITH CORONARY ANGIOGRAM N/A 06/11/2013   Procedure: LEFT HEART CATHETERIZATION WITH CORONARY ANGIOGRAM;  Surgeon: Peter M Swaziland, MD;  Location: [facility] CATH LAB;  Service: Cardiovascular;  Laterality: N/A;  . right arm and elbow surgery    .  TONSILLECTOMY    . TOTAL KNEE ARTHROPLASTY Left 10/04/2015   Procedure: LEFT TOTAL KNEE ARTHROPLASTY right knee aspiration and injection;  Surgeon: Ollen Gross, MD;  Location: [facility];  Service: Orthopedics;  Laterality: Left;  . wisdom teeth extractions     Past Medical History:  Diagnosis Date  . Arthritis   . Coronary artery disease    nonobstructive per cath 12/14.  normal LV function, falsely abnormal myoview stress test; Dr. Peter [place]and  . Diabetes mellitus 2008   type II  . Dyslipidemia   . Erectile dysfunction   . History of cardiac catheterization 06/2013   Dr. Peter [place]  . HTN (hypertension)   . Hypogonadism male    BP (!) 158/89 (BP Location: Left Arm, Patient Position: Sitting, Cuff Size: Large)   Pulse 85   Resp 14   SpO2 96%     Opioid Risk Score:   Fall Risk Score:  `1  Depression screen PHQ 2/9  Depression screen PHQ 2/9 03/21/2016  Decreased Interest 1  Down, Depressed, Hopeless 2  PHQ - 2 Score 3  Altered sleeping 3  Tired, decreased energy 3  Change in appetite 1  Feeling bad or failure about yourself  2  Trouble concentrating 1  Moving slowly or fidgety/restless 2  Suicidal thoughts 0  PHQ-9 Score 15  Difficult doing work/chores Somewhat difficult    Review of Systems  Constitutional: Positive for appetite change and diaphoresis.  Respiratory: Positive for apnea and wheezing.   Cardiovascular: Positive for leg swelling.  All other systems reviewed and are negative.      Objective:   Physical Exam  Constitutional: He is oriented to person, place, and time. He appears well-developed and well-nourished.  HENT:  Head: Normocephalic and atraumatic.  Eyes: Conjunctivae and EOM are normal. Pupils are equal, round, and reactive to light.  Neck: Normal range of motion.  Cardiovascular: Normal rate, regular rhythm and normal heart sounds.   No murmur heard. Pulmonary/Chest: Effort normal and breath sounds normal. No respiratory distress. He has no wheezes.  Abdominal: Soft. Bowel sounds are normal. He exhibits distension. There is no tenderness.  Neurological: He is alert and oriented to person, place, and time. No sensory deficit. Gait normal.  Reflex Scores:      Tricep reflexes are 1+ on the right side and 1+ on the left side.      Bicep reflexes are 1+ on the right side and 1+ on the left side.      Brachioradialis reflexes are 1+ on the right side and 1+ on the left side.      Patellar reflexes are 1+ on the right side and 0 on the left side.      Achilles reflexes are 0 on the right side and 0 on the left side. Strength: 5/5 bilateral deltoid, biceps, triceps, grip, hip flexor, knee extensor, ankle dorsiflexor and plantar flexor. Sensation is intact. Light touch and pinprick bilateral C5,  C6, C7, C8, L2, L3, L4, L5, S1 dermatomal distribution  Psychiatric: He has a normal mood and affect. Judgment and thought content normal. His speech is delayed. He is slowed. Cognition and memory are normal.  Nursing note and vitals reviewed.  Right knee has full range of motion with flexion, extension. No medial lateral instability. Minimal effusion. No erythema. Mild tenderness to palpation, medial joint line. Left knee lacks 5 of extension and has flexion to 120. Mild effusion, mild medial joint line tenderness, numbness lateral to the incision line No  hypersensitivity to touch over the scar, left knee. Negative straight leg raising. Cervical spine has normal range of motion. Negative Spurling's. Lumbar spine 50% flexion, 50%, lateral bending 75%, extension     Assessment & Plan:  1. Chronic postoperative pain, left lower extremity. He had evidence of osteoarthritis at the left knee, but did not respond to a technically successful left total knee replacement. He has low back pain, which suggests the left lower extremity pain may indeed be radicular and therefore not responding to total knee replacement. We'll check MRI lumbar spine to further evaluate. He has some similar but less severe symptoms in the right lower extremity.  We'll restart tramadol 100 mg twice a day Believe there is a neurogenic component to this pain and if patient fails tramadol would add gabapentin or Lyrica and failing this would recommend Nucynta  2. Right elbow pain. Postoperative I believe that this should improve with time. He still has some paresthesias in the right upper extremity, which could be explained by ulnar and median neuropathies, although they do affect his upper arm and shoulder areas well. If these do not resolve with time. Would consider cervical MRI given his history of lumbar spondylosis and degenerative disc. He could certainly be developing some cervical stenosis

## 2016-03-30 LAB — TOXASSURE SELECT,+ANTIDEPR,UR

## 2016-04-10 ENCOUNTER — Ambulatory Visit
Admission: RE | Admit: 2016-04-10 | Discharge: 2016-04-10 | Disposition: A | Discharge status: Home / Self Care | Payer: PPO | Source: Ambulatory Visit | Attending: Physical Medicine & Rehabilitation | Admitting: Physical Medicine & Rehabilitation

## 2016-04-10 ENCOUNTER — Telehealth: Payer: Self-pay | Admitting: *Deleted

## 2016-04-10 DIAGNOSIS — M5442 Lumbago with sciatica, left side: Principal | ICD-10-CM

## 2016-04-10 DIAGNOSIS — G8929 Other chronic pain: Secondary | ICD-10-CM

## 2016-04-10 NOTE — Telephone Encounter (Signed)
Patient's UDS results came back from [facility].  Results were inconsistent.  Urine specimen collected on 03/21/2016 contained metabolites for alcohol and cocaine.  Please see lab report for details

## 2016-04-10 NOTE — Progress Notes (Signed)
Urine drug screen for this encounter is inconsistent for prescribed medication, urine sample contained metabolites for alcohol and cocaine, see lab report for details

## 2016-04-10 NOTE — Telephone Encounter (Signed)
Informed patient that we will not be able to use anything stronger than tramadol is a narcotic

## 2016-04-22 ENCOUNTER — Ambulatory Visit (HOSPITAL_BASED_OUTPATIENT_CLINIC_OR_DEPARTMENT_OTHER): Payer: Medicare HMO | Admitting: Physical Medicine & Rehabilitation

## 2016-04-22 ENCOUNTER — Encounter: Payer: Medicare HMO | Attending: Physical Medicine & Rehabilitation

## 2016-04-22 ENCOUNTER — Encounter: Payer: Self-pay | Admitting: Physical Medicine & Rehabilitation

## 2016-04-22 VITALS — BP 134/86 | HR 88 | Resp 16

## 2016-04-22 DIAGNOSIS — M25522 Pain in left elbow: Secondary | ICD-10-CM | POA: Diagnosis not present

## 2016-04-22 DIAGNOSIS — I10 Essential (primary) hypertension: Secondary | ICD-10-CM | POA: Insufficient documentation

## 2016-04-22 DIAGNOSIS — N529 Male erectile dysfunction, unspecified: Secondary | ICD-10-CM | POA: Diagnosis not present

## 2016-04-22 DIAGNOSIS — I2510 Atherosclerotic heart disease of native coronary artery without angina pectoris: Secondary | ICD-10-CM | POA: Insufficient documentation

## 2016-04-22 DIAGNOSIS — M1711 Unilateral primary osteoarthritis, right knee: Secondary | ICD-10-CM | POA: Diagnosis not present

## 2016-04-22 DIAGNOSIS — Z87891 Personal history of nicotine dependence: Secondary | ICD-10-CM | POA: Insufficient documentation

## 2016-04-22 DIAGNOSIS — M48062 Spinal stenosis, lumbar region with neurogenic claudication: Secondary | ICD-10-CM | POA: Diagnosis not present

## 2016-04-22 DIAGNOSIS — Z9889 Other specified postprocedural states: Secondary | ICD-10-CM | POA: Insufficient documentation

## 2016-04-22 DIAGNOSIS — E669 Obesity, unspecified: Secondary | ICD-10-CM | POA: Insufficient documentation

## 2016-04-22 DIAGNOSIS — E785 Hyperlipidemia, unspecified: Secondary | ICD-10-CM | POA: Insufficient documentation

## 2016-04-22 DIAGNOSIS — M25562 Pain in left knee: Secondary | ICD-10-CM | POA: Diagnosis present

## 2016-04-22 DIAGNOSIS — Z96652 Presence of left artificial knee joint: Secondary | ICD-10-CM

## 2016-04-22 MED ORDER — TRAMADOL HCL 50 MG PO TABS
100.0000 mg | ORAL_TABLET | Freq: Two times a day (BID) | ORAL | 0 refills | Status: DC
Start: 2016-04-22 — End: 2016-06-13

## 2016-04-22 MED ORDER — PREGABALIN 50 MG PO CAPS: 50.0000 mg | ORAL_CAPSULE | Freq: Two times a day (BID) | ORAL | 1 refills | Status: DC

## 2016-04-22 NOTE — Patient Instructions (Signed)
Injection in 3 wks , will give valium 10mg  prior to   Tramadol 100mg  BID  Lyrica 50mg  BID

## 2016-04-22 NOTE — Progress Notes (Signed)
Subjective:    Patient ID: Jerry [name], male    DOB: [birth year], 60 y.o.   MRN: [id number]  HPI 60 year old male with history of low back pain radiating to the lower extremities. He's complained of knee pain, but despite left total knee replacement for osteoarthritis. He continued to experience pain.  Because of his lower extremity complaints as well as some complaints of subjective numbness. An MRI of the lumbar spine was performed on 04/10/2016. Patient is here for review of his imaging study and to formulate a treatment plan.  Patient has no new bowel or bladder dysfunction. No falls. No progressive weakness. no fevers or chills    Pain Inventory Average Pain 8 Pain Right Now 6 My pain is constant and sharp  In the last 24 hours, has pain interfered with the following? General activity 6 Relation with others 6 Enjoyment of life 6 What TIME of day is your pain at its worst? night Sleep (in general) Poor  Pain is worse with: walking, standing and some activites Pain improves with: heat/ice and medication Relief from Meds: 0  Mobility walk without assistance walk with assistance use a cane ability to climb steps?  yes do you drive?  yes  Function retired  Neuro/Psych trouble walking  Prior Studies Any changes since last visit?  no CT/MRI MRI LUMBAR SPINE WITHOUT CONTRAST  TECHNIQUE: Multiplanar, multisequence MR imaging of the lumbar spine was performed. No intravenous contrast was administered.  COMPARISON:  None.  FINDINGS: Segmentation:  Unremarkable.  Alignment: There is straightening of the normal lumbar lordosis. Trace retrolisthesis is seen at L3-4, L4-5 and L5-S1.  Vertebrae: No fracture or worrisome lesion. There is some degenerative endplate signal change at L4-5 and L5-S1.  Conus medullaris: Extends to the L1 level and appears normal.  Paraspinal and other soft tissues: Unremarkable.  Disc levels:  T11-12 and T12-L1 are  imaged in the sagittal plane only and negative.  L1-2:  Negative.  L2-3: Right paravertebral disc bulge extends into the right foramen causing mild foraminal narrowing. The central canal and left foramen are widely patent.  L3-4: Shallow broad-based disc bulge is slightly more prominent to the left and causes mild narrowing in the left lateral recess. Moderate to moderately severe left foraminal narrowing is also identified. The right foramen is open.  L4-5: The patient has a broad-based central disc protrusion causing moderate to moderately severe central canal stenosis and bilateral subarticular recess stenosis which could impact either descending L5 root. Subarticular stenosis appears worse on the left. The right foramen is open. Moderate to moderately severe left foraminal stenosis is identified.  L5-S1: Shallow disc bulge endplate spur without central canal stenosis. Mild to moderate foraminal narrowing is worse on the left.  IMPRESSION: Spondylosis appearing worst at L4-5 where there is moderate to moderately severe central canal stenosis and left worse than right subarticular recess stenosis. Either descending L5 root could be impacted. Moderate to moderately severe foraminal narrowing at this level appears worse on the left.  Mild left lateral recess narrowing at L3-4 where there is moderate to moderately severe left foraminal narrowing.  Mild to moderate foraminal narrowing at L5-S1 appears worse on the left.   Electronically Signed   By: Drusilla Kanner M.D.   On: 04/10/2016 11:48 Physicians involved in your care Any changes since last visit?  no   Family History  Problem Relation Age of Onset  . Diabetes Mother   . Other Father     death by MVA  .  Other Brother     died of MVA  . Stroke Neg Hx   . Heart disease Neg Hx   . Cancer Neg Hx   . Hypertension Neg Hx    Social History   Social History  . Marital status: Married    Spouse  name: N/A  . Number of children: N/A  . Years of education: N/A   Occupational History  . retired     prior physical therapist   Social History Main Topics  . Smoking status: Former Smoker    Types: Cigarettes    Quit date: 11/11/2001  . Smokeless tobacco: Never Used  . Alcohol use No  . Drug use: No  . Sexual activity: No   Other Topics Concern  . None   Social History Narrative   Married 22 years, 2 daughter and 1 son. Retired, used to work as a PT.  Exercises treadmill daily, some weight bearing exercise. Diet    Past Surgical History:  Procedure Laterality Date  . APPENDECTOMY    . COLONOSCOPY  08/2006   Dr. Russella Dar - repeat 2018  . INCISION AND DRAINAGE PERIRECTAL ABSCESS    . KNEE ARTHROSCOPY Left 12/28/2014   Procedure: ARTHROSCOPY LEFT KNEE WITH  MEDIAL MENSICUS DEBRIDEMENT;  Surgeon: Ollen Gross, MD;  Location: [facility];  Service: Orthopedics;  Laterality: Left;  . KNEE SURGERY     left knee - remote past  . LEFT HEART CATHETERIZATION WITH CORONARY ANGIOGRAM N/A 06/11/2013   Procedure: LEFT HEART CATHETERIZATION WITH CORONARY ANGIOGRAM;  Surgeon: Peter M Swaziland, MD;  Location: [facility] CATH [facility];  Service: Cardiovascular;  Laterality: N/A;  . right arm and elbow surgery    . TONSILLECTOMY    . TOTAL KNEE ARTHROPLASTY Left 10/04/2015   Procedure: LEFT TOTAL KNEE ARTHROPLASTY right knee aspiration and injection;  Surgeon: Ollen Gross, MD;  Location: [facility];  Service: Orthopedics;  Laterality: Left;  . wisdom teeth extractions     Past Medical History:  Diagnosis Date  . Arthritis   . Coronary artery disease    nonobstructive per cath 12/14.  normal LV function, falsely abnormal myoview stress test; Dr. Peter Swaziland  . Diabetes mellitus 2008   type II  . Dyslipidemia   . Erectile dysfunction   . History of cardiac catheterization 06/2013   Dr. Peter [place]and  . HTN (hypertension)   . Hypogonadism male    BP 134/86 (BP Location: Right Arm, Patient Position: Sitting, Cuff  Size: Large)   Pulse 88   Resp 16   SpO2 96%   Opioid Risk Score:   Fall Risk Score:  `1  Depression screen PHQ 2/9  Depression screen PHQ 2/9 03/21/2016  Decreased Interest 1  Down, Depressed, Hopeless 2  PHQ - 2 Score 3  Altered sleeping 3  Tired, decreased energy 3  Change in appetite 1  Feeling bad or failure about yourself  2  Trouble concentrating 1  Moving slowly or fidgety/restless 2  Suicidal thoughts 0  PHQ-9 Score 15  Difficult doing work/chores Somewhat difficult    Review of Systems  Constitutional: Negative.   HENT: Negative.   Eyes: Negative.   Respiratory: Negative.   Cardiovascular: Negative.   Gastrointestinal: Negative.   Endocrine: Negative.   Genitourinary: Negative.   Musculoskeletal: Positive for arthralgias, gait problem and myalgias.  Allergic/Immunologic: Negative.   Hematological: Negative.   Psychiatric/Behavioral: Negative.   All other systems reviewed and are negative.      Objective:   Physical Exam  Constitutional: He is oriented to person, place, and time. He appears well-developed and well-nourished.  HENT:  Head: Normocephalic and atraumatic.  Eyes: Conjunctivae and EOM are normal. Pupils are equal, round, and reactive to light.  Neurological: He is alert and oriented to person, place, and time.  Reflex Scores:      Patellar reflexes are 1+ on the right side and 1+ on the left side.      Achilles reflexes are 1+ on the right side and 1+ on the left side. Motor strength is 5/5 bilateral hip flexor, knee extensor, ankle dorsal flexor plantar flexor. Sensation intact to pinprick, bilateral L3, 4, 5, S1 dermatomal distribution  Gait is antalgic forward flexed mildly wide-based   Psychiatric: He has a normal mood and affect.  Nursing note and vitals reviewed.         Assessment & Plan:  1 Lumbar spinal stenosis-with neurogenic claudication. Patient has no fixed neurologic deficits other than some loss of deep tendon  reflex.   We discussed treatment options including medications Because urine drug screen demonstrated cocaine metabolites as well as alcohol do not feel schedule 2 or schedule. 3. Narcotic analgesics are indicated in terms of risk-benefit ratio.  Tramadol 100 mg twice a day has been prescribed in conjunction with Lyrica 50 mg twice a day. May need to titrate upward from there We looked over his scans and discussed interventional pain management He may benefit from left L3-L4 transforaminal injection Versus translaminar. Would favor transforaminal route initially. Discussed with patient agrees with plan.

## 2016-05-13 ENCOUNTER — Ambulatory Visit: Payer: Medicare HMO | Admitting: Physical Medicine & Rehabilitation

## 2016-05-17 ENCOUNTER — Encounter: Payer: Medicare HMO | Attending: Physical Medicine & Rehabilitation

## 2016-05-17 ENCOUNTER — Ambulatory Visit (HOSPITAL_BASED_OUTPATIENT_CLINIC_OR_DEPARTMENT_OTHER): Payer: Medicare HMO | Admitting: Physical Medicine & Rehabilitation

## 2016-05-17 DIAGNOSIS — I10 Essential (primary) hypertension: Secondary | ICD-10-CM | POA: Insufficient documentation

## 2016-05-17 DIAGNOSIS — M25522 Pain in left elbow: Secondary | ICD-10-CM | POA: Diagnosis not present

## 2016-05-17 DIAGNOSIS — G8929 Other chronic pain: Secondary | ICD-10-CM | POA: Diagnosis not present

## 2016-05-17 DIAGNOSIS — N529 Male erectile dysfunction, unspecified: Secondary | ICD-10-CM | POA: Diagnosis not present

## 2016-05-17 DIAGNOSIS — E785 Hyperlipidemia, unspecified: Secondary | ICD-10-CM | POA: Insufficient documentation

## 2016-05-17 DIAGNOSIS — E669 Obesity, unspecified: Secondary | ICD-10-CM | POA: Insufficient documentation

## 2016-05-17 DIAGNOSIS — M5442 Lumbago with sciatica, left side: Secondary | ICD-10-CM | POA: Diagnosis not present

## 2016-05-17 DIAGNOSIS — I2510 Atherosclerotic heart disease of native coronary artery without angina pectoris: Secondary | ICD-10-CM | POA: Diagnosis not present

## 2016-05-17 DIAGNOSIS — Z87891 Personal history of nicotine dependence: Secondary | ICD-10-CM | POA: Insufficient documentation

## 2016-05-17 DIAGNOSIS — M25562 Pain in left knee: Secondary | ICD-10-CM | POA: Diagnosis present

## 2016-05-17 DIAGNOSIS — Z9889 Other specified postprocedural states: Secondary | ICD-10-CM | POA: Diagnosis not present

## 2016-05-17 NOTE — Progress Notes (Signed)
  PROCEDURE RECORD [facility]   Name: Jerry [name] DOB:[birth year] MRN: [id number]  Date:05/17/2016  Physician: Claudette Laws, MD    Nurse/CMA: Bing Plume  Allergies:  Allergies  Allergen Reactions  . Diclofenac Sodium Other (See Comments)    Gel caused a burning sensation   . Lisinopril Cough  . Losartan Cough    Unknown bad side effects Unknown bad side effects  . Voltaren [Diclofenac] Other (See Comments)    Gel caused a burning sensation     Consent Signed: Yes.    Is patient diabetic? Yes.    CBG today? 130  Pregnant: No. LMP: No LMP for male patient. (age 44-55)  Anticoagulants: no Anti-inflammatory: no Antibiotics: no  Procedure: Left Transforaminal Epidural steroid injection Position: Prone Start Time: 1415 End Time:1420  Fluoro Time:25  RN/CMA Gillermo Murdoch    Time 1351 1423    BP 159/99 185/98    Pulse 89 88    Respirations 14 14    O2 Sat 96 98    S/S 6 6    Pain Level 8 0     D/C home with Wife, patient A & O X 3, D/C instructions reviewed, and sits independently.

## 2016-05-17 NOTE — Patient Instructions (Signed)

## 2016-06-10 ENCOUNTER — Other Ambulatory Visit: Payer: Self-pay | Admitting: *Deleted

## 2016-06-12 ENCOUNTER — Telehealth: Payer: Self-pay | Admitting: Physical Medicine & Rehabilitation

## 2016-06-13 MED ORDER — TRAMADOL HCL 50 MG PO TABS: 100.0000 mg | ORAL_TABLET | Freq: Two times a day (BID) | ORAL | 0 refills | Status: DC

## 2016-06-13 MED ORDER — PREGABALIN 50 MG PO CAPS: 50.0000 mg | ORAL_CAPSULE | Freq: Two times a day (BID) | ORAL | 0 refills | Status: DC

## 2016-06-13 NOTE — Telephone Encounter (Signed)
Received an Rx request to refill Lyrica 50 MG cap and Tramadol 50 MG tab. Please advise

## 2016-06-13 NOTE — Telephone Encounter (Signed)
Rx phoned in, one month supply only

## 2016-06-13 NOTE — Telephone Encounter (Signed)
May refill 53mo supply

## 2016-07-18 ENCOUNTER — Encounter: Payer: Self-pay | Admitting: Gastroenterology

## 2016-10-03 ENCOUNTER — Ambulatory Visit: Payer: Self-pay | Admitting: Orthopedic Surgery

## 2016-10-14 ENCOUNTER — Other Ambulatory Visit: Payer: Self-pay | Admitting: Orthopedic Surgery

## 2016-10-18 ENCOUNTER — Other Ambulatory Visit: Payer: Self-pay | Admitting: Orthopedic Surgery

## 2016-10-22 ENCOUNTER — Ambulatory Visit: Payer: Self-pay | Admitting: Orthopedic Surgery

## 2016-10-22 NOTE — H&P (Signed)
Jerry [name] DOB: 11/04/1955 Married / Language: English / Race: Black or African American Male Date of Admission:  11/04/16 CC:  Right knee pain History of Present Illness  The patient is a 61 year old male who comes in for a preoperative History and Physical. The patient is scheduled for a right total knee arthroplasty to be performed by Dr. Frank V. Aluisio, MD at [facility] on 11-04-2016. The patient is a 61 year old male who presented for follow up of their knee. The patient is being followed for their right knee pain and osteoarthritis. They are now months out from cortisone injection. Symptoms reported include: pain, swelling, aching and difficulty ambulating. The patient feels that they are doing poorly and report their pain level to be severe. The following medication has been used for pain control: none (He reports tramadol made him constipated, but did not help with pain even after taking 3-4 at a time). The patient has not gotten any relief of their symptoms with Cortisone injections. The right knee has gotten progressively worse to the point where he cannot function well any more. He did great with his left knee replacement once he got past the first few months. He has no problems with the left knee now. Right knee is as bad as the left one was when he had it replaced. It is hurting with all activities even hurting at rest. It is limiting what he can and cannot do. He is ready to get the right knee fixed at this time. They have been treated conservatively in the past for the above stated problem and despite conservative measures, they continue to have progressive pain and severe functional limitations and dysfunction. They have failed non-operative management including home exercise, medications, and injections. It is felt that they would benefit from undergoing total joint replacement. Risks and benefits of the procedure have been discussed with the patient and they elect to  proceed with surgery. There are no active contraindications to surgery such as ongoing infection or rapidly progressive neurological disease.  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  Family History First Degree Relatives  reported Diabetes Mellitus  mother Hypertension  mother Rheumatoid Arthritis  mother  Social History  Tobacco use  current some days smoker; smoke(d) 1 pack(s) per day Tobacco / smoke exposure  yes outdoors only Marital status  married Living situation  live with spouse Pain Contract  no Number of flights of stairs before winded  1 Drug/Alcohol Rehab (Previously)  no Drug/Alcohol Rehab (Currently)  no Illicit drug use  no Exercise  Exercises daily; does other Children  4 Alcohol use  current drinker; drinks beer; only occasionally per week Current work status  disabled Current some day smoker   Medication History  TraMADol HCl (50MG Tablet, 1-2 Oral every 6 hours as needed for painVitamin B-12 Active. HydroCHLOROthiazide (12.5MG Tablet, Oral) Active. Atorvastatin Calcium (40MG Tablet, Oral) Active. MetFORMIN HCl (850MG Tablet, Oral) Active. Lantus SoloStar (100UNIT/ML Solution, Subcutaneous) Active. Aspirin (81MG Tablet, 1 (one) Oral) Active.  Past Surgical History  Arthroscopy of Knee  left Total Knee Replacement -   Left [10/04/2015]: Appendectomy  Right Arm Surgery   Review of Systems General Not Present- Chills, Fatigue, Fever, Memory Loss, Night Sweats, Weight Gain and  Weight Loss. Skin Not Present- Eczema, Hives, Itching, Lesions and Rash. HEENT Not Present- Dentures, Double Vision, Headache, Hearing Loss, Tinnitus and Visual Loss. Respiratory Not Present- Allergies, Chronic Cough, Coughing up blood, Shortness of breath at rest and Shortness of breath with exertion. Cardiovascular Not Present- Chest Pain, Difficulty Breathing Lying Down, Murmur, Palpitations, Racing/skipping heartbeats and Swelling. Gastrointestinal Not Present- Abdominal Pain, Bloody Stool, Constipation, Diarrhea, Difficulty Swallowing, Heartburn, Jaundice, Loss of appetitie, Nausea and Vomiting. Male Genitourinary Not Present- Blood in Urine, Discharge, Flank Pain, Incontinence, Painful Urination, Urgency, Urinary frequency, Urinary Retention, Urinating at Night and Weak urinary stream. Musculoskeletal Present- Joint Pain. Not Present- Back Pain, Joint Swelling, Morning Stiffness, Muscle Pain, Muscle Weakness and Spasms. Neurological Not Present- Blackout spells, Difficulty with balance, Dizziness, Paralysis, Tremor and Weakness. Psychiatric Not Present- Insomnia.  Vitals Weight: 283 lb Height: 76in Weight was reported by patient. Height was reported by patient. Body Surface Area: 2.57 m Body Mass Index: 34.45 kg/m  Pulse: 84 (Regular)  BP: 142/76 (Sitting, Right Arm, Standard)  Physical Exam  General Mental Status -Alert, cooperative and good historian. General Appearance-pleasant, Not in acute distress. Orientation-Oriented X3. Build & Nutrition-Well nourished and Well developed.  Head and Neck Head-normocephalic, atraumatic . Neck Global Assessment - supple, no bruit auscultated on the right, no bruit auscultated on the left.  Eye Pupil - Bilateral-Regular and Round. Motion - Bilateral-EOMI.  ENMT Note: dentures   Chest and Lung Exam Auscultation Breath sounds - clear at anterior chest wall and clear at posterior chest wall. Adventitious  sounds - No Adventitious sounds.  Cardiovascular Auscultation Rhythm - Regular rate and rhythm. Heart Sounds - S1 WNL and S2 WNL. Murmurs & Other Heart Sounds - Auscultation of the heart reveals - No Murmurs.  Abdomen Inspection Contour - Generalized moderate distention. Palpation/Percussion Tenderness - Abdomen is non-tender to palpation. Rigidity (guarding) - Abdomen is soft. Auscultation Auscultation of the abdomen reveals - Bowel sounds normal.  Male Genitourinary Note: Not done, not pertinent to present illness   Musculoskeletal Note: On exam, he is in no distress. Evaluation of his left knee shows no swelling. Range 0 to 120 with no tenderness or instability. Right knee, slight varus, no effusion. Range 5 to 125. He does not have any instability about that knee. He is diffusely tender around the right knee.  RADIOGRAPHS His radiographs, AP and lateral of both knees are reviewed today and his prosthesis on the left is in excellent position, no abnormalities. On the right, he has bone-on-bone arthritis medial and near bone-on-bone arthritis patellofemoral in that right knee.  Assessment & Plan Primary osteoarthritis of right knee (M17.11) Status post total left knee replacement using cement (B34.193)  Note:Surgical Plans: Right Total Knee Replacement  Disposition: Home with HHPT  PCP: Dr. Victorino Dike Cowllard  IV TXA  Anesthesia Issues: None  Patient was instructed on what medications to stop prior to surgery.  Signed electronically by Lauraine Rinne, III PA-C

## 2016-10-28 NOTE — Patient Instructions (Addendum)
Jerry [name]  10/28/2016   Your procedure is scheduled on: 11/04/2016    Report to [facility]  Entr[facility] and go to Admitting at 1000am.       Call this number if you have problems the morning of surgery  [phone]   Remember: ONLY 1 PERSON MAY GO WITH YOU TO SHORT STAY TO GET  READY MORNING OF YOUR SURGERY.  Do not eat food or drink liquids :After Midnight.     Take these medicines the morning of surgery with A SIP OF WATER: Amlodipine ( NORvasc), Carvedilol ( Coreg), Gabapentin, Lyrica, Tramadol ( Ultram) DO NOT TAKE ANY DIABETIC MEDICATIONS DAY OF YOUR SURGERY                               You may not have any metal on your body including hair pins and              piercings  Do not wear jewelry, , lotions, powders or perfumes, deodorant                          Men may shave face and neck.   Do not bring valuables to the hospital. [facility] IS NOT             RESPONSIBLE   FOR VALUABLES.  Contacts, dentures or bridgework may not be worn into surgery.  Leave suitcase in the car. After surgery it may be brought to your room.       How to Manage Your Diabetes Before and After Surgery  Why is it important to control my blood sugar before and after surgery? . Improving blood sugar levels before and after surgery helps healing and can limit problems. . A way of improving blood sugar control is eating a healthy diet by: o  Eating less sugar and carbohydrates o  Increasing activity/exercise o  Talking with your doctor about reaching your blood sugar goals . High blood sugars (greater than 180 mg/dL) can raise your risk of infections and slow your recovery, so you will need to focus on controlling your diabetes during the weeks before surgery. . Make sure that the doctor who takes care of your diabetes knows about your planned surgery including the date and location.  How do I manage my blood sugar before surgery? . Check your blood sugar at  least 4 times a day, starting 2 days before surgery, to make sure that the level is not too high or low. o Check your blood sugar the morning of your surgery when you wake up and every 2 hours until you get to the Short Stay unit. . If your blood sugar is less than 70 mg/dL, you will need to treat for low blood sugar: o Do not take insulin. o Treat a low blood sugar (less than 70 mg/dL) with  cup of clear juice (cranberry or apple), 4 glucose tablets, OR glucose gel. o Recheck blood sugar in 15 minutes after treatment (to make sure it is greater than 70 mg/dL). If your blood sugar is not greater than 70 mg/dL on recheck, call [phone] for further instructions. . Report your blood sugar to the short stay nurse when you get to Short Stay.  . If you are admitted to the hospital after surgery: o  Your blood sugar will be checked by the staff and you will probably be given insulin after surgery (instead of oral diabetes medicines) to make sure you have good blood sugar levels. o The goal for blood sugar control after surgery is 80-180 mg/dL.   WHAT DO I DO ABOUT MY DIABETES MEDICATION?  Marland Kitchen Do not take oral diabetes medicines (pills) the morning of surgery.  . THE NIGHT BEFORE SURGERY, take   1/2 of evening dose of Lantus Insulin.         .  .  .  .         Patient Signature:  Date:   Nurse Signature:  Date:   Reviewed and Endorsed by [facility] Patient Education Committee, August 2015  Special Instructions: N/A              Please read over the following fact sheets you were given: _____________________________________________________________________             [facility] - Preparing for Surgery Before surgery, you can play an important role.  Because skin is not sterile, your skin needs to be as free of germs as possible.  You can reduce the number of germs on your skin by washing with CHG (chlorahexidine gluconate) soap before surgery.  CHG is an antiseptic cleaner which  kills germs and bonds with the skin to continue killing germs even after washing. Please DO NOT use if you have an allergy to CHG or antibacterial soaps.  If your skin becomes reddened/irritated stop using the CHG and inform your nurse when you arrive at Short Stay. Do not shave (including legs and underarms) for at least 48 hours prior to the first CHG shower.  You may shave your face/neck. Please follow these instructions carefully:  1.  Shower with CHG Soap the night before surgery and the  morning of Surgery.  2.  If you choose to wash your hair, wash your hair first as usual with your  normal  shampoo.  3.  After you shampoo, rinse your hair and body thoroughly to remove the  shampoo.                           4.  Use CHG as you would any other liquid soap.  You can apply chg directly  to the skin and wash                       Gently with a scrungie or clean washcloth.  5.  Apply the CHG Soap to your body ONLY FROM THE NECK DOWN.   Do not use on face/ open                           Wound or open sores. Avoid contact with eyes, ears mouth and genitals (private parts).                       Wash face,  Genitals (private parts) with your normal soap.             6.  Wash thoroughly, paying special attention to the area where your surgery  will be performed.  7.  Thoroughly rinse your body with warm water from the neck down.  8.  DO NOT shower/wash with your normal soap after using and rinsing off  the CHG Soap.  9.  Pat yourself dry with a clean towel.            10.  Wear clean pajamas.            11.  Place clean sheets on your bed the night of your first shower and do not  sleep with pets. Day of Surgery : Do not apply any lotions/deodorants the morning of surgery.  Please wear clean clothes to the hospital/surgery center.  FAILURE TO FOLLOW THESE INSTRUCTIONS MAY RESULT IN THE CANCELLATION OF YOUR SURGERY PATIENT SIGNATURE_________________________________  NURSE  SIGNATURE__________________________________  ________________________________________________________________________  WHAT IS A BLOOD TRANSFUSION? Blood Transfusion Information  A transfusion is the replacement of blood or some of its parts. Blood is made up of multiple cells which provide different functions.  Red blood cells carry oxygen and are used for blood loss replacement.  White blood cells fight against infection.  Platelets control bleeding.  Plasma helps clot blood.  Other blood products are available for specialized needs, such as hemophilia or other clotting disorders. BEFORE THE TRANSFUSION  Who gives blood for transfusions?   Healthy volunteers who are fully evaluated to make sure their blood is safe. This is blood bank blood. Transfusion therapy is the safest it has ever been in the practice of medicine. Before blood is taken from a donor, a complete history is taken to make sure that person has no history of diseases nor engages in risky social behavior (examples are intravenous drug use or sexual activity with multiple partners). The donor's travel history is screened to minimize risk of transmitting infections, such as malaria. The donated blood is tested for signs of infectious diseases, such as HIV and hepatitis. The blood is then tested to be sure it is compatible with you in order to minimize the chance of a transfusion reaction. If you or a relative donates blood, this is often done in anticipation of surgery and is not appropriate for emergency situations. It takes many days to process the donated blood. RISKS AND COMPLICATIONS Although transfusion therapy is very safe and saves many lives, the main dangers of transfusion include:   Getting an infectious disease.  Developing a transfusion reaction. This is an allergic reaction to something in the blood you were given. Every precaution is taken to prevent this. The decision to have a blood transfusion has been  considered carefully by your caregiver before blood is given. Blood is not given unless the benefits outweigh the risks. AFTER THE TRANSFUSION  Right after receiving a blood transfusion, you will usually feel much better and more energetic. This is especially true if your red blood cells have gotten low (anemic). The transfusion raises the level of the red blood cells which carry oxygen, and this usually causes an energy increase.  The nurse administering the transfusion will monitor you carefully for complications. HOME CARE INSTRUCTIONS  No special instructions are needed after a transfusion. You may find your energy is better. Speak with your caregiver about any limitations on activity for underlying diseases you may have. SEEK MEDICAL CARE IF:   Your condition is not improving after your transfusion.  You develop redness or irritation at the intravenous (IV) site. SEEK IMMEDIATE MEDICAL CARE IF:  Any of the following symptoms occur over the next 12 hours:  Shaking chills.  You have a temperature by mouth above 102 F (38.9 C), not controlled by medicine.  Chest, back, or muscle pain.  People around you feel you are not acting correctly or  are confused.  Shortness of breath or difficulty breathing.  Dizziness and fainting.  You get a rash or develop hives.  You have a decrease in urine output.  Your urine turns a dark color or changes to pink, red, or brown. Any of the following symptoms occur over the next 10 days:  You have a temperature by mouth above 102 F (38.9 C), not controlled by medicine.  Shortness of breath.  Weakness after normal activity.  The white part of the eye turns yellow (jaundice).  You have a decrease in the amount of urine or are urinating less often.  Your urine turns a dark color or changes to pink, red, or brown. Document Released: 06/21/2000 Document Revised: 09/16/2011 Document Reviewed: 02/08/2008 ExitCare Patient Information 2014  [place].  _______________________________________________________________________  Incentive Spirometer  An incentive spirometer is a tool that can help keep your lungs clear and active. This tool measures how well you are filling your lungs with each breath. Taking long deep breaths may help reverse or decrease the chance of developing breathing (pulmonary) problems (especially infection) following:  A long period of time when you are unable to move or be active. BEFORE THE PROCEDURE   If the spirometer includes an indicator to show your best effort, your nurse or respiratory therapist will set it to a desired goal.  If possible, sit up straight or lean slightly forward. Try not to slouch.  Hold the incentive spirometer in an upright position. INSTRUCTIONS FOR USE  1. Sit on the edge of your bed if possible, or sit up as far as you can in bed or on a chair. 2. Hold the incentive spirometer in an upright position. 3. Breathe out normally. 4. Place the mouthpiece in your mouth and seal your lips tightly around it. 5. Breathe in slowly and as deeply as possible, raising the piston or the ball toward the top of the column. 6. Hold your breath for 3-5 seconds or for as long as possible. Allow the piston or ball to fall to the bottom of the column. 7. Remove the mouthpiece from your mouth and breathe out normally. 8. Rest for a few seconds and repeat Steps 1 through 7 at least 10 times every 1-2 hours when you are awake. Take your time and take a few normal breaths between deep breaths. 9. The spirometer may include an indicator to show your best effort. Use the indicator as a goal to work toward during each repetition. 10. After each set of 10 deep breaths, practice coughing to be sure your lungs are clear. If you have an incision (the cut made at the time of surgery), support your incision when coughing by placing a pillow or rolled up towels firmly against it. Once you are able to get  out of bed, walk around indoors and cough well. You may stop using the incentive spirometer when instructed by your caregiver.  RISKS AND COMPLICATIONS  Take your time so you do not get dizzy or light-headed.  If you are in pain, you may need to take or ask for pain medication before doing incentive spirometry. It is harder to take a deep breath if you are having pain. AFTER USE  Rest and breathe slowly and easily.  It can be helpful to keep track of a log of your progress. Your caregiver can provide you with a simple table to help with this. If you are using the spirometer at home, follow these instructions: [address] IF:  You are having difficultly using the spirometer.  You have trouble using the spirometer as often as instructed.  Your pain medication is not giving enough relief while using the spirometer.  You develop fever of 100.5 F (38.1 C) or higher. SEEK IMMEDIATE MEDICAL CARE IF:   You cough up bloody sputum that had not been present before.  You develop fever of 102 F (38.9 C) or greater.  You develop worsening pain at or near the incision site. MAKE SURE YOU:   Understand these instructions.  Will watch your condition.  Will get help right away if you are not doing well or get worse. Document Released: 11/04/2006 Document Revised: 09/16/2011 Document Reviewed: 01/05/2007 [facility] Patient Information 2014 [place], [place].   ________________________________________________________________________

## 2016-10-29 ENCOUNTER — Encounter (HOSPITAL_COMMUNITY): Payer: Self-pay

## 2016-10-29 ENCOUNTER — Encounter (HOSPITAL_COMMUNITY)
Admission: RE | Admit: 2016-10-29 | Discharge: 2016-10-29 | Disposition: A | Discharge status: Home / Self Care | Payer: Medicare PPO | Source: Ambulatory Visit | Attending: Orthopedic Surgery | Admitting: Orthopedic Surgery

## 2016-10-29 DIAGNOSIS — M1711 Unilateral primary osteoarthritis, right knee: Secondary | ICD-10-CM | POA: Insufficient documentation

## 2016-10-29 DIAGNOSIS — Z01810 Encounter for preprocedural cardiovascular examination: Secondary | ICD-10-CM | POA: Insufficient documentation

## 2016-10-29 DIAGNOSIS — Z01812 Encounter for preprocedural laboratory examination: Secondary | ICD-10-CM | POA: Diagnosis not present

## 2016-10-29 LAB — COMPREHENSIVE METABOLIC PANEL
ALBUMIN: 4.5 g/dL (ref 3.5–5.0)
ALK PHOS: 61 U/L (ref 38–126)
ALT: 20 U/L (ref 17–63)
AST: 20 U/L (ref 15–41)
Anion gap: 8 (ref 5–15)
BILIRUBIN TOTAL: 0.9 mg/dL (ref 0.3–1.2)
BUN: 11 mg/dL (ref 6–20)
CALCIUM: 9.3 mg/dL (ref 8.9–10.3)
CO2: 22 mmol/L (ref 22–32)
CREATININE: 0.89 mg/dL (ref 0.61–1.24)
Chloride: 102 mmol/L (ref 101–111)
GFR calc Af Amer: >60 mL/min (ref >60)
GFR calc non Af Amer: >60 mL/min (ref >60)
GLUCOSE: 308 mg/dL — AB (ref 65–99)
Potassium: 4.3 mmol/L (ref 3.5–5.1)
SODIUM: 132 mmol/L — AB (ref 135–145)
Total Protein: 7.8 g/dL (ref 6.5–8.1)

## 2016-10-29 LAB — CBC
HEMATOCRIT: 40.8 % (ref 39.0–52.0)
HEMOGLOBIN: 15 g/dL (ref 13.0–17.0)
MCH: 35 pg — AB (ref 26.0–34.0)
MCHC: 36.8 g/dL — ABNORMAL HIGH (ref 30.0–36.0)
MCV: 95.1 fL (ref 78.0–100.0)
Platelets: 209 10*3/uL (ref 150–400)
RBC: 4.29 MIL/uL (ref 4.22–5.81)
RDW: 12.2 % (ref 11.5–15.5)
WBC: 6.9 10*3/uL (ref 4.0–10.5)

## 2016-10-29 LAB — PROTIME-INR
INR: 0.98
Prothrombin Time: 13 seconds (ref 11.4–15.2)

## 2016-10-29 LAB — GLUCOSE, CAPILLARY: GLUCOSE-CAPILLARY: 296 mg/dL — AB (ref 65–99)

## 2016-10-29 LAB — SURGICAL PCR SCREEN
MRSA, PCR: NEGATIVE
STAPHYLOCOCCUS AUREUS: NEGATIVE

## 2016-10-29 LAB — APTT: APTT: 36 s (ref 24–36)

## 2016-10-29 NOTE — Progress Notes (Signed)
10/02/16- clearance- Jennifer Couillard PA on chart  10/02/16- hgba1c- 7.4 on chart

## 2016-10-30 NOTE — Progress Notes (Signed)
Final EKG done 10/29/16-epic  

## 2016-11-03 MED ORDER — CEFAZOLIN SODIUM 10 G IJ SOLR
3.0000 g | INTRAMUSCULAR | Status: DC
  Filled 2016-11-03 (×2): qty 3000

## 2016-11-04 ENCOUNTER — Encounter (HOSPITAL_COMMUNITY)
Admission: RE | Disposition: A | Discharge status: Home Health | Payer: Self-pay | Source: Ambulatory Visit | Attending: Orthopedic Surgery

## 2016-11-04 ENCOUNTER — Inpatient Hospital Stay (HOSPITAL_COMMUNITY): Payer: Medicare PPO | Admitting: Anesthesiology

## 2016-11-04 ENCOUNTER — Inpatient Hospital Stay (HOSPITAL_COMMUNITY)
Admission: RE | Admit: 2016-11-04 | Discharge: 2016-11-06 | DRG: 470 | Disposition: A | Discharge status: Home Health | Payer: Medicare PPO | Source: Ambulatory Visit | Attending: Orthopedic Surgery | Admitting: Orthopedic Surgery

## 2016-11-04 ENCOUNTER — Encounter (HOSPITAL_COMMUNITY): Payer: Self-pay | Admitting: *Deleted

## 2016-11-04 DIAGNOSIS — Z6834 Body mass index (BMI) 34.0-34.9, adult: Secondary | ICD-10-CM | POA: Diagnosis not present

## 2016-11-04 DIAGNOSIS — Z87891 Personal history of nicotine dependence: Secondary | ICD-10-CM

## 2016-11-04 DIAGNOSIS — E7800 Pure hypercholesterolemia, unspecified: Secondary | ICD-10-CM | POA: Diagnosis present

## 2016-11-04 DIAGNOSIS — E669 Obesity, unspecified: Secondary | ICD-10-CM | POA: Diagnosis present

## 2016-11-04 DIAGNOSIS — J449 Chronic obstructive pulmonary disease, unspecified: Secondary | ICD-10-CM | POA: Diagnosis present

## 2016-11-04 DIAGNOSIS — Z794 Long term (current) use of insulin: Secondary | ICD-10-CM | POA: Diagnosis not present

## 2016-11-04 DIAGNOSIS — I10 Essential (primary) hypertension: Secondary | ICD-10-CM | POA: Diagnosis present

## 2016-11-04 DIAGNOSIS — Z7982 Long term (current) use of aspirin: Secondary | ICD-10-CM

## 2016-11-04 DIAGNOSIS — E785 Hyperlipidemia, unspecified: Secondary | ICD-10-CM | POA: Diagnosis present

## 2016-11-04 DIAGNOSIS — E119 Type 2 diabetes mellitus without complications: Secondary | ICD-10-CM | POA: Diagnosis present

## 2016-11-04 DIAGNOSIS — M1711 Unilateral primary osteoarthritis, right knee: Secondary | ICD-10-CM | POA: Diagnosis present

## 2016-11-04 DIAGNOSIS — M1710 Unilateral primary osteoarthritis, unspecified knee: Secondary | ICD-10-CM | POA: Diagnosis present

## 2016-11-04 DIAGNOSIS — M179 Osteoarthritis of knee, unspecified: Secondary | ICD-10-CM | POA: Diagnosis present

## 2016-11-04 HISTORY — PX: TOTAL KNEE ARTHROPLASTY: SHX125

## 2016-11-04 LAB — TYPE AND SCREEN
ABO/RH(D): A POS
Antibody Screen: NEGATIVE

## 2016-11-04 LAB — GLUCOSE, CAPILLARY
Glucose-Capillary: 259 mg/dL — ABNORMAL HIGH (ref 65–99)
Glucose-Capillary: 203 mg/dL — ABNORMAL HIGH (ref 65–99)
Glucose-Capillary: 253 mg/dL — ABNORMAL HIGH (ref 65–99)
Glucose-Capillary: 314 mg/dL — ABNORMAL HIGH (ref 65–99)

## 2016-11-04 SURGERY — ARTHROPLASTY, KNEE, TOTAL
Anesthesia: Spinal | Site: Knee | Laterality: Right

## 2016-11-04 MED ORDER — ACETAMINOPHEN 10 MG/ML IV SOLN
1000.0000 mg | Freq: Once | INTRAVENOUS | Status: AC
  Administered 2016-11-04: 1000 mg via INTRAVENOUS

## 2016-11-04 MED ORDER — GABAPENTIN 300 MG PO CAPS
ORAL_CAPSULE | ORAL | Status: AC
  Filled 2016-11-04: qty 1

## 2016-11-04 MED ORDER — OXYCODONE HCL 5 MG PO TABS
5.0000 mg | ORAL_TABLET | ORAL | Status: DC | PRN
  Administered 2016-11-04: 5 mg via ORAL
  Administered 2016-11-04: 10 mg via ORAL
  Administered 2016-11-04: 5 mg via ORAL
  Administered 2016-11-05 (×3): 10 mg via ORAL
  Filled 2016-11-04 (×2): qty 1
  Filled 2016-11-04 (×4): qty 2

## 2016-11-04 MED ORDER — PROPOFOL 10 MG/ML IV BOLUS
INTRAVENOUS | Status: AC
  Filled 2016-11-04: qty 60

## 2016-11-04 MED ORDER — RIVAROXABAN 10 MG PO TABS
10.0000 mg | ORAL_TABLET | Freq: Every day | ORAL | Status: DC
  Administered 2016-11-05 – 2016-11-06 (×2): 10 mg via ORAL
  Filled 2016-11-04 (×2): qty 1

## 2016-11-04 MED ORDER — ROPIVACAINE HCL 5 MG/ML IJ SOLN
INTRAMUSCULAR | Status: DC | PRN
  Administered 2016-11-04: 30 mL via PERINEURAL

## 2016-11-04 MED ORDER — PROMETHAZINE HCL 25 MG/ML IJ SOLN: 6.2500 mg | INTRAMUSCULAR | Status: DC | PRN

## 2016-11-04 MED ORDER — PHENOL 1.4 % MT LIQD
1.0000 | OROMUCOSAL | Status: DC | PRN
  Filled 2016-11-04: qty 177

## 2016-11-04 MED ORDER — HYDROMORPHONE HCL 1 MG/ML IJ SOLN
0.2500 mg | INTRAMUSCULAR | Status: DC | PRN
  Administered 2016-11-04 (×4): 0.5 mg via INTRAVENOUS

## 2016-11-04 MED ORDER — BUPIVACAINE LIPOSOME 1.3 % IJ SUSP
INTRAMUSCULAR | Status: DC | PRN
  Administered 2016-11-04: 20 mL

## 2016-11-04 MED ORDER — ONDANSETRON HCL 4 MG PO TABS: 4.0000 mg | ORAL_TABLET | Freq: Four times a day (QID) | ORAL | Status: DC | PRN

## 2016-11-04 MED ORDER — FENTANYL CITRATE (PF) 100 MCG/2ML IJ SOLN
INTRAMUSCULAR | Status: AC
  Filled 2016-11-04: qty 2

## 2016-11-04 MED ORDER — MORPHINE SULFATE (PF) 4 MG/ML IV SOLN
1.0000 mg | INTRAVENOUS | Status: DC | PRN
  Administered 2016-11-04 (×2): 1 mg via INTRAVENOUS
  Filled 2016-11-04 (×2): qty 1

## 2016-11-04 MED ORDER — PROPOFOL 500 MG/50ML IV EMUL
INTRAVENOUS | Status: DC | PRN
  Administered 2016-11-04: 100 ug/kg/min via INTRAVENOUS

## 2016-11-04 MED ORDER — PROPOFOL 10 MG/ML IV BOLUS
INTRAVENOUS | Status: AC
  Filled 2016-11-04: qty 20

## 2016-11-04 MED ORDER — INSULIN ASPART 100 UNIT/ML ~~LOC~~ SOLN
0.0000 [IU] | Freq: Every day | SUBCUTANEOUS | Status: DC
  Administered 2016-11-04 – 2016-11-05 (×2): 4 [IU] via SUBCUTANEOUS

## 2016-11-04 MED ORDER — CEFAZOLIN SODIUM-DEXTROSE 2-3 GM-% IV SOLR
INTRAVENOUS | Status: DC | PRN
  Administered 2016-11-04: 3 g via INTRAVENOUS

## 2016-11-04 MED ORDER — ACETAMINOPHEN 10 MG/ML IV SOLN
INTRAVENOUS | Status: AC
  Filled 2016-11-04: qty 100

## 2016-11-04 MED ORDER — SODIUM CHLORIDE 0.9 % IJ SOLN
INTRAMUSCULAR | Status: AC
  Filled 2016-11-04: qty 50

## 2016-11-04 MED ORDER — MEPERIDINE HCL 50 MG/ML IJ SOLN: 6.2500 mg | INTRAMUSCULAR | Status: DC | PRN

## 2016-11-04 MED ORDER — TRANEXAMIC ACID 1000 MG/10ML IV SOLN
1000.0000 mg | INTRAVENOUS | Status: AC
  Administered 2016-11-04: 1000 mg via INTRAVENOUS
  Filled 2016-11-04: qty 1100

## 2016-11-04 MED ORDER — MIDAZOLAM HCL 2 MG/2ML IJ SOLN
INTRAMUSCULAR | Status: AC
  Filled 2016-11-04: qty 2

## 2016-11-04 MED ORDER — GABAPENTIN 300 MG PO CAPS
300.0000 mg | ORAL_CAPSULE | Freq: Once | ORAL | Status: AC
  Administered 2016-11-04: 300 mg via ORAL

## 2016-11-04 MED ORDER — TRANEXAMIC ACID 1000 MG/10ML IV SOLN
1000.0000 mg | Freq: Once | INTRAVENOUS | Status: AC
  Administered 2016-11-04: 17:00:00 1000 mg via INTRAVENOUS
  Filled 2016-11-04: qty 1100

## 2016-11-04 MED ORDER — CHLORHEXIDINE GLUCONATE 4 % EX LIQD: 60.0000 mL | Freq: Once | CUTANEOUS | Status: DC

## 2016-11-04 MED ORDER — HYDROMORPHONE HCL 1 MG/ML IJ SOLN
INTRAMUSCULAR | Status: AC
  Filled 2016-11-04: qty 1

## 2016-11-04 MED ORDER — FLEET ENEMA 7-19 GM/118ML RE ENEM: 1.0000 | ENEMA | Freq: Once | RECTAL | Status: DC | PRN

## 2016-11-04 MED ORDER — LACTATED RINGERS IV SOLN
INTRAVENOUS | Status: DC
  Administered 2016-11-04 (×3): via INTRAVENOUS

## 2016-11-04 MED ORDER — DEXAMETHASONE SODIUM PHOSPHATE 10 MG/ML IJ SOLN
10.0000 mg | Freq: Once | INTRAMUSCULAR | Status: AC
  Administered 2016-11-04: 10 mg via INTRAVENOUS

## 2016-11-04 MED ORDER — CARVEDILOL 25 MG PO TABS
25.0000 mg | ORAL_TABLET | Freq: Two times a day (BID) | ORAL | Status: DC
  Administered 2016-11-04 – 2016-11-06 (×4): 25 mg via ORAL
  Filled 2016-11-04 (×4): qty 1

## 2016-11-04 MED ORDER — ONDANSETRON HCL 4 MG/2ML IJ SOLN
INTRAMUSCULAR | Status: DC | PRN
  Administered 2016-11-04: 4 mg via INTRAVENOUS

## 2016-11-04 MED ORDER — DOCUSATE SODIUM 100 MG PO CAPS
100.0000 mg | ORAL_CAPSULE | Freq: Two times a day (BID) | ORAL | Status: DC
  Administered 2016-11-04 – 2016-11-06 (×4): 100 mg via ORAL
  Filled 2016-11-04 (×4): qty 1

## 2016-11-04 MED ORDER — ONDANSETRON HCL 4 MG/2ML IJ SOLN
INTRAMUSCULAR | Status: AC
  Filled 2016-11-04: qty 2

## 2016-11-04 MED ORDER — INSULIN GLARGINE 100 UNIT/ML ~~LOC~~ SOLN
15.0000 [IU] | Freq: Every day | SUBCUTANEOUS | Status: DC
  Administered 2016-11-05: 22:00:00 15 [IU] via SUBCUTANEOUS
  Filled 2016-11-04 (×2): qty 0.15

## 2016-11-04 MED ORDER — PREGABALIN 50 MG PO CAPS
50.0000 mg | ORAL_CAPSULE | Freq: Two times a day (BID) | ORAL | Status: DC
  Administered 2016-11-04 – 2016-11-06 (×4): 50 mg via ORAL
  Filled 2016-11-04 (×4): qty 1

## 2016-11-04 MED ORDER — ACETAMINOPHEN 325 MG PO TABS
650.0000 mg | ORAL_TABLET | Freq: Four times a day (QID) | ORAL | Status: DC | PRN
Start: 2016-11-05 — End: 2016-11-06
  Administered 2016-11-06: 650 mg via ORAL
  Filled 2016-11-04: qty 2

## 2016-11-04 MED ORDER — DEXAMETHASONE SODIUM PHOSPHATE 10 MG/ML IJ SOLN
10.0000 mg | Freq: Once | INTRAMUSCULAR | Status: AC
  Administered 2016-11-05: 10 mg via INTRAVENOUS
  Filled 2016-11-04: qty 1

## 2016-11-04 MED ORDER — METOCLOPRAMIDE HCL 5 MG PO TABS: 5.0000 mg | ORAL_TABLET | Freq: Three times a day (TID) | ORAL | Status: DC | PRN

## 2016-11-04 MED ORDER — INSULIN ASPART 100 UNIT/ML ~~LOC~~ SOLN: 0.0000 [IU] | Freq: Three times a day (TID) | SUBCUTANEOUS | Status: DC

## 2016-11-04 MED ORDER — PROPOFOL 10 MG/ML IV BOLUS
INTRAVENOUS | Status: DC | PRN
  Administered 2016-11-04: 30 mg via INTRAVENOUS

## 2016-11-04 MED ORDER — MIDAZOLAM HCL 2 MG/2ML IJ SOLN
INTRAMUSCULAR | Status: AC
  Administered 2016-11-04: 2 mg
  Filled 2016-11-04: qty 2

## 2016-11-04 MED ORDER — ACETAMINOPHEN 650 MG RE SUPP
650.0000 mg | Freq: Four times a day (QID) | RECTAL | Status: DC | PRN
Start: 2016-11-05 — End: 2016-11-06

## 2016-11-04 MED ORDER — ACETAMINOPHEN 500 MG PO TABS
1000.0000 mg | ORAL_TABLET | Freq: Four times a day (QID) | ORAL | Status: AC
  Administered 2016-11-04 – 2016-11-05 (×4): 1000 mg via ORAL
  Filled 2016-11-04 (×4): qty 2

## 2016-11-04 MED ORDER — HYDROMORPHONE HCL 1 MG/ML IJ SOLN
0.5000 mg | INTRAMUSCULAR | Status: DC | PRN
  Administered 2016-11-04: 22:00:00 0.5 mg via INTRAVENOUS
  Administered 2016-11-05 (×4): 1 mg via INTRAVENOUS
  Filled 2016-11-04 (×5): qty 1

## 2016-11-04 MED ORDER — ROSUVASTATIN CALCIUM 5 MG PO TABS
5.0000 mg | ORAL_TABLET | Freq: Every evening | ORAL | Status: DC
  Administered 2016-11-04 – 2016-11-05 (×2): 5 mg via ORAL
  Filled 2016-11-04 (×2): qty 1

## 2016-11-04 MED ORDER — MIDAZOLAM HCL 2 MG/2ML IJ SOLN: 0.5000 mg | Freq: Once | INTRAMUSCULAR | Status: DC | PRN

## 2016-11-04 MED ORDER — DEXAMETHASONE SODIUM PHOSPHATE 10 MG/ML IJ SOLN
INTRAMUSCULAR | Status: AC
  Filled 2016-11-04: qty 1

## 2016-11-04 MED ORDER — INSULIN ASPART 100 UNIT/ML ~~LOC~~ SOLN
0.0000 [IU] | Freq: Three times a day (TID) | SUBCUTANEOUS | Status: DC
  Administered 2016-11-04: 17:00:00 5 [IU] via SUBCUTANEOUS
  Administered 2016-11-05: 11 [IU] via SUBCUTANEOUS
  Administered 2016-11-05 (×2): 8 [IU] via SUBCUTANEOUS
  Administered 2016-11-06 (×2): 5 [IU] via SUBCUTANEOUS
  Filled 2016-11-04: qty 1

## 2016-11-04 MED ORDER — MIDAZOLAM HCL 5 MG/ML IJ SOLN: 2.0000 mg | Freq: Once | INTRAMUSCULAR | Status: DC

## 2016-11-04 MED ORDER — METHOCARBAMOL 1000 MG/10ML IJ SOLN
500.0000 mg | Freq: Four times a day (QID) | INTRAVENOUS | Status: DC | PRN
  Administered 2016-11-04: 500 mg via INTRAVENOUS
  Filled 2016-11-04: qty 550

## 2016-11-04 MED ORDER — MENTHOL 3 MG MT LOZG
1.0000 | LOZENGE | OROMUCOSAL | Status: DC | PRN
  Filled 2016-11-04: qty 9

## 2016-11-04 MED ORDER — BUPIVACAINE LIPOSOME 1.3 % IJ SUSP
20.0000 mL | Freq: Once | INTRAMUSCULAR | Status: DC
  Filled 2016-11-04: qty 20

## 2016-11-04 MED ORDER — POLYETHYLENE GLYCOL 3350 17 G PO PACK
17.0000 g | PACK | Freq: Every day | ORAL | Status: DC | PRN
  Filled 2016-11-04: qty 1

## 2016-11-04 MED ORDER — CEFAZOLIN SODIUM-DEXTROSE 2-4 GM/100ML-% IV SOLN
2.0000 g | Freq: Four times a day (QID) | INTRAVENOUS | Status: AC
  Administered 2016-11-04 – 2016-11-05 (×2): 2 g via INTRAVENOUS
  Filled 2016-11-04 (×2): qty 100

## 2016-11-04 MED ORDER — GABAPENTIN 300 MG PO CAPS: 300.0000 mg | ORAL_CAPSULE | Freq: Three times a day (TID) | ORAL | Status: DC

## 2016-11-04 MED ORDER — BISACODYL 10 MG RE SUPP: 10.0000 mg | Freq: Every day | RECTAL | Status: DC | PRN

## 2016-11-04 MED ORDER — DIPHENHYDRAMINE HCL 12.5 MG/5ML PO ELIX: 12.5000 mg | ORAL_SOLUTION | ORAL | Status: DC | PRN

## 2016-11-04 MED ORDER — MIDAZOLAM HCL 5 MG/5ML IJ SOLN
INTRAMUSCULAR | Status: DC | PRN
  Administered 2016-11-04 (×2): 1 mg via INTRAVENOUS

## 2016-11-04 MED ORDER — METHOCARBAMOL 500 MG PO TABS
500.0000 mg | ORAL_TABLET | Freq: Four times a day (QID) | ORAL | Status: DC | PRN
  Administered 2016-11-04 – 2016-11-06 (×5): 500 mg via ORAL
  Filled 2016-11-04 (×5): qty 1

## 2016-11-04 MED ORDER — TRAMADOL HCL 50 MG PO TABS
50.0000 mg | ORAL_TABLET | Freq: Four times a day (QID) | ORAL | Status: DC | PRN
  Administered 2016-11-06: 50 mg via ORAL
  Filled 2016-11-04: qty 1

## 2016-11-04 MED ORDER — ONDANSETRON HCL 4 MG/2ML IJ SOLN
4.0000 mg | Freq: Four times a day (QID) | INTRAMUSCULAR | Status: DC | PRN
  Administered 2016-11-05: 4 mg via INTRAVENOUS
  Filled 2016-11-04: qty 2

## 2016-11-04 MED ORDER — FENTANYL CITRATE (PF) 100 MCG/2ML IJ SOLN
100.0000 ug | Freq: Once | INTRAMUSCULAR | Status: AC
  Administered 2016-11-04: 100 ug via INTRAVENOUS

## 2016-11-04 MED ORDER — AMLODIPINE BESYLATE 5 MG PO TABS
5.0000 mg | ORAL_TABLET | Freq: Every day | ORAL | Status: DC
  Administered 2016-11-05 – 2016-11-06 (×2): 5 mg via ORAL
  Filled 2016-11-04 (×2): qty 1

## 2016-11-04 MED ORDER — SODIUM CHLORIDE 0.9 % IV SOLN
INTRAVENOUS | Status: DC
  Administered 2016-11-04: 17:00:00 via INTRAVENOUS

## 2016-11-04 MED ORDER — SODIUM CHLORIDE 0.9 % IJ SOLN
INTRAMUSCULAR | Status: DC | PRN
  Administered 2016-11-04: 30 mL

## 2016-11-04 MED ORDER — METOCLOPRAMIDE HCL 5 MG/ML IJ SOLN: 5.0000 mg | Freq: Three times a day (TID) | INTRAMUSCULAR | Status: DC | PRN

## 2016-11-04 SURGICAL SUPPLY — 49 items
BAG DECANTER FOR FLEXI CONT (MISCELLANEOUS) IMPLANT
BAG ZIPLOCK 12X15 (MISCELLANEOUS) ×3 IMPLANT
BANDAGE ACE 6X5 VEL STRL LF (GAUZE/BANDAGES/DRESSINGS) ×3 IMPLANT
BLADE SAG 18X100X1.27 (BLADE) ×3 IMPLANT
BLADE SAW SGTL 11.0X1.19X90.0M (BLADE) ×3 IMPLANT
BOWL SMART MIX CTS (DISPOSABLE) ×3 IMPLANT
CAPT KNEE TOTAL 3 ATTUNE ×3 IMPLANT
CEMENT HV SMART SET (Cement) ×3 IMPLANT
CLOSURE WOUND 1/2 X4 (GAUZE/BANDAGES/DRESSINGS) ×2
COVER SURGICAL LIGHT HANDLE (MISCELLANEOUS) ×3 IMPLANT
CUFF TOURN SGL QUICK 34 (TOURNIQUET CUFF) ×2
CUFF TRNQT CYL 34X4X40X1 (TOURNIQUET CUFF) ×1 IMPLANT
DECANTER SPIKE VIAL GLASS SM (MISCELLANEOUS) ×3 IMPLANT
DRAPE U-SHAPE 47X51 STRL (DRAPES) ×3 IMPLANT
DRSG ADAPTIC 3X8 NADH LF (GAUZE/BANDAGES/DRESSINGS) ×3 IMPLANT
DRSG PAD ABDOMINAL 8X10 ST (GAUZE/BANDAGES/DRESSINGS) ×3 IMPLANT
DURAPREP 26ML APPLICATOR (WOUND CARE) ×3 IMPLANT
ELECT REM PT RETURN 15FT ADLT (MISCELLANEOUS) ×3 IMPLANT
EVACUATOR 1/8 PVC DRAIN (DRAIN) ×3 IMPLANT
GAUZE SPONGE 4X4 12PLY STRL (GAUZE/BANDAGES/DRESSINGS) ×3 IMPLANT
GLOVE BIO SURGEON STRL SZ7.5 (GLOVE) ×3 IMPLANT
GLOVE BIO SURGEON STRL SZ8 (GLOVE) ×6 IMPLANT
GLOVE BIOGEL PI IND STRL 6.5 (GLOVE) IMPLANT
GLOVE BIOGEL PI IND STRL 8 (GLOVE) ×1 IMPLANT
GLOVE BIOGEL PI INDICATOR 6.5 (GLOVE)
GLOVE BIOGEL PI INDICATOR 8 (GLOVE) ×2
GLOVE SURG SS PI 6.5 STRL IVOR (GLOVE) IMPLANT
GOWN STRL REUS W/TWL LRG LVL3 (GOWN DISPOSABLE) ×3 IMPLANT
GOWN STRL REUS W/TWL XL LVL3 (GOWN DISPOSABLE) ×3 IMPLANT
HANDPIECE INTERPULSE COAX TIP (DISPOSABLE) ×2
IMMOBILIZER KNEE 20 (SOFTGOODS) ×3
IMMOBILIZER KNEE 20 THIGH 36 (SOFTGOODS) ×1 IMPLANT
MANIFOLD NEPTUNE II (INSTRUMENTS) ×3 IMPLANT
NS IRRIG 1000ML POUR BTL (IV SOLUTION) ×3 IMPLANT
PACK TOTAL KNEE CUSTOM (KITS) ×3 IMPLANT
PADDING CAST COTTON 6X4 STRL (CAST SUPPLIES) ×6 IMPLANT
POSITIONER SURGICAL ARM (MISCELLANEOUS) ×3 IMPLANT
SET HNDPC FAN SPRY TIP SCT (DISPOSABLE) ×1 IMPLANT
STRIP CLOSURE SKIN 1/2X4 (GAUZE/BANDAGES/DRESSINGS) ×4 IMPLANT
SUT MNCRL AB 4-0 PS2 18 (SUTURE) ×3 IMPLANT
SUT STRATAFIX 0 PDS 27 VIOLET (SUTURE) ×3
SUT VIC AB 2-0 CT1 27 (SUTURE) ×6
SUT VIC AB 2-0 CT1 TAPERPNT 27 (SUTURE) ×3 IMPLANT
SUTURE STRATFX 0 PDS 27 VIOLET (SUTURE) ×1 IMPLANT
SYR 50ML LL SCALE MARK (SYRINGE) IMPLANT
TRAY FOLEY W/METER SILVER 16FR (SET/KITS/TRAYS/PACK) ×3 IMPLANT
WATER STERILE IRR 1000ML POUR (IV SOLUTION) ×6 IMPLANT
WRAP KNEE MAXI GEL POST OP (GAUZE/BANDAGES/DRESSINGS) ×3 IMPLANT
YANKAUER SUCT BULB TIP 10FT TU (MISCELLANEOUS) ×3 IMPLANT

## 2016-11-04 NOTE — Op Note (Signed)
OPERATIVE REPORT-TOTAL KNEE ARTHROPLASTY   Pre-operative diagnosis- Osteoarthritis  Right knee(s)  Post-operative diagnosis- Osteoarthritis Right knee(s)  Procedure-  Right  Total Knee Arthroplasty  Surgeon- Gus Rankin. Annalei Friesz, MD  Assistant- [name], PA-C   Anesthesia-  Adductor canal block and spinal  EBL-* No blood loss amount entered *   Drains Hemovac  Tourniquet time-  Total Tourniquet Time Documented: Thigh (Right) - 37 minutes Total: Thigh (Right) - 37 minutes     Complications- None  Condition-PACU - hemodynamically stable.   Brief Clinical Note  Jerry [name] is a 61 y.o. year old male with end stage OA of his right knee with progressively worsening pain and dysfunction. He has constant pain, with activity and at rest and significant functional deficits with difficulties even with ADLs. He has had extensive non-op management including analgesics, injections of cortisone and viscosupplements, and home exercise program, but remains in significant pain with significant dysfunction. Radiographs show bone on bone arthritis medial. He presents now for right Total Knee Arthroplasty.    Procedure in detail---   The patient is brought into the operating room and positioned supine on the operating table. After successful administration of  Adductor canal block and spinal,   a tourniquet is placed high on the  Right thigh(s) and the lower extremity is prepped and draped in the usual sterile fashion. Time out is performed by the operating team and then the  Right lower extremity is wrapped in Esmarch, knee flexed and the tourniquet inflated to 300 mmHg.       A midline incision is made with a ten blade through the subcutaneous tissue to the level of the extensor mechanism. A fresh blade is used to make a medial parapatellar arthrotomy. Soft tissue over the proximal medial tibia is subperiosteally elevated to the joint line with a knife and into the semimembranosus bursa with  a Cobb elevator. Soft tissue over the proximal lateral tibia is elevated with attention being paid to avoiding the patellar tendon on the tibial tubercle. The patella is everted, knee flexed 90 degrees and the ACL and PCL are removed. Findings are bone on bone medial and patellofemoral with large global osteophytes.        The drill is used to create a starting hole in the distal femur and the canal is thoroughly irrigated with sterile saline to remove the fatty contents. The 5 degree Right  valgus alignment guide is placed into the femoral canal and the distal femoral cutting block is pinned to remove 10 mm off the distal femur. Resection is made with an oscillating saw.      The tibia is subluxed forward and the menisci are removed. The extramedullary alignment guide is placed referencing proximally at the medial aspect of the tibial tubercle and distally along the second metatarsal axis and tibial crest. The block is pinned to remove 65mm off the more deficient medial  side. Resection is made with an oscillating saw. Size 7is the most appropriate size for the tibia and the proximal tibia is prepared with the modular drill and keel punch for that size.      The femoral sizing guide is placed and size 7 is most appropriate. Rotation is marked off the epicondylar axis and confirmed by creating a rectangular flexion gap at 90 degrees. The size 7 cutting block is pinned in this rotation and the anterior, posterior and chamfer cuts are made with the oscillating saw. The intercondylar block is then placed and that cut  is made.      Trial size 7 tibial component, trial size 7 posterior stabilized femur and a 8  mm posterior stabilized rotating platform insert trial is placed. Full extension is achieved with excellent varus/valgus and anterior/posterior balance throughout full range of motion. The patella is everted and thickness measured to be 27  mm. Free hand resection is taken to 15 mm, a 41 template is placed, lug  holes are drilled, trial patella is placed, and it tracks normally. Osteophytes are removed off the posterior femur with the trial in place. All trials are removed and the cut bone surfaces prepared with pulsatile lavage. Cement is mixed and once ready for implantation, the size 7 tibial implant, size  7 posterior stabilized femoral component, and the size 41 patella are cemented in place and the patella is held with the clamp. The trial insert is placed and the knee held in full extension. The Exparel (20 ml mixed with 30 ml saline) is injected into the extensor mechanism, posterior capsule, medial and lateral gutters and subcutaneous tissues.  All extruded cement is removed and once the cement is hard the permanent 8 mm posterior stabilized rotating platform insert is placed into the tibial tray.      The wound is copiously irrigated with saline solution and the extensor mechanism closed over a hemovac drain with #1 V-loc suture. The tourniquet is released for a total tourniquet time of 37  minutes. Flexion against gravity is 140 degrees and the patella tracks normally. Subcutaneous tissue is closed with 2.0 vicryl and subcuticular with running 4.0 Monocryl. The incision is cleaned and dried and steri-strips and a bulky sterile dressing are applied. The limb is placed into a knee immobilizer and the patient is awakened and transported to recovery in stable condition.      Please note that a surgical assistant was a medical necessity for this procedure in order to perform it in a safe and expeditious manner. Surgical assistant was necessary to retract the ligaments and vital neurovascular structures to prevent injury to them and also necessary for proper positioning of the limb to allow for anatomic placement of the prosthesis.   Gus Rankin Jerry Strycharz, MD    11/04/2016, 1:39 PM

## 2016-11-04 NOTE — Progress Notes (Signed)
Pt's CBG is 314. On-call PA [employer] notified. Ordered HS insulin coverage. Also, pt having unrelieved pain. Morphine IV given and too soon for oxycodone. Received orders for Dilaudid IV 0.5-1mg  every 2-3 hours PRN for breakthrough pain. Will continue to monitor.

## 2016-11-04 NOTE — Transfer of Care (Signed)
Immediate Anesthesia Transfer of Care Note  Patient: Jerry [name]  Procedure(s) Performed: Procedure(s) with comments: RIGHT TOTAL KNEE ARTHROPLASTY (Right) - with abductor block  Patient Location: PACU  Anesthesia Type:Spinal  Level of Consciousness: awake, alert , oriented and patient cooperative  Airway & Oxygen Therapy: Patient Spontanous Breathing and Patient connected to face mask oxygen  Post-op Assessment: Report given to RN and Post -op Vital signs reviewed and stable  Post vital signs: stable  Last Vitals:  Vitals:   11/04/16 1157 11/04/16 1206  BP: (!) 143/96 (!) 150/98  Pulse: 78 79  Resp: 12 20    Last Pain:  Vitals:   11/04/16 1031  PainSc: 8          Complications: No apparent anesthesia complications

## 2016-11-04 NOTE — Anesthesia Preprocedure Evaluation (Addendum)
Anesthesia Evaluation  Patient identified by MRN, date of birth, ID band Patient awake    Reviewed: Allergy & Precautions, NPO status , Patient's Chart, lab work & pertinent test results, reviewed documented beta blocker date and time   History of Anesthesia Complications Negative for: history of anesthetic complications  Airway Mallampati: II  TM Distance: >3 FB Neck ROM: Full    Dental  (+) Dental Advisory Given   Pulmonary COPD, former smoker,    breath sounds clear to auscultation       Cardiovascular hypertension, Pt. on medications and Pt. on home beta blockers (-) angina Rhythm:Regular Rate:Normal  '14 myoview: moderate risk study with mixed ischemia in the inferior wall distribution and ischemia in the distal anterior wall. LV function is moderately reduced with EF of 43% '14 cath: normal coronaries   Neuro/Psych negative neurological ROS     GI/Hepatic negative GI ROS, Neg liver ROS,   Endo/Other  diabetes (glu 259), Insulin Dependent, Oral Hypoglycemic AgentsMorbid obesity  Renal/GU negative Renal ROS     Musculoskeletal  (+) Arthritis , Osteoarthritis,    Abdominal (+) + obese,   Peds  Hematology negative hematology ROS (+)   Anesthesia Other Findings   Reproductive/Obstetrics                            Anesthesia Physical Anesthesia Plan  ASA: III  Anesthesia Plan: Spinal   Post-op Pain Management:  Regional for Post-op pain   Induction:   Airway Management Planned: Natural Airway and Nasal Cannula  Additional Equipment:   Intra-op Plan:   Post-operative Plan:   Informed Consent: I have reviewed the patients History and Physical, chart, labs and discussed the procedure including the risks, benefits and alternatives for the proposed anesthesia with the patient or authorized representative who has indicated his/her understanding and acceptance.   Dental advisory  given  Plan Discussed with: CRNA and Surgeon  Anesthesia Plan Comments: (Plan routine monitors, SAB with adductor canal block for post op analgesia)        Anesthesia Quick Evaluation

## 2016-11-04 NOTE — Anesthesia Procedure Notes (Signed)
Spinal  Patient location during procedure: OR End time: 11/04/2016 12:36 PM Staffing Resident/CRNA: Enrigue Catena E Performed: resident/CRNA  Preanesthetic Checklist Completed: patient identified, site marked, surgical consent, pre-op evaluation, timeout performed, IV checked, risks and benefits discussed and monitors and equipment checked Spinal Block Patient position: sitting Prep: DuraPrep Patient monitoring: heart rate, continuous pulse ox and blood pressure Approach: left paramedian Location: L3-4 Injection technique: single-shot Needle Needle type: Pencan  Needle gauge: 24 G Needle length: 9 cm Assessment Sensory level: T6 Additional Notes Expiration date of kit checked and confirmed. Patient tolerated procedure well, without complications.

## 2016-11-04 NOTE — Anesthesia Procedure Notes (Signed)
Anesthesia Regional Block: Adductor canal block   Pre-Anesthetic Checklist: ,, timeout performed, Correct Patient, Correct Site, Correct Laterality, Correct Procedure, Correct Position, site marked, Risks and benefits discussed,  Surgical consent,  Pre-op evaluation,  At surgeon's request and post-op pain management  Laterality: Right and Lower  Prep: chloraprep       Needles:   Needle Type: Echogenic Needle     Needle Length: 9cm  Needle Gauge: 21     Additional Needles:   Procedures: ultrasound guided,,,,,,,,  Narrative:  Start time: 11/04/2016 11:35 AM End time: 11/04/2016 11:42 AM Injection made incrementally with aspirations every 5 mL.  Performed by: Personally  Anesthesiologist: Jean Rosenthal, Zalika Tieszen  Additional Notes: Pt identified in Holding room.  Monitors applied. Working IV access confirmed. Sterile prep R thjigh.  #21ga ECHOgenic needle into adductor canal with US guidance.  30cc 0.5% Ropivacaine injected incrementally after negative test dose.  Patient asymptomatic, VSS, no heme aspirated, tolerated well.  Sandford Craze, MD

## 2016-11-04 NOTE — Interval H&P Note (Signed)
History and Physical Interval Note:  11/04/2016 10:36 AM  Jerry [name]  has presented today for surgery, with the diagnosis of Osteoarthritis Right knee   The various methods of treatment have been discussed with the patient and family. After consideration of risks, benefits and other options for treatment, the patient has consented to  Procedure(s): RIGHT TOTAL KNEE ARTHROPLASTY (Right) as a surgical intervention .  The patient's history has been reviewed, patient examined, no change in status, stable for surgery.  I have reviewed the patient's chart and labs.  Questions were answered to the patient's satisfaction.     Loanne Drilling

## 2016-11-04 NOTE — H&P (View-Only) (Signed)
Jerry [name] DOB: 08-11-1955 Married / Language: English / Race: Black or African American Male Date of Admission:  11/04/16 CC:  Right knee pain History of Present Illness  The patient is a 61 year old male who comes in for a preoperative History and Physical. The patient is scheduled for a right total knee arthroplasty to be performed by Dr. Gus Rankin. Aluisio, MD at [facility] on 11-04-2016. The patient is a 61 year old male who presented for follow up of their knee. The patient is being followed for their right knee pain and osteoarthritis. They are now months out from cortisone injection. Symptoms reported include: pain, swelling, aching and difficulty ambulating. The patient feels that they are doing poorly and report their pain level to be severe. The following medication has been used for pain control: none (He reports tramadol made him constipated, but did not help with pain even after taking 3-4 at a time). The patient has not gotten any relief of their symptoms with Cortisone injections. The right knee has gotten progressively worse to the point where he cannot function well any more. He did great with his left knee replacement once he got past the first few months. He has no problems with the left knee now. Right knee is as bad as the left one was when he had it replaced. It is hurting with all activities even hurting at rest. It is limiting what he can and cannot do. He is ready to get the right knee fixed at this time. They have been treated conservatively in the past for the above stated problem and despite conservative measures, they continue to have progressive pain and severe functional limitations and dysfunction. They have failed non-operative management including home exercise, medications, and injections. It is felt that they would benefit from undergoing total joint replacement. Risks and benefits of the procedure have been discussed with the patient and they elect to  proceed with surgery. There are no active contraindications to surgery such as ongoing infection or rapidly progressive neurological disease.  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  Family History First Degree Relatives  reported Diabetes Mellitus  mother Hypertension  mother Rheumatoid Arthritis  mother  Social History  Tobacco use  current some days smoker; smoke(d) 1 pack(s) per day Tobacco / smoke exposure  yes outdoors only Marital status  married Living situation  live with spouse Pain Contract  no Number of flights of stairs before winded  1 Drug/Alcohol Rehab (Previously)  no Drug/Alcohol Rehab (Currently)  no Illicit drug use  no Exercise  Exercises daily; does other Children  4 Alcohol use  current drinker; drinks beer; only occasionally per week Current work status  disabled Current some day smoker   Medication History  TraMADol HCl (50MG  Tablet, 1-2 Oral every 6 hours as needed for painVitamin B-12 Active. HydroCHLOROthiazide (12.5MG  Tablet, Oral) Active. Atorvastatin Calcium (40MG  Tablet, Oral) Active. MetFORMIN HCl (850MG  Tablet, Oral) Active. Lantus SoloStar (100UNIT/ML Solution, Subcutaneous) Active. Aspirin (81MG  Tablet, 1 (one) Oral) Active.  Past Surgical History  Arthroscopy of Knee  left Total Knee Replacement -  Left [10/04/2015]: Appendectomy  Right Arm Surgery   Review of Systems General Not Present- Chills, Fatigue, Fever, Memory Loss, Night Sweats, Weight Gain and  Weight Loss. Skin Not Present- Eczema, Hives, Itching, Lesions and Rash. HEENT Not Present- Dentures, Double Vision, Headache, Hearing Loss, Tinnitus and Visual Loss. Respiratory Not Present- Allergies, Chronic Cough, Coughing up blood, Shortness of breath at rest and Shortness of breath with exertion. Cardiovascular Not Present- Chest Pain, Difficulty Breathing Lying Down, Murmur, Palpitations, Racing/skipping heartbeats and Swelling. Gastrointestinal Not Present- Abdominal Pain, Bloody Stool, Constipation, Diarrhea, Difficulty Swallowing, Heartburn, Jaundice, Loss of appetitie, Nausea and Vomiting. Male Genitourinary Not Present- Blood in Urine, Discharge, Flank Pain, Incontinence, Painful Urination, Urgency, Urinary frequency, Urinary Retention, Urinating at Night and Weak urinary stream. Musculoskeletal Present- Joint Pain. Not Present- Back Pain, Joint Swelling, Morning Stiffness, Muscle Pain, Muscle Weakness and Spasms. Neurological Not Present- Blackout spells, Difficulty with balance, Dizziness, Paralysis, Tremor and Weakness. Psychiatric Not Present- Insomnia.  Vitals Weight: 283 lb Height: 76in Weight was reported by patient. Height was reported by patient. Body Surface Area: 2.57 m Body Mass Index: 34.45 kg/m  Pulse: 84 (Regular)  BP: 142/76 (Sitting, Right Arm, Standard)  Physical Exam  General Mental Status -Alert, cooperative and good historian. General Appearance-pleasant, Not in acute distress. Orientation-Oriented X3. Build & Nutrition-Well nourished and Well developed.  Head and Neck Head-normocephalic, atraumatic . Neck Global Assessment - supple, no bruit auscultated on the right, no bruit auscultated on the left.  Eye Pupil - Bilateral-Regular and Round. Motion - Bilateral-EOMI.  ENMT Note: dentures   Chest and Lung Exam Auscultation Breath sounds - clear at anterior chest wall and clear at posterior chest wall. Adventitious  sounds - No Adventitious sounds.  Cardiovascular Auscultation Rhythm - Regular rate and rhythm. Heart Sounds - S1 WNL and S2 WNL. Murmurs & Other Heart Sounds - Auscultation of the heart reveals - No Murmurs.  Abdomen Inspection Contour - Generalized moderate distention. Palpation/Percussion Tenderness - Abdomen is non-tender to palpation. Rigidity (guarding) - Abdomen is soft. Auscultation Auscultation of the abdomen reveals - Bowel sounds normal.  Male Genitourinary Note: Not done, not pertinent to present illness   Musculoskeletal Note: On exam, he is in no distress. Evaluation of his left knee shows no swelling. Range 0 to 120 with no tenderness or instability. Right knee, slight varus, no effusion. Range 5 to 125. He does not have any instability about that knee. He is diffusely tender around the right knee.  RADIOGRAPHS His radiographs, AP and lateral of both knees are reviewed today and his prosthesis on the left is in excellent position, no abnormalities. On the right, he has bone-on-bone arthritis medial and near bone-on-bone arthritis patellofemoral in that right knee.  Assessment & Plan Primary osteoarthritis of right knee (M17.11) Status post total left knee replacement using cement (B34.193)  Note:Surgical Plans: Right Total Knee Replacement  Disposition: Home with HHPT  PCP: Dr. Victorino Dike Cowllard  IV TXA  Anesthesia Issues: None  Patient was instructed on what medications to stop prior to surgery.  Signed electronically by Lauraine Rinne, III PA-C

## 2016-11-04 NOTE — Anesthesia Postprocedure Evaluation (Signed)
Anesthesia Post Note  Patient: Jerry [name]  Procedure(s) Performed: Procedure(s) (LRB): RIGHT TOTAL KNEE ARTHROPLASTY (Right)  Patient location during evaluation: PACU Anesthesia Type: Spinal Level of consciousness: awake and alert, oriented and patient cooperative Pain management: pain level controlled Vital Signs Assessment: post-procedure vital signs reviewed and stable Respiratory status: spontaneous breathing, nonlabored ventilation, patient connected to nasal cannula oxygen and respiratory function stable Cardiovascular status: blood pressure returned to baseline and stable Postop Assessment: spinal receding, patient able to bend at knees and no signs of nausea or vomiting Anesthetic complications: no       Last Vitals:  Vitals:   11/04/16 1605 11/04/16 1705  BP: (!) 156/91 (!) 171/95  Pulse: 86 92  Resp: 14 14  Temp: 36.4 C     Last Pain:  Vitals:   11/04/16 1541  PainSc: 3                  Nazifa Trinka,E. Maronda Caison

## 2016-11-04 NOTE — Progress Notes (Signed)
AssistedDr. Carswell Jackson with right, ultrasound guided, adductor canal block. Side rails up, monitors on throughout procedure. See vital signs in flow sheet. Tolerated Procedure well.  

## 2016-11-05 LAB — BASIC METABOLIC PANEL
ANION GAP: 7 (ref 5–15)
BUN: 12 mg/dL (ref 6–20)
CALCIUM: 8.9 mg/dL (ref 8.9–10.3)
CO2: 26 mmol/L (ref 22–32)
Chloride: 99 mmol/L — ABNORMAL LOW (ref 101–111)
Creatinine, Ser: 0.8 mg/dL (ref 0.61–1.24)
GFR calc non Af Amer: >60 mL/min (ref >60)
Glucose, Bld: 268 mg/dL — ABNORMAL HIGH (ref 65–99)
POTASSIUM: 4.4 mmol/L (ref 3.5–5.1)
SODIUM: 132 mmol/L — AB (ref 135–145)

## 2016-11-05 LAB — GLUCOSE, CAPILLARY
GLUCOSE-CAPILLARY: 303 mg/dL — AB (ref 65–99)
Glucose-Capillary: 255 mg/dL — ABNORMAL HIGH (ref 65–99)
Glucose-Capillary: 298 mg/dL — ABNORMAL HIGH (ref 65–99)
Glucose-Capillary: 256 mg/dL — ABNORMAL HIGH (ref 65–99)
Glucose-Capillary: 302 mg/dL — ABNORMAL HIGH (ref 65–99)

## 2016-11-05 LAB — CBC
HCT: 32.1 % — ABNORMAL LOW (ref 39.0–52.0)
Hemoglobin: 11.5 g/dL — ABNORMAL LOW (ref 13.0–17.0)
MCH: 33.5 pg (ref 26.0–34.0)
MCHC: 35.8 g/dL (ref 30.0–36.0)
MCV: 93.6 fL (ref 78.0–100.0)
PLATELETS: 233 10*3/uL (ref 150–400)
RBC: 3.43 MIL/uL — AB (ref 4.22–5.81)
RDW: 12 % (ref 11.5–15.5)
WBC: 11.5 10*3/uL — AB (ref 4.0–10.5)

## 2016-11-05 MED ORDER — OXYCODONE HCL 5 MG PO TABS
5.0000 mg | ORAL_TABLET | ORAL | Status: DC | PRN
  Administered 2016-11-05 – 2016-11-06 (×7): 15 mg via ORAL
  Filled 2016-11-05 (×2): qty 3
  Filled 2016-11-05: qty 2
  Filled 2016-11-05 (×5): qty 3

## 2016-11-05 MED ORDER — ALUM & MAG HYDROXIDE-SIMETH 200-200-20 MG/5ML PO SUSP
30.0000 mL | ORAL | Status: DC | PRN
  Administered 2016-11-05 (×2): 30 mL via ORAL
  Filled 2016-11-05 (×2): qty 30

## 2016-11-05 MED ORDER — OXYCODONE HCL 5 MG PO TABS: 5.0000 mg | ORAL_TABLET | ORAL | 0 refills | Status: DC | PRN

## 2016-11-05 MED ORDER — METHOCARBAMOL 500 MG PO TABS: 500.0000 mg | ORAL_TABLET | Freq: Four times a day (QID) | ORAL | 0 refills | Status: DC | PRN

## 2016-11-05 MED ORDER — RIVAROXABAN 10 MG PO TABS: 10.0000 mg | ORAL_TABLET | Freq: Every day | ORAL | 0 refills | Status: DC

## 2016-11-05 MED ORDER — TRAMADOL HCL 50 MG PO TABS: 50.0000 mg | ORAL_TABLET | Freq: Four times a day (QID) | ORAL | 0 refills | Status: DC | PRN

## 2016-11-05 NOTE — Evaluation (Signed)
Physical Therapy Evaluation Patient Details Name: Jerry [name] MRN: [id number] DOB: 11-19-1955 Today's Date: 11/05/2016   History of Present Illness  Pt is a 61 year old male s/p L TKA with hx of R TKA  Clinical Impression  Pt is s/p TKA resulting in the deficits listed below (see PT Problem List).  Pt will benefit from skilled PT to increase their independence and safety with mobility to allow discharge to the venue listed below.  Pt assisted with ambulating short distance in hallway POD #1.  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  Pt will likely progress well.     Follow Up Recommendations Home health PT    Equipment Recommendations  None recommended by PT    Recommendations for Other Services       Precautions / Restrictions Precautions Precautions: Fall;Knee Required Braces or Orthoses: Knee Immobilizer - Right Restrictions Other Position/Activity Restrictions: WBAT      Mobility  Bed Mobility Overal bed mobility: Needs Assistance Bed Mobility: Supine to Sit;Sit to Supine     Supine to sit: Supervision;HOB elevated Sit to supine: Min assist;HOB elevated   General bed mobility comments: pt able to self assist R LE of bed, assist for support for R LE onto bed  Transfers Overall transfer level: Needs assistance Equipment used: Rolling walker (2 wheeled) Transfers: Sit to/from Stand Sit to Stand: Min guard         General transfer comment: verbal cues for safe technique  Ambulation/Gait Ambulation/Gait assistance: Min guard Ambulation Distance (Feet): 60 Feet Assistive device: Rolling walker (2 wheeled) Gait Pattern/deviations: Step-to pattern     General Gait Details: verbal cues for sequence and short steps for pain control, distance to pt tolerance  Stairs            Wheelchair Mobility    Modified Rankin (Stroke Patients Only)       Balance                                             Pertinent Vitals/Pain  Pain Assessment: 0-10 Pain Score: 7  Pain Location: R knee/thigh Pain Descriptors / Indicators: Sore;Aching Pain Intervention(s): Limited activity within patient's tolerance;Repositioned;Monitored during session;RN gave pain meds during session;Ice applied    Home Living Family/patient expects to be discharged to:: Private residence Living Arrangements: Spouse/significant other   Type of Home: House Home Access: Stairs to enter Entrance Stairs-Rails: Can reach both Entrance Stairs-Number of Steps: 3 Home Layout: One level Home Equipment: Walker - 2 wheels;Cane - single point      Prior Function Level of Independence: Independent               Hand Dominance        Extremity/Trunk Assessment        Lower Extremity Assessment Lower Extremity Assessment: RLE deficits/detail RLE Deficits / Details: unable to perform SLR, R knee flexion AAROM approx 50* supine       Communication   Communication: No difficulties  Cognition Arousal/Alertness: Awake/alert Behavior During Therapy: WFL for tasks assessed/performed Overall Cognitive Status: Within Functional Limits for tasks assessed                                        General Comments  Exercises     Assessment/Plan    PT Assessment Patient needs continued PT services  PT Problem List Decreased strength;Decreased range of motion;Decreased mobility;Pain       PT Treatment Interventions Functional mobility training;Stair training;Gait training;DME instruction;Therapeutic activities;Therapeutic exercise;Patient/family education    PT Goals (Current goals can be found in the Care Plan section)  Acute Rehab PT Goals PT Goal Formulation: With patient Time For Goal Achievement: 11/08/16 Potential to Achieve Goals: Good    Frequency 7X/week   Barriers to discharge        Co-evaluation               AM-PAC PT "6 Clicks" Daily Activity  Outcome Measure Difficulty turning over  in bed (including adjusting bedclothes, sheets and blankets)?: None Difficulty moving from lying on back to sitting on the side of the bed? : A Little Difficulty sitting down on and standing up from a chair with arms (e.g., wheelchair, bedside commode, etc,.)?: A Little Help needed moving to and from a bed to chair (including a wheelchair)?: A Little Help needed walking in hospital room?: A Little Help needed climbing 3-5 steps with a railing? : A Little 6 Click Score: 19    End of Session Equipment Utilized During Treatment: Gait belt;Right knee immobilizer Activity Tolerance: Patient limited by pain Patient left: in bed;with call bell/phone within reach;with family/visitor present Nurse Communication: Mobility status PT Visit Diagnosis: Other abnormalities of gait and mobility (R26.89);Pain Pain - Right/Left: Right Pain - part of body: Knee    Time: 3267-1245 PT Time Calculation (min) (ACUTE ONLY): 24 min   Charges:   PT Evaluation $PT Eval Low Complexity: 1 Procedure     PT G Codes:        Zenovia Jarred, PT, DPT 11/05/2016 Pager: [phone]   Maida Sale E 11/05/2016, 12:44 PM

## 2016-11-05 NOTE — Progress Notes (Signed)
OT Cancellation Note  Patient Details Name: TORIAN QUINTERO MRN: [id number] DOB: February 22, 1956   Cancelled Treatment:    Reason Eval/Treat Not Completed: OT screened, no needs identified, will sign off.  Pt has a h/o  L TKA; wife will assist at home.  No OT needs  Avonell Lenig 11/05/2016, 10:04 AM  Marica Otter, OTR/L [phone] 11/05/2016

## 2016-11-05 NOTE — Progress Notes (Signed)
Discharge planning, spoke with patient and spouse at bedside. Have chosen [facility] for [facility]. Contacted [facility] for referral. Has RW, requesting 3n1, contacted [facility] to deliver to room. [phone]

## 2016-11-05 NOTE — Discharge Instructions (Addendum)
°  ° °Dr. Frank Aluisio °Total Joint Specialist °[facility] °[address] °[place], [place] [address] °[phone] ° °TOTAL KNEE REPLACEMENT POSTOPERATIVE DIRECTIONS ° °Knee Rehabilitation, Guidelines Following Surgery  °Results after knee surgery are often greatly improved when you follow the exercise, range of motion and muscle strengthening exercises prescribed by your doctor. Safety measures are also important to protect the knee from further injury. Any time any of these exercises cause you to have increased pain or swelling in your knee joint, decrease the amount until you are comfortable again and slowly increase them. If you have problems or questions, call your caregiver or physical therapist for advice.  ° °HOME CARE INSTRUCTIONS  °Remove items at home which could result in a fall. This includes throw rugs or furniture in walking pathways.  °· ICE to the affected knee every three hours for 30 minutes at a time and then as needed for pain and swelling.  Continue to use ice on the knee for pain and swelling from surgery. You may notice swelling that will progress down to the foot and ankle.  This is normal after surgery.  Elevate the leg when you are not up walking on it.   °· Continue to use the breathing machine which will help keep your temperature down.  It is common for your temperature to cycle up and down following surgery, especially at night when you are not up moving around and exerting yourself.  The breathing machine keeps your lungs expanded and your temperature down. °· Do not place pillow under knee, focus on keeping the knee straight while resting ° °DIET °You may resume your previous home diet once your are discharged from the hospital. ° °DRESSING / WOUND CARE / SHOWERING °You may shower 3 days after surgery, but keep the wounds dry during showering.  You may use an occlusive plastic wrap (Press'n Seal for example), NO SOAKING/SUBMERGING IN THE BATHTUB.  If the  bandage gets wet, change with a clean dry gauze.  If the incision gets wet, pat the wound dry with a clean towel. °You may start showering once you are discharged home but do not submerge the incision under water. Just pat the incision dry and apply a dry gauze dressing on daily. °Change the surgical dressing daily and reapply a dry dressing each time. ° °ACTIVITY °Walk with your walker as instructed. °Use walker as long as suggested by your caregivers. °Avoid periods of inactivity such as sitting longer than an hour when not asleep. This helps prevent blood clots.  °You may resume a sexual relationship in one month or when given the OK by your doctor.  °You may return to work once you are cleared by your doctor.  °Do not drive a car for 6 weeks or until released by you surgeon.  °Do not drive while taking narcotics. ° °WEIGHT BEARING °Weight bearing as tolerated with assist device (walker, cane, etc) as directed, use it as long as suggested by your surgeon or therapist, typically at least 4-6 weeks. ° °POSTOPERATIVE CONSTIPATION PROTOCOL °Constipation - defined medically as fewer than three stools per week and severe constipation as less than one stool per week. ° °One of the most common issues patients have following surgery is constipation.  Even if you have a regular bowel pattern at home, your normal regimen is likely to be disrupted due to multiple reasons following surgery.  Combination of anesthesia, postoperative narcotics, change in appetite and fluid intake all can affect your   bowels.  In order to avoid complications following surgery, here are some recommendations in order to help you during your recovery period. ° °Colace (docusate) - Pick up an over-the-counter form of Colace or another stool softener and take twice a day as long as you are requiring postoperative pain medications.  Take with a full glass of water daily.  If you experience loose stools or diarrhea, hold the colace until you stool forms  back up.  If your symptoms do not get better within 1 week or if they get worse, check with your doctor. ° °Dulcolax (bisacodyl) - Pick up over-the-counter and take as directed by the product packaging as needed to assist with the movement of your bowels.  Take with a full glass of water.  Use this product as needed if not relieved by Colace only.  ° °MiraLax (polyethylene glycol) - Pick up over-the-counter to have on hand.  MiraLax is a solution that will increase the amount of water in your bowels to assist with bowel movements.  Take as directed and can mix with a glass of water, juice, soda, coffee, or tea.  Take if you go more than two days without a movement. °Do not use MiraLax more than once per day. Call your doctor if you are still constipated or irregular after using this medication for 7 days in a row. ° °If you continue to have problems with postoperative constipation, please contact the office for further assistance and recommendations.  If you experience "the worst abdominal pain ever" or develop nausea or vomiting, please contact the office immediatly for further recommendations for treatment. ° °ITCHING ° If you experience itching with your medications, try taking only a single pain pill, or even half a pain pill at a time.  You can also use Benadryl over the counter for itching or also to help with sleep.  ° °TED HOSE STOCKINGS °Wear the elastic stockings on both legs for three weeks following surgery during the day but you may remove then at night for sleeping. ° °MEDICATIONS °See your medication summary on the “After Visit Summary” that the nursing staff will review with you prior to discharge.  You may have some home medications which will be placed on hold until you complete the course of blood thinner medication.  It is important for you to complete the blood thinner medication as prescribed by your surgeon.  Continue your approved medications as instructed at time of  discharge. ° °PRECAUTIONS °If you experience chest pain or shortness of breath - call 911 immediately for transfer to the hospital emergency department.  °If you develop a fever greater that 101 F, purulent drainage from wound, increased redness or drainage from wound, foul odor from the wound/dressing, or calf pain - CONTACT YOUR SURGEON.   °                                                °FOLLOW-UP APPOINTMENTS °Make sure you keep all of your appointments after your operation with your surgeon and caregivers. You should call the office at the above phone number and make an appointment for approximately two weeks after the date of your surgery or on the date instructed by your surgeon outlined in the "After Visit Summary". ° ° °RANGE OF MOTION AND STRENGTHENING EXERCISES  °Rehabilitation of the knee is important following a knee   injury or an operation. After just a few days of immobilization, the muscles of the thigh which control the knee become weakened and shrink (atrophy). Knee exercises are designed to build up the tone and strength of the thigh muscles and to improve knee motion. Often times heat used for twenty to thirty minutes before working out will loosen up your tissues and help with improving the range of motion but do not use heat for the first two weeks following surgery. These exercises can be done on a training (exercise) mat, on the floor, on a table or on a bed. Use what ever works the best and is most comfortable for you Knee exercises include:  °Leg Lifts - While your knee is still immobilized in a splint or cast, you can do straight leg raises. Lift the leg to 60 degrees, hold for 3 sec, and slowly lower the leg. Repeat 10-20 times 2-3 times daily. Perform this exercise against resistance later as your knee gets better.  °Quad and Hamstring Sets - Tighten up the muscle on the front of the thigh (Quad) and hold for 5-10 sec. Repeat this 10-20 times hourly. Hamstring sets are done by pushing the  foot backward against an object and holding for 5-10 sec. Repeat as with quad sets.  °· Leg Slides: Lying on your back, slowly slide your foot toward your buttocks, bending your knee up off the floor (only go as far as is comfortable). Then slowly slide your foot back down until your leg is flat on the floor again. °· Angel Wings: Lying on your back spread your legs to the side as far apart as you can without causing discomfort.  °A rehabilitation program following serious knee injuries can speed recovery and prevent re-injury in the future due to weakened muscles. Contact your doctor or a physical therapist for more information on knee rehabilitation.  ° °IF YOU ARE TRANSFERRED TO A SKILLED REHAB FACILITY °If the patient is transferred to a skilled rehab facility following release from the hospital, a list of the current medications will be sent to the facility for the patient to continue.  When discharged from the skilled rehab facility, please have the facility set up the patient's Home Health Physical Therapy prior to being released. Also, the skilled facility will be responsible for providing the patient with their medications at time of release from the facility to include their pain medication, the muscle relaxants, and their blood thinner medication. If the patient is still at the rehab facility at time of the two week follow up appointment, the skilled rehab facility will also need to assist the patient in arranging follow up appointment in our office and any transportation needs. ° °MAKE SURE YOU:  °Understand these instructions.  °Get help right away if you are not doing well or get worse.  ° ° °Pick up stool softner and laxative for home use following surgery while on pain medications. °Do not submerge incision under water. °Please use good hand washing techniques while changing dressing each day. °May shower starting three days after surgery. °Please use a clean towel to pat the incision dry following  showers. °Continue to use ice for pain and swelling after surgery. °Do not use any lotions or creams on the incision until instructed by your surgeon. ° °Take Xarelto for two and a half more weeks following discharge from the hospital, then discontinue Xarelto. °Once the patient has completed the blood thinner regimen, then take a Baby 81 mg Aspirin daily   for three more weeks. ° ° °Information on my medicine - XARELTO® (Rivaroxaban) ° ° °Why was Xarelto® prescribed for you? °Xarelto® was prescribed for you to reduce the risk of blood clots forming after orthopedic surgery. The medical term for these abnormal blood clots is venous thromboembolism (VTE). ° °What do you need to know about xarelto® ? °Take your Xarelto® ONCE DAILY at the same time every day. °You may take it either with or without food. ° °If you have difficulty swallowing the tablet whole, you may crush it and mix in applesauce just prior to taking your dose. ° °Take Xarelto® exactly as prescribed by your doctor and DO NOT stop taking Xarelto® without talking to the doctor who prescribed the medication.  Stopping without other VTE prevention medication to take the place of Xarelto® may increase your risk of developing a clot. ° °After discharge, you should have regular check-up appointments with your healthcare provider that is prescribing your Xarelto®.   ° °What do you do if you miss a dose? °If you miss a dose, take it as soon as you remember on the same day then continue your regularly scheduled once daily regimen the next day. Do not take two doses of Xarelto® on the same day.  ° °Important Safety Information °A possible side effect of Xarelto® is bleeding. You should call your healthcare provider right away if you experience any of the following: °? Bleeding from an injury or your nose that does not stop. °? Unusual colored urine (red or dark brown) or unusual colored stools (red or black). °? Unusual bruising for unknown reasons. °? A serious  fall or if you hit your head (even if there is no bleeding). ° °Some medicines may interact with Xarelto® and might increase your risk of bleeding while on Xarelto®. To help avoid this, consult your healthcare provider or pharmacist prior to using any new prescription or non-prescription medications, including herbals, vitamins, non-steroidal anti-inflammatory drugs (NSAIDs) and supplements. ° °This website has more information on Xarelto®: www.xarelto.com. ° ° °

## 2016-11-05 NOTE — Progress Notes (Signed)
Inpatient Diabetes Program Recommendations  AACE/ADA: New Consensus Statement on Inpatient Glycemic Control (2015)  Target Ranges:  Prepandial:   less than 140 mg/dL      Peak postprandial:   less than 180 mg/dL (1-2 hours)      Critically ill patients:  140 - 180 mg/dL   Review of Glycemic Control  Diabetes history: DM 2 Outpatient Diabetes medications: Lantus 15 units, Metformin 1000 mg BID Current orders for Inpatient glycemic control: Lantus 15 units QHS, Novolog Moderate tid + Novolog HS scale  Inpatient Diabetes Program Recommendations:    Patient did not get home dose of Lantus last night. Please adjust timing for patient to receive Lantus this am due to hyperglycemia.   Thanks,  Christena Deem RN, MSN, [facility] Inpatient Diabetes Coordinator Team Pager [phone] (8a-5p)

## 2016-11-05 NOTE — Discharge Summary (Signed)
Physician Discharge Summary   Patient ID: Jerry [name] MRN: [id number] DOB/AGE: [birth year] 61 y.o.  Admit date: 11/04/2016 Discharge date: 11/06/2016   Primary Diagnosis:  Osteoarthritis  Right knee(s) Admission Diagnoses:  Past Medical History:  Diagnosis Date  . Arthritis   . Diabetes mellitus 2008   type II  . Dyslipidemia   . Erectile dysfunction   . History of cardiac catheterization 06/2013   Dr. Peter [place]  . HTN (hypertension)   . Hypogonadism male    Discharge Diagnoses:   Active Problems:   OA (osteoarthritis) of knee  Estimated body mass index is 34.08 kg/m as calculated from the following:   Height as of this encounter: 6' 4" (1.93 m).   Weight as of this encounter: 127 kg (280 lb).  Procedure:  Procedure(s) (LRB): RIGHT TOTAL KNEE ARTHROPLASTY (Right)   Consults: None  HPI: Jerry [name] is a 61 y.o. year old male with end stage OA of his right knee with progressively worsening pain and dysfunction. He has constant pain, with activity and at rest and significant functional deficits with difficulties even with ADLs. He has had extensive non-op management including analgesics, injections of cortisone and viscosupplements, and home exercise program, but remains in significant pain with significant dysfunction. Radiographs show bone on bone arthritis medial. He presents now for right Total Knee Arthroplasty.   Laboratory Data: Admission on 11/04/2016  Component Date Value Ref Range Status  . Glucose-Capillary 11/04/2016 259* 65 - 99 mg/dL Final  . Comment 1 11/04/2016 Notify RN   Final  . Glucose-Capillary 11/04/2016 203* 65 - 99 mg/dL Final  . Comment 1 11/04/2016 Notify RN   Final  . Comment 2 11/04/2016 Document in Chart   Final  . Glucose-Capillary 11/04/2016 253* 65 - 99 mg/dL Final  . WBC 11/05/2016 11.5* 4.0 - 10.5 K/uL Final  . RBC 11/05/2016 3.43* 4.22 - 5.81 MIL/uL Final  . Hemoglobin 11/05/2016 11.5* 13.0 - 17.0 g/dL Final  . HCT 11/05/2016  32.1* 39.0 - 52.0 % Final  . MCV 11/05/2016 93.6  78.0 - 100.0 fL Final  . MCH 11/05/2016 33.5  26.0 - 34.0 pg Final  . MCHC 11/05/2016 35.8  30.0 - 36.0 g/dL Final  . RDW 11/05/2016 12.0  11.5 - 15.5 % Final  . Platelets 11/05/2016 233  150 - 400 K/uL Final  . Sodium 11/05/2016 132* 135 - 145 mmol/L Final  . Potassium 11/05/2016 4.4  3.5 - 5.1 mmol/L Final  . Chloride 11/05/2016 99* 101 - 111 mmol/L Final  . CO2 11/05/2016 26  22 - 32 mmol/L Final  . Glucose, Bld 11/05/2016 268* 65 - 99 mg/dL Final  . BUN 11/05/2016 12  6 - 20 mg/dL Final  . Creatinine, Ser 11/05/2016 0.80  0.61 - 1.24 mg/dL Final  . Calcium 11/05/2016 8.9  8.9 - 10.3 mg/dL Final  . GFR calc non Af Amer 11/05/2016 >60  >60 mL/min Final  . GFR calc Af Amer 11/05/2016 >60  >60 mL/min Final   Comment: (NOTE) The eGFR has been calculated using the CKD EPI equation. This calculation has not been validated in all clinical situations. eGFR's persistently <60 mL/min signify possible Chronic Kidney Disease.   . Anion gap 11/05/2016 7  5 - 15 Final  . Glucose-Capillary 11/04/2016 314* 65 - 99 mg/dL Final  . Glucose-Capillary 11/05/2016 255* 65 - 99 mg/dL Final  . Glucose-Capillary 11/05/2016 298* 65 - 99 mg/dL Final  . WBC 11/06/2016 12.8* 4.0 - 10.5 K/uL Final  .  RBC 11/06/2016 2.76* 4.22 - 5.81 MIL/uL Final  . Hemoglobin 11/06/2016 9.3* 13.0 - 17.0 g/dL Final  . HCT 11/06/2016 25.9* 39.0 - 52.0 % Final  . MCV 11/06/2016 93.8  78.0 - 100.0 fL Final  . MCH 11/06/2016 33.7  26.0 - 34.0 pg Final  . MCHC 11/06/2016 35.9  30.0 - 36.0 g/dL Final  . RDW 11/06/2016 12.2  11.5 - 15.5 % Final  . Platelets 11/06/2016 217  150 - 400 K/uL Final  . Sodium 11/06/2016 131* 135 - 145 mmol/L Final  . Potassium 11/06/2016 4.3  3.5 - 5.1 mmol/L Final  . Chloride 11/06/2016 96* 101 - 111 mmol/L Final  . CO2 11/06/2016 27  22 - 32 mmol/L Final  . Glucose, Bld 11/06/2016 233* 65 - 99 mg/dL Final  . BUN 11/06/2016 17  6 - 20 mg/dL Final  .  Creatinine, Ser 11/06/2016 0.80  0.61 - 1.24 mg/dL Final  . Calcium 11/06/2016 9.0  8.9 - 10.3 mg/dL Final  . GFR calc non Af Amer 11/06/2016 >60  >60 mL/min Final  . GFR calc Af Amer 11/06/2016 >60  >60 mL/min Final   Comment: (NOTE) The eGFR has been calculated using the CKD EPI equation. This calculation has not been validated in all clinical situations. eGFR's persistently <60 mL/min signify possible Chronic Kidney Disease.   . Anion gap 11/06/2016 8  5 - 15 Final  . Glucose-Capillary 11/05/2016 303* 65 - 99 mg/dL Final  . Glucose-Capillary 11/05/2016 256* 65 - 99 mg/dL Final  . Glucose-Capillary 11/05/2016 302* 65 - 99 mg/dL Final  Hospital Outpatient Visit on 10/29/2016  Component Date Value Ref Range Status  . aPTT 10/29/2016 36  24 - 36 seconds Final  . WBC 10/29/2016 6.9  4.0 - 10.5 K/uL Final  . RBC 10/29/2016 4.29  4.22 - 5.81 MIL/uL Final  . Hemoglobin 10/29/2016 15.0  13.0 - 17.0 g/dL Final  . HCT 10/29/2016 40.8  39.0 - 52.0 % Final  . MCV 10/29/2016 95.1  78.0 - 100.0 fL Final  . MCH 10/29/2016 35.0* 26.0 - 34.0 pg Final  . MCHC 10/29/2016 36.8* 30.0 - 36.0 g/dL Final  . RDW 10/29/2016 12.2  11.5 - 15.5 % Final  . Platelets 10/29/2016 209  150 - 400 K/uL Final  . Sodium 10/29/2016 132* 135 - 145 mmol/L Final  . Potassium 10/29/2016 4.3  3.5 - 5.1 mmol/L Final  . Chloride 10/29/2016 102  101 - 111 mmol/L Final  . CO2 10/29/2016 22  22 - 32 mmol/L Final  . Glucose, Bld 10/29/2016 308* 65 - 99 mg/dL Final  . BUN 10/29/2016 11  6 - 20 mg/dL Final  . Creatinine, Ser 10/29/2016 0.89  0.61 - 1.24 mg/dL Final  . Calcium 10/29/2016 9.3  8.9 - 10.3 mg/dL Final  . Total Protein 10/29/2016 7.8  6.5 - 8.1 g/dL Final  . Albumin 10/29/2016 4.5  3.5 - 5.0 g/dL Final  . AST 10/29/2016 20  15 - 41 U/L Final  . ALT 10/29/2016 20  17 - 63 U/L Final  . Alkaline Phosphatase 10/29/2016 61  38 - 126 U/L Final  . Total Bilirubin 10/29/2016 0.9  0.3 - 1.2 mg/dL Final  . GFR calc non Af  Amer 10/29/2016 >60  >60 mL/min Final  . GFR calc Af Amer 10/29/2016 >60  >60 mL/min Final   Comment: (NOTE) The eGFR has been calculated using the CKD EPI equation. This calculation has not been validated in all clinical situations. eGFR's  persistently <60 mL/min signify possible Chronic Kidney Disease.   . Anion gap 10/29/2016 8  5 - 15 Final  . Prothrombin Time 10/29/2016 13.0  11.4 - 15.2 seconds Final  . INR 10/29/2016 0.98   Final  . ABO/RH(D) 10/29/2016 A POS   Final  . Antibody Screen 10/29/2016 NEG   Final  . Sample Expiration 10/29/2016 11/07/2016   Final  . Extend sample reason 10/29/2016 NO TRANSFUSIONS OR PREGNANCY IN THE PAST 3 MONTHS   Final  . Glucose-Capillary 10/29/2016 296* 65 - 99 mg/dL Final  . MRSA, PCR 10/29/2016 NEGATIVE  NEGATIVE Final  . Staphylococcus aureus 10/29/2016 NEGATIVE  NEGATIVE Final   Comment:        The Xpert SA Assay (FDA approved for NASAL specimens in patients over 21 years of age), is one component of a comprehensive surveillance program.  Test performance has been validated by [facility] for patients greater than or equal to 83 year old. It is not intended to diagnose infection nor to guide or monitor treatment.      X-Rays:No results found.  EKG: Orders placed or performed during the hospital encounter of 10/29/16  . EKG 12-Lead  . EKG 12-Lead     Hospital Course: Jerry [name] is a 61 y.o. who was admitted to [facility]. They were brought to the operating room on 11/04/2016 and underwent Procedure(s): RIGHT TOTAL KNEE ARTHROPLASTY.  Patient tolerated the procedure well and was later transferred to the recovery room and then to the orthopaedic floor for postoperative care.  They were given PO and IV analgesics for pain control following their surgery.  They were given 24 hours of postoperative antibiotics of  Anti-infectives    Start     Dose/Rate Route Frequency Ordered Stop   11/04/16 1830  ceFAZolin (ANCEF) IVPB  2g/100 mL premix     2 g 200 mL/hr over 30 Minutes Intravenous Every 6 hours 11/04/16 1605 11/05/16 0127   11/03/16 1045  ceFAZolin (ANCEF) 3 g in dextrose 5 % 50 mL IVPB  Status:  Discontinued     3 g 130 mL/hr over 30 Minutes Intravenous On call to O.R. 11/03/16 1042 11/04/16 1605     and started on DVT prophylaxis in the form of Xarelto.   PT and OT were ordered for total joint protocol.  Discharge planning consulted to help with postop disposition and equipment needs.  Patient had a tough night on the evening of surgery due to pain.  They started to get up OOB with therapy on day one. Hemovac drain was pulled without difficulty.  Continued to work with therapy into day two an improved with his mobility.  Dressing was changed on day two and the incision was healing well. Patient was seen in rounds and was ready to go home.   Diet - Cardiac diet and Diabetic diet Follow up - in 2 weeks Activity - WBAT Disposition - Home Condition Upon Discharge - Stable D/C Meds - See DC Summary DVT Prophylaxis - Xarelto  Discharge Instructions    Call MD / Call 911    Complete by:  As directed    If you experience chest pain or shortness of breath, CALL 911 and be transported to the hospital emergency room.  If you develope a fever above 101 F, pus (white drainage) or increased drainage or redness at the wound, or calf pain, call your surgeon's office.   Change dressing    Complete by:  As directed  Change dressing daily with sterile 4 x 4 inch gauze dressing and apply TED hose. Do not submerge the incision under water.   Constipation Prevention    Complete by:  As directed    Drink plenty of fluids.  Prune juice may be helpful.  You may use a stool softener, such as Colace (over the counter) 100 mg twice a day.  Use MiraLax (over the counter) for constipation as needed.   Diet - low sodium heart healthy    Complete by:  As directed    Diet Carb Modified    Complete by:  As directed    Discharge  instructions    Complete by:  As directed    Pick up stool softner and laxative for home use following surgery while on pain medications. Do not submerge incision under water. Please use good hand washing techniques while changing dressing each day. May shower starting three days after surgery. Please use a clean towel to pat the incision dry following showers. Continue to use ice for pain and swelling after surgery. Do not use any lotions or creams on the incision until instructed by your surgeon.  Wear both TED hose on both legs during the day every day for three weeks, but may have off at night at home.  Postoperative Constipation Protocol  Constipation - defined medically as fewer than three stools per week and severe constipation as less than one stool per week.  One of the most common issues patients have following surgery is constipation.  Even if you have a regular bowel pattern at home, your normal regimen is likely to be disrupted due to multiple reasons following surgery.  Combination of anesthesia, postoperative narcotics, change in appetite and fluid intake all can affect your bowels.  In order to avoid complications following surgery, here are some recommendations in order to help you during your recovery period.  Colace (docusate) - Pick up an over-the-counter form of Colace or another stool softener and take twice a day as long as you are requiring postoperative pain medications.  Take with a full glass of water daily.  If you experience loose stools or diarrhea, hold the colace until you stool forms back up.  If your symptoms do not get better within 1 week or if they get worse, check with your doctor.  Dulcolax (bisacodyl) - Pick up over-the-counter and take as directed by the product packaging as needed to assist with the movement of your bowels.  Take with a full glass of water.  Use this product as needed if not relieved by Colace only.   MiraLax (polyethylene glycol) -  Pick up over-the-counter to have on hand.  MiraLax is a solution that will increase the amount of water in your bowels to assist with bowel movements.  Take as directed and can mix with a glass of water, juice, soda, coffee, or tea.  Take if you go more than two days without a movement. Do not use MiraLax more than once per day. Call your doctor if you are still constipated or irregular after using this medication for 7 days in a row.  If you continue to have problems with postoperative constipation, please contact the office for further assistance and recommendations.  If you experience "the worst abdominal pain ever" or develop nausea or vomiting, please contact the office immediatly for further recommendations for treatment.   Take Xarelto for two and a half more weeks, then discontinue Xarelto. Once the patient has completed the blood  thinner regimen, then take a Baby 81 mg Aspirin daily for three more weeks.   Do not put a pillow under the knee. Place it under the heel.    Complete by:  As directed    Do not sit on low chairs, stoools or toilet seats, as it may be difficult to get up from low surfaces    Complete by:  As directed    Driving restrictions    Complete by:  As directed    No driving until released by the physician.   Increase activity slowly as tolerated    Complete by:  As directed    Lifting restrictions    Complete by:  As directed    No lifting until released by the physician.   Patient may shower    Complete by:  As directed    You may shower without a dressing once there is no drainage.  Do not wash over the wound.  If drainage remains, do not shower until drainage stops.   TED hose    Complete by:  As directed    Use stockings (TED hose) for 3 weeks on both leg(s).  You may remove them at night for sleeping.   Weight bearing as tolerated    Complete by:  As directed    Laterality:  right   Extremity:  Lower     Allergies as of 11/06/2016      Reactions    Diclofenac Sodium Other (See Comments)   Gel caused a burning sensation    Lisinopril Cough   Losartan Cough   Unknown bad side effects Unknown bad side effects   Voltaren [diclofenac] Other (See Comments)   Gel caused a burning sensation       Medication List    TAKE these medications   acetaminophen 500 MG tablet Commonly known as:  TYLENOL Take 1,000 mg by mouth every 6 (six) hours as needed for mild pain.   amLODipine 5 MG tablet Commonly known as:  NORVASC TAKE ONE TABLET BY MOUTH ONCE DAILY   carvedilol 25 MG tablet Commonly known as:  COREG TAKE ONE TABLET BY MOUTH TWICE DAILY   gabapentin 100 MG capsule Commonly known as:  NEURONTIN Take 1 capsule (100 mg total) by mouth 3 (three) times daily.   HYDROmorphone 2 MG tablet Commonly known as:  DILAUDID Take 1-3 tablets (2-6 mg total) by mouth every 4 (four) hours as needed for severe pain.   Insulin Glargine 100 UNIT/ML Solostar Pen Commonly known as:  LANTUS SOLOSTAR Inject 15 Units into the skin 2 (two) times daily. What changed:  when to take this   metFORMIN 1000 MG tablet Commonly known as:  GLUCOPHAGE Take 1 tablet (1,000 mg total) by mouth daily. What changed:  when to take this   methocarbamol 500 MG tablet Commonly known as:  ROBAXIN Take 1 tablet (500 mg total) by mouth every 6 (six) hours as needed for muscle spasms.   pregabalin 50 MG capsule Commonly known as:  LYRICA Take 1 capsule (50 mg total) by mouth 2 (two) times daily.   rivaroxaban 10 MG Tabs tablet Commonly known as:  XARELTO Take 1 tablet (10 mg total) by mouth daily with breakfast. Take Xarelto for two and a half more weeks following discharge from the hospital, then discontinue Xarelto. Once the patient has completed the blood thinner regimen, then take a Baby 81 mg Aspirin daily for three more weeks.   rosuvastatin 5 MG tablet Commonly known as:  CRESTOR Take 5 mg by mouth every evening.   traMADol 50 MG tablet Commonly known  as:  ULTRAM Take 1-2 tablets (50-100 mg total) by mouth every 6 (six) hours as needed for moderate pain. What changed:  how much to take  when to take this  reasons to take this            Durable Medical Equipment        Start     Ordered   11/05/16 0941  For home use only DME 3 n 1  Once     11/05/16 0940     Follow-up Information    [facility] Follow up.   Specialty:  Home Health Services Why:  physical therapy Contact information: [place] [address] [phone]        Gearlean Alf, MD. Schedule an appointment as soon as possible for a visit on 11/19/2016.   Specialty:  Orthopedic Surgery Contact information: [address] [place] [address] [phone]           Signed: Arlee Muslim, PA-C Orthopaedic Surgery 11/06/2016, 7:52 AM

## 2016-11-05 NOTE — Progress Notes (Signed)
   11/05/16 1600  PT Visit Information  Last PT Received On 11/05/16  Assistance Needed +1  History of Present Illness Pt is a 61 year old male s/p L TKA with hx of R TKA  Subjective Data  Subjective Pt ambulated in hallway again and performed LE exercises.  Precautions  Precautions Fall;Knee  Required Braces or Orthoses Knee Immobilizer - Right  Pain Assessment  Pain Assessment 0-10  Pain Score 6  Pain Location R knee/thigh  Pain Descriptors / Indicators Sore;Aching  Pain Intervention(s) Monitored during session;Limited activity within patient's tolerance;Premedicated before session;Repositioned;Ice applied  Cognition  Arousal/Alertness Awake/alert  Behavior During Therapy WFL for tasks assessed/performed  Overall Cognitive Status Within Functional Limits for tasks assessed  Bed Mobility  Overal bed mobility Needs Assistance  Bed Mobility Supine to Sit;Sit to Supine  Supine to sit Supervision  Sit to supine Min assist  General bed mobility comments pt able to self assist R LE of bed, assist for support for R LE onto bed  Transfers  Overall transfer level Needs assistance  Equipment used Rolling walker (2 wheeled)  Transfers Sit to/from Stand  Sit to Stand Min guard  General transfer comment verbal cues for safe technique  Ambulation/Gait  Ambulation/Gait assistance Min guard  Ambulation Distance (Feet) 200 Feet  Assistive device Rolling walker (2 wheeled)  Gait Pattern/deviations Step-through pattern;Antalgic  General Gait Details verbal cues for sequence and short steps for pain control  Exercises  Exercises Total Joint  Total Joint Exercises  Ankle Circles/Pumps AROM;Both;10 reps  Quad Sets AROM;Right;10 reps  Short Arc [employer];Right;10 reps  Heel Slides AROM;Both;10 reps  Hip ABduction/ADduction AAROM;Right;10 reps  Straight Leg Raises AAROM;Right;10 reps  PT - End of Session  Equipment Utilized During Treatment Right knee immobilizer  Activity Tolerance  Patient tolerated treatment well  Patient left in bed;with call bell/phone within reach;with family/visitor present  PT - Assessment/Plan  PT Plan Current plan remains appropriate  PT Visit Diagnosis Other abnormalities of gait and mobility (R26.89);Pain  Pain - Right/Left Right  Pain - part of body Knee  PT Frequency (ACUTE ONLY) 7X/week  Follow Up Recommendations Home health PT  PT equipment None recommended by PT  AM-PAC PT "6 Clicks" Daily Activity Outcome Measure  Difficulty turning over in bed (including adjusting bedclothes, sheets and blankets)? 4  Difficulty moving from lying on back to sitting on the side of the bed?  3  Difficulty sitting down on and standing up from a chair with arms (e.g., wheelchair, bedside commode, etc,.)? 3  Help needed moving to and from a bed to chair (including a wheelchair)? 3  Help needed walking in hospital room? 3  Help needed climbing 3-5 steps with a railing?  3  6 Click Score 19  Mobility G Code  CJ  PT Goal Progression  Progress towards PT goals Progressing toward goals  PT Time Calculation  PT Start Time (ACUTE ONLY) 1512  PT Stop Time (ACUTE ONLY) 1537  PT Time Calculation (min) (ACUTE ONLY) 25 min  PT General Charges  $$ ACUTE PT VISIT 1 Procedure  PT Treatments  $Gait Training 8-22 mins  $Therapeutic Exercise 8-22 mins   Zenovia Jarred, PT, DPT 11/05/2016 Pager: [phone]

## 2016-11-05 NOTE — Progress Notes (Signed)
   Subjective: 1 Day Post-Op Procedure(s) (LRB): RIGHT TOTAL KNEE ARTHROPLASTY (Right) Patient reports pain as moderate and severe.   Patient seen in rounds for Dr. Lequita Halt on morning rounds. Patient is having problems with pain in the knee, requiring pain medications Started therapy today. Walked 60 feet first time and then 200 feet the second session. Plan is to go Home after hospital stay.  Objective: Vital signs in last 24 hours: Temp:  [97.8 F (36.6 C)-98.5 F (36.9 C)] 97.9 F (36.6 C) (05/01 1327) Pulse Rate:  [83-103] 83 (05/01 1738) Resp:  [15-16] 16 (05/01 1327) BP: (126-167)/(85-105) 158/85 (05/01 1738) SpO2:  [96 %-100 %] 100 % (05/01 1738)  Intake/Output from previous day:  Intake/Output Summary (Last 24 hours) at 11/05/16 1902 Last data filed at 11/05/16 1410  Gross per 24 hour  Intake             2465 ml  Output             2265 ml  Net              200 ml    Intake/Output this shift: No intake/output data recorded.  Labs:  Recent Labs  11/05/16 0515  HGB 11.5*    Recent Labs  11/05/16 0515  WBC 11.5*  RBC 3.43*  HCT 32.1*  PLT 233    Recent Labs  11/05/16 0515  NA 132*  K 4.4  CL 99*  CO2 26  BUN 12  CREATININE 0.80  GLUCOSE 268*  CALCIUM 8.9   No results for input(s): LABPT, INR in the last 72 hours.  EXAM General - Patient is Alert, Appropriate and Oriented Extremity - Neurovascular intact Sensation intact distally Intact pulses distally Dorsiflexion/Plantar flexion intact Dressing - dressing C/D/I Motor Function - intact, moving foot and toes well on exam.  Hemovac pulled without difficulty.  Past Medical History:  Diagnosis Date  . Arthritis   . Diabetes mellitus 2008   type II  . Dyslipidemia   . Erectile dysfunction   . History of cardiac catheterization 06/2013   Dr. Peter [place]  . HTN (hypertension)   . Hypogonadism male     Assessment/Plan: 1 Day Post-Op Procedure(s) (LRB): RIGHT TOTAL KNEE  ARTHROPLASTY (Right) Active Problems:   OA (osteoarthritis) of knee  Estimated body mass index is 34.08 kg/m as calculated from the following:   Height as of this encounter: 6\' 4"  (1.93 m).   Weight as of this encounter: 127 kg (280 lb). Advance diet Up with therapy Plan for discharge tomorrow Discharge home with home health  DVT Prophylaxis - Xarelto Weight-Bearing as tolerated to right leg D/C O2 and Pulse OX and try on Room Air  , PA-C Orthopaedic Surgery 11/05/2016, 7:02 PM

## 2016-11-06 LAB — CBC
HEMATOCRIT: 25.9 % — AB (ref 39.0–52.0)
HEMOGLOBIN: 9.3 g/dL — AB (ref 13.0–17.0)
MCH: 33.7 pg (ref 26.0–34.0)
MCHC: 35.9 g/dL (ref 30.0–36.0)
MCV: 93.8 fL (ref 78.0–100.0)
Platelets: 217 10*3/uL (ref 150–400)
RBC: 2.76 MIL/uL — AB (ref 4.22–5.81)
RDW: 12.2 % (ref 11.5–15.5)
WBC: 12.8 10*3/uL — AB (ref 4.0–10.5)

## 2016-11-06 LAB — BASIC METABOLIC PANEL
ANION GAP: 8 (ref 5–15)
BUN: 17 mg/dL (ref 6–20)
CHLORIDE: 96 mmol/L — AB (ref 101–111)
CO2: 27 mmol/L (ref 22–32)
Calcium: 9 mg/dL (ref 8.9–10.3)
Creatinine, Ser: 0.8 mg/dL (ref 0.61–1.24)
GFR calc non Af Amer: >60 mL/min (ref >60)
GLUCOSE: 233 mg/dL — AB (ref 65–99)
POTASSIUM: 4.3 mmol/L (ref 3.5–5.1)
Sodium: 131 mmol/L — ABNORMAL LOW (ref 135–145)

## 2016-11-06 LAB — GLUCOSE, CAPILLARY
Glucose-Capillary: 230 mg/dL — ABNORMAL HIGH (ref 65–99)
Glucose-Capillary: 248 mg/dL — ABNORMAL HIGH (ref 65–99)

## 2016-11-06 MED ORDER — METHOCARBAMOL 500 MG PO TABS: 500.0000 mg | ORAL_TABLET | Freq: Four times a day (QID) | ORAL | 0 refills | Status: DC | PRN

## 2016-11-06 MED ORDER — RIVAROXABAN 10 MG PO TABS: 10.0000 mg | ORAL_TABLET | Freq: Every day | ORAL | 0 refills | Status: DC

## 2016-11-06 MED ORDER — HYDROMORPHONE HCL 2 MG PO TABS: 2.0000 mg | ORAL_TABLET | ORAL | 0 refills | Status: DC | PRN

## 2016-11-06 MED ORDER — TRAMADOL HCL 50 MG PO TABS: 50.0000 mg | ORAL_TABLET | Freq: Four times a day (QID) | ORAL | 0 refills | Status: DC | PRN

## 2016-11-06 NOTE — Progress Notes (Signed)
   Subjective: 2 Days Post-Op Procedure(s) (LRB): RIGHT TOTAL KNEE ARTHROPLASTY (Right) Patient reports pain as moderate.   Patient seen in rounds by Dr. Lequita Halt. Patient is well, but has had some minor complaints of pain in the knee, requiring pain medications Patient is ready to go home  Objective: Vital signs in last 24 hours: Temp:  [97.9 F (36.6 C)-98.6 F (37 C)] 98.6 F (37 C) (05/02 0725) Pulse Rate:  [79-103] 90 (05/02 0725) Resp:  [15-16] 16 (05/02 0725) BP: (135-166)/(85-103) 135/88 (05/02 0725) SpO2:  [96 %-100 %] 100 % (05/02 0725)  Intake/Output from previous day:  Intake/Output Summary (Last 24 hours) at 11/06/16 0747 Last data filed at 11/06/16 0726  Gross per 24 hour  Intake          1833.33 ml  Output             2125 ml  Net          -291.67 ml    Intake/Output this shift: Total I/O In: 240 [P.O.:240] Out: 350 [Urine:350]  Labs:  Recent Labs  11/05/16 0515 11/06/16 0435  HGB 11.5* 9.3*    Recent Labs  11/05/16 0515 11/06/16 0435  WBC 11.5* 12.8*  RBC 3.43* 2.76*  HCT 32.1* 25.9*  PLT 233 217    Recent Labs  11/05/16 0515 11/06/16 0435  NA 132* 131*  K 4.4 4.3  CL 99* 96*  CO2 26 27  BUN 12 17  CREATININE 0.80 0.80  GLUCOSE 268* 233*  CALCIUM 8.9 9.0   No results for input(s): LABPT, INR in the last 72 hours.  EXAM: General - Patient is pain in the knee, requiring pain medications Extremity - Neurovascular intact Sensation intact distally Intact pulses distally Incision - clean, dry Motor Function - intact, moving foot and toes well on exam.   Assessment/Plan: 2 Days Post-Op Procedure(s) (LRB): RIGHT TOTAL KNEE ARTHROPLASTY (Right) Procedure(s) (LRB): RIGHT TOTAL KNEE ARTHROPLASTY (Right) Past Medical History:  Diagnosis Date  . Arthritis   . Diabetes mellitus 2008   type II  . Dyslipidemia   . Erectile dysfunction   . History of cardiac catheterization 06/2013   Dr. Peter [place]  . HTN (hypertension)   .  Hypogonadism male    Active Problems:   OA (osteoarthritis) of knee  Estimated body mass index is 34.08 kg/m as calculated from the following:   Height as of this encounter: 6\' 4"  (1.93 m).   Weight as of this encounter: 127 kg (280 lb). Up with therapy Diet - Cardiac diet and Diabetic diet Follow up - in 2 weeks Activity - WBAT Disposition - Home Condition Upon Discharge - Stable D/C Meds - See DC Summary DVT Prophylaxis - Xarelto  , PA-C Orthopaedic Surgery 11/06/2016, 7:47 AM

## 2016-11-06 NOTE — Progress Notes (Signed)
Physical Therapy Treatment Patient Details Name: Jerry [name] MRN: [id number] DOB: [birth year] Today's Date: 11/06/2016    History of Present Illness Pt is a 61 year old male s/p L TKA with hx of R TKA    PT Comments    Pt ambulated in hallway and performed LE exercises in supine.  Pt to practice stairs in afternoon session prior to d/c.  Follow Up Recommendations  Home health PT     Equipment Recommendations  None recommended by PT    Recommendations for Other Services       Precautions / Restrictions Precautions Precautions: Fall;Knee Required Braces or Orthoses: Knee Immobilizer - Right Restrictions Other Position/Activity Restrictions: WBAT    Mobility  Bed Mobility Overal bed mobility: Needs Assistance Bed Mobility: Supine to Sit;Sit to Supine     Supine to sit: Supervision Sit to supine: Supervision   General bed mobility comments: pt able to self assist R LE   Transfers Overall transfer level: Needs assistance Equipment used: Rolling walker (2 wheeled) Transfers: Sit to/from Stand Sit to Stand: Min guard         General transfer comment: verbal cues for safe technique  Ambulation/Gait Ambulation/Gait assistance: Min guard Ambulation Distance (Feet): 120 Feet Assistive device: Rolling walker (2 wheeled) Gait Pattern/deviations: Step-through pattern;Antalgic     General Gait Details: verbal cues for sequence and short steps for pain control, pt reports increase in pain with distance   Stairs            Wheelchair Mobility    Modified Rankin (Stroke Patients Only)       Balance                                            Cognition Arousal/Alertness: Awake/alert Behavior During Therapy: WFL for tasks assessed/performed Overall Cognitive Status: Within Functional Limits for tasks assessed                                        Exercises Total Joint Exercises Ankle Circles/Pumps: AROM;Both;10  reps Quad Sets: AROM;Right;10 reps Short Arc QuadBarbaraann Boys;Right;10 reps Heel Slides: 10 reps;AAROM;Right Hip ABduction/ADduction: AAROM;Right;10 reps Straight Leg Raises: AAROM;Right;10 reps    General Comments        Pertinent Vitals/Pain Pain Assessment: 0-10 Pain Score: 5  Pain Location: R knee/thigh Pain Descriptors / Indicators: Sore;Aching Pain Intervention(s): Limited activity within patient's tolerance;Monitored during session;Repositioned;Premedicated before session    Home Living                      Prior Function            PT Goals (current goals can now be found in the care plan section) Progress towards PT goals: Progressing toward goals    Frequency    7X/week      PT Plan Current plan remains appropriate    Co-evaluation              AM-PAC PT "6 Clicks" Daily Activity  Outcome Measure  Difficulty turning over in bed (including adjusting bedclothes, sheets and blankets)?: None Difficulty moving from lying on back to sitting on the side of the bed? : A Little Difficulty sitting down on and standing up from a chair with arms (e.g., wheelchair, bedside commode,  etc,.)?: A Little Help needed moving to and from a bed to chair (including a wheelchair)?: A Little Help needed walking in hospital room?: A Little Help needed climbing 3-5 steps with a railing? : A Little 6 Click Score: 19    End of Session Equipment Utilized During Treatment: Right knee immobilizer Activity Tolerance: Patient tolerated treatment well Patient left: in bed;with call bell/phone within reach;with family/visitor present   PT Visit Diagnosis: Other abnormalities of gait and mobility (R26.89);Pain Pain - Right/Left: Right Pain - part of body: Knee     Time: 4680-3212 PT Time Calculation (min) (ACUTE ONLY): 30 min  Charges:  $Gait Training: 8-22 mins $Therapeutic Exercise: 8-22 mins                    G Codes:       Jerry [name], PT,  DPT 11/06/2016 Pager: [phone]    Maida Sale E 11/06/2016, 12:31 PM

## 2016-11-06 NOTE — Progress Notes (Signed)
   11/06/16 1300  PT Visit Information  Last PT Received On 11/06/16  Assistance Needed +1  History of Present Illness Pt is a 61 year old male s/p L TKA with hx of R TKA  Subjective Data  Subjective Pt ambulated in hallway and practiced safe stair technique.  Pt feels ready for d/c home today and had no further questions.  Precautions  Precautions Fall;Knee  Required Braces or Orthoses Knee Immobilizer - Right  Restrictions  Other Position/Activity Restrictions WBAT  Pain Assessment  Pain Assessment 0-10  Pain Score 6  Pain Location R knee/thigh  Pain Descriptors / Indicators Sore;Aching  Pain Intervention(s) Limited activity within patient's tolerance;Monitored during session;Repositioned  Cognition  Arousal/Alertness Awake/alert  Behavior During Therapy WFL for tasks assessed/performed  Overall Cognitive Status Within Functional Limits for tasks assessed  Bed Mobility  Overal bed mobility Needs Assistance  Bed Mobility Supine to Sit  Supine to sit Supervision  General bed mobility comments pt able to self assist R LE   Transfers  Overall transfer level Needs assistance  Equipment used Rolling walker (2 wheeled)  Transfers Sit to/from Stand  Sit to Stand Supervision  General transfer comment verbal cues for safe technique  Ambulation/Gait  Ambulation/Gait assistance Min guard;Supervision  Ambulation Distance (Feet) 140 Feet  Assistive device Rolling walker (2 wheeled)  Gait Pattern/deviations Step-through pattern;Antalgic  General Gait Details verbal cues for sequence and short steps for pain control,  Stairs Yes  Stairs assistance Min guard  Stair Management Step to pattern;Forwards;Two rails  Number of Stairs 5  General stair comments verbal cues for safe sequence and technique, performed 2 up and 3 down and then performed again   PT - End of Session  Equipment Utilized During Treatment Right knee immobilizer  Activity Tolerance Patient tolerated treatment well   Patient left in chair;with call bell/phone within reach  PT - Assessment/Plan  PT Plan Current plan remains appropriate  PT Visit Diagnosis Other abnormalities of gait and mobility (R26.89);Pain  Pain - Right/Left Right  Pain - part of body Knee  PT Frequency (ACUTE ONLY) 7X/week  Follow Up Recommendations Home health PT  PT equipment None recommended by PT  AM-PAC PT "6 Clicks" Daily Activity Outcome Measure  Difficulty turning over in bed (including adjusting bedclothes, sheets and blankets)? 4  Difficulty moving from lying on back to sitting on the side of the bed?  3  Difficulty sitting down on and standing up from a chair with arms (e.g., wheelchair, bedside commode, etc,.)? 3  Help needed moving to and from a bed to chair (including a wheelchair)? 3  Help needed walking in hospital room? 3  Help needed climbing 3-5 steps with a railing?  3  6 Click Score 19  Mobility G Code  CJ  PT Goal Progression  Progress towards PT goals Progressing toward goals  PT Time Calculation  PT Start Time (ACUTE ONLY) 1312  PT Stop Time (ACUTE ONLY) 1329  PT Time Calculation (min) (ACUTE ONLY) 17 min  PT General Charges  $$ ACUTE PT VISIT 1 Procedure  PT Treatments  $Gait Training 8-22 mins   Zenovia Jarred, PT, DPT 11/06/2016 Pager: [phone]

## 2018-05-12 ENCOUNTER — Encounter: Payer: Self-pay | Admitting: Podiatry

## 2018-05-12 ENCOUNTER — Other Ambulatory Visit: Payer: Self-pay

## 2018-05-12 ENCOUNTER — Ambulatory Visit: Payer: Medicare PPO | Admitting: Podiatry

## 2018-05-12 VITALS — BP 160/79 | HR 84

## 2018-05-12 DIAGNOSIS — B351 Tinea unguium: Secondary | ICD-10-CM

## 2018-05-12 DIAGNOSIS — E1142 Type 2 diabetes mellitus with diabetic polyneuropathy: Secondary | ICD-10-CM | POA: Diagnosis not present

## 2018-05-12 DIAGNOSIS — M79674 Pain in right toe(s): Secondary | ICD-10-CM

## 2018-05-12 DIAGNOSIS — M79675 Pain in left toe(s): Secondary | ICD-10-CM | POA: Diagnosis not present

## 2018-05-12 NOTE — Patient Instructions (Addendum)
Diabetic Neuropathy Diabetic neuropathy is a nerve disease or nerve damage that is caused by diabetes mellitus. About half of all people with diabetes mellitus have some form of nerve damage. Nerve damage is more common in those who have had diabetes mellitus for many years and who generally have not had good control of their blood sugar (glucose) level. Diabetic neuropathy is a common complication of diabetes mellitus. There are three common types of diabetic neuropathy and a fourth type that is less common and less understood:  Peripheral neuropathy-This is the most common type of diabetic neuropathy. It causes damage to the nerves of the feet and legs first and then eventually the hands and arms. The damage affects the ability to sense touch.  Autonomic neuropathy-This type causes damage to the autonomic nervous system, which controls the following functions: ? Heartbeat. ? Body temperature. ? Blood pressure. ? Urination. ? Digestion. ? Sweating. ? Sexual function.  Focal neuropathy-Focal neuropathy can be painful and unpredictable and occurs most often in older adults with diabetes mellitus. It involves a specific nerve or one area and often comes on suddenly. It usually does not cause long-term problems.  Radiculoplexus neuropathy- Sometimes called lumbosacral radiculoplexus neuropathy, radiculoplexus neuropathy affects the nerves of the thighs, hips, buttocks, or legs. It is more common in people with type 2 diabetes mellitus and in older men. It is characterized by debilitating pain, weakness, and atrophy, usually in the thigh muscles.  What are the causes? The cause of peripheral, autonomic, and focal neuropathies is diabetes mellitus that is uncontrolled and high glucose levels. The cause of radiculoplexus neuropathy is unknown. However, it is thought to be caused by inflammation related to uncontrolled glucose levels. What are the signs or symptoms? Peripheral Neuropathy Peripheral  neuropathy develops slowly over time. When the nerves of the feet and legs no longer work there may be:  Burning, stabbing, or aching pain in the legs or feet.  Inability to feel pressure or pain in your feet. This can lead to: ? Thick calluses over pressure areas. ? Pressure sores. ? Ulcers.  Foot deformities.  Reduced ability to feel temperature changes.  Muscle weakness.  Autonomic Neuropathy The symptoms of autonomic neuropathy vary depending on which nerves are affected. Symptoms may include:  Problems with digestion, such as: ? Feeling sick to your stomach (nausea). ? Vomiting. ? Bloating. ? Constipation. ? Diarrhea. ? Abdominal pain.  Difficulty with urination. This occurs if you lose your ability to sense when your bladder is full. Problems include: ? Urine leakage (incontinence). ? Inability to empty your bladder completely (retention).  Rapid or irregular heartbeat (palpitations).  Blood pressure drops when you stand up (orthostatic hypotension). When you stand up you may feel: ? Dizzy. ? Weak. ? Faint.  In men, inability to attain and maintain an erection.  In women, vaginal dryness and problems with decreased sexual desire and arousal.  Problems with body temperature regulation.  Increased or decreased sweating.  Focal Neuropathy  Abnormal eye movements or abnormal alignment of both eyes.  Weakness in the wrist.  Foot drop. This results in an inability to lift the foot properly and abnormal walking or foot movement.  Paralysis on one side of your face (Bell palsy).  Chest or abdominal pain. Radiculoplexus Neuropathy  Sudden, severe pain in your hip, thigh, or buttocks.  Weakness and wasting of thigh muscles.  Difficulty rising from a seated position.  Abdominal swelling.  Unexplained weight loss (usually more than 10 lb [4.5 kg]). How is   this diagnosed? Peripheral Neuropathy Your senses may be tested. Sensory function testing can be  done with:  A light touch using a monofilament.  A vibration with tuning fork.  A sharp sensation with a pin prick.  Other tests that can help diagnose neuropathy are:  Nerve conduction velocity. This test checks the transmission of an electrical current through a nerve.  Electromyography. This shows how muscles respond to electrical signals transmitted by nearby nerves.  Quantitative sensory testing. This is used to assess how your nerves respond to vibrations and changes in temperature.  Autonomic Neuropathy Diagnosis is often based on reported symptoms. Tell your health care provider if you experience:  Dizziness.  Constipation.  Diarrhea.  Inappropriate urination or inability to urinate.  Inability to get or maintain an erection.  Tests that may be done include:  Electrocardiography or Holter monitor. These are tests that can help show problems with the heart rate or heart rhythm.  An X-ray exam may be done.  Focal Neuropathy Diagnosis is made based on your symptoms and what your health care provider finds during your exam. Other tests may be done. They may include:  Nerve conduction velocities. This checks the transmission of electrical current through a nerve.  Electromyography. This shows how muscles respond to electrical signals transmitted by nearby nerves.  Quantitative sensory testing. This test is used to assess how your nerves respond to vibration and changes in temperature.  Radiculoplexus Neuropathy  Often the first thing is to eliminate any other issue or problems that might be the cause, as there is no standard test for diagnosis.  X-ray exam of your spine and lumbar region.  Spinal tap to rule out cancer.  MRI to rule out other lesions. How is this treated? Once nerve damage occurs, it cannot be reversed. The goal of treatment is to keep the disease or nerve damage from getting worse and affecting more nerve fibers. Controlling your blood  glucose level is the key. Most people with radiculoplexus neuropathy see at least a partial improvement over time. You will need to keep your blood glucose and HbA1c levels in the target range determined by your health care provider. Things that help control blood glucose levels include:  Blood glucose monitoring.  Meal planning.  Physical activity.  Diabetes medicine.  Over time, maintaining lower blood glucose levels helps lessen symptoms. Sometimes, prescription pain medicine is needed. Follow these instructions at home:  Do not smoke.  Keep your blood glucose level in the range that you and your health care provider have determined acceptable for you.  Keep your blood pressure level in the range that you and your health care provider have determined acceptable for you.  Eat a well-balanced diet.  Be physically active every day. Include strength training and balance exercises.  Protect your feet. ? Check your feet every day for sores, cuts, blisters, or signs of infection. ? Wear padded socks and supportive shoes. Use orthotic inserts, if necessary. ? Regularly check the insides of your shoes for worn spots. Make sure there are no rocks or other items inside your shoes before you put them on. Contact a health care provider if:  You have burning, stabbing, or aching pain in the legs or feet.  You are unable to feel pressure or pain in your feet.  You develop problems with digestion such as: ? Nausea. ? Vomiting. ? Bloating. ? Constipation. ? Diarrhea. ? Abdominal pain.  You have difficulty with urination, such as: ? Incontinence. ? Retention.    You have palpitations.  You develop orthostatic hypotension. When you stand up you may feel: ? Dizzy. ? Weak. ? Faint.  You cannot attain and maintain an erection (in men).  You have vaginal dryness and problems with decreased sexual desire and arousal (in women).  You have severe pain in your thighs, legs, or  buttocks.  You have unexplained weight loss. This information is not intended to replace advice given to you by your health care provider. Make sure you discuss any questions you have with your health care provider. Document Released: 09/02/2001 Document Revised: 11/30/2015 Document Reviewed: 12/03/2012 [employer] Interactive Patient Education  2017 [employer]  

## 2018-05-13 ENCOUNTER — Encounter: Payer: Self-pay | Admitting: Podiatry

## 2018-05-13 NOTE — Progress Notes (Signed)
Subjective: Jerry [name] presents to clinic today referred by Patrecia Pace for diabetic foot evaluation. Patient states he has  diabetes and diabetic neuropathy.  He relates his toenails are elongated, tender, thick, and discolored.  Patient relates pain when wearing enclosed shoe gear.  He is afraid to cut his nails because of his diabetic neuropathy.   Past medical history: History of cardiac catheterization, diabetes mellitus, arthritis, dyslipidemia, erectile dysfunction, hypertension, diabetic neuropathy, chronic left-sided low back pain with left-sided sciatica, acute medical acute medial meniscal tear, hyperlipidemia, polyarthralgia, dyspnea, chronic postoperative pain, microcytic anemia, obesity  Surgical history: Total knee arthroplasty right, total knee arthroplasty left, knee arthroscopy left, left heart catheterization with coronary angiogram, colonoscopy, appendectomy, incision and drainage perirectal abscess, right arm and elbow surgery, tonsillectomy, wisdom teeth extractions  Medications   New medications from outside sources are available for reconciliation   acetaminophen (TYLENOL) 500 MG tablet    amLODipine (NORVASC) 5 MG tablet    Blood Glucose Monitoring Suppl (GLUCOCOM BLOOD GLUCOSE MONITOR) DEVI    carvedilol (COREG) 25 MG tablet    gabapentin (NEURONTIN) 100 MG capsule    glucose blood (TRUE METRIX BLOOD GLUCOSE TEST) test strip    HYDROmorphone (DILAUDID) 2 MG tablet    Insulin Glargine (LANTUS SOLOSTAR) 100 UNIT/ML Solostar Pen    metFORMIN (GLUCOPHAGE) 1000 MG tablet    methocarbamol (ROBAXIN) 500 MG tablet    pregabalin (LYRICA) 50 MG capsule    rivaroxaban (XARELTO) 10 MG TABS tablet    rosuvastatin (CRESTOR) 5 MG tablet    traMADol (ULTRAM) 50 MG tablet    Allergies: Diclofenac gel sodium causes burning sensation Lisinopril causes cough Losartan causes cough Voltaren gel causes burning sensation   Tobacco history: Mr. Deis is a former smoker.  He  quit in May 2003.  He has never used smokeless tobacco.  Family history: Mother has diabetes. Father's deceased with cause of death being a motor vehicle accident Brother is deceased with cause of death being a motor vehicle accident Negative family history of stroke, heart disease, cancer, hypertension.  Review  of  Systems  Pertinent positives highlighted in color Pertinent negatives remain in black              General:  Usual weight, recent weight change, weakness, fatigue, fever, night sweats, anorexia, malaise                   Skin:  Color changes, pruritus, bruising, petechiae, infections, rashes, sores, changes in moles, changes in hair, changes in toenails, changes in fingernails, venous stasis, open wound                    Head:  Headache, head injury   Eyes:  Vision, glasses/contact lens, date of last eye examination, pain, redness, excessive tearing, double vision (diplopia),  floaters, loss of any visual fields, history of glaucoma or cataracts                    Ears:  Hearing loss, change in hearing, ringing in ears (tinnitus), ear infections   Nose and Sinuses:  Frequent colds, nasal stuffiness, hay fever, nosebleeds (epistaxis), sinus trouble, obstruction, discharge,   pain, change in ability to smell, sneezing, post-nasal drip, history of nasal polyps   Mouth and throat:  Soreness, dryness, pain, ulcers, sore tongue, bleeding gums, pyorrhea, teeth (caries, abscesses, extractions, dentures), sore throat, hoarseness, history of recurrent sore throats or of  strep throat or of  rheumatic fever  Neck:  Lumps, swollen lymph nodes or glands, goiter (thyroid enlargement), pain                    Lymphatics:  Swollen lymph nodes in neck, axillae, epitrochlear areas, or inguinal area                    Breasts:  Lumps, pain, nipple discharge, self-examination, enlargement in men or children (gynecomastia)                    Pulmonary:  Cough (duration,  association with sputum production), change in chronic cough, trouble breathing (dyspnea), wheezing, coughing up blood (hemoptysis), pain with taking a deep breath (pleuritic chest pain), blue discoloration of lips or nailbeds (cyanosis), history of exposure to TB, history of a previous TB skin test and the results if done, recurrent pneumonia, history of environmental exposure                    Cardiovascular:  Chest pain, dyspnea, paroxysmal nocturnal dyspnea, orthopnea, edema, palpitations, hypertension, known heart disease, history of a murmur, history of rheumatic fever, syncope or near syncope, claudication, varicosities, thrombophlebitis, history of an abnormal electrocardiogram   Gastrointestinal:  Trouble swallowing (dysphagia), pain with swallowing (odynophagia), nausea, vomiting, vomitin   blood   (hematemesis),     food   intolerance,   indigestion, heartburn, change in appetite, sensation of filling up  earlier than usual (early satiety),frequency and character (formed vs. loose) of bowel movements, changes in bowel pattern, rectal bleeding, passing black tarry stools (melena), constipation, diarrhea, abdominal pain, excessive belching or passing of gas, hemorrhoids, jaundice, liver or gallbladder problems,   history  of   hepatitis                          Urinary:  Blood in urine (hematuria), pain on urination (dysuria), frequency, suprapubic pain, costovertebral angle (CVA)  tenderness, frequent urination at night (nocturia), passing large volumes of urine on a frequent basis (polyuria),  stones, inguinal pain, trouble initiating urinary stream, incontinence, history of urinary tract infections                                  Musculoskeletal:  Joint pains or stiffness, arthritis, gout, backache, joint swelling or tenderness or effusion, limitation of  motion, history of fractures  Neurologic:  Fainting, blackouts, seizures, paralysis, local weakness, numbness, tingling, tremors, memory  changes,   headaches,    vertigo  or     dizziness,    muscle   atrophy                Psychiatric:  Anxiety, nightmares, nervousness, irritability, depression, insomnia, hypersomnia, phobias, tension.  If there are any clues whatsoever that the patient may be suicidal, may have criminal or other sociopathic behavior,this should be pursued.                          Endocrine:  Thyroid trouble, heat or cold intolerance, excessive sweating or flushing, diabetes, excessive thirst or hunger   or urination                    Hematologic:  Anemia, easy bruising or bleeding, past transfusions and reactions   Objective: Vitals:   05/12/18 0917  BP: (!) 160/79  Pulse: 84  Vascular Examination: Capillary refill time immediate x 10 digits Dorsalis pedis and posterior tibial pulses present b/l Sparse digital hair x 10 digits Skin temperature warm to warm b/l  Dermatological Examination: Skin with normal turgor, texture and tone b/l Toenails 1-5 b/l discolored, thick, dystrophic with subungual debris and pain with palpation to nailbeds due to thickness of nails.  Musculoskeletal: Muscle strength 5/5 to all LE muscle groups HAV with bunion deformity b/l  Neurological: Sensation intact with 10 gram monofilament. Vibratory sensation intact.  Assessment: Painful onychomycosis toenails 1-5 b/l  NIDDM with diabetic neuropathy managed with gabapentin  Plan: 1. Toenails 1-5 b/l were debrided in length and girth without iatrogenic bleeding. 2. Patient to continue soft, supportive shoe gear 3. Patient to report any pedal injuries to medical professional immediately. 4. Follow up 3 months. Patient/POA to call should there be a concern in the interim.

## 2018-08-11 ENCOUNTER — Ambulatory Visit: Payer: Medicare PPO | Admitting: Podiatry

## 2019-03-08 ENCOUNTER — Other Ambulatory Visit: Payer: Self-pay

## 2019-03-08 DIAGNOSIS — Z20822 Contact with and (suspected) exposure to covid-19: Secondary | ICD-10-CM

## 2019-03-10 LAB — NOVEL CORONAVIRUS, NAA: SARS-CoV-2, NAA: DETECTED — AB

## 2019-06-24 DIAGNOSIS — Z8616 Personal history of COVID-19: Secondary | ICD-10-CM | POA: Insufficient documentation

## 2021-02-04 ENCOUNTER — Other Ambulatory Visit: Payer: Self-pay

## 2021-02-04 ENCOUNTER — Ambulatory Visit (HOSPITAL_COMMUNITY)
Admission: RE | Admit: 2021-02-04 | Discharge: 2021-02-04 | Disposition: A | Discharge status: Home / Self Care | Payer: Medicare PPO | Source: Ambulatory Visit | Attending: Internal Medicine | Admitting: Internal Medicine

## 2021-02-04 ENCOUNTER — Encounter (HOSPITAL_COMMUNITY): Payer: Self-pay

## 2021-02-04 VITALS — BP 142/91 | HR 99 | Temp 98.9°F | Resp 18

## 2021-02-04 DIAGNOSIS — K0889 Other specified disorders of teeth and supporting structures: Secondary | ICD-10-CM | POA: Diagnosis not present

## 2021-02-04 DIAGNOSIS — K047 Periapical abscess without sinus: Secondary | ICD-10-CM

## 2021-02-04 MED ORDER — AMOXICILLIN 875 MG PO TABS: 875.0000 mg | ORAL_TABLET | Freq: Two times a day (BID) | ORAL | 0 refills | Status: AC

## 2021-02-04 MED ORDER — KETOROLAC TROMETHAMINE 60 MG/2ML IM SOLN
30.0000 mg | Freq: Once | INTRAMUSCULAR | Status: AC
  Administered 2021-02-04: 30 mg via INTRAMUSCULAR

## 2021-02-04 MED ORDER — KETOROLAC TROMETHAMINE 30 MG/ML IJ SOLN
INTRAMUSCULAR | Status: AC
  Filled 2021-02-04: qty 1

## 2021-02-04 NOTE — ED Provider Notes (Signed)
[facility]    CSN: [id number] Arrival date & time: 02/04/21  1537      History   Chief Complaint Chief Complaint  Patient presents with   Dental Pain    HPI Jerry [name] is a 65 y.o. male.   Patient presents with 2-day history of right-sided dental pain that he thinks is related to a dental abscess.  Pain occurs with any mouth movement including chewing and swallowing.  Denies any difficulty swallowing or difficulty breathing.  Denies any known fevers at home.  Patient has taken Tylenol with minimal relief of pain.   Dental Pain  Past Medical History:  Diagnosis Date   Arthritis    Diabetes mellitus 2008   type II   Dyslipidemia    Erectile dysfunction    History of cardiac catheterization 06/2013   Dr. Peter [place]   HTN (hypertension)    Hypogonadism male     Patient Active Problem List   Diagnosis Date Noted   Chronic left-sided low back pain with left-sided sciatica 03/21/2016   Chronic postoperative pain 03/21/2016   Status post revision of total knee replacement 03/21/2016   Primary osteoarthritis of right knee 03/21/2016   Macrocytic anemia 12/03/2015   Obesity 12/03/2015   OA (osteoarthritis) of knee 10/04/2015   Pre-operative cardiovascular examination 08/03/2015   Normal coronary arteries 08/03/2015   Acute medial meniscal tear 12/27/2014   Essential hypertension, benign 01/14/2014   Dyslipidemia 01/14/2014   Other diabetic neurological complication associated with type 2 diabetes mellitus (HCC) 01/14/2014   Type II or unspecified type diabetes mellitus without mention of complication, uncontrolled 09/20/2013   Erectile dysfunction 09/20/2013   Osteoarthritis, knee 09/20/2013   Dyspnea 04/14/2013   HTN (hypertension), malignant 04/14/2013   Type 2 diabetes mellitus without complication (HCC) 11/12/2011   Hyperlipidemia 11/12/2011   ED (erectile dysfunction) 11/12/2011   Polyarthralgia 11/12/2011    Past Surgical History:   Procedure Laterality Date   APPENDECTOMY     COLONOSCOPY  08/2006   Dr. Russella Dar - repeat 2018   INCISION AND DRAINAGE PERIRECTAL ABSCESS     KNEE ARTHROSCOPY Left 12/28/2014   Procedure: ARTHROSCOPY LEFT KNEE WITH  MEDIAL MENSICUS DEBRIDEMENT;  Surgeon: Ollen Gross, MD;  Location: [facility];  Service: Orthopedics;  Laterality: Left;   KNEE SURGERY     left knee - remote past   LEFT HEART CATHETERIZATION WITH CORONARY ANGIOGRAM N/A 06/11/2013   Procedure: LEFT HEART CATHETERIZATION WITH CORONARY ANGIOGRAM;  Surgeon: Peter M Swaziland, MD;  Location: [facility] CATH LAB;  Service: Cardiovascular;  Laterality: N/A;   right arm and elbow surgery     TONSILLECTOMY     TOTAL KNEE ARTHROPLASTY Left 10/04/2015   Procedure: LEFT TOTAL KNEE ARTHROPLASTY right knee aspiration and injection;  Surgeon: Ollen Gross, MD;  Location: [facility];  Service: Orthopedics;  Laterality: Left;   TOTAL KNEE ARTHROPLASTY Right 11/04/2016   Procedure: RIGHT TOTAL KNEE ARTHROPLASTY;  Surgeon: Ollen Gross, MD;  Location: [facility];  Service: Orthopedics;  Laterality: Right;  with abductor block   wisdom teeth extractions         Home Medications    Prior to Admission medications   Medication Sig Start Date End Date Taking? Authorizing Provider  amoxicillin (AMOXIL) 875 MG tablet Take 1 tablet (875 mg total) by mouth 2 (two) times daily for 10 days. 02/04/21 02/14/21 Yes Lance Muss, FNP  acetaminophen (TYLENOL) 500 MG tablet Take 1,000 mg by mouth every 6 (six) hours as needed  for mild pain.    [provider]  amLODipine (NORVASC) 5 MG tablet TAKE ONE TABLET BY MOUTH ONCE DAILY 03/27/15   Tysinger, Kermit Balo, PA-C  Blood Glucose Monitoring Suppl (GLUCOCOM BLOOD GLUCOSE MONITOR) DEVI Please check blood sugar 5 times per day due to fluctuating blood sugars. 05/31/16   [provider]  carvedilol (COREG) 25 MG tablet TAKE ONE TABLET BY MOUTH TWICE DAILY 03/27/15   Tysinger, Kermit Balo, PA-C  gabapentin (NEURONTIN) 100 MG  capsule Take 1 capsule (100 mg total) by mouth 3 (three) times daily. 06/10/14   Tysinger, Kermit Balo, PA-C  glucose blood (TRUE METRIX BLOOD GLUCOSE TEST) test strip Please check blood sugar 5 times per day due to fluctuating blood sugars. 05/31/16   [provider]  HYDROmorphone (DILAUDID) 2 MG tablet Take 1-3 tablets (2-6 mg total) by mouth every 4 (four) hours as needed for severe pain. 11/06/16   Perkins, Alexzandrew L, PA-C  Insulin Glargine (LANTUS SOLOSTAR) 100 UNIT/ML Solostar Pen Inject 15 Units into the skin 2 (two) times daily. Patient taking differently: Inject 15 Units into the skin at bedtime.  12/28/14   Ollen Gross, MD  metFORMIN (GLUCOPHAGE) 1000 MG tablet Take 1 tablet (1,000 mg total) by mouth daily. Patient taking differently: Take 1,000 mg by mouth 2 (two) times daily with a meal.  12/28/14   Aluisio, Homero Fellers, MD  methocarbamol (ROBAXIN) 500 MG tablet Take 1 tablet (500 mg total) by mouth every 6 (six) hours as needed for muscle spasms. 11/06/16   Perkins, Alexzandrew L, PA-C  pregabalin (LYRICA) 50 MG capsule Take 1 capsule (50 mg total) by mouth 2 (two) times daily. 06/13/16   Kirsteins, Victorino Sparrow, MD  rivaroxaban (XARELTO) 10 MG TABS tablet Take 1 tablet (10 mg total) by mouth daily with breakfast. Take Xarelto for two and a half more weeks following discharge from the hospital, then discontinue Xarelto. Once the patient has completed the blood thinner regimen, then take a Baby 81 mg Aspirin daily for three more weeks. 11/06/16   Perkins, Alexzandrew L, PA-C  rosuvastatin (CRESTOR) 5 MG tablet Take 5 mg by mouth every evening.    [provider]  traMADol (ULTRAM) 50 MG tablet Take 1-2 tablets (50-100 mg total) by mouth every 6 (six) hours as needed for moderate pain. 11/06/16   Perkins, Alexzandrew L, PA-C    Family History Family History  Problem Relation Age of Onset   Diabetes Mother    Other Father        death by MVA   Other Brother        died of MVA    Stroke Neg Hx    Heart disease Neg Hx    Cancer Neg Hx    Hypertension Neg Hx     Social History Social History   Tobacco Use   Smoking status: Former    Types: Cigarettes    Quit date: 11/11/2001    Years since quitting: 19.2   Smokeless tobacco: Never  Substance Use Topics   Alcohol use: No   Drug use: No     Allergies   Diclofenac sodium, Lisinopril, Losartan, and Voltaren [diclofenac]   Review of Systems Review of Systems Per HPI  Physical Exam Triage Vital Signs ED Triage Vitals  Enc Vitals Group     BP 02/04/21 1645 (!) 142/91     Pulse Rate 02/04/21 1645 99     Resp 02/04/21 1645 18     Temp 02/04/21 1645 98.9  F (37.2 C)     Temp Source 02/04/21 1645 Oral     SpO2 02/04/21 1645 100 %     Weight --      Height --      Head Circumference --      Peak Flow --      Pain Score 02/04/21 1644 9     Pain Loc --      Pain Edu? --      Excl. in GC? --    No data found.  Updated Vital Signs BP (!) 142/91 (BP Location: Right Arm)   Pulse 99   Temp 98.9 F (37.2 C) (Oral)   Resp 18   SpO2 100%   Visual Acuity Right Eye Distance:   Left Eye Distance:   Bilateral Distance:    Right Eye Near:   Left Eye Near:    Bilateral Near:     Physical Exam Constitutional:      Appearance: Normal appearance.  HENT:     Head: Normocephalic and atraumatic.     Mouth/Throat:     Mouth: Mucous membranes are moist.     Dentition: Abnormal dentition. Has dentures. Dental tenderness, gingival swelling and dental abscesses present.     Pharynx: Oropharynx is clear. No posterior oropharyngeal erythema.     Comments: Dental abscess present to right upper gingiva and dentition.  Mild gingival swelling present to this location as well.  Patient has partial denture plate present to left upper mouth. Eyes:     Extraocular Movements: Extraocular movements intact.     Conjunctiva/sclera: Conjunctivae normal.  Pulmonary:     Effort: Pulmonary effort is normal.   Neurological:     General: No focal deficit present.     Mental Status: He is alert and oriented to person, place, and time. Mental status is at baseline.  Psychiatric:        Mood and Affect: Mood normal.        Behavior: Behavior normal.        Thought Content: Thought content normal.        Judgment: Judgment normal.     UC Treatments / Results  Labs (all labs ordered are listed, but only abnormal results are displayed) Labs Reviewed - No data to display  EKG   Radiology No results found.  Procedures Procedures (including critical care time)  Medications Ordered in UC Medications  ketorolac (TORADOL) injection 30 mg (30 mg Intramuscular Given 02/04/21 1718)    Initial Impression / Assessment and Plan / UC Course  I have reviewed the triage vital signs and the nursing notes.  Pertinent labs & imaging results that were available during my care of the patient were reviewed by me and considered in my medical decision making (see chart for details).     Will treat dental abscess with amoxicillin x10 days.  Patient was advised of the importance of follow-up with a dentist.  Advised patient to use warm compresses to affected area of face.  Ketorolac injection administered in urgent care today for pain.  Advised patient to not take any over-the-counter NSAIDs for at least 24 hours following injection.  May take Tylenol as needed for pain.Discussed strict return precautions. Patient verbalized understanding and is agreeable with plan.  Final Clinical Impressions(s) / UC Diagnoses   Final diagnoses:  Dental abscess  Pain, dental     Discharge Instructions      You have been prescribed amoxicillin antibiotic to take for dental abscess.  Please  follow-up with dentist soon as possible for further evaluation and management.  You were given ketorolac injection in urgent care today for pain.  Please avoid taking over-the-counter ibuprofen, Advil, Aleve for at least 24 hours  following injection.     ED Prescriptions     Medication Sig Dispense Auth. Provider   amoxicillin (AMOXIL) 875 MG tablet Take 1 tablet (875 mg total) by mouth 2 (two) times daily for 10 days. 20 tablet Lance Muss, FNP      PDMP not reviewed this encounter.   Lance Muss, FNP 02/04/21 1719

## 2021-02-04 NOTE — Discharge Instructions (Addendum)
You have been prescribed amoxicillin antibiotic to take for dental abscess.  Please follow-up with dentist soon as possible for further evaluation and management.  You were given ketorolac injection in urgent care today for pain.  Please avoid taking over-the-counter ibuprofen, Advil, Aleve for at least 24 hours following injection.

## 2021-02-04 NOTE — ED Triage Notes (Signed)
Pt present right side dental pain with an abscess. Symptom started two days ago. Pt states he having trouble chewing and swallowing.

## 2021-08-06 DIAGNOSIS — F321 Major depressive disorder, single episode, moderate: Secondary | ICD-10-CM | POA: Insufficient documentation

## 2021-08-12 ENCOUNTER — Other Ambulatory Visit: Payer: Self-pay

## 2021-08-12 ENCOUNTER — Encounter (HOSPITAL_COMMUNITY): Payer: Self-pay

## 2021-08-12 ENCOUNTER — Emergency Department (HOSPITAL_COMMUNITY)
Admission: EM | Admit: 2021-08-12 | Discharge: 2021-08-13 | Disposition: A | Discharge status: Home / Self Care | Payer: Medicare HMO | Attending: Emergency Medicine | Admitting: Emergency Medicine

## 2021-08-12 DIAGNOSIS — Z7901 Long term (current) use of anticoagulants: Secondary | ICD-10-CM | POA: Insufficient documentation

## 2021-08-12 DIAGNOSIS — M79651 Pain in right thigh: Secondary | ICD-10-CM | POA: Diagnosis present

## 2021-08-12 DIAGNOSIS — K610 Anal abscess: Secondary | ICD-10-CM | POA: Diagnosis not present

## 2021-08-12 LAB — CBG MONITORING, ED: Glucose-Capillary: 130 mg/dL — ABNORMAL HIGH (ref 70–99)

## 2021-08-12 MED ORDER — HYDROCODONE-ACETAMINOPHEN 5-325 MG PO TABS
2.0000 | ORAL_TABLET | Freq: Once | ORAL | Status: AC
Start: 2021-08-12 — End: 2021-08-12
  Administered 2021-08-12: 2 via ORAL
  Filled 2021-08-12: qty 2

## 2021-08-12 MED ORDER — LIDOCAINE-EPINEPHRINE-TETRACAINE (LET) TOPICAL GEL
3.0000 mL | Freq: Once | TOPICAL | Status: AC
Start: 2021-08-12 — End: 2021-08-12
  Administered 2021-08-12: 3 mL via TOPICAL
  Filled 2021-08-12: qty 3

## 2021-08-12 MED ORDER — LIDOCAINE-EPINEPHRINE (PF) 2 %-1:200000 IJ SOLN
10.0000 mL | Freq: Once | INTRAMUSCULAR | Status: AC
  Administered 2021-08-12: 10 mL
  Filled 2021-08-12: qty 20

## 2021-08-12 NOTE — ED Provider Notes (Signed)
[facility] DEPT Provider Note   CSN: [id number] Arrival date & time: 08/12/21  2141     History  Chief Complaint  Patient presents with   Abscess    Jerry [name] is a 66 y.o. male.  66 year old male presents to the emergency department for evaluation of abscess to his right inner buttock x2 days.  Symptoms have been gradually worsening.  He reports pain when sitting and attempting to have a bowel movement.  He has not had any drainage from the area.  Reports having an abscess in a similar location in 2014 which was drained in the ED.  He has not had any fevers.  Is anticoagulated on Xarelto.  The history is provided by the patient. No language interpreter was used.  Abscess     Home Medications Prior to Admission medications   Medication Sig Start Date End Date Taking? Authorizing Provider  amoxicillin-clavulanate (AUGMENTIN) 875-125 MG tablet Take 1 tablet by mouth every 12 (twelve) hours. 08/13/21  Yes Antonietta Breach, PA-C  docusate sodium (COLACE) 100 MG capsule Take 1 capsule (100 mg total) by mouth every 12 (twelve) hours. 08/13/21  Yes Antonietta Breach, PA-C  acetaminophen (TYLENOL) 500 MG tablet Take 1,000 mg by mouth every 6 (six) hours as needed for mild pain.    [provider]  amLODipine (NORVASC) 5 MG tablet TAKE ONE TABLET BY MOUTH ONCE DAILY 03/27/15   Tysinger, Camelia Eng, PA-C  Blood Glucose Monitoring Suppl (GLUCOCOM BLOOD GLUCOSE MONITOR) DEVI Please check blood sugar 5 times per day due to fluctuating blood sugars. 05/31/16   [provider]  carvedilol (COREG) 25 MG tablet TAKE ONE TABLET BY MOUTH TWICE DAILY 03/27/15   Tysinger, Camelia Eng, PA-C  gabapentin (NEURONTIN) 100 MG capsule Take 1 capsule (100 mg total) by mouth 3 (three) times daily. 06/10/14   Tysinger, Camelia Eng, PA-C  glucose blood (TRUE METRIX BLOOD GLUCOSE TEST) test strip Please check blood sugar 5 times per day due to fluctuating blood sugars. 05/31/16   [provider]  HYDROmorphone (DILAUDID) 2 MG tablet Take 1-3 tablets (2-6 mg total) by mouth every 4 (four) hours as needed for severe pain. 11/06/16   Perkins, Alexzandrew L, PA-C  Insulin Glargine (LANTUS SOLOSTAR) 100 UNIT/ML Solostar Pen Inject 15 Units into the skin 2 (two) times daily. Patient taking differently: Inject 15 Units into the skin at bedtime.  12/28/14   Gaynelle Arabian, MD  metFORMIN (GLUCOPHAGE) 1000 MG tablet Take 1 tablet (1,000 mg total) by mouth daily. Patient taking differently: Take 1,000 mg by mouth 2 (two) times daily with a meal.  12/28/14   Aluisio, Pilar Plate, MD  methocarbamol (ROBAXIN) 500 MG tablet Take 1 tablet (500 mg total) by mouth every 6 (six) hours as needed for muscle spasms. 11/06/16   Perkins, Alexzandrew L, PA-C  pregabalin (LYRICA) 50 MG capsule Take 1 capsule (50 mg total) by mouth 2 (two) times daily. 06/13/16   Kirsteins, Luanna Salk, MD  rivaroxaban (XARELTO) 10 MG TABS tablet Take 1 tablet (10 mg total) by mouth daily with breakfast. Take Xarelto for two and a half more weeks following discharge from the hospital, then discontinue Xarelto. Once the patient has completed the blood thinner regimen, then take a Baby 81 mg Aspirin daily for three more weeks. 11/06/16   Perkins, Alexzandrew L, PA-C  rosuvastatin (CRESTOR) 5 MG tablet Take 5 mg by mouth every evening.    [provider]  traMADol (ULTRAM) 50 MG tablet  Take 1-2 tablets (50-100 mg total) by mouth every 6 (six) hours as needed for moderate pain. 11/06/16   Perkins, Alexzandrew L, PA-C      Allergies    Diclofenac sodium, Lisinopril, Losartan, and Voltaren [diclofenac]    Review of Systems   Review of Systems Ten systems reviewed and are negative for acute change, except as noted in the HPI.    Physical Exam Updated Vital Signs BP (!) 129/93 (BP Location: Left Arm)    Pulse (!) 106    Temp 98 F (36.7 C) (Oral)    Resp 18    Ht 6\' 4"  (1.93 m)    Wt 127 kg    SpO2 96%    BMI 34.08 kg/m    Physical Exam Vitals and nursing note reviewed.  Constitutional:      General: He is not in acute distress.    Appearance: He is well-developed. He is not diaphoretic.     Comments: Nontoxic appearing and in NAD  HENT:     Head: Normocephalic and atraumatic.  Eyes:     General: No scleral icterus.    Conjunctiva/sclera: Conjunctivae normal.  Pulmonary:     Effort: Pulmonary effort is normal. No respiratory distress.     Comments: Respirations even and unlabored Genitourinary:      Comments: Tender, fluctuant perianal abscess noted  Musculoskeletal:        General: Normal range of motion.     Cervical back: Normal range of motion.  Skin:    General: Skin is warm and dry.     Coloration: Skin is not pale.     Findings: No erythema or rash.  Neurological:     Mental Status: He is alert and oriented to person, place, and time.  Psychiatric:        Behavior: Behavior normal.    ED Results / Procedures / Treatments   Labs (all labs ordered are listed, but only abnormal results are displayed) Labs Reviewed  CBG MONITORING, ED - Abnormal; Notable for the following components:      Result Value   Glucose-Capillary 130 (*)    All other components within normal limits    EKG None  Radiology No results found.  Procedures .Marland KitchenIncision and Drainage  Date/Time: 08/13/2021 12:20 AM Performed by: Antonietta Breach, PA-C Authorized by: Antonietta Breach, PA-C   Consent:    Consent obtained:  Verbal and emergent situation   Consent given by:  Patient   Risks, benefits, and alternatives were discussed: yes     Risks discussed:  Bleeding, incomplete drainage and pain   Alternatives discussed:  Observation Universal protocol:    Test results available : yes     Imaging studies available: yes     Required blood products, implants, devices, and special equipment available: yes     Immediately prior to procedure, a time out was called: yes     Patient identity confirmed:  Verbally with  patient and arm band Location:    Type:  Abscess   Size:  2cm x 4cm fluctuance   Location:  Anogenital   Anogenital location:  Perianal Pre-procedure details:    Skin preparation:  Povidone-iodine Anesthesia:    Anesthesia method:  Topical application and local infiltration   Topical anesthetic:  LET   Local anesthetic:  Lidocaine 2% WITH epi Procedure type:    Complexity:  Complex Procedure details:    Needle aspiration: yes     Needle gauge: 21G.   Incision types:  Single straight   Wound management:  Probed and deloculated, irrigated with saline and extensive cleaning   Drainage:  Bloody and purulent   Wound treatment:  Wound left open   Packing materials:  None Post-procedure details:    Procedure completion:  Tolerated well, no immediate complications Comments:     Perianal abscess with blood and purulent drainage. Purulence amount approximated at 15cc.    Medications Ordered in ED Medications  amoxicillin-clavulanate (AUGMENTIN) 875-125 MG per tablet 1 tablet (has no administration in time range)  lidocaine-EPINEPHrine (XYLOCAINE W/EPI) 2 %-1:200000 (PF) injection 10 mL (10 mLs Infiltration Given 08/12/21 2317)  lidocaine-EPINEPHrine-tetracaine (LET) topical gel (3 mLs Topical Given 08/12/21 2316)  HYDROcodone-acetaminophen (NORCO/VICODIN) 5-325 MG per tablet 2 tablet (2 tablets Oral Given 08/12/21 2342)    ED Course/ Medical Decision Making/ A&P                           Medical Decision Making Risk OTC drugs. Prescription drug management.   Patient with perianal abscess amenable to incision and drainage.  Pain improved post procedure. Patient tolerated well w/o immediate complications. Advised wound recheck in 2 days; referral given to [facility] for evaluation/wound recheck in their day clinic. Encouraged home sitz baths and use of Colace to prevent constipation/straining.  Mild signs of cellulitis is surrounding skin.  Will d/c to home with Augmentin.  Return precautions  discussed and provided. Patient discharged in stable condition with no unaddressed concerns.         Final Clinical Impression(s) / ED Diagnoses Final diagnoses:  Perianal abscess    Rx / DC Orders ED Discharge Orders          Ordered    amoxicillin-clavulanate (AUGMENTIN) 875-125 MG tablet  Every 12 hours        08/13/21 0010    docusate sodium (COLACE) 100 MG capsule  Every 12 hours        08/13/21 0010              Antonietta Breach, PA-C [phone] [id number]    Delora Fuel, MD 0000000 [phone]

## 2021-08-12 NOTE — ED Triage Notes (Signed)
Pt complains of abscess to right inner buttock x 2 days.

## 2021-08-13 MED ORDER — AMOXICILLIN-POT CLAVULANATE 875-125 MG PO TABS: 1.0000 | ORAL_TABLET | Freq: Two times a day (BID) | ORAL | 0 refills | Status: DC

## 2021-08-13 MED ORDER — DOCUSATE SODIUM 100 MG PO CAPS
100.0000 mg | ORAL_CAPSULE | Freq: Two times a day (BID) | ORAL | 0 refills | Status: DC
Start: 2021-08-13 — End: 2024-03-11

## 2021-08-13 MED ORDER — AMOXICILLIN-POT CLAVULANATE 875-125 MG PO TABS
1.0000 | ORAL_TABLET | Freq: Once | ORAL | Status: AC
  Administered 2021-08-13: 1 via ORAL
  Filled 2021-08-13: qty 1

## 2021-08-13 NOTE — Discharge Instructions (Addendum)
Take Augmentin as prescribed until finished.  We recommend frequent sits baths to promote drainage.  Use Colace to promote soft stool and prevent constipation which may worsen pain or postprocedural bleeding.  You have been given a referral to [facility] to have this area rechecked in 2 days. Call in the morning to schedule an appointment for their day clinic.  Return to the ED for new or concerning symptoms.

## 2021-08-13 NOTE — ED Notes (Signed)
Pt discharged. Instructions and prescriptions given. AAOX4. Pt in no apparent distress with mild pain. The opportunity to ask questions was provided.  

## 2021-10-20 ENCOUNTER — Inpatient Hospital Stay (HOSPITAL_COMMUNITY): Payer: Medicare HMO

## 2021-10-20 ENCOUNTER — Emergency Department (HOSPITAL_COMMUNITY): Payer: Medicare HMO

## 2021-10-20 ENCOUNTER — Other Ambulatory Visit: Payer: Self-pay

## 2021-10-20 ENCOUNTER — Encounter (HOSPITAL_COMMUNITY): Payer: Self-pay | Admitting: *Deleted

## 2021-10-20 ENCOUNTER — Inpatient Hospital Stay (HOSPITAL_COMMUNITY)
Admission: EM | Admit: 2021-10-20 | Discharge: 2021-10-22 | DRG: 040 | Disposition: A | Discharge status: Home / Self Care | Payer: Medicare HMO | Attending: Internal Medicine | Admitting: Internal Medicine

## 2021-10-20 DIAGNOSIS — Z8673 Personal history of transient ischemic attack (TIA), and cerebral infarction without residual deficits: Secondary | ICD-10-CM | POA: Diagnosis present

## 2021-10-20 DIAGNOSIS — G9341 Metabolic encephalopathy: Secondary | ICD-10-CM | POA: Diagnosis present

## 2021-10-20 DIAGNOSIS — Z716 Tobacco abuse counseling: Secondary | ICD-10-CM

## 2021-10-20 DIAGNOSIS — I10 Essential (primary) hypertension: Secondary | ICD-10-CM | POA: Diagnosis present

## 2021-10-20 DIAGNOSIS — Z794 Long term (current) use of insulin: Secondary | ICD-10-CM | POA: Diagnosis not present

## 2021-10-20 DIAGNOSIS — Z888 Allergy status to other drugs, medicaments and biological substances status: Secondary | ICD-10-CM | POA: Diagnosis not present

## 2021-10-20 DIAGNOSIS — E785 Hyperlipidemia, unspecified: Secondary | ICD-10-CM | POA: Diagnosis present

## 2021-10-20 DIAGNOSIS — E1165 Type 2 diabetes mellitus with hyperglycemia: Secondary | ICD-10-CM | POA: Diagnosis present

## 2021-10-20 DIAGNOSIS — I6389 Other cerebral infarction: Secondary | ICD-10-CM | POA: Diagnosis not present

## 2021-10-20 DIAGNOSIS — Z79899 Other long term (current) drug therapy: Secondary | ICD-10-CM

## 2021-10-20 DIAGNOSIS — F17210 Nicotine dependence, cigarettes, uncomplicated: Secondary | ICD-10-CM | POA: Diagnosis present

## 2021-10-20 DIAGNOSIS — Z7982 Long term (current) use of aspirin: Secondary | ICD-10-CM

## 2021-10-20 DIAGNOSIS — I700 Atherosclerosis of aorta: Secondary | ICD-10-CM | POA: Diagnosis present

## 2021-10-20 DIAGNOSIS — I6340 Cerebral infarction due to embolism of unspecified cerebral artery: Principal | ICD-10-CM | POA: Diagnosis present

## 2021-10-20 DIAGNOSIS — Z7984 Long term (current) use of oral hypoglycemic drugs: Secondary | ICD-10-CM | POA: Diagnosis not present

## 2021-10-20 DIAGNOSIS — Z79890 Hormone replacement therapy (postmenopausal): Secondary | ICD-10-CM

## 2021-10-20 DIAGNOSIS — E871 Hypo-osmolality and hyponatremia: Secondary | ICD-10-CM | POA: Diagnosis present

## 2021-10-20 DIAGNOSIS — R270 Ataxia, unspecified: Secondary | ICD-10-CM | POA: Diagnosis present

## 2021-10-20 DIAGNOSIS — I639 Cerebral infarction, unspecified: Secondary | ICD-10-CM | POA: Diagnosis present

## 2021-10-20 DIAGNOSIS — G8314 Monoplegia of lower limb affecting left nondominant side: Secondary | ICD-10-CM | POA: Diagnosis present

## 2021-10-20 DIAGNOSIS — Z833 Family history of diabetes mellitus: Secondary | ICD-10-CM

## 2021-10-20 DIAGNOSIS — I6521 Occlusion and stenosis of right carotid artery: Secondary | ICD-10-CM | POA: Diagnosis present

## 2021-10-20 DIAGNOSIS — R29701 NIHSS score 1: Secondary | ICD-10-CM | POA: Diagnosis present

## 2021-10-20 DIAGNOSIS — Z96653 Presence of artificial knee joint, bilateral: Secondary | ICD-10-CM | POA: Diagnosis present

## 2021-10-20 DIAGNOSIS — Z20822 Contact with and (suspected) exposure to covid-19: Secondary | ICD-10-CM | POA: Diagnosis present

## 2021-10-20 DIAGNOSIS — Z6832 Body mass index (BMI) 32.0-32.9, adult: Secondary | ICD-10-CM | POA: Diagnosis not present

## 2021-10-20 LAB — I-STAT CHEM 8, ED
BUN: 13 mg/dL (ref 8–23)
Calcium, Ion: 1.17 mmol/L (ref 1.15–1.40)
Chloride: 97 mmol/L — ABNORMAL LOW (ref 98–111)
Creatinine, Ser: 0.9 mg/dL (ref 0.61–1.24)
Glucose, Bld: 163 mg/dL — ABNORMAL HIGH (ref 70–99)
HCT: 45 % (ref 39.0–52.0)
Hemoglobin: 15.3 g/dL (ref 13.0–17.0)
Potassium: 3.8 mmol/L (ref 3.5–5.1)
Sodium: 133 mmol/L — ABNORMAL LOW (ref 135–145)
TCO2: 23 mmol/L (ref 22–32)

## 2021-10-20 LAB — DIFFERENTIAL
Abs Immature Granulocytes: 0.02 10*3/uL (ref 0.00–0.07)
Basophils Absolute: 0 10*3/uL (ref 0.0–0.1)
Basophils Relative: 0 %
Eosinophils Absolute: 0.1 10*3/uL (ref 0.0–0.5)
Eosinophils Relative: 2 %
Immature Granulocytes: 0 %
Lymphocytes Relative: 37 %
Lymphs Abs: 2.5 10*3/uL (ref 0.7–4.0)
Monocytes Absolute: 0.6 10*3/uL (ref 0.1–1.0)
Monocytes Relative: 9 %
Neutro Abs: 3.6 10*3/uL (ref 1.7–7.7)
Neutrophils Relative %: 52 %

## 2021-10-20 LAB — ETHANOL: Alcohol, Ethyl (B): <10 mg/dL

## 2021-10-20 LAB — CBG MONITORING, ED: Glucose-Capillary: 306 mg/dL — ABNORMAL HIGH (ref 70–99)

## 2021-10-20 LAB — RESP PANEL BY RT-PCR (FLU A&B, COVID) ARPGX2
Influenza A by PCR: NEGATIVE
Influenza B by PCR: NEGATIVE
SARS Coronavirus 2 by RT PCR: NEGATIVE

## 2021-10-20 LAB — COMPREHENSIVE METABOLIC PANEL
ALT: 15 U/L (ref 0–44)
AST: 17 U/L (ref 15–41)
Albumin: 4.6 g/dL (ref 3.5–5.0)
Alkaline Phosphatase: 53 U/L (ref 38–126)
Anion gap: 12 (ref 5–15)
BUN: 15 mg/dL (ref 8–23)
CO2: 25 mmol/L (ref 22–32)
Calcium: 9.6 mg/dL (ref 8.9–10.3)
Chloride: 98 mmol/L (ref 98–111)
Creatinine, Ser: 1.08 mg/dL (ref 0.61–1.24)
GFR, Estimated: >60 mL/min (ref >60)
Glucose, Bld: 178 mg/dL — ABNORMAL HIGH (ref 70–99)
Potassium: 3.8 mmol/L (ref 3.5–5.1)
Sodium: 135 mmol/L (ref 135–145)
Total Bilirubin: 2.5 mg/dL — ABNORMAL HIGH (ref 0.3–1.2)
Total Protein: 8.4 g/dL — ABNORMAL HIGH (ref 6.5–8.1)

## 2021-10-20 MED ORDER — FENTANYL CITRATE PF 50 MCG/ML IJ SOSY
50.0000 ug | PREFILLED_SYRINGE | Freq: Once | INTRAMUSCULAR | Status: AC
  Administered 2021-10-20: 50 ug via INTRAVENOUS
  Filled 2021-10-20: qty 1

## 2021-10-20 MED ORDER — INSULIN ASPART 100 UNIT/ML IJ SOLN
0.0000 [IU] | Freq: Three times a day (TID) | INTRAMUSCULAR | Status: DC
  Administered 2021-10-21: 5 [IU] via SUBCUTANEOUS
  Administered 2021-10-21: 8 [IU] via SUBCUTANEOUS
  Administered 2021-10-22: 5 [IU] via SUBCUTANEOUS
  Administered 2021-10-22 (×2): 3 [IU] via SUBCUTANEOUS
  Filled 2021-10-20: qty 0.15

## 2021-10-20 MED ORDER — KETOROLAC TROMETHAMINE 15 MG/ML IJ SOLN
7.5000 mg | INTRAMUSCULAR | Status: AC
Start: 2021-10-20 — End: 2021-10-20
  Administered 2021-10-20: 7.5 mg via INTRAVENOUS
  Filled 2021-10-20: qty 1

## 2021-10-20 MED ORDER — METOCLOPRAMIDE HCL 5 MG/ML IJ SOLN
5.0000 mg | Freq: Once | INTRAMUSCULAR | Status: AC
  Administered 2021-10-20: 5 mg via INTRAVENOUS
  Filled 2021-10-20: qty 2

## 2021-10-20 MED ORDER — LACTATED RINGERS IV SOLN: INTRAVENOUS | Status: AC

## 2021-10-20 MED ORDER — SODIUM CHLORIDE (PF) 0.9 % IJ SOLN
INTRAMUSCULAR | Status: AC
  Filled 2021-10-20: qty 50

## 2021-10-20 MED ORDER — LABETALOL HCL 5 MG/ML IV SOLN: 5.0000 mg | INTRAVENOUS | Status: DC | PRN

## 2021-10-20 MED ORDER — INSULIN ASPART 100 UNIT/ML IJ SOLN
0.0000 [IU] | Freq: Every day | INTRAMUSCULAR | Status: DC
  Administered 2021-10-20: 4 [IU] via SUBCUTANEOUS
  Filled 2021-10-20: qty 0.05

## 2021-10-20 MED ORDER — ACETAMINOPHEN 325 MG PO TABS
650.0000 mg | ORAL_TABLET | Freq: Four times a day (QID) | ORAL | Status: DC | PRN
  Administered 2021-10-21 (×3): 650 mg via ORAL
  Filled 2021-10-20 (×3): qty 2

## 2021-10-20 MED ORDER — DEXAMETHASONE SODIUM PHOSPHATE 10 MG/ML IJ SOLN
10.0000 mg | Freq: Once | INTRAMUSCULAR | Status: AC
  Administered 2021-10-20: 10 mg via INTRAVENOUS
  Filled 2021-10-20: qty 1

## 2021-10-20 MED ORDER — LACTATED RINGERS IV SOLN: INTRAVENOUS | Status: DC

## 2021-10-20 MED ORDER — ONDANSETRON HCL 4 MG/2ML IJ SOLN: 4.0000 mg | Freq: Four times a day (QID) | INTRAMUSCULAR | Status: DC | PRN

## 2021-10-20 MED ORDER — HYDROMORPHONE HCL 1 MG/ML IJ SOLN
1.0000 mg | Freq: Once | INTRAMUSCULAR | Status: AC
  Administered 2021-10-20: 1 mg via INTRAVENOUS
  Filled 2021-10-20: qty 1

## 2021-10-20 MED ORDER — ASPIRIN EC 81 MG PO TBEC
81.0000 mg | DELAYED_RELEASE_TABLET | Freq: Every day | ORAL | Status: DC
  Administered 2021-10-20 – 2021-10-22 (×3): 81 mg via ORAL
  Filled 2021-10-20 (×3): qty 1

## 2021-10-20 MED ORDER — CLOPIDOGREL BISULFATE 75 MG PO TABS
75.0000 mg | ORAL_TABLET | Freq: Every day | ORAL | Status: DC
  Administered 2021-10-20 – 2021-10-22 (×3): 75 mg via ORAL
  Filled 2021-10-20 (×3): qty 1

## 2021-10-20 MED ORDER — ENOXAPARIN SODIUM 40 MG/0.4ML IJ SOSY
40.0000 mg | PREFILLED_SYRINGE | INTRAMUSCULAR | Status: DC
  Administered 2021-10-21 – 2021-10-22 (×2): 40 mg via SUBCUTANEOUS
  Filled 2021-10-20 (×2): qty 0.4

## 2021-10-20 MED ORDER — METOCLOPRAMIDE HCL 5 MG/ML IJ SOLN
5.0000 mg | INTRAMUSCULAR | Status: AC
  Administered 2021-10-20: 5 mg via INTRAVENOUS
  Filled 2021-10-20: qty 2

## 2021-10-20 MED ORDER — ATORVASTATIN CALCIUM 80 MG PO TABS
80.0000 mg | ORAL_TABLET | Freq: Every day | ORAL | Status: DC
  Administered 2021-10-20 – 2021-10-22 (×3): 80 mg via ORAL
  Filled 2021-10-20 (×2): qty 2
  Filled 2021-10-20: qty 1

## 2021-10-20 MED ORDER — SERTRALINE HCL 50 MG PO TABS
50.0000 mg | ORAL_TABLET | Freq: Every day | ORAL | Status: DC
  Administered 2021-10-20 – 2021-10-22 (×3): 50 mg via ORAL
  Filled 2021-10-20 (×3): qty 1

## 2021-10-20 MED ORDER — DIPHENHYDRAMINE HCL 50 MG/ML IJ SOLN
12.5000 mg | INTRAMUSCULAR | Status: AC
  Administered 2021-10-20: 12.5 mg via INTRAVENOUS
  Filled 2021-10-20: qty 1

## 2021-10-20 MED ORDER — POLYETHYLENE GLYCOL 3350 17 G PO PACK: 17.0000 g | PACK | Freq: Every day | ORAL | Status: DC | PRN

## 2021-10-20 MED ORDER — IOHEXOL 350 MG/ML SOLN
75.0000 mL | Freq: Once | INTRAVENOUS | Status: AC | PRN
  Administered 2021-10-20: 75 mL via INTRAVENOUS

## 2021-10-20 MED ORDER — MELATONIN 3 MG PO TABS
3.0000 mg | ORAL_TABLET | Freq: Every evening | ORAL | Status: DC | PRN
  Administered 2021-10-21: 3 mg via ORAL
  Filled 2021-10-20: qty 1

## 2021-10-20 NOTE — ED Notes (Signed)
Dr. Zenia Resides said the patient is not a code stroke. ?

## 2021-10-20 NOTE — ED Provider Notes (Signed)
?[facility]-EMERGENCY DEPT ?Provider Note ? ? ?CSN: [id number] ?Arrival date & time: 10/20/21  1422 ? ?  ? ?History ? ?Chief Complaint  ?Patient presents with  ? Numbness in legs  ? ? TIA  ? Altered Mental Status  ? ? ?Jerry MillingBarry D [name] is a 66 y.o. male. ? ?66 year old male presents with cute onset of right-sided headache that began 3 days ago.  States that the time he had trouble with left lower extremity weakness.  He has had some ataxia.  Has had nausea but no vomiting.  No slurred speech.  No facial droop noted.  No chest or abdominal discomfort.  No prior history of same.  Has been given aspirin by his wife.  Presents due to worsening symptoms.  Denies any fever or neck pain ? ? ?  ? ?Home Medications ?Prior to Admission medications   ?Medication Sig Start Date End Date Taking? Authorizing Provider  ?acetaminophen (TYLENOL) 500 MG tablet Take 1,000 mg by mouth every 6 (six) hours as needed for mild pain.    [provider]  ?amLODipine (NORVASC) 5 MG tablet TAKE ONE TABLET BY MOUTH ONCE DAILY 03/27/15   Tysinger, Kermit Baloavid S, PA-C  ?amoxicillin-clavulanate (AUGMENTIN) 875-125 MG tablet Take 1 tablet by mouth every 12 (twelve) hours. 08/13/21   Antony MaduraHumes, Kelly, PA-C  ?Blood Glucose Monitoring Suppl (GLUCOCOM BLOOD GLUCOSE MONITOR) DEVI Please check blood sugar 5 times per day due to fluctuating blood sugars. 05/31/16   [provider]  ?carvedilol (COREG) 25 MG tablet TAKE ONE TABLET BY MOUTH TWICE DAILY 03/27/15   Tysinger, Kermit Baloavid S, PA-C  ?docusate sodium (COLACE) 100 MG capsule Take 1 capsule (100 mg total) by mouth every 12 (twelve) hours. 08/13/21   Antony MaduraHumes, Kelly, PA-C  ?gabapentin (NEURONTIN) 100 MG capsule Take 1 capsule (100 mg total) by mouth 3 (three) times daily. 06/10/14   Tysinger, Kermit Baloavid S, PA-C  ?glucose blood (TRUE METRIX BLOOD GLUCOSE TEST) test strip Please check blood sugar 5 times per day due to fluctuating blood sugars. 05/31/16   [provider]  ?HYDROmorphone  (DILAUDID) 2 MG tablet Take 1-3 tablets (2-6 mg total) by mouth every 4 (four) hours as needed for severe pain. 11/06/16   Perkins, Alexzandrew L, PA-C  ?Insulin Glargine (LANTUS SOLOSTAR) 100 UNIT/ML Solostar Pen Inject 15 Units into the skin 2 (two) times daily. ?Patient taking differently: Inject 15 Units into the skin at bedtime.  12/28/14   Ollen GrossAluisio, Frank, MD  ?metFORMIN (GLUCOPHAGE) 1000 MG tablet Take 1 tablet (1,000 mg total) by mouth daily. ?Patient taking differently: Take 1,000 mg by mouth 2 (two) times daily with a meal.  12/28/14   Ollen GrossAluisio, Frank, MD  ?methocarbamol (ROBAXIN) 500 MG tablet Take 1 tablet (500 mg total) by mouth every 6 (six) hours as needed for muscle spasms. 11/06/16   Perkins, Alexzandrew L, PA-C  ?pregabalin (LYRICA) 50 MG capsule Take 1 capsule (50 mg total) by mouth 2 (two) times daily. 06/13/16   Kirsteins, Victorino SparrowAndrew E, MD  ?rivaroxaban (XARELTO) 10 MG TABS tablet Take 1 tablet (10 mg total) by mouth daily with breakfast. Take Xarelto for two and a half more weeks following discharge from the hospital, then discontinue Xarelto. ?Once the patient has completed the blood thinner regimen, then take a Baby 81 mg Aspirin daily for three more weeks. 11/06/16   Perkins, Alexzandrew L, PA-C  ?rosuvastatin (CRESTOR) 5 MG tablet Take 5 mg by mouth every evening.    [provider]  ?traMADol Janean Sark(ULTRAM)  50 MG tablet Take 1-2 tablets (50-100 mg total) by mouth every 6 (six) hours as needed for moderate pain. 11/06/16   Perkins, Alexzandrew L, PA-C  ?   ? ?Allergies    ?Diclofenac sodium, Lisinopril, Losartan, and Voltaren [diclofenac]   ? ?Review of Systems   ?Review of Systems  ?All other systems reviewed and are negative. ? ?Physical Exam ?Updated Vital Signs ?BP (!) 145/86   Pulse 92   Temp 98.9 ?F (37.2 ?C) (Oral)   Resp 15   Ht 1.93 m ( )   Wt 120.2 kg   SpO2 98%   BMI 32.26 kg/m?  ?Physical Exam ?Vitals and nursing note reviewed.  ?Constitutional:   ?   General: He is not in acute  distress. ?   Appearance: Normal appearance. He is well-developed. He is not toxic-appearing.  ?HENT:  ?   Head: Normocephalic and atraumatic.  ?Eyes:  ?   General: Lids are normal.  ?   Conjunctiva/sclera: Conjunctivae normal.  ?   Pupils: Pupils are equal, round, and reactive to light.  ?Neck:  ?   Thyroid: No thyroid mass.  ?   Trachea: No tracheal deviation.  ?Cardiovascular:  ?   Rate and Rhythm: Normal rate and regular rhythm.  ?   Heart sounds: Normal heart sounds. No murmur heard. ?  No gallop.  ?Pulmonary:  ?   Effort: Pulmonary effort is normal. No respiratory distress.  ?   Breath sounds: Normal breath sounds. No stridor. No decreased breath sounds, wheezing, rhonchi or rales.  ?Abdominal:  ?   General: There is no distension.  ?   Palpations: Abdomen is soft.  ?   Tenderness: There is no abdominal tenderness. There is no rebound.  ?Musculoskeletal:     ?   General: No tenderness. Normal range of motion.  ?   Cervical back: Normal range of motion and neck supple.  ?Skin: ?   General: Skin is warm and dry.  ?   Findings: No abrasion or rash.  ?Neurological:  ?   Mental Status: He is alert and oriented to person, place, and time. Mental status is at baseline.  ?   GCS: GCS eye subscore is 4. GCS verbal subscore is 5. GCS motor subscore is 6.  ?   Cranial Nerves: No cranial nerve deficit.  ?   Sensory: No sensory deficit.  ?   Motor: Motor function is intact.  ?Psychiatric:     ?   Attention and Perception: Attention normal.     ?   Speech: Speech normal.     ?   Behavior: Behavior normal.  ? ? ?ED Results / Procedures / Treatments   ?Labs ?(all labs ordered are listed, but only abnormal results are displayed) ?Labs Reviewed  ?RESP PANEL BY RT-PCR (FLU A&B, COVID) ARPGX2  ?COMPREHENSIVE METABOLIC PANEL  ?ETHANOL  ?PROTIME-INR  ?APTT  ?DIFFERENTIAL  ?RAPID URINE DRUG SCREEN, HOSP PERFORMED  ?URINALYSIS, ROUTINE W REFLEX MICROSCOPIC  ?I-STAT CHEM 8, ED  ? ? ?EKG ?EKG Interpretation ? ?Date/Time:  Saturday  October 20 2021 16:51:59 EDT ?Ventricular Rate:  90 ?PR Interval:  152 ?QRS Duration: 88 ?QT Interval:  374 ?QTC Calculation: 458 ?R Axis:   76 ?Text Interpretation: Sinus rhythm Probable left atrial enlargement Borderline repolarization abnormality Confirmed by Lorre Nick ([id number]) on 10/20/2021 5:24:31 PM ? ?Radiology ?No results found. ? ?Procedures ?Procedures  ? ? ?Medications Ordered in ED ?Medications  ?lactated ringers infusion (has no administration in time range)  ?  metoCLOPramide (REGLAN) injection 5 mg (has no administration in time range)  ? ? ?ED Course/ Medical Decision Making/ A&P ?  ?                        ?Medical Decision Making ?Amount and/or Complexity of Data Reviewed ?Labs: ordered. ?Radiology: ordered. ?ECG/medicine tests: ordered. ? ?Risk ?Prescription drug management. ?Decision regarding hospitalization. ? ? ?Patient presented with right-sided headache with associate left-sided weakness.  Concern for possible CVA.  Patient medicated with IV medications for his headache.  Head CT showed questionable infarct per my review and interpretation.  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  Patient is slightly hypertensive here but does not require aggressive management currently at this time.  EKG per my interpretation shows sinus rhythm.  Case discussed with Dr. Amada Jupiter who is on-call for neurology.  I have ordered CT angio of head and neck.  Discussed with Dr. Margo Aye from hospitalist service and patient will be admitted to [facility].  Patient's wife informed of his clinical status at bedside ? ?CRITICAL CARE ?Performed by: Toy Baker ?Total critical care time: 55 minutes ?Critical care time was exclusive of separately billable procedures and treating other patients. ?Critical care was necessary to treat or prevent imminent or life-threatening deterioration. ?Critical care was time spent personally by me on the  following activities: development of treatment plan with patient and/or surrogate as well as nursing, discussions with consultants, evaluation of patient's response to treatment, examination of patient, obtaining hist

## 2021-10-20 NOTE — H&P (Signed)
?History and Physical ? ?Jerry MillingBarry D Delapena [id number] DOB: July 22, 1955 DOA: 10/20/2021 ? ?Referring physician: Dr. Freida BusmanAllen, EDP  ?PCP: [name], [name], PA  ?Outpatient Specialists: None ?Patient coming from: Home. ? ?Chief Complaint: Worsening left lower extremity weakness, ataxia, headache for 3 days ? ?HPI: Jerry MillingBarry D [name] is a 66 y.o. male with medical history significant for obesity, essential hypertension, type 2 diabetes, hyperlipidemia, not on chronic aspirin, post bilateral knee replacement was on Xarelto briefly for DVT prophylaxis, who [facility], brought in by his wife due to worsening left lower extremity weakness, ataxia, moderate and persistent right-sided headache, with onset 3 days prior to presentation.  Associated with increased somnolence and intermittent mild confusion.  No slurred speech no facial droop.  No dysphagia.  No prior history of stroke or TIA.  He received aspirin by his wife today for his headache.  In the ED, his blood pressure is elevated.  CT head revealed no acute intracranial abnormalities, but showed subacute to chronic cortical infarct within the posterior right parietal lobe, chronic microvascular disease.  MRI brain revealed acute/subacute nonhemorrhagic infarct involving the right caudate head, right parietal lobe, and right frontal lobe.  Posterior parietal infarcts may represent watershed infarct.  Remote nonhemorrhagic lacunar infarcts of the right lentiform nucleus and right thalamus.  EDP discussed case with neurology/stroke team who [facility].  Patient admitted by hospitalist service, TR[facility]. ? ?ED Course:  Tmax 98.9.  BP 151/101, pulse 91, respiratory 17, saturation 97% on room air.  Lab studies remarkable for serum sodium 133, glucose 163, CBC is essentially unremarkable. ? ?Review of Systems: ?Review of systems as noted in the HPI. All other systems reviewed and are negative. ? ? ?Past Medical  History:  ?Diagnosis Date  ? Arthritis   ? Diabetes mellitus 2008  ? type II  ? Dyslipidemia   ? Erectile dysfunction   ? History of cardiac catheterization 06/2013  ? Dr. Peter SwazilandJordan  ? HTN (hypertension)   ? Hypogonadism male   ? ?Past Surgical History:  ?Procedure Laterality Date  ? APPENDECTOMY    ? COLONOSCOPY  08/2006  ? Dr. Russella DarStark - repeat 2018  ? INCISION AND DRAINAGE PERIRECTAL ABSCESS    ? KNEE ARTHROSCOPY Left 12/28/2014  ? Procedure: ARTHROSCOPY LEFT KNEE WITH  MEDIAL MENSICUS DEBRIDEMENT;  Surgeon: Ollen GrossFrank Aluisio, MD;  Location: [facility] ORS;  Service: Orthopedics;  Laterality: Left;  ? KNEE SURGERY    ? left knee - remote past  ? LEFT HEART CATHETERIZATION WITH CORONARY ANGIOGRAM N/A 06/11/2013  ? Procedure: LEFT HEART CATHETERIZATION WITH CORONARY ANGIOGRAM;  Surgeon: Peter M SwazilandJordan, MD;  [facility];  Service: Cardiovascular;  Laterality: N/A;  ? right arm and elbow surgery    ? TONSILLECTOMY    ? TOTAL KNEE ARTHROPLASTY Left 10/04/2015  ? Procedure: LEFT TOTAL KNEE ARTHROPLASTY right knee aspiration and injection;  Surgeon: Ollen GrossFrank Aluisio, MD;  Location: [facility] ORS;  Service: Orthopedics;  Laterality: Left;  ? TOTAL KNEE ARTHROPLASTY Right 11/04/2016  ? Procedure: RIGHT TOTAL KNEE ARTHROPLASTY;  Surgeon: Ollen GrossFrank Aluisio, MD;  Location: [facility] ORS;  Service: Orthopedics;  Laterality: Right;  with abductor block  ? wisdom teeth extractions    ? ? ?Social History:  reports that he quit smoking about 19 years ago. His smoking use included cigarettes. He has never used smokeless tobacco. He reports that he does not drink alcohol and does not use drugs. ? ? ?Allergies  ?  Allergen Reactions  ? Diclofenac Sodium Other (See Comments)  ?  Gel caused a burning sensation   ? Lisinopril Cough  ? Losartan Cough  ?  Unknown bad side effects ?Unknown bad side effects  ? Voltaren [Diclofenac] Other (See Comments)  ?  Gel caused a burning sensation   ? ? ?Family History  ?Problem Relation Age of Onset  ? Diabetes Mother   ? Other  Father   ?     death by MVA  ? Other Brother   ?     died of MVA  ? Stroke Neg Hx   ? Heart disease Neg Hx   ? Cancer Neg Hx   ? Hypertension Neg Hx   ?  ? ? ?Prior to Admission medications   ?Medication Sig Start Date End Date Taking? Authorizing Provider  ?acetaminophen (TYLENOL) 500 MG tablet Take 1,000 mg by mouth every 6 (six) hours as needed for mild pain.   Yes [provider]  ?amLODipine (NORVASC) 10 MG tablet Take 10 mg by mouth daily. 10/11/21  Yes [provider]  ?aspirin EC 81 MG tablet Take 81 mg by mouth once. Swallow whole.   Yes [provider]  ?docusate sodium (COLACE) 100 MG capsule Take 1 capsule (100 mg total) by mouth every 12 (twelve) hours. ?Patient taking differently: Take 100 mg by mouth 2 (two) times daily as needed for moderate constipation. 08/13/21  Yes Antony Madura, PA-C  ?hydrochlorothiazide (HYDRODIURIL) 25 MG tablet Take 25 mg by mouth daily. 10/11/21  Yes [provider]  ?Insulin Glargine (LANTUS SOLOSTAR) 100 UNIT/ML Solostar Pen Inject 15 Units into the skin 2 (two) times daily. ?Patient taking differently: Inject 15 Units into the skin at bedtime. 12/28/14  Yes Aluisio, Homero Fellers, MD  ?metFORMIN (GLUCOPHAGE) 1000 MG tablet Take 1 tablet (1,000 mg total) by mouth daily. ?Patient taking differently: Take 1,000 mg by mouth 2 (two) times daily with a meal. 12/28/14  Yes Aluisio, Homero Fellers, MD  ?rosuvastatin (CRESTOR) 20 MG tablet Take 20 mg by mouth at bedtime. 10/11/21  Yes [provider]  ?sertraline (ZOLOFT) 50 MG tablet Take 50 mg by mouth daily. 10/11/21  Yes [provider]  ?TRESIBA FLEXTOUCH 100 UNIT/ML FlexTouch Pen Inject 30 Units into the skin every morning. 06/14/21  Yes [provider]  ?amLODipine (NORVASC) 5 MG tablet TAKE ONE TABLET BY MOUTH ONCE DAILY ?Patient not taking: Reported on 10/20/2021 03/27/15   Tysinger, Kermit Balo, PA-C  ?amoxicillin-clavulanate (AUGMENTIN) 875-125 MG tablet Take 1 tablet by mouth every 12  (twelve) hours. ?Patient not taking: Reported on 10/20/2021 08/13/21   Antony Madura, PA-C  ?Blood Glucose Monitoring Suppl (GLUCOCOM BLOOD GLUCOSE MONITOR) DEVI Please check blood sugar 5 times per day due to fluctuating blood sugars. 05/31/16   [provider]  ?carvedilol (COREG) 25 MG tablet TAKE ONE TABLET BY MOUTH TWICE DAILY ?Patient not taking: Reported on 10/20/2021 03/27/15   Tysinger, Kermit Balo, PA-C  ?gabapentin (NEURONTIN) 100 MG capsule Take 1 capsule (100 mg total) by mouth 3 (three) times daily. ?Patient not taking: Reported on 10/20/2021 06/10/14   Tysinger, Kermit Balo, PA-C  ?glucose blood test strip Please check blood sugar 5 times per day due to fluctuating blood sugars. 05/31/16   [provider]  ?HYDROmorphone (DILAUDID) 2 MG tablet Take 1-3 tablets (2-6 mg total) by mouth every 4 (four) hours as needed for severe pain. ?Patient not taking: Reported on 10/20/2021 11/06/16   Julien Girt, Alexzandrew L, PA-C  ?methocarbamol (ROBAXIN)  500 MG tablet Take 1 tablet (500 mg total) by mouth every 6 (six) hours as needed for muscle spasms. ?Patient not taking: Reported on 10/20/2021 11/06/16   Julien Girt, Alexzandrew L, PA-C  ?pregabalin (LYRICA) 50 MG capsule Take 1 capsule (50 mg total) by mouth 2 (two) times daily. ?Patient not taking: Reported on 10/20/2021 06/13/16   Erick Colace, MD  ?rivaroxaban (XARELTO) 10 MG TABS tablet Take 1 tablet (10 mg total) by mouth daily with breakfast. Take Xarelto for two and a half more weeks following discharge from the hospital, then discontinue Xarelto. ?Once the patient has completed the blood thinner regimen, then take a Baby 81 mg Aspirin daily for three more weeks. ?Patient not taking: Reported on 10/20/2021 11/06/16   Julien Girt, Alexzandrew L, PA-C  ?traMADol (ULTRAM) 50 MG tablet Take 1-2 tablets (50-100 mg total) by mouth every 6 (six) hours as needed for moderate pain. ?Patient not taking: Reported on 10/20/2021 11/06/16   Julien Girt, Alexzandrew L, PA-C   ? ? ?Physical Exam: ?BP (!) 151/101   Pulse 91   Temp 98.9 ?F (37.2 ?C) (Oral)   Resp 17   Ht 6\' 4"  (1.93 m)   Wt 120.2 kg   SpO2 97%   BMI 32.26 kg/m?  ? ?General: 66 y.o. year-old male well developed well nourished in no a

## 2021-10-20 NOTE — ED Triage Notes (Signed)
Pt told wife today he felt like he had a "Mini Stroke" on Thursday, feels numbness in legs, able to ambulate. Continues to have a headache that has been constant since Thursday, pt sleeping more and is more confused.  ?

## 2021-10-21 ENCOUNTER — Other Ambulatory Visit: Payer: Self-pay

## 2021-10-21 ENCOUNTER — Inpatient Hospital Stay (HOSPITAL_COMMUNITY): Payer: Medicare HMO

## 2021-10-21 DIAGNOSIS — I639 Cerebral infarction, unspecified: Secondary | ICD-10-CM

## 2021-10-21 DIAGNOSIS — I6389 Other cerebral infarction: Secondary | ICD-10-CM | POA: Diagnosis not present

## 2021-10-21 LAB — CBC
HCT: 43.2 % (ref 39.0–52.0)
Hemoglobin: 16 g/dL (ref 13.0–17.0)
MCH: 34.2 pg — ABNORMAL HIGH (ref 26.0–34.0)
MCHC: 37 g/dL — ABNORMAL HIGH (ref 30.0–36.0)
MCV: 92.3 fL (ref 80.0–100.0)
Platelets: 257 K/uL (ref 150–400)
RBC: 4.68 MIL/uL (ref 4.22–5.81)
RDW: 11.5 % (ref 11.5–15.5)
WBC: 7.8 K/uL (ref 4.0–10.5)
nRBC: 0 % (ref 0.0–0.2)

## 2021-10-21 LAB — ECHOCARDIOGRAM COMPLETE BUBBLE STUDY
AR max vel: 3.78 cm2
AV Area VTI: 3.34 cm2
AV Area mean vel: 3.39 cm2
AV Mean grad: 3 mmHg
AV Peak grad: 5.2 mmHg
Ao pk vel: 1.14 m/s
Area-P 1/2: 3.83 cm2
S' Lateral: 2.8 cm

## 2021-10-21 LAB — HIV ANTIBODY (ROUTINE TESTING W REFLEX): HIV Screen 4th Generation wRfx: NONREACTIVE

## 2021-10-21 LAB — GLUCOSE, CAPILLARY
Glucose-Capillary: 229 mg/dL — ABNORMAL HIGH (ref 70–99)
Glucose-Capillary: 168 mg/dL — ABNORMAL HIGH (ref 70–99)

## 2021-10-21 LAB — LIPID PANEL
Cholesterol: 163 mg/dL (ref 0–200)
HDL: 58 mg/dL (ref >40)
LDL Cholesterol: 88 mg/dL (ref 0–99)
Total CHOL/HDL Ratio: 2.8 RATIO
Triglycerides: 84 mg/dL (ref <150)
VLDL: 17 mg/dL (ref 0–40)

## 2021-10-21 LAB — RENAL FUNCTION PANEL
Albumin: 4.1 g/dL (ref 3.5–5.0)
Anion gap: 11 (ref 5–15)
BUN: 23 mg/dL (ref 8–23)
CO2: 23 mmol/L (ref 22–32)
Calcium: 9.7 mg/dL (ref 8.9–10.3)
Chloride: 97 mmol/L — ABNORMAL LOW (ref 98–111)
Creatinine, Ser: 1.15 mg/dL (ref 0.61–1.24)
GFR, Estimated: >60 mL/min
Glucose, Bld: 265 mg/dL — ABNORMAL HIGH (ref 70–99)
Phosphorus: 2.3 mg/dL — ABNORMAL LOW (ref 2.5–4.6)
Potassium: 4 mmol/L (ref 3.5–5.1)
Sodium: 131 mmol/L — ABNORMAL LOW (ref 135–145)

## 2021-10-21 LAB — HEMOGLOBIN A1C
Hgb A1c MFr Bld: 6.2 % — ABNORMAL HIGH (ref 4.8–5.6)
Mean Plasma Glucose: 131.24 mg/dL

## 2021-10-21 LAB — PROTIME-INR
INR: 1.1 (ref 0.8–1.2)
Prothrombin Time: 13.6 seconds (ref 11.4–15.2)

## 2021-10-21 LAB — APTT: aPTT: 33 seconds (ref 24–36)

## 2021-10-21 LAB — MAGNESIUM: Magnesium: 1.6 mg/dL — ABNORMAL LOW (ref 1.7–2.4)

## 2021-10-21 MED ORDER — INSULIN ASPART 100 UNIT/ML IJ SOLN
3.0000 [IU] | Freq: Three times a day (TID) | INTRAMUSCULAR | Status: DC
  Administered 2021-10-21 – 2021-10-22 (×4): 3 [IU] via SUBCUTANEOUS

## 2021-10-21 MED ORDER — INSULIN GLARGINE-YFGN 100 UNIT/ML ~~LOC~~ SOLN
5.0000 [IU] | Freq: Every day | SUBCUTANEOUS | Status: DC
  Administered 2021-10-21: 5 [IU] via SUBCUTANEOUS
  Filled 2021-10-21 (×2): qty 0.05

## 2021-10-21 MED ORDER — NICOTINE 21 MG/24HR TD PT24
21.0000 mg | MEDICATED_PATCH | Freq: Every day | TRANSDERMAL | Status: DC
  Administered 2021-10-21: 21 mg via TRANSDERMAL
  Filled 2021-10-21 (×2): qty 1

## 2021-10-21 MED ORDER — MAGNESIUM SULFATE 2 GM/50ML IV SOLN
2.0000 g | Freq: Once | INTRAVENOUS | Status: AC
Start: 2021-10-21 — End: 2021-10-21
  Administered 2021-10-21: 2 g via INTRAVENOUS
  Filled 2021-10-21: qty 50

## 2021-10-21 MED ORDER — CLOPIDOGREL BISULFATE 75 MG PO TABS
225.0000 mg | ORAL_TABLET | Freq: Once | ORAL | Status: AC
  Administered 2021-10-21: 225 mg via ORAL
  Filled 2021-10-21: qty 3

## 2021-10-21 NOTE — Progress Notes (Signed)
SLP Cancellation Note ? ?Patient Details ?Name: Jerry MillingBarry D [name] ?MRN: [id number] ?DOB: 1956/05/01 ? ? ?Cancelled treatment:       Reason Eval/Treat Not Completed: SLP screened, no needs identified, will sign off; discussed BSE with nursing d/t pt being on diet and passing [facility] swallow screen. ? ? ?Tressie StalkerPat Jacky Dross, M.S., CCC-SLP ?10/21/2021, 3:36 PM ?

## 2021-10-21 NOTE — Evaluation (Signed)
Occupational Therapy Evaluation ?Patient Details ?Name: Jerry [name] ?MRN: [id number] ?DOB: 1955/12/08 ?Today's Date: 10/21/2021 ? ? ?History of Present Illness Pt is a 66 y/o male presenting 4/15 to [facility] with worsening L LE weakness, ataxia and HA x 3 days.   MRI brain revealed acute/subacute nonhemorrhagic infarct involving the right caudate head, right parietal lobe, and right frontal lobe.  Posterior parietal infarcts may represent watershed infarct.  Remote nonhemorrhagic lacunar infarcts of the right lentiform nucleus and right thalamus.  PMHx:  DM, Dyslipidemia, HTN, Cath  ? ?Clinical Impression ?  ?Pt admitted for concerns listed above. PTA Pt reported that he was independent with all ADL's and IADL's. At this time pt appears at his baseline, Balance and strength are WNL. Additionally, pt's vision and cognition are at baseline Jerry [name]. He has no further need for acute OT. OT will sign off.   ?   ? ?Recommendations for follow up therapy are one component of a multi-disciplinary discharge planning process, led by the attending physician.  Recommendations may be updated based on patient status, additional functional criteria and insurance authorization.  ? ?Follow Up Recommendations ? No OT follow up  ?  ?Assistance Recommended at Discharge None  ?Patient can return home with the following   ? ?  ?Functional Status Assessment ? Patient has had a recent decline in their functional status and demonstrates the ability to make significant improvements in function in a reasonable and predictable amount of time.  ?Equipment Recommendations ? None recommended by OT  ?  ?Recommendations for Other Services   ? ? ?  ?Precautions / Restrictions Precautions ?Precautions: Fall ?Restrictions ?Weight Bearing Restrictions: No  ? ?  ? ?Mobility Bed Mobility ?Overal bed mobility: Modified Independent ?  ?  ?  ?  ?  ?  ?  ?  ? ?Transfers ?Overall transfer level: Modified independent ?  ?  ?  ?  ?  ?  ?  ?  ?  ?  ? ?  ?Balance  Overall balance assessment: Modified Independent ?  ?  ?  ?  ?  ?  ?  ?  ?  ?  ?  ?  ?  ?  ?  ?  ?  ?  ?   ? ?ADL either performed or assessed with clinical judgement  ? ?ADL Overall ADL's : Independent;At baseline ?  ?  ?  ?  ?  ?  ?  ?  ?  ?  ?  ?  ?  ?  ?  ?  ?  ?  ?  ?   ? ? ? ?Vision Baseline Vision/History: 0 No visual deficits ?Ability to See in Adequate Light: 0 Adequate ?Patient Visual Report: No change from baseline ?Vision Assessment?: No apparent visual deficits  ?   ?Perception   ?  ?Praxis   ?  ? ?Pertinent Vitals/Pain Pain Assessment ?Pain Assessment: No/denies pain  ? ? ? ?Hand Dominance Right ?  ?Extremity/Trunk Assessment Upper Extremity Assessment ?Upper Extremity Assessment: Overall WFL for tasks assessed ?  ?Lower Extremity Assessment ?Lower Extremity Assessment: Overall WFL for tasks assessed ?  ?Cervical / Trunk Assessment ?Cervical / Trunk Assessment: Normal ?  ?Communication Communication ?Communication: No difficulties ?  ?Cognition Arousal/Alertness: Awake/alert ?Behavior During Therapy: [name] for tasks assessed/performed ?Overall Cognitive Status: Within Functional Limits for tasks assessed ?  ?  ?  ?  ?  ?  ?  ?  ?  ?  ?  ?  ?  ?  ?  ?  ?  ?  ?  ?  General Comments  VSS on RA ? ?  ?Exercises   ?  ?Shoulder Instructions    ? ? ?Home Living Family/patient expects to be discharged to:: Private residence ?Living Arrangements: Spouse/significant other ?Available Help at Discharge: Family;Available 24 hours/day ?Type of Home: House ?Home Access: Stairs to enter ?  ?  ?Home Layout: Two level;Bed/bath upstairs ?Alternate Level Stairs-Number of Steps: flight ?Alternate Level Stairs-Rails: Right;Left ?Bathroom Shower/Tub: Walk-in shower ?  ?Bathroom Toilet: Handicapped height ?  ?  ?Home Equipment: None ?  ?  ?  ? ?  ?Prior Functioning/Environment Prior Level of Function : Independent/Modified Independent ?  ?  ?  ?  ?  ?  ?  ?  ?  ? ?  ?  ?OT Problem List: Decreased strength;Decreased activity  tolerance;Impaired balance (sitting and/or standing) ?  ?   ?OT Treatment/Interventions:    ?  ?OT Goals(Current goals can be found in the care plan section) Acute Rehab OT Goals ?Patient Stated Goal: To go home ?OT Goal Formulation: All assessment and education complete, DC therapy ?Time For Goal Achievement: 10/21/21 ?Potential to Achieve Goals: Good  ?OT Frequency:   ?  ? ?Co-evaluation   ?  ?  ?  ?  ? ?  ?AM-PAC OT "6 Clicks" Daily Activity     ?Outcome Measure Help from another person eating meals?: None ?Help from another person taking care of personal grooming?: None ?Help from another person toileting, which includes using toliet, bedpan, or urinal?: None ?Help from another person bathing (including washing, rinsing, drying)?: None ?Help from another person to put on and taking off regular upper body clothing?: None ?Help from another person to put on and taking off regular lower body clothing?: None ?6 Click Score: 24 ?  ?End of Session Nurse Communication: Mobility status ? ?Activity Tolerance: Patient tolerated treatment well ?Patient left: in bed;with call bell/phone within reach;with family/visitor present ? ?OT Visit Diagnosis: Unsteadiness on feet (R26.81);Other abnormalities of gait and mobility (R26.89);Muscle weakness (generalized) (M62.81)  ?              ?Time: [id number] ?OT Time Calculation (min): 23 min ?Charges:  OT General Charges ?$OT Visit: 1 Visit ?OT Evaluation ?$OT Eval Moderate Complexity: 1 Mod ?OT Treatments ?$Self Care/Home Management : 8-22 mins ? ?Madisynn Plair H., OTR/L ?Acute Rehabilitation ? ?Abril Cappiello Elane Yolanda Bonine ?10/21/2021, 7:23 PM ?

## 2021-10-21 NOTE — Progress Notes (Signed)
[facility] ? ?PROGRESS NOTE ? ?JAMESEN SCALONE [id number] DOB: 07/10/1955 DOA: 10/20/2021 ?PCP: Pieter Partridge, PA ? ? ?Brief HPI:   ?66 year old male with medical history of obesity, essential hypertension, diabetes mellitus type 2, hyperlipidemia, post bilateral knee replacement was on Xarelto briefly for DVT prophylaxis came to ED from home due to worsening left lower extremity weakness, ataxia, moderate and persistent right-sided headache, associated with increased somnolence and mild confusion.  No slurred speech or facial droop.  No dysphagia.  He was given aspirin by his wife for headache.  CT head revealed no acute abnormality but showed subacute to chronic cortical infarct within the posterior right parietal lobe, chronic microvascular disease.  MRI brain showed acute/subacute nonhemorrhagic infarct involving the right caudate head right middle lobe and right frontal lobe. ?Neurology was consulted.  Patient found to be outside tPA window. ? ? ?Subjective  ? ?Patient seen and examined, denies any complaints.  Moving all extremities.  Awaiting to go to [facility]. ? ? Assessment/Plan:  ? ? ?Acute CVA ?-Seen on MRI brain ?-2D echo and CTA head and neck ordered ?-Neurochecks every 4 hours ?-PT OT evaluation ?-Permissive hypertension ?-Started on aspirin and Plavix for 90 days ?-High intensity statin, Lipitor 80 mg daily ?-Stroke team to follow ? ?Persistent headache ?-Likely from CVA ?-Continue as needed Tylenol ? ?Acute metabolic encephalopathy ?-Resolved ?-Likely from above ? ?Diabetes mellitus type 2 ?-Continue sliding scale insulin with NovoLog ?-We will start Lantus 5 units subcu daily ? ?Hyperlipidemia ?-Continue Lipitor 80 mg daily ? ?Hypertension ?-Permissive hypertension ?-Treat for blood pressure 220/120 ? ?Obesity ?-BMI 32.26 kg/m? ?-Recommend weight loss as outpatient with regular physical activity and healthy diet ? ? ?Medications ? ?  ? aspirin EC  81 mg Oral Daily  ? atorvastatin  80  mg Oral Daily  ? clopidogrel  75 mg Oral Daily  ? enoxaparin (LOVENOX) injection  40 mg Subcutaneous Q24H  ? insulin aspart  0-15 Units Subcutaneous TID WC  ? insulin aspart  0-5 Units Subcutaneous QHS  ? sertraline  50 mg Oral Daily  ? ? ? Data Reviewed:  ? ?CBG: ? ?Recent Labs  ?Lab 10/20/21 ?2159  ?GLUCAP 306*  ? ? ?SpO2: 95 %  ? ? ?Vitals:  ? 10/21/21 0830 10/21/21 0900 10/21/21 0930 10/21/21 1000  ?BP: (!) 197/119 (!) 202/126 (!) 169/121 (!) 168/96  ?Pulse: 90 93 93 96  ?Resp: 17 16 17 19   ?Temp:      ?TempSrc:      ?SpO2: (!) 89% 96% 96% 95%  ?Weight:      ?Height:      ? ? ? ? ?Data Reviewed: ? ?Basic Metabolic Panel: ?Recent Labs  ?Lab 10/20/21 ?1516 10/20/21 ?1722 10/21/21 ?0405  ?NA 135 133* 131*  ?K 3.8 3.8 4.0  ?CL 98 97* 97*  ?CO2 25  --  23  ?GLUCOSE 178* 163* 265*  ?BUN 15 13 23   ?CREATININE 1.08 0.90 1.15  ?CALCIUM 9.6  --  9.7  ?MG  --   --  1.6*  ?PHOS  --   --  2.3*  ? ? ?CBC: ?Recent Labs  ?Lab 10/20/21 ?1526 10/20/21 ?1722 10/21/21 ?0405  ?WBC  --   --  7.8  ?NEUTROABS 3.6  --   --   ?HGB  --  15.3 16.0  ?HCT  --  45.0 43.2  ?MCV  --   --  92.3  ?PLT  --   --  257  ? ? ?  LFT ?Recent Labs  ?Lab 10/20/21 ?1516 10/21/21 ?0405  ?AST 17  --   ?ALT 15  --   ?ALKPHOS 53  --   ?BILITOT 2.5*  --   ?PROT 8.4*  --   ?ALBUMIN 4.6 4.1  ? ?  ?Antibiotics: ?Anti-infectives (From admission, onward)  ? ? None  ? ?  ? ? ? ?DVT prophylaxis: Lovenox ? ?Code Status: Full code ? ?Family Communication: No family at bedside ? ? ?CONSULTS neurology ? ? ?Objective  ? ? ?Physical Examination: ? ?General-appears in no acute distress ?Heart-S1-S2, regular, no murmur auscultated ?Lungs-clear to auscultation bilaterally, no wheezing or crackles auscultated ?Abdomen-soft, nontender, no organomegaly ?Extremities-no edema in the lower extremities ?Neuro-alert, oriented x3, no focal deficit noted ? ? ?Status is: Inpatient: Stroke ? ? ? ?  ? ? ?Oswald Hillock ?  ?[facility] ?If 7PM-7AM, please contact night-coverage at  [email or web address], ?Office  [phone] ? ? ?10/21/2021, 10:43 AM  LOS: 1 day  ? ? ? ? ? ? ? ? ? ? ?  ?

## 2021-10-21 NOTE — Consult Note (Signed)
Neurology Consultation ?Reason for Consult: Stroke ?Referring Physician: Nevada Crane, C ? ?CC: Left leg weakness ? ?History is obtained from: Patient, chart review ? ?HPI: Jerry [name] is a 66 y.o. male with a history of diabetes, hypertension, dyslipidemia, who presents with leg weakness for the past few days. He states that it started 4 days ago and has been improving since that time. He came in to be evaluated for it and an MRI was performed which demonstrates several strokes on the right. He has some old strokes, all on the right.   ? ? ?LKW: 4/12 ?tpa given?: no, out of window.  ? ? ? ?ROS: A 14 point ROS was performed and is negative except as noted in the HPI.  ? ?Past Medical History:  ?Diagnosis Date  ? Arthritis   ? Diabetes mellitus 2008  ? type II  ? Dyslipidemia   ? Erectile dysfunction   ? History of cardiac catheterization 06/2013  ? Dr. Peter Martinique  ? HTN (hypertension)   ? Hypogonadism male   ? ? ? ?Family History  ?Problem Relation Age of Onset  ? Diabetes Mother   ? Other Father   ?     death by MVA  ? Other Brother   ?     died of MVA  ? Stroke Neg Hx   ? Heart disease Neg Hx   ? Cancer Neg Hx   ? Hypertension Neg Hx   ? ? ? ?Social History:  reports that he quit smoking about 19 years ago. His smoking use included cigarettes. He has never used smokeless tobacco. He reports that he does not drink alcohol and does not use drugs. ? ? ?Exam: ?Current vital signs: ?BP (!) 163/140   Pulse (!) 116   Temp 98.9 ?F (37.2 ?C) (Oral)   Resp (!) 23   Ht 6\' 4"  (1.93 m)   Wt 120.2 kg   SpO2 92%   BMI 32.26 kg/m?  ?Vital signs in last 24 hours: ?Temp:  [98.9 ?F (37.2 ?C)] 98.9 ?F (37.2 ?C) (04/15 1450) ?Pulse Rate:  [88-116] 116 (04/16 0530) ?Resp:  [11-23] 23 (04/16 0530) ?BP: (135-182)/(86-140) 163/140 (04/16 0530) ?SpO2:  [90 %-100 %] 92 % (04/16 0530) ?Weight:  [120.2 kg] 120.2 kg (04/15 1453) ? ? ?Physical Exam  ?Constitutional: Appears well-developed and well-nourished.  ?Psych: Affect appropriate to  situation ?Eyes: No scleral injection ?HENT: No OP obstruction ?MSK: no joint deformities.  ?Cardiovascular: Normal rate and regular rhythm.  ?Respiratory: Effort normal, non-labored breathing ?GI: Soft.  No distension. There is no tenderness.  ?Skin: WDI ? ?Neuro: ?Mental Status: ?Patient is awake, alert, oriented to person, place, month, year, and situation. ?Patient is able to give a clear and coherent history. ?No signs of aphasia or neglect ?Cranial Nerves: ?II: Visual Fields are full. Pupils are equal, round, and reactive to light.   ?III,IV, VI: EOMI without ptosis or diploplia.  ?V: Facial sensation is symmetric to temperature ?VII: Facial movement is symmetric.  ?VIII: hearing is intact to voice ?X: Uvula elevates symmetrically ?XI: Shoulder shrug is symmetric. ?XII: tongue is midline without atrophy or fasciculations.  ?Motor: ?Tone is normal. Bulk is normal. 5/5 strength was present in all four extremities.  ?Sensory: ?Sensation is symmetric to light touch and temperature in the arms and legs. ?Plantars: ?Toes are downgoing bilaterally.  ?Cerebellar: ?FNF and HKS are intact bilaterally, though FNF slightly slower on the left.  ? ? ? ? ?I have reviewed labs in epic and  the results pertinent to this consultation are: ?LDL 76 ?A1c Pending ?Cr 1.15 ? ?I have reviewed the images obtained: MRI brain - embolic appearing strokes in the right hemisphere.  ?CTA head and neck-  ? 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 He has moderate atheromatous change in the right carotid siphon, and this could be a source of artery to artery embolus, but this is not an area typically very amenable to intervention.  ? ?Recommendations: ?- HgbA1c, fasting lipid panel ?- Frequent neuro checks ?- Echocardiogram ?- Prophylactic therapy-Antiplatelet med: Aspirin - dose 81mg  and plavix 75mg  daily for 90 days, will give extra 225mg  to complete load.  ?- Risk factor modification ?- Telemetry monitoring ?- PT consult, OT  consult, Speech consult ?- increase crestor to 40mg  qhs ?- Stroke team to follow ? ? ?Roland Rack, MD ?[facility] ?[phone] ? ?If 7pm- 7am, please page neurology on call as listed in [place]. ? ?

## 2021-10-21 NOTE — Evaluation (Signed)
Physical Therapy Evaluation ?Patient Details ?Name: Jerry [name] ?MRN: [id number] ?DOB: 02-Oct-1955 ?Today's Date: 10/21/2021 ? ?History of Present Illness ? Pt is a 66 y/o male presenting 4/15 to [facility] with worsening L LE weakness, ataxia and HA x 3 days.   MRI brain revealed acute/subacute nonhemorrhagic infarct involving the right caudate head, right parietal lobe, and right frontal lobe.  Posterior parietal infarcts may represent watershed infarct.  Remote nonhemorrhagic lacunar infarcts of the right lentiform nucleus and right thalamus.  PMHx:  DM, Dyslipidemia, HTN, Cath  ?Clinical Impression ? Pt is close to baseline functioning and should be safe at home with wife's PRN assist. There are no further acute PT needs.  Will sign off at this time. ?   ?   ? ?Recommendations for follow up therapy are one component of a multi-disciplinary discharge planning process, led by the attending physician.  Recommendations may be updated based on patient status, additional functional criteria and insurance authorization. ? ?Follow Up Recommendations No PT follow up ? ?  ?Assistance Recommended at Discharge PRN  ?Patient can return home with the following ?   ? ?  ?Equipment Recommendations None recommended by PT  ?Recommendations for Other Services ?    ?  ?Functional Status Assessment Patient has had a recent decline in their functional status and demonstrates the ability to make significant improvements in function in a reasonable and predictable amount of time.  ? ?  ?Precautions / Restrictions Precautions ?Precautions: Fall  ? ?  ? ?Mobility ? Bed Mobility ?Overal bed mobility: Modified Independent ?  ?  ?  ?  ?  ?  ?  ?  ? ?Transfers ?Overall transfer level: Modified independent ?  ?  ?  ?  ?  ?  ?  ?  ?  ?  ? ?Ambulation/Gait ?Ambulation/Gait assistance: Supervision ?Gait Distance (Feet): 500 Feet ?Assistive device: None ?Gait Pattern/deviations: Step-through pattern ?Gait velocity: age appropriate ?Gait velocity  interpretation: >4.37 ft/sec, indicative of normal walking speed ?  ?General Gait Details: steady, able to manage moderate balance change without deviation or LOB ? ?Stairs ?Stairs: Yes ?Stairs assistance: Modified independent (Device/Increase time) ?Stair Management: One rail Right, Alternating pattern, Forwards ?Number of Stairs: 3 ?General stair comments: safe with rails ? ?Wheelchair Mobility ?  ? ?Modified Rankin (Stroke Patients Only) ?Modified Rankin (Stroke Patients Only) ?Pre-Morbid Rankin Score: No symptoms ?Modified Rankin: Slight disability ? ?  ? ?Balance Overall balance assessment: Modified Independent ?  ?  ?  ?  ?  ?  ?  ?  ?  ?  ?  ?  ?  ?  ?  ?  ?  ?  ?   ? ? ? ?Pertinent Vitals/Pain Pain Assessment ?Pain Assessment: No/denies pain  ? ? ?Home Living Family/patient expects to be discharged to:: Private residence ?Living Arrangements: Spouse/significant other ?Available Help at Discharge: Family;Available 24 hours/day ?Type of Home: House ?Home Access: Stairs to enter ?  ?  ?Alternate Level Stairs-Number of Steps: flight ?Home Layout: Two level;Bed/bath upstairs ?  ?   ?  ?Prior Function Prior Level of Function : Independent/Modified Independent ?  ?  ?  ?  ?  ?  ?  ?  ?  ? ? ?Hand Dominance  ? Dominant Hand: Right ? ?  ?Extremity/Trunk Assessment  ? Upper Extremity Assessment ?Upper Extremity Assessment: Overall WFL for tasks assessed ?  ? ?Lower Extremity Assessment ?Lower Extremity Assessment: Overall WFL for tasks assessed ?  ? ?Cervical /  Trunk Assessment ?Cervical / Trunk Assessment: Normal  ?Communication  ? Communication: No difficulties  ?Cognition Arousal/Alertness: Awake/alert ?Behavior During Therapy: [facility] for tasks assessed/performed ?Overall Cognitive Status: Within Functional Limits for tasks assessed ?  ?  ?  ?  ?  ?  ?  ?  ?  ?  ?  ?  ?  ?  ?  ?  ?  ?  ?  ? ?  ?General Comments General comments (skin integrity, edema, etc.): Discussed finding out and moderating Risk factors.   Discussed and pt/wife verbalized understancing of BE FAST ? ?  ?Exercises    ? ?Assessment/Plan  ?  ?PT Assessment Patient does not need any further PT services  ?PT Problem List   ? ?   ?  ?PT Treatment Interventions     ? ?PT Goals (Current goals can be found in the Care Plan section)  ?Acute Rehab PT Goals ?Patient Stated Goal: get home ?PT Goal Formulation: With patient ? ?  ?Frequency   ?  ? ? ?Co-evaluation   ?  ?  ?  ?  ? ? ?  ?AM-PAC PT "6 Clicks" Mobility  ?Outcome Measure Help needed turning from your back to your side while in a flat bed without using bedrails?: None ?Help needed moving from lying on your back to sitting on the side of a flat bed without using bedrails?: None ?Help needed moving to and from a bed to a chair (including a wheelchair)?: None ?Help needed standing up from a chair using your arms (e.g., wheelchair or bedside chair)?: None ?Help needed to walk in hospital room?: None ?Help needed climbing 3-5 steps with a railing? : None ?6 Click Score: 24 ? ?  ?End of Session   ?Activity Tolerance: Patient tolerated treatment well ?Patient left: in bed;with call bell/phone within reach;with restraints reapplied ?Nurse Communication: Mobility status ?PT Visit Diagnosis: Other symptoms and signs involving the nervous system (R29.898) ?  ? ?Time: 4098-1191 ?PT Time Calculation (min) (ACUTE ONLY): 25 min ? ? ?Charges:   PT Evaluation ?$PT Eval Low Complexity: 1 Low ?PT Treatments ?$Gait Training: 8-22 mins ?  ?   ? ? ?10/21/2021 ? ?Jacinto Halim., PT ?Acute Rehabilitation Services ?[phone]  (pager) ?[phone]  (office) ? ?Eliseo Gum [place] ?10/21/2021, 5:25 PM ? ?

## 2021-10-22 ENCOUNTER — Inpatient Hospital Stay (HOSPITAL_COMMUNITY): Payer: Medicare HMO

## 2021-10-22 ENCOUNTER — Encounter (HOSPITAL_COMMUNITY)
Admission: EM | Disposition: A | Discharge status: Home / Self Care | Payer: Self-pay | Source: Home / Self Care | Attending: Internal Medicine

## 2021-10-22 DIAGNOSIS — I639 Cerebral infarction, unspecified: Secondary | ICD-10-CM

## 2021-10-22 HISTORY — PX: LOOP RECORDER INSERTION: EP1214

## 2021-10-22 LAB — BASIC METABOLIC PANEL
Anion gap: 9 (ref 5–15)
BUN: 19 mg/dL (ref 8–23)
CO2: 23 mmol/L (ref 22–32)
Calcium: 9.5 mg/dL (ref 8.9–10.3)
Chloride: 100 mmol/L (ref 98–111)
Creatinine, Ser: 1.22 mg/dL (ref 0.61–1.24)
GFR, Estimated: >60 mL/min (ref >60)
Glucose, Bld: 216 mg/dL — ABNORMAL HIGH (ref 70–99)
Potassium: 3.5 mmol/L (ref 3.5–5.1)
Sodium: 132 mmol/L — ABNORMAL LOW (ref 135–145)

## 2021-10-22 LAB — GLUCOSE, CAPILLARY
Glucose-Capillary: 188 mg/dL — ABNORMAL HIGH (ref 70–99)
Glucose-Capillary: 171 mg/dL — ABNORMAL HIGH (ref 70–99)
Glucose-Capillary: 210 mg/dL — ABNORMAL HIGH (ref 70–99)

## 2021-10-22 SURGERY — LOOP RECORDER INSERTION

## 2021-10-22 MED ORDER — AMLODIPINE BESYLATE 10 MG PO TABS: 10.0000 mg | ORAL_TABLET | Freq: Every day | ORAL | 2 refills | Status: DC

## 2021-10-22 MED ORDER — LIDOCAINE-EPINEPHRINE 1 %-1:100000 IJ SOLN
INTRAMUSCULAR | Status: DC | PRN
  Administered 2021-10-22: 30 mL

## 2021-10-22 MED ORDER — LIDOCAINE-EPINEPHRINE 1 %-1:100000 IJ SOLN
INTRAMUSCULAR | Status: AC
  Filled 2021-10-22: qty 1

## 2021-10-22 MED ORDER — ROSUVASTATIN CALCIUM 20 MG PO TABS
40.0000 mg | ORAL_TABLET | Freq: Every day | ORAL | Status: DC
  Administered 2021-10-22: 40 mg via ORAL
  Filled 2021-10-22: qty 2

## 2021-10-22 MED ORDER — HYDROCHLOROTHIAZIDE 25 MG PO TABS: 25.0000 mg | ORAL_TABLET | Freq: Every day | ORAL | 2 refills | Status: DC

## 2021-10-22 MED ORDER — ROSUVASTATIN CALCIUM 40 MG PO TABS: 40.0000 mg | ORAL_TABLET | Freq: Every day | ORAL | 2 refills | Status: DC

## 2021-10-22 MED ORDER — CLOPIDOGREL BISULFATE 75 MG PO TABS: 75.0000 mg | ORAL_TABLET | Freq: Every day | ORAL | 0 refills | Status: DC

## 2021-10-22 MED ORDER — ASPIRIN EC 81 MG PO TBEC
81.0000 mg | DELAYED_RELEASE_TABLET | Freq: Every day | ORAL | 0 refills | Status: AC
Start: 2021-10-22 — End: 2022-01-20

## 2021-10-22 SURGICAL SUPPLY — 2 items
PACK LOOP INSERTION (CUSTOM PROCEDURE TRAY) ×2 IMPLANT
SYSTEM MONITOR REVEAL LINQ II (Prosthesis & Implant Heart) ×1 IMPLANT

## 2021-10-22 NOTE — Progress Notes (Signed)
Discharge instructions reviewed with pt. ?Copy of instructions given to pt, informed that his prescriptions were sent to his pharmacy. Handouts on stroke education were reviewed and given to pt.  Care of loop recorder reviewed with pt, and handout provided in d/c instructions.  ?Pt's ride on their way to pick up pt, per pt states.  ?

## 2021-10-22 NOTE — Discharge Summary (Signed)
?Physician Discharge Summary  ?Jerry [name] [id number] DOB: 21-Feb-1956 DOA: 10/20/2021 ? ?PCP: Pieter Partridge, PA ? ?Admit date: 10/20/2021 ?Discharge date: 10/22/2021 ? ?Admitted From: Home ?Disposition: Home ? ?Recommendations for Outpatient Follow-up:  ?Follow up with PCP in 1-2 weeks ?Follow-up with neurology outpatient in 4 weeks, stroke follow-up ?Continue DAPT with aspirin/Plavix for 3 weeks followed by aspirin alone (patient reports he was not taking aspirin at home previous to hospitalization) ?Crestor increased to 40 mg p.o. daily ?Outpatient follow-up with cardiology for loop recorder results ?Recommend BMP 1 week to assess sodium level, if persistently low (133 on discharge) consider discontinuation of HCTZ ? ? ?Home Health: No needs identified by PT/OT/SLP ?Equipment/Devices: None ? ?Discharge Condition: Stable ?CODE STATUS: Full code ?Diet recommendation: Heart healthy diet/consistent carb regular diet ? ?History of present illness: ? ?Jerry [name] is a 66 year old male with past medical history significant for essential hypertension, type 2 diabetes mellitus, hyperlipidemia, knee osteoarthritis s/p bilateral knee replacement, obesity who presented to [facility] ED on 4/15 with worsening left lower extremity weakness, ataxia, right-sided headache, somnolence and mild confusion.  No reported slurred speech or facial droop, no dysphagia.  He was given aspirin by his wife for headache. ? ?In the ED, CT head revealed no acute abnormality but showed subacute to chronic cortical infarct within the posterior right parietal lobe, chronic microvascular disease.  MR brain with acute/subacute nonhemorrhagic infarct involving the right caudate head, right middle lobe, right frontal lobe.  Neurology was consulted.  Patient was found to be outside the tPA window.  Hospital service consulted for further evaluation management of acute/subacute CVA. ? ?Hospital course: ? ?Acute vs subacute CVA, concerning for embolic  source ?Patient presenting to ED with headache, confusion, left lower extremity weakness with ataxia.  MRI brain with acute versus subacute nonhemorrhagic infarct involving the right caudate head, right middle lobe, right frontal lobe.  Neurology was consulted, deemed out of the tPA window.  Hemoglobin C 6.2.  LDL 88.  CTA head/neck with no large vessel occlusion.  TTE with LVEF [id number], grade 1 diastolic dysfunction, no regional wall motion normalities, IVC normal, no intra-atrial shunt.  Patient was monitored on telemetry during hospitalization with no arrhythmias identified.  TEE not recommended by neurology.  Bilateral duplex ultrasound lower extremities negative for DVT.  Given concern for embolic source, loop recorder was placed by cardiology.  Continue dual antiplatelet therapy with aspirin and Plavix x3 weeks followed by aspirin alone.  Crestor was increased to 40 mg p.o. daily.  Patient was seen by PT/OT/SLP with no needs identified.  Patient's symptoms have resolved and are at baseline at time of discharge.  Outpatient follow-up with neurology 4 weeks, stroke clinic. ? ?Type 2 diabetes mellitus ?Hemoglobin A1c 6.2, well controlled.  Continue Lantus 15 incipiently twice daily.  Metformin 1000 mg p.o. twice daily.  Outpatient follow-up PCP. ? ?Essential hypertension ?Amlodipine 10 mg p.o. daily, hydrochlorothiazide 25 mg p.o. daily.  Recommend repeat BMP 1 week, if persistent hyponatremia, recommend discontinuation of HCTZ. ? ?Hyponatremia ?Patient with mild hyponatremia, sodium 133 at time of discharge.  Patient on HCTZ outpatient for HTN.  Recommend repeat BMP 1 week, if persistent hyponatremia may consider discontinuation of HCTZ. ? ?Hyperlipidemia ?Lipid profile with total cholesterol 163, HDL 58, LDL 88, triglycerides 84.  Crestor was increased to 40 mg p.o. daily.  Outpatient follow-up with PCP. ? ?Morbid obesity ?Body mass index is 32.26 kg/m?Marland Kitchen  Discussed with patient needs for aggressive lifestyle  changes/weight loss as this  complicates all facets of care.  Outpatient follow-up with PCP.  May benefit from bariatric evaluation outpatient. ? ? ? ? ? ?Discharge Diagnoses:  ?Principal Problem: ?  Acute CVA (cerebrovascular accident) ([place]) ? ? ? ?Discharge Instructions ? ?Discharge Instructions   ? ? Ambulatory referral to Neurology   Complete by: As directed ?  ? An appointment is requested in approximately: 4 weeks  ? Call MD for:  difficulty breathing, headache or visual disturbances   Complete by: As directed ?  ? Call MD for:  extreme fatigue   Complete by: As directed ?  ? Call MD for:  persistant dizziness or light-headedness   Complete by: As directed ?  ? Call MD for:  persistant nausea and vomiting   Complete by: As directed ?  ? Call MD for:  severe uncontrolled pain   Complete by: As directed ?  ? Call MD for:  temperature >100.4   Complete by: As directed ?  ? Diet - low sodium heart healthy   Complete by: As directed ?  ? Increase activity slowly   Complete by: As directed ?  ? ?  ? ?Allergies as of 10/22/2021   ? ?   Reactions  ? Diclofenac Sodium Other (See Comments)  ? Gel caused a burning sensation   ? Lisinopril Cough  ? Losartan Cough  ? Unknown bad side effects ?Unknown bad side effects  ? Voltaren [diclofenac] Other (See Comments)  ? Gel caused a burning sensation   ? ?  ? ?  ?Medication List  ?  ? ?STOP taking these medications   ? ?amoxicillin-clavulanate 875-125 MG tablet ?Commonly known as: AUGMENTIN ?  ?carvedilol 25 MG tablet ?Commonly known as: COREG ?  ?gabapentin 100 MG capsule ?Commonly known as: NEURONTIN ?  ?HYDROmorphone 2 MG tablet ?Commonly known as: Dilaudid ?  ?methocarbamol 500 MG tablet ?Commonly known as: ROBAXIN ?  ?pregabalin 50 MG capsule ?Commonly known as: LYRICA ?  ?rivaroxaban 10 MG Tabs tablet ?Commonly known as: XARELTO ?  ?traMADol 50 MG tablet ?Commonly known as: ULTRAM ?  ?Tyler Aas FlexTouch 100 UNIT/ML FlexTouch Pen ?Generic drug: insulin degludec ?  ? ?   ? ?TAKE these medications   ? ?acetaminophen 500 MG tablet ?Commonly known as: TYLENOL ?Take 1,000 mg by mouth every 6 (six) hours as needed for mild pain. ?  ?amLODipine 10 MG tablet ?Commonly known as: NORVASC ?Take 1 tablet (10 mg total) by mouth daily. ?What changed: Another medication with the same name was removed. Continue taking this medication, and follow the directions you see here. ?  ?aspirin EC 81 MG tablet ?Take 1 tablet (81 mg total) by mouth daily. Swallow whole. ?What changed: when to take this ?  ?clopidogrel 75 MG tablet ?Commonly known as: PLAVIX ?Take 1 tablet (75 mg total) by mouth daily for 21 days. ?Start taking on: October 23, 2021 ?  ?docusate sodium 100 MG capsule ?Commonly known as: COLACE ?Take 1 capsule (100 mg total) by mouth every 12 (twelve) hours. ?What changed:  ?when to take this ?reasons to take this ?  ?GlucoCom Blood Glucose Monitor Devi ?Please check blood sugar 5 times per day due to fluctuating blood sugars. ?  ?glucose blood test strip ?Please check blood sugar 5 times per day due to fluctuating blood sugars. ?  ?hydrochlorothiazide 25 MG tablet ?Commonly known as: HYDRODIURIL ?Take 1 tablet (25 mg total) by mouth daily. ?  ?insulin glargine 100 UNIT/ML Solostar Pen ?Commonly known as: Lantus SoloStar ?  Inject 15 Units into the skin 2 (two) times daily. ?What changed: when to take this ?  ?metFORMIN 1000 MG tablet ?Commonly known as: GLUCOPHAGE ?Take 1 tablet (1,000 mg total) by mouth daily. ?What changed: when to take this ?  ?rosuvastatin 40 MG tablet ?Commonly known as: CRESTOR ?Take 1 tablet (40 mg total) by mouth daily. ?Start taking on: October 23, 2021 ?What changed:  ?medication strength ?how much to take ?when to take this ?  ?sertraline 50 MG tablet ?Commonly known as: ZOLOFT ?Take 50 mg by mouth daily. ?  ? ?  ? ? Follow-up Information   ? ? Pieter Partridge, PA. Schedule an appointment as soon as possible for a visit in 1 week(s).   ?Specialty: Family  Medicine ?Contact information: ?[address] [place] ?[place] [address] ?[phone] ? ? ?  ?  ? ? [facility]. Schedule an appointment as soon as possible for a visit in 4 week(s).   ?Specialty: Lianne Cure

## 2021-10-22 NOTE — Progress Notes (Addendum)
STROKE TEAM PROGRESS NOTE  ? ?INTERVAL HISTORY ?Jerry [name] is a 66 y.o. male with a history of diabetes, hypertension, dyslipidemia, who presents with leg weakness that began on 4/12. MRI brain showed infarcts in the R caudate, parietal lobe, and frontal lobe. CTA was unremarkable.  ? ?The patient is seen in his room with no family at bedside. He reports that he went to the auto parts store on 4/12 and suddenly developed R hand numbness followed by R leg weakness that caused him to stumble. This quickly resolved and he did not make much of the episode. He had a headache 2 days later and then went in for evaluation. He reports that he has no symptoms currently.  ? ?Vitals:  ? 10/21/21 1526 10/21/21 1956 10/21/21 2331 10/22/21 0346  ?BP: (!) 171/96 (!) 158/118 (!) 132/104 (!) 163/99  ?Pulse: 85 92 92 83  ?Resp:  20  15  ?Temp: 97.8 ?F (36.6 ?C) 98.4 ?F (36.9 ?C) 98.3 ?F (36.8 ?C) 98.7 ?F (37.1 ?C)  ?TempSrc: Oral Oral Oral Oral  ?SpO2: 99% 99% 98% 99%  ?Weight:      ?Height:      ? ?CBC:  ?Recent Labs  ?Lab 10/20/21 ?1526 10/20/21 ?1722 10/21/21 ?0405  ?WBC  --   --  7.8  ?NEUTROABS 3.6  --   --   ?HGB  --  15.3 16.0  ?HCT  --  45.0 43.2  ?MCV  --   --  92.3  ?PLT  --   --  257  ? ?Basic Metabolic Panel:  ?Recent Labs  ?Lab 10/21/21 ?0405 10/22/21 ?0136  ?NA 131* 132*  ?K 4.0 3.5  ?CL 97* 100  ?CO2 23 23  ?GLUCOSE 265* 216*  ?BUN 23 19  ?CREATININE 1.15 1.22  ?CALCIUM 9.7 9.5  ?MG 1.6*  --   ?PHOS 2.3*  --   ? ?Lipid Panel:  ?Recent Labs  ?Lab 10/21/21 ?0406  ?CHOL 163  ?TRIG 84  ?HDL 58  ?CHOLHDL 2.8  ?VLDL 17  ?[place] 88  ? ?HgbA1c:  ?Recent Labs  ?Lab 10/21/21 ?0406  ?HGBA1C 6.2*  ? ?Urine Drug Screen: No results for input(s): LABOPIA, COCAINSCRNUR, LABBENZ, AMPHETMU, THCU, LABBARB in the last 168 hours.  ?Alcohol Level  ?Recent Labs  ?Lab 10/20/21 ?1714  ?ETH <10  ? ? ?IMAGING past 24 hours ?ECHOCARDIOGRAM COMPLETE BUBBLE STUDY ? ?Result Date: 10/21/2021 ?   ECHOCARDIOGRAM REPORT   Patient Name:   Jerry [name] Date of Exam: 10/21/2021 Medical Rec #:  [id number]       Height:       76.0 in Accession #:    [id number]      Weight:       265.0 lb Date of Birth:  27-Jun-1956       BSA:          2.497 m? Patient Age:    54 years        BP:           197/119 mmHg Patient Gender: M               HR:           88 bpm. Exam Location:  Inpatient Procedure: 2D Echo and Saline Contrast Bubble Study Indications:    Stroke  History:        Patient has no prior history of Echocardiogram examinations.  Risk Factors:Diabetes and Hypertension.  Sonographer:    Jefferey Pica Referring Phys: [id number] [place]  1. Left ventricular ejection fraction, by estimation, is 70 to 75%. The left ventricle has hyperdynamic function. The left ventricle has no regional wall motion abnormalities. There is mild left ventricular hypertrophy. Left ventricular diastolic parameters are consistent with Grade I diastolic dysfunction (impaired relaxation).  2. Right ventricular systolic function is normal. The right ventricular size is normal. Tricuspid regurgitation signal is inadequate for assessing PA pressure.  3. The mitral valve is normal in structure. No evidence of mitral valve regurgitation. No evidence of mitral stenosis.  4. The aortic valve is tricuspid. Aortic valve regurgitation is not visualized. No aortic stenosis is present.  5. Aortic dilatation noted. There is mild dilatation of the ascending aorta, measuring 38 mm.  6. The inferior vena cava is normal in size with greater than 50% respiratory variability, suggesting right atrial pressure of 3 mmHg.  7. Agitated saline contrast bubble study was negative, with no evidence of any interatrial shunt. FINDINGS  Left Ventricle: Left ventricular ejection fraction, by estimation, is 70 to 75%. The left ventricle has hyperdynamic function. The left ventricle has no regional wall motion abnormalities. The left ventricular internal cavity size was normal in size. There  is mild left ventricular hypertrophy. Left ventricular diastolic parameters are consistent with Grade I diastolic dysfunction (impaired relaxation). Normal left ventricular filling pressure. Right Ventricle: The right ventricular size is normal. No increase in right ventricular wall thickness. Right ventricular systolic function is normal. Tricuspid regurgitation signal is inadequate for assessing PA pressure. Left Atrium: Left atrial size was normal in size. Right Atrium: Right atrial size was normal in size. Pericardium: There is no evidence of pericardial effusion. Mitral Valve: The mitral valve is normal in structure. No evidence of mitral valve regurgitation. No evidence of mitral valve stenosis. Tricuspid Valve: The tricuspid valve is normal in structure. Tricuspid valve regurgitation is not demonstrated. No evidence of tricuspid stenosis. Aortic Valve: The aortic valve is tricuspid. Aortic valve regurgitation is not visualized. No aortic stenosis is present. Aortic valve mean gradient measures 3.0 mmHg. Aortic valve peak gradient measures 5.2 mmHg. Aortic valve area, by VTI measures 3.34 cm?. Pulmonic Valve: The pulmonic valve was not well visualized. Pulmonic valve regurgitation is not visualized. No evidence of pulmonic stenosis. Aorta: The aortic root is normal in size and structure and aortic dilatation noted. There is mild dilatation of the ascending aorta, measuring 38 mm. Venous: The inferior vena cava is normal in size with greater than 50% respiratory variability, suggesting right atrial pressure of 3 mmHg. IAS/Shunts: No atrial level shunt detected by color flow Doppler. Agitated saline contrast was given intravenously to evaluate for intracardiac shunting. Agitated saline contrast bubble study was negative, with no evidence of any interatrial shunt.  LEFT VENTRICLE PLAX 2D LVIDd:         4.15 cm   Diastology LVIDs:         2.80 cm   LV e' medial:    4.82 cm/s LV PW:         1.25 cm   LV E/e'  medial:  10.8 LV IVS:        1.25 cm   LV e' lateral:   5.19 cm/s LVOT diam:     2.30 cm   LV E/e' lateral: 10.0 LV SV:         70 LV SV Index:   28 LVOT Area:  4.15 cm?  RIGHT VENTRICLE             IVC RV Basal diam:  2.50 cm     IVC diam: 1.40 cm RV S prime:     10.70 cm/s TAPSE (M-mode): 1.7 cm LEFT ATRIUM             Index        RIGHT ATRIUM           Index LA diam:        3.50 cm 1.40 cm/m?   RA Area:     11.70 cm? LA Vol (A2C):   28.6 ml 11.46 ml/m?  RA Volume:   25.90 ml  10.37 ml/m? LA Vol (A4C):   29.4 ml 11.78 ml/m? LA Biplane Vol: 29.3 ml 11.74 ml/m?  AORTIC VALVE                    PULMONIC VALVE AV Area (Vmax):    3.78 cm?     PV Vmax:       0.87 m/s AV Area (Vmean):   3.39 cm?     PV Peak grad:  3.0 mmHg AV Area (VTI):     3.34 cm? AV Vmax:           114.16 cm/s AV Vmean:          81.910 cm/s AV VTI:            0.210 m AV Peak Grad:      5.2 mmHg AV Mean Grad:      3.0 mmHg LVOT Vmax:         104.00 cm/s LVOT Vmean:        66.800 cm/s LVOT VTI:          0.169 m LVOT/AV VTI ratio: 0.80  AORTA Ao Root diam: 3.60 cm Ao Asc diam:  3.80 cm MITRAL VALVE MV Area (PHT): 3.83 cm?    SHUNTS MV Decel Time: 198 msec    Systemic VTI:  0.17 m MV E velocity: 52.10 cm/s  Systemic Diam: 2.30 cm MV A velocity: 82.90 cm/s MV E/A ratio:  0.63 Carlyle Dolly MD Electronically signed by Carlyle Dolly MD Signature Date/Time: 10/21/2021/12:47:23 PM    Final    ? ?PHYSICAL EXAM ? ?Physical Exam  ?Constitutional: Appears well-developed and well-nourished.  ?Cardiovascular: Normal rate and regular rhythm.  ?Respiratory: Effort normal, non-labored breathing ? ?Neuro: ?Mental Status: ?Patient is awake, alert, oriented to person, place, month, year, and situation. ?Patient is able to give a clear and coherent history. ?No signs of aphasia or neglect ?Cranial Nerves: ?II: Visual Fields are full. Pupils are equal, round, and reactive to light.   ?III,IV, VI: EOMI without ptosis or diploplia.  ?V: Facial sensation is symmetric  to temperature ?VII: Facial movement is symmetric resting and smiling ?VIII: Hearing is intact to voice ?X: Palate elevates symmetrically ?XI: Shoulder shrug is symmetric. ?XII: Tongue protrudes midline withou

## 2021-10-22 NOTE — Progress Notes (Signed)
VASCULAR LAB ? ? ? ?Bilateral lower extremity venous duplex has been performed. ? ?See CV proc for preliminary results. ? ? ?Jerry [name], RVT ?10/22/2021, 1:02 PM ? ?

## 2021-10-22 NOTE — Consult Note (Signed)
? ? ?ELECTROPHYSIOLOGY CONSULT NOTE  ?Patient ID: Jerry [name] ?MRN: [id number], DOB/AGE: 66-Dec-1957  ? ?Admit date: 10/20/2021 ?Date of Consult: 10/22/2021 ? ?Primary Physician: Roderick Pee, PA ?Primary Cardiologist: None  ?Primary Electrophysiologist: New to Dr. Graciela Husbands ?Reason for Consultation: Cryptogenic stroke; recommendations regarding Implantable Loop Recorder ?Insurance: [employer] Medicare ? ?History of Present Illness ?EP has been asked to evaluate Jerry [name] for placement of an implantable loop recorder to monitor for atrial fibrillation by Dr Roda Shutters.  The patient was admitted on 10/20/2021 with 4 days of LLE weakness.   ? ?Imaging demonstrated right parietal lobe infarct.   ? 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 ?CTA head & neck moderate atheromatous change in the right carotid siphon ?MRI  embolic appearing strokes in the right hemisphere.  ?2D Echo LVEF is 70-75%, grade 1 diastolic dysfunction ?LDL 88 ?HgbA1c 6.2  ?No antithrombotic prior to admission, now on aspirin 81 mg daily and clopidogrel 75 mg daily.  ?Therapy recommendations:  No PT/OT follow up ?Disposition:  Home ? ? ?The patient has been monitored on telemetry which has demonstrated sinus rhythm with no arrhythmias.  Inpatient stroke work-up will not require a TEE per Neurology.  ? ?Echocardiogram as above. Lab work is reviewed. ? ?Prior to admission, the patient denies chest pain, shortness of breath, dizziness, palpitations, or syncope.  He is recovering from his stroke with plans to return home  at discharge. ? ?Past Medical History:  ?Diagnosis Date  ? Arthritis   ? Diabetes mellitus 2008  ? type II  ? Dyslipidemia   ? Erectile dysfunction   ? History of cardiac catheterization 06/2013  ? Dr. Peter Swaziland  ? HTN (hypertension)   ? Hypogonadism male   ?   ? ?Surgical History:  ?Past Surgical History:  ?Procedure Laterality Date  ? APPENDECTOMY    ? COLONOSCOPY  08/2006  ? Dr. Russella Dar - repeat 2018  ? INCISION AND DRAINAGE PERIRECTAL ABSCESS    ? KNEE ARTHROSCOPY Left 12/28/2014  ? Procedure: ARTHROSCOPY LEFT KNEE WITH  MEDIAL MENSICUS DEBRIDEMENT;  Surgeon: Ollen Gross, MD;  Location: [facility];  Service: Orthopedics;  Laterality: Left;  ? KNEE SURGERY    ? left knee - remote past  ? LEFT HEART CATHETERIZATION WITH CORONARY ANGIOGRAM N/A 06/11/2013  ? Procedure: LEFT HEART CATHETERIZATION WITH CORONARY ANGIOGRAM;  Surgeon: Peter M Swaziland, MD;  Location: [facility] CATH LAB;  Service: Cardiovascular;  Laterality: N/A;  ? right arm and elbow surgery    ? TONSILLECTOMY    ? TOTAL KNEE ARTHROPLASTY Left 10/04/2015  ? Procedure: LEFT TOTAL KNEE ARTHROPLASTY right knee aspiration and injection;  Surgeon: Ollen Gross, MD;  Location: [facility];  Service: Orthopedics;  Laterality: Left;  ? TOTAL KNEE ARTHROPLASTY Right 11/04/2016  ? Procedure: RIGHT TOTAL KNEE ARTHROPLASTY;  Surgeon: Ollen Gross, MD;  Location: [facility];  Service: Orthopedics;  Laterality: Right;  with abductor block  ? wisdom teeth extractions    ?  ? ?Medications Prior to Admission  ?Medication Sig Dispense Refill Last Dose  ? acetaminophen (TYLENOL) 500 MG tablet Take 1,000 mg by mouth every 6 (six) hours as needed for mild pain.   unknown  ? amLODipine (NORVASC) 10 MG tablet Take 10 mg by mouth daily.   10/20/2021  ? aspirin EC 81 MG tablet Take 81 mg by mouth  once. Swallow whole.   10/20/2021  ? docusate sodium (COLACE) 100 MG capsule Take 1 capsule (100 mg total) by mouth every 12 (twelve) hours. (Patient taking differently: Take 100 mg by mouth 2 (two) times daily as needed for moderate constipation.) 30 capsule 0 unknown  ? hydrochlorothiazide (HYDRODIURIL) 25 MG tablet Take 25 mg by mouth daily.   10/20/2021  ? Insulin Glargine (LANTUS SOLOSTAR) 100 UNIT/ML Solostar Pen Inject 15 Units into the skin 2 (two) times  daily. (Patient taking differently: Inject 15 Units into the skin at bedtime.) 15 mL 3 10/19/2021  ? metFORMIN (GLUCOPHAGE) 1000 MG tablet Take 1 tablet (1,000 mg total) by mouth daily. (Patient taking differently: Take 1,000 mg by mouth 2 (two) times daily with a meal.) 180 tablet 0 10/20/2021  ? rosuvastatin (CRESTOR) 20 MG tablet Take 20 mg by mouth at bedtime.   10/19/2021  ? sertraline (ZOLOFT) 50 MG tablet Take 50 mg by mouth daily.   10/20/2021  ? TRESIBA FLEXTOUCH 100 UNIT/ML FlexTouch Pen Inject 30 Units into the skin every morning.   10/20/2021  ? amLODipine (NORVASC) 5 MG tablet TAKE ONE TABLET BY MOUTH ONCE DAILY (Patient not taking: Reported on 10/20/2021) 180 tablet 0 Not Taking  ? amoxicillin-clavulanate (AUGMENTIN) 875-125 MG tablet Take 1 tablet by mouth every 12 (twelve) hours. (Patient not taking: Reported on 10/20/2021) 14 tablet 0 Completed Course  ? Blood Glucose Monitoring Suppl (GLUCOCOM BLOOD GLUCOSE MONITOR) DEVI Please check blood sugar 5 times per day due to fluctuating blood sugars.     ? carvedilol (COREG) 25 MG tablet TAKE ONE TABLET BY MOUTH TWICE DAILY (Patient not taking: Reported on 10/20/2021) 180 tablet 0 Completed Course  ? gabapentin (NEURONTIN) 100 MG capsule Take 1 capsule (100 mg total) by mouth 3 (three) times daily. (Patient not taking: Reported on 10/20/2021) 90 capsule 1 Completed Course  ? glucose blood test strip Please check blood sugar 5 times per day due to fluctuating blood sugars.     ? HYDROmorphone (DILAUDID) 2 MG tablet Take 1-3 tablets (2-6 mg total) by mouth every 4 (four) hours as needed for severe pain. (Patient not taking: Reported on 10/20/2021) 120 tablet 0 Completed Course  ? methocarbamol (ROBAXIN) 500 MG tablet Take 1 tablet (500 mg total) by mouth every 6 (six) hours as needed for muscle spasms. (Patient not taking: Reported on 10/20/2021) 90 tablet 0 Completed Course  ? pregabalin (LYRICA) 50 MG capsule Take 1 capsule (50 mg total) by mouth 2 (two) times  daily. (Patient not taking: Reported on 10/20/2021) 60 capsule 0 Completed Course  ? rivaroxaban (XARELTO) 10 MG TABS tablet Take 1 tablet (10 mg total) by mouth daily with breakfast. Take Xarelto for two and a half more weeks following discharge from the hospital, then discontinue Xarelto. ?Once the patient has completed the blood thinner regimen, then take a Baby 81 mg Aspirin daily for three more weeks. (Patient not taking: Reported on 10/20/2021) 19 tablet 0 Completed Course  ? traMADol (ULTRAM) 50 MG tablet Take 1-2 tablets (50-100 mg total) by mouth every 6 (six) hours as needed for moderate pain. (Patient not taking: Reported on 10/20/2021) 56 tablet 0 Completed Course  ? ? ?Inpatient Medications:  ? aspirin EC  81 mg Oral Daily  ? clopidogrel  75 mg Oral Daily  ? enoxaparin (LOVENOX) injection  40 mg Subcutaneous Q24H  ? insulin aspart  0-15 Units Subcutaneous TID WC  ? insulin aspart  0-5 Units Subcutaneous QHS  ? insulin  aspart  3 Units Subcutaneous TID WC  ? insulin glargine-yfgn  5 Units Subcutaneous QHS  ? nicotine  21 mg Transdermal Daily  ? rosuvastatin  40 mg Oral Daily  ? sertraline  50 mg Oral Daily  ? ? ?Allergies:  ?Allergies  ?Allergen Reactions  ? Diclofenac Sodium Other (See Comments)  ?  Gel caused a burning sensation   ? Lisinopril Cough  ? Losartan Cough  ?  Unknown bad side effects ?Unknown bad side effects  ? Voltaren [Diclofenac] Other (See Comments)  ?  Gel caused a burning sensation   ? ? ?Social History  ? ?Socioeconomic History  ? Marital status: Married  ?  Spouse name: Not on file  ? Number of children: Not on file  ? Years of education: Not on file  ? Highest education level: Not on file  ?Occupational History  ? Occupation: retired  ?  Comment: prior physical therapist  ?Tobacco Use  ? Smoking status: Former  ?  Types: Cigarettes  ?  Quit date: 11/11/2001  ?  Years since quitting: 19.9  ? Smokeless tobacco: Never  ?Substance and Sexual Activity  ? Alcohol use: No  ? Drug use: No  ?  Sexual activity: Never  ?Other Topics Concern  ? Not on file  ?Social History Narrative  ? Married 47 years, 2 daughter and 1 son. Retired, used to work as a PT.  Exercises treadmill daily, some weight bearing exercise. Sharma Covert

## 2021-10-22 NOTE — Discharge Instructions (Signed)

## 2021-10-22 NOTE — TOC Transition Note (Signed)
Transition of Care (TOC) - CM/SW Discharge Note ? ? ?Patient Details  ?Name: Jerry [name] ?MRN: [id number] ?Date of Birth: [birth year] ? ?Transition of Care (TOC) CM/SW Contact:  ?Kermit Balo, RN ?Phone Number: ?10/22/2021, 3:53 PM ? ? ?Clinical Narrative:    ?Patient is discharging home with self care. No f/u per PT/OT and no DME needs.  ?Pt is from home with spouse.  ?Pt has transportation home.  ? ? ?Final next level of care: Home/Self Care ?Barriers to Discharge: No Barriers Identified ? ? ?Patient Goals and CMS Choice ?  ?  ?  ? ?Discharge Placement ?  ?           ?  ?  ?  ?  ? ?Discharge Plan and Services ?  ?  ?           ?  ?  ?  ?  ?  ?  ?  ?  ?  ?  ? ?Social Determinants of Health (SDOH) Interventions ?  ? ? ?Readmission Risk Interventions ?   ? View : No data to display.  ?  ?  ?  ? ? ? ? ? ?

## 2021-10-25 ENCOUNTER — Encounter (HOSPITAL_COMMUNITY): Payer: Self-pay | Admitting: Internal Medicine

## 2021-10-31 ENCOUNTER — Emergency Department (HOSPITAL_COMMUNITY): Payer: Medicare HMO

## 2021-10-31 ENCOUNTER — Inpatient Hospital Stay (HOSPITAL_COMMUNITY)
Admission: EM | Admit: 2021-10-31 | Discharge: 2021-11-02 | DRG: 065 | Disposition: A | Discharge status: Home / Self Care | Payer: Medicare HMO | Attending: Internal Medicine | Admitting: Internal Medicine

## 2021-10-31 ENCOUNTER — Other Ambulatory Visit: Payer: Self-pay

## 2021-10-31 ENCOUNTER — Encounter (HOSPITAL_COMMUNITY): Payer: Self-pay | Admitting: Emergency Medicine

## 2021-10-31 DIAGNOSIS — Z96653 Presence of artificial knee joint, bilateral: Secondary | ICD-10-CM | POA: Diagnosis present

## 2021-10-31 DIAGNOSIS — I639 Cerebral infarction, unspecified: Secondary | ICD-10-CM | POA: Diagnosis present

## 2021-10-31 DIAGNOSIS — E119 Type 2 diabetes mellitus without complications: Secondary | ICD-10-CM

## 2021-10-31 DIAGNOSIS — Z7982 Long term (current) use of aspirin: Secondary | ICD-10-CM

## 2021-10-31 DIAGNOSIS — E861 Hypovolemia: Secondary | ICD-10-CM | POA: Diagnosis present

## 2021-10-31 DIAGNOSIS — Z79899 Other long term (current) drug therapy: Secondary | ICD-10-CM

## 2021-10-31 DIAGNOSIS — F1210 Cannabis abuse, uncomplicated: Secondary | ICD-10-CM | POA: Diagnosis present

## 2021-10-31 DIAGNOSIS — R531 Weakness: Secondary | ICD-10-CM | POA: Diagnosis not present

## 2021-10-31 DIAGNOSIS — Z7984 Long term (current) use of oral hypoglycemic drugs: Secondary | ICD-10-CM

## 2021-10-31 DIAGNOSIS — E785 Hyperlipidemia, unspecified: Secondary | ICD-10-CM | POA: Diagnosis present

## 2021-10-31 DIAGNOSIS — Z6832 Body mass index (BMI) 32.0-32.9, adult: Secondary | ICD-10-CM

## 2021-10-31 DIAGNOSIS — N179 Acute kidney failure, unspecified: Secondary | ICD-10-CM | POA: Diagnosis present

## 2021-10-31 DIAGNOSIS — E871 Hypo-osmolality and hyponatremia: Secondary | ICD-10-CM | POA: Diagnosis present

## 2021-10-31 DIAGNOSIS — E876 Hypokalemia: Secondary | ICD-10-CM | POA: Diagnosis present

## 2021-10-31 DIAGNOSIS — Z833 Family history of diabetes mellitus: Secondary | ICD-10-CM

## 2021-10-31 DIAGNOSIS — Z794 Long term (current) use of insulin: Secondary | ICD-10-CM

## 2021-10-31 DIAGNOSIS — Z7902 Long term (current) use of antithrombotics/antiplatelets: Secondary | ICD-10-CM

## 2021-10-31 DIAGNOSIS — I63511 Cerebral infarction due to unspecified occlusion or stenosis of right middle cerebral artery: Principal | ICD-10-CM | POA: Diagnosis present

## 2021-10-31 DIAGNOSIS — E669 Obesity, unspecified: Secondary | ICD-10-CM | POA: Diagnosis present

## 2021-10-31 DIAGNOSIS — I10 Essential (primary) hypertension: Secondary | ICD-10-CM | POA: Diagnosis present

## 2021-10-31 DIAGNOSIS — Z8673 Personal history of transient ischemic attack (TIA), and cerebral infarction without residual deficits: Secondary | ICD-10-CM | POA: Diagnosis present

## 2021-10-31 DIAGNOSIS — Z87891 Personal history of nicotine dependence: Secondary | ICD-10-CM

## 2021-10-31 LAB — PROTIME-INR
INR: 1.1 (ref 0.8–1.2)
Prothrombin Time: 13.6 seconds (ref 11.4–15.2)

## 2021-10-31 LAB — ETHANOL: Alcohol, Ethyl (B): <10 mg/dL (ref <10)

## 2021-10-31 LAB — COMPREHENSIVE METABOLIC PANEL
ALT: 20 U/L (ref 0–44)
AST: 24 U/L (ref 15–41)
Albumin: 4 g/dL (ref 3.5–5.0)
Alkaline Phosphatase: 45 U/L (ref 38–126)
Anion gap: 10 (ref 5–15)
BUN: 20 mg/dL (ref 8–23)
CO2: 23 mmol/L (ref 22–32)
Calcium: 9.5 mg/dL (ref 8.9–10.3)
Chloride: 96 mmol/L — ABNORMAL LOW (ref 98–111)
Creatinine, Ser: 1.52 mg/dL — ABNORMAL HIGH (ref 0.61–1.24)
GFR, Estimated: 50 mL/min — ABNORMAL LOW (ref >60)
Glucose, Bld: 172 mg/dL — ABNORMAL HIGH (ref 70–99)
Potassium: 3.5 mmol/L (ref 3.5–5.1)
Sodium: 129 mmol/L — ABNORMAL LOW (ref 135–145)
Total Bilirubin: 1.9 mg/dL — ABNORMAL HIGH (ref 0.3–1.2)
Total Protein: 7.1 g/dL (ref 6.5–8.1)

## 2021-10-31 LAB — DIFFERENTIAL
Abs Immature Granulocytes: 0.01 10*3/uL (ref 0.00–0.07)
Basophils Absolute: 0 10*3/uL (ref 0.0–0.1)
Basophils Relative: 0 %
Eosinophils Absolute: 0.1 10*3/uL (ref 0.0–0.5)
Eosinophils Relative: 1 %
Immature Granulocytes: 0 %
Lymphocytes Relative: 34 %
Lymphs Abs: 2.5 10*3/uL (ref 0.7–4.0)
Monocytes Absolute: 0.7 10*3/uL (ref 0.1–1.0)
Monocytes Relative: 9 %
Neutro Abs: 4.1 10*3/uL (ref 1.7–7.7)
Neutrophils Relative %: 56 %

## 2021-10-31 LAB — CBC
HCT: 38.5 % — ABNORMAL LOW (ref 39.0–52.0)
Hemoglobin: 14.2 g/dL (ref 13.0–17.0)
MCH: 34.5 pg — ABNORMAL HIGH (ref 26.0–34.0)
MCHC: 36.9 g/dL — ABNORMAL HIGH (ref 30.0–36.0)
MCV: 93.4 fL (ref 80.0–100.0)
Platelets: 262 10*3/uL (ref 150–400)
RBC: 4.12 MIL/uL — ABNORMAL LOW (ref 4.22–5.81)
RDW: 11.4 % — ABNORMAL LOW (ref 11.5–15.5)
WBC: 7.4 10*3/uL (ref 4.0–10.5)
nRBC: 0 % (ref 0.0–0.2)

## 2021-10-31 LAB — APTT: aPTT: 36 seconds (ref 24–36)

## 2021-10-31 MED ORDER — SODIUM CHLORIDE 0.9 % IV SOLN
100.0000 mL/h | INTRAVENOUS | Status: DC
  Administered 2021-10-31: 100 mL/h via INTRAVENOUS

## 2021-10-31 MED ORDER — ACETAMINOPHEN 325 MG PO TABS
650.0000 mg | ORAL_TABLET | Freq: Once | ORAL | Status: AC
  Administered 2021-10-31: 650 mg via ORAL
  Filled 2021-10-31: qty 2

## 2021-10-31 MED ORDER — SODIUM CHLORIDE 0.9 % IV BOLUS
500.0000 mL | Freq: Once | INTRAVENOUS | Status: AC
  Administered 2021-10-31: 500 mL via INTRAVENOUS

## 2021-10-31 NOTE — ED Provider Notes (Signed)
?[facility] EMERGENCY DEPARTMENT ?Provider Note ? ? ?CSN: [id number] ?Arrival date & time: 10/31/21  2041 ? ?  ? ?History ? ?Chief Complaint  ?Patient presents with  ? Headache  ?  Numbness/Tingling left hand  ? ? ?Jerry [name] is a 66 y.o. male. ? ? ?Headache ?Associated symptoms: no fever   ?Patient has history of hypertension, diabetes, hyperlipidemia and acute stroke. ?Patient presents to the ER for evaluation of headache and left-sided numbness and tingling.  Patient has history of recent stroke, admitted to the hospital on April 15 earlier this month..  Patient an MRI on March 15 that showed acute subacute nonhemorrhagic infarcts involving the right caudate head right parietal lobe and right frontal lobes.  Patient states this evening around 430 he started having recurrent numbness and tingling in his left hand.  Patient also has a headache on the right side of his head.  He denies any trouble with his speech.  No trouble with his vision. ? ?Home Medications ?Prior to Admission medications   ?Medication Sig Start Date End Date Taking? Authorizing Provider  ?acetaminophen (TYLENOL) 500 MG tablet Take 1,000 mg by mouth every 6 (six) hours as needed for mild pain.    [provider]  ?amLODipine (NORVASC) 10 MG tablet Take 1 tablet (10 mg total) by mouth daily. 10/22/21 01/20/22  [place], Eric J, DO  ?aspirin EC 81 MG tablet Take 1 tablet (81 mg total) by mouth daily. Swallow whole. 10/22/21 01/20/22  [place], Eric J, DO  ?Blood Glucose Monitoring Suppl (GLUCOCOM BLOOD GLUCOSE MONITOR) DEVI Please check blood sugar 5 times per day due to fluctuating blood sugars. 05/31/16   [provider]  ?clopidogrel (PLAVIX) 75 MG tablet Take 1 tablet (75 mg total) by mouth daily for 21 days. 10/23/21 11/13/21  [place], Eric J, DO  ?docusate sodium (COLACE) 100 MG capsule Take 1 capsule (100 mg total) by mouth every 12 (twelve) hours. ?Patient taking differently: Take 100 mg by mouth 2 (two)  times daily as needed for moderate constipation. 08/13/21   Antony Madura, PA-C  ?glucose blood test strip Please check blood sugar 5 times per day due to fluctuating blood sugars. 05/31/16   [provider]  ?hydrochlorothiazide (HYDRODIURIL) 25 MG tablet Take 1 tablet (25 mg total) by mouth daily. 10/22/21 01/20/22  [place], Eric J, DO  ?Insulin Glargine (LANTUS SOLOSTAR) 100 UNIT/ML Solostar Pen Inject 15 Units into the skin 2 (two) times daily. ?Patient taking differently: Inject 15 Units into the skin at bedtime. 12/28/14   Ollen Gross, MD  ?metFORMIN (GLUCOPHAGE) 1000 MG tablet Take 1 tablet (1,000 mg total) by mouth daily. ?Patient taking differently: Take 1,000 mg by mouth 2 (two) times daily with a meal. 12/28/14   Ollen Gross, MD  ?rosuvastatin (CRESTOR) 40 MG tablet Take 1 tablet (40 mg total) by mouth daily. 10/23/21 01/21/22  [place], Eric J, DO  ?sertraline (ZOLOFT) 50 MG tablet Take 50 mg by mouth daily. 10/11/21   [provider]  ?   ? ?Allergies    ?Diclofenac sodium, Lisinopril, Losartan, and Voltaren [diclofenac]   ? ?Review of Systems   ?Review of Systems  ?Constitutional:  Negative for fever.  ?Neurological:  Positive for headaches.  ? ?Physical Exam ?Updated Vital Signs ?BP 127/78 (BP Location: Right Arm)   Pulse (!) 101   Temp 98.3 ?F (36.8 ?C)   Resp 16   Ht 1.93 m (6\' 4" )   Wt 120.2 kg   SpO2 98%  BMI 32.26 kg/m?  ?Physical Exam ?Vitals and nursing note reviewed.  ?Constitutional:   ?   General: He is not in acute distress. ?   Appearance: He is well-developed.  ?HENT:  ?   Head: Normocephalic and atraumatic.  ?   Right Ear: External ear normal.  ?   Left Ear: External ear normal.  ?Eyes:  ?   General: No scleral icterus.    ?   Right eye: No discharge.     ?   Left eye: No discharge.  ?   Conjunctiva/sclera: Conjunctivae normal.  ?Neck:  ?   Trachea: No tracheal deviation.  ?Cardiovascular:  ?   Rate and Rhythm: Normal rate and regular rhythm.  ?Pulmonary:  ?   Effort:  Pulmonary effort is normal. No respiratory distress.  ?   Breath sounds: Normal breath sounds. No stridor. No wheezing or rales.  ?Abdominal:  ?   General: Bowel sounds are normal. There is no distension.  ?   Palpations: Abdomen is soft.  ?   Tenderness: There is no abdominal tenderness. There is no guarding or rebound.  ?Musculoskeletal:     ?   General: No tenderness.  ?   Cervical back: Neck supple.  ?Skin: ?   General: Skin is warm and dry.  ?   Findings: No rash.  ?Neurological:  ?   Mental Status: He is alert and oriented to person, place, and time.  ?   Cranial Nerves: No cranial nerve deficit.  ?   Sensory: No sensory deficit.  ?   Motor: No abnormal muscle tone or seizure activity.  ?   Coordination: Coordination normal.  ?   Comments: No pronator drift bilateral upper extrem, able to hold both legs off bed for 5 seconds, sensation intact in all extremities, no visual field cuts, no left or right sided neglect, normal finger-nose exam bilaterally, no nystagmus noted ? ?No facial droop, extraocular movements intact, tongue midline  ? ? ?ED Results / Procedures / Treatments   ?Labs ?(all labs ordered are listed, but only abnormal results are displayed) ?Labs Reviewed  ?CBC - Abnormal; Notable for the following components:  ?    Result Value  ? RBC 4.12 (*)   ? HCT 38.5 (*)   ? MCH 34.5 (*)   ? MCHC 36.9 (*)   ? RDW 11.4 (*)   ? All other components within normal limits  ?COMPREHENSIVE METABOLIC PANEL - Abnormal; Notable for the following components:  ? Sodium 129 (*)   ? Chloride 96 (*)   ? Glucose, Bld 172 (*)   ? Creatinine, Ser 1.52 (*)   ? Total Bilirubin 1.9 (*)   ? GFR, Estimated 50 (*)   ? All other components within normal limits  ?ETHANOL  ?PROTIME-INR  ?APTT  ?DIFFERENTIAL  ?RAPID URINE DRUG SCREEN, HOSP PERFORMED  ?URINALYSIS, ROUTINE W REFLEX MICROSCOPIC  ? ? ?EKG ?EKG Interpretation ? ?Date/Time:  Wednesday October 31 2021 21:27:59 EDT ?Ventricular Rate:  91 ?PR Interval:  154 ?QRS  Duration: 84 ?QT Interval:  376 ?QTC Calculation: 462 ?R Axis:   115 ?Text Interpretation: Normal sinus rhythm Right axis deviation Abnormal ECG When compared with ECG of 21-Oct-2021 15:19, No significant change since last tracing Confirmed by Linwood Dibbles [phone]) on 10/31/2021 9:45:48 PM ? ?Radiology ?No results found. ? ?Procedures ?Procedures  ? ? ?Medications Ordered in ED ?Medications  ?sodium chloride 0.9 % bolus 500 mL (0 mLs Intravenous Stopped 10/31/21 2253)  ?  Followed  by  ?0.9 %  sodium chloride infusion (100 mL/hr Intravenous New Bag/Given 10/31/21 2139)  ?acetaminophen (TYLENOL) tablet 650 mg (650 mg Oral Given 10/31/21 2232)  ? ? ?ED Course/ Medical Decision Making/ A&P ?Clinical Course as of 11/01/21 0004  ?Wed Oct 31, 2021  ?2154 D/w Dr Melina FiddlerKaliqdina.  Will proceed with MRI.  Requests call back after MRI complete.  [JK]  ?2229 CBC(!) ?Normal [JK]  ?2229 Comprehensive metabolic panel(!) ?Creatinine increased compared to last [JK]  ?2229 Ethanol ?Normal [JK]  ?2357 CBC(!) [JK]  ?2357 Comprehensive metabolic panel(!) ?Hyponatremia similar to previous values [JK]  ?2357 Comprehensive metabolic panel(!) ?Creatinine elevated compared to previous [JK]  ?  ?Clinical Course User Index ?[JK] Linwood DibblesKnapp, Kyliee Ortego, MD  ? ?                        ?Medical Decision Making ?Amount and/or Complexity of Data Reviewed ?Labs: ordered. Decision-making details documented in ED Course. ?Radiology: ordered. ? ?Risk ?OTC drugs. ?Prescription drug management. ? ? ?Pt presented with recurrent hand numbness weakness, headache, recent stroke.  Neuro exam improved at time of my exam.  Case discussed with Dr Melina FiddlerKaliqdina.  Will plan on repeat MRI and discuss results with him.  Pt and family updated on plan.  Care turned over to Dr Adela LankFloyd at shift change. ? ? ? ? ? ? ? ?Final Clinical Impression(s) / ED Diagnoses ?Final diagnoses:  ?Weakness  ? ? ?Rx / DC Orders ?ED Discharge Orders   ? ? None  ? ?  ? ? ?  ?Linwood DibblesKnapp, Norinne Jeane, MD ?11/01/21 0004 ? ?

## 2021-10-31 NOTE — ED Triage Notes (Signed)
Patient arrived via EMS from home with complaints of headache and left hand numbness and tingling, onset around 1630. Patient states he had stroke couple weeks ago. Stroke screen negative per EMS. EMS reported BP130/74, HR-90, SpO2-98% on room air, CBG-124.  ?

## 2021-11-01 ENCOUNTER — Observation Stay (HOSPITAL_COMMUNITY): Payer: Medicare HMO

## 2021-11-01 ENCOUNTER — Encounter (HOSPITAL_COMMUNITY): Payer: Self-pay | Admitting: Internal Medicine

## 2021-11-01 DIAGNOSIS — Z7984 Long term (current) use of oral hypoglycemic drugs: Secondary | ICD-10-CM | POA: Diagnosis not present

## 2021-11-01 DIAGNOSIS — Z87891 Personal history of nicotine dependence: Secondary | ICD-10-CM | POA: Diagnosis not present

## 2021-11-01 DIAGNOSIS — E871 Hypo-osmolality and hyponatremia: Secondary | ICD-10-CM | POA: Diagnosis present

## 2021-11-01 DIAGNOSIS — Z6832 Body mass index (BMI) 32.0-32.9, adult: Secondary | ICD-10-CM | POA: Diagnosis not present

## 2021-11-01 DIAGNOSIS — Z96653 Presence of artificial knee joint, bilateral: Secondary | ICD-10-CM | POA: Diagnosis present

## 2021-11-01 DIAGNOSIS — Z833 Family history of diabetes mellitus: Secondary | ICD-10-CM | POA: Diagnosis not present

## 2021-11-01 DIAGNOSIS — I639 Cerebral infarction, unspecified: Secondary | ICD-10-CM | POA: Diagnosis not present

## 2021-11-01 DIAGNOSIS — Z8673 Personal history of transient ischemic attack (TIA), and cerebral infarction without residual deficits: Secondary | ICD-10-CM | POA: Diagnosis not present

## 2021-11-01 DIAGNOSIS — Z794 Long term (current) use of insulin: Secondary | ICD-10-CM | POA: Diagnosis not present

## 2021-11-01 DIAGNOSIS — Z7982 Long term (current) use of aspirin: Secondary | ICD-10-CM | POA: Diagnosis not present

## 2021-11-01 DIAGNOSIS — R531 Weakness: Secondary | ICD-10-CM | POA: Diagnosis present

## 2021-11-01 DIAGNOSIS — E876 Hypokalemia: Secondary | ICD-10-CM | POA: Diagnosis not present

## 2021-11-01 DIAGNOSIS — E119 Type 2 diabetes mellitus without complications: Secondary | ICD-10-CM

## 2021-11-01 DIAGNOSIS — E785 Hyperlipidemia, unspecified: Secondary | ICD-10-CM

## 2021-11-01 DIAGNOSIS — I10 Essential (primary) hypertension: Secondary | ICD-10-CM

## 2021-11-01 DIAGNOSIS — E861 Hypovolemia: Secondary | ICD-10-CM | POA: Diagnosis present

## 2021-11-01 DIAGNOSIS — Z7902 Long term (current) use of antithrombotics/antiplatelets: Secondary | ICD-10-CM | POA: Diagnosis not present

## 2021-11-01 DIAGNOSIS — F1210 Cannabis abuse, uncomplicated: Secondary | ICD-10-CM | POA: Diagnosis present

## 2021-11-01 DIAGNOSIS — N179 Acute kidney failure, unspecified: Secondary | ICD-10-CM | POA: Diagnosis present

## 2021-11-01 DIAGNOSIS — I63511 Cerebral infarction due to unspecified occlusion or stenosis of right middle cerebral artery: Secondary | ICD-10-CM | POA: Diagnosis present

## 2021-11-01 DIAGNOSIS — Z79899 Other long term (current) drug therapy: Secondary | ICD-10-CM | POA: Diagnosis not present

## 2021-11-01 DIAGNOSIS — E669 Obesity, unspecified: Secondary | ICD-10-CM | POA: Diagnosis present

## 2021-11-01 DIAGNOSIS — R931 Abnormal findings on diagnostic imaging of heart and coronary circulation: Secondary | ICD-10-CM | POA: Diagnosis not present

## 2021-11-01 LAB — CBG MONITORING, ED
Glucose-Capillary: 147 mg/dL — ABNORMAL HIGH (ref 70–99)
Glucose-Capillary: 122 mg/dL — ABNORMAL HIGH (ref 70–99)
Glucose-Capillary: 148 mg/dL — ABNORMAL HIGH (ref 70–99)

## 2021-11-01 LAB — RAPID URINE DRUG SCREEN, HOSP PERFORMED
Amphetamines: NOT DETECTED
Barbiturates: NOT DETECTED
Benzodiazepines: NOT DETECTED
Cocaine: NOT DETECTED
Opiates: NOT DETECTED
Tetrahydrocannabinol: POSITIVE — AB

## 2021-11-01 LAB — URINALYSIS, ROUTINE W REFLEX MICROSCOPIC
Bilirubin Urine: NEGATIVE
Glucose, UA: NEGATIVE mg/dL
Hgb urine dipstick: NEGATIVE
Ketones, ur: NEGATIVE mg/dL
Leukocytes,Ua: NEGATIVE
Nitrite: NEGATIVE
Protein, ur: NEGATIVE mg/dL
Specific Gravity, Urine: 1.029 (ref 1.005–1.030)
pH: 5 (ref 5.0–8.0)

## 2021-11-01 LAB — MAGNESIUM: Magnesium: 1.5 mg/dL — ABNORMAL LOW (ref 1.7–2.4)

## 2021-11-01 LAB — COMPREHENSIVE METABOLIC PANEL
ALT: 17 U/L (ref 0–44)
AST: 21 U/L (ref 15–41)
Albumin: 3.8 g/dL (ref 3.5–5.0)
Alkaline Phosphatase: 45 U/L (ref 38–126)
Anion gap: 11 (ref 5–15)
BUN: 20 mg/dL (ref 8–23)
CO2: 22 mmol/L (ref 22–32)
Calcium: 9.3 mg/dL (ref 8.9–10.3)
Chloride: 96 mmol/L — ABNORMAL LOW (ref 98–111)
Creatinine, Ser: 1.37 mg/dL — ABNORMAL HIGH (ref 0.61–1.24)
GFR, Estimated: 57 mL/min — ABNORMAL LOW (ref >60)
Glucose, Bld: 162 mg/dL — ABNORMAL HIGH (ref 70–99)
Potassium: 3.2 mmol/L — ABNORMAL LOW (ref 3.5–5.1)
Sodium: 129 mmol/L — ABNORMAL LOW (ref 135–145)
Total Bilirubin: 1.6 mg/dL — ABNORMAL HIGH (ref 0.3–1.2)
Total Protein: 6.7 g/dL (ref 6.5–8.1)

## 2021-11-01 LAB — CBC
HCT: 31.8 % — ABNORMAL LOW (ref 39.0–52.0)
Hemoglobin: 11.8 g/dL — ABNORMAL LOW (ref 13.0–17.0)
MCH: 34.6 pg — ABNORMAL HIGH (ref 26.0–34.0)
MCHC: 37.1 g/dL — ABNORMAL HIGH (ref 30.0–36.0)
MCV: 93.3 fL (ref 80.0–100.0)
Platelets: 272 10*3/uL (ref 150–400)
RBC: 3.41 MIL/uL — ABNORMAL LOW (ref 4.22–5.81)
RDW: 11.5 % (ref 11.5–15.5)
WBC: 8.3 10*3/uL (ref 4.0–10.5)
nRBC: 0 % (ref 0.0–0.2)

## 2021-11-01 LAB — GLUCOSE, CAPILLARY: Glucose-Capillary: 145 mg/dL — ABNORMAL HIGH (ref 70–99)

## 2021-11-01 MED ORDER — IOHEXOL 350 MG/ML SOLN
75.0000 mL | Freq: Once | INTRAVENOUS | Status: AC | PRN
  Administered 2021-11-01: 75 mL via INTRAVENOUS

## 2021-11-01 MED ORDER — STUDY - OCEANIC-STROKE - ASUNDEXIAN 50 MG OR PLACEBO TABLET (PI-SETHI)
1.0000 | ORAL_TABLET | Freq: Every day | ORAL | Status: DC
  Filled 2021-11-01: qty 1

## 2021-11-01 MED ORDER — STUDY - OCEANIC-STROKE - ASUNDEXIAN 50 MG OR PLACEBO TABLET (PI-SETHI)
1.0000 | ORAL_TABLET | Freq: Every day | ORAL | Status: DC
  Administered 2021-11-01 – 2021-11-02 (×2): 50 mg via ORAL
  Filled 2021-11-01: qty 1

## 2021-11-01 MED ORDER — SODIUM CHLORIDE 0.9 % IV SOLN: INTRAVENOUS | Status: DC

## 2021-11-01 MED ORDER — STROKE: EARLY STAGES OF RECOVERY BOOK: Freq: Once | Status: DC

## 2021-11-01 MED ORDER — INSULIN ASPART 100 UNIT/ML IJ SOLN
0.0000 [IU] | Freq: Three times a day (TID) | INTRAMUSCULAR | Status: DC
  Administered 2021-11-01 – 2021-11-02 (×4): 1 [IU] via SUBCUTANEOUS

## 2021-11-01 MED ORDER — SODIUM CHLORIDE 0.9 % IV BOLUS
500.0000 mL | Freq: Once | INTRAVENOUS | Status: AC
  Administered 2021-11-01: 500 mL via INTRAVENOUS

## 2021-11-01 MED ORDER — ACETAMINOPHEN 160 MG/5ML PO SOLN: 650.0000 mg | ORAL | Status: DC | PRN

## 2021-11-01 MED ORDER — HYDRALAZINE HCL 20 MG/ML IJ SOLN: 10.0000 mg | INTRAMUSCULAR | Status: DC | PRN

## 2021-11-01 MED ORDER — ROSUVASTATIN CALCIUM 20 MG PO TABS
40.0000 mg | ORAL_TABLET | Freq: Every day | ORAL | Status: DC
  Administered 2021-11-01 – 2021-11-02 (×2): 40 mg via ORAL
  Filled 2021-11-01 (×2): qty 2

## 2021-11-01 MED ORDER — MAGNESIUM SULFATE 4 GM/100ML IV SOLN
4.0000 g | Freq: Once | INTRAVENOUS | Status: AC
  Administered 2021-11-01: 4 g via INTRAVENOUS
  Filled 2021-11-01 (×2): qty 100

## 2021-11-01 MED ORDER — ASPIRIN EC 81 MG PO TBEC
81.0000 mg | DELAYED_RELEASE_TABLET | Freq: Every day | ORAL | Status: DC
  Administered 2021-11-01 – 2021-11-02 (×2): 81 mg via ORAL
  Filled 2021-11-01 (×2): qty 1

## 2021-11-01 MED ORDER — POTASSIUM CHLORIDE CRYS ER 20 MEQ PO TBCR
40.0000 meq | EXTENDED_RELEASE_TABLET | ORAL | Status: AC
  Administered 2021-11-01 (×2): 40 meq via ORAL
  Filled 2021-11-01 (×2): qty 2

## 2021-11-01 MED ORDER — SERTRALINE HCL 50 MG PO TABS
50.0000 mg | ORAL_TABLET | Freq: Every day | ORAL | Status: DC
  Administered 2021-11-01 – 2021-11-02 (×2): 50 mg via ORAL
  Filled 2021-11-01 (×3): qty 1

## 2021-11-01 MED ORDER — CLOPIDOGREL BISULFATE 75 MG PO TABS
75.0000 mg | ORAL_TABLET | Freq: Every day | ORAL | Status: DC
  Administered 2021-11-01: 75 mg via ORAL
  Filled 2021-11-01: qty 1

## 2021-11-01 MED ORDER — ENOXAPARIN SODIUM 40 MG/0.4ML IJ SOSY
40.0000 mg | PREFILLED_SYRINGE | INTRAMUSCULAR | Status: DC
  Administered 2021-11-01: 40 mg via SUBCUTANEOUS
  Filled 2021-11-01: qty 0.4

## 2021-11-01 MED ORDER — TICAGRELOR 90 MG PO TABS
90.0000 mg | ORAL_TABLET | Freq: Two times a day (BID) | ORAL | Status: DC
  Administered 2021-11-02: 90 mg via ORAL
  Filled 2021-11-01: qty 1

## 2021-11-01 MED ORDER — INSULIN GLARGINE-YFGN 100 UNIT/ML ~~LOC~~ SOLN
6.0000 [IU] | Freq: Every day | SUBCUTANEOUS | Status: DC
  Administered 2021-11-01: 6 [IU] via SUBCUTANEOUS
  Filled 2021-11-01 (×2): qty 0.06

## 2021-11-01 MED ORDER — ACETAMINOPHEN 325 MG PO TABS
650.0000 mg | ORAL_TABLET | ORAL | Status: DC | PRN
  Administered 2021-11-01: 650 mg via ORAL
  Filled 2021-11-01: qty 2

## 2021-11-01 MED ORDER — INSULIN GLARGINE-YFGN 100 UNIT/ML ~~LOC~~ SOLN
15.0000 [IU] | Freq: Every day | SUBCUTANEOUS | Status: DC
  Filled 2021-11-01: qty 0.15

## 2021-11-01 MED ORDER — ACETAMINOPHEN 650 MG RE SUPP: 650.0000 mg | RECTAL | Status: DC | PRN

## 2021-11-01 NOTE — ED Notes (Signed)
Patient transported to Ultrasound 

## 2021-11-01 NOTE — Progress Notes (Signed)
TCD bubble study completed with Dr. Pearlean Brownie. ?Refer to "CV Proc" under chart review to view preliminary results. ? ?11/01/2021 1:59 PM ?Eula Fried., MHA, RVT, RDCS, RDMS   ?

## 2021-11-01 NOTE — Progress Notes (Signed)
PT Cancellation Note ? ?Patient Details ?Name: Jerry MillingBarry D Delaluz ?MRN: [id number] ?DOB: 1956-06-02 ? ? ?Cancelled Treatment:    Reason Eval/Treat Not Completed: Other (comment).  Pt declines PT for now, reporting he already walked with OT and is fine.  No further PT needs are identified.   ? ? ?[name] ?11/01/2021, 12:16 PM ? ?[name], PT PhD ?[phone] and [facility] [phone] ? ?

## 2021-11-01 NOTE — Progress Notes (Addendum)
? ?  Shared Decision Making/Informed Consent ? ?The risks [esophageal damage, perforation (1:10,000 risk), bleeding, pharyngeal hematoma as well as other potential complications associated with conscious sedation including aspiration, arrhythmia, respiratory failure and death], benefits (treatment guidance and diagnostic support) and alternatives of a transesophageal echocardiogram were discussed in detail with Jerry [name] and he is willing to proceed.   ? ?TEE scheduled for tomorrow , NPO after midnight, see orders. Please replace for hypokalemia and check Mag level.  ?

## 2021-11-01 NOTE — Progress Notes (Signed)
[place] Investigational Drug Service ?New Study Start: OCEANIC-STROKE ?  ?SUMMARY ?For more information refer to: [email or web address]. Study Identifier: [id number] ?   ?A Multicenter, International, Randomized, Placebo Controlled, Double-blind, Parallel Group and Event Driven Phase 3 Study of the Oral FXIa Inhibitor Asundexian ([place] [id number]) for the Prevention of Ischemic Stroke in Male and Male Participants Aged 66 Years and Older After an Acute Non-cardioembolic Ischemic Stroke or High-risk TIA  ?Brief Summary This study will randomize adult patients with an initial diagnosis of acute non-cardioembolic ischemic stroke or high-risk transient ischemic attack (TIA) to treatment within 72 hours of symptom onset and with the intention to be treated with antiplatelet therapy.  ?Design Phase 3, Randomized, Interventional, Event-Driven  ?Intervention Asundexian [phone]) 50 mg tablets or matching placebo tablets (pink, oval, film-coated tablets  ?Concomitant Therapy Participants are to receive antiplatelet therapy (single or dual; provider discretion) during the study conduct.  ?Prohibited Therapy Oral anticoagulation ?Full dose and/or long-term anticoagulation therapy with heparin or LMWH  ?Chronic (more than 4 weeks continuous) therapy with NSAIDs during the study conduct ?Concomitant use of combined P-gp and strong/moderate CYP3A4 inducers ?Concomitant use of combined P-gp and strong CYP3A4 inhibitors ?Herbal/traditional medications and/or supplements with known anticoagulant/antiplatelet effects  ?Anticoagulation Prophylaxis Venous thromboembolism prophylaxis with LMWH or unfractionated heparin for short periods of time (2 weeks) is allowed.  ?Potential Drug-Drug Interactions Rosuvastatin 20 mg daily or higher or atorvastatin 80 mg (additional monitoring may be warranted due to increased concentrations of rosuvastatin and atorvastatin - asundexian [id number]) is a weak inhibitor of CYP2C8)  ?Administration   Can be taken irrespective of food intake. Should be swallowed whole with water, preferably in the morning. CANNOT be crushed or broken.  ?  ?  ?Plan: Start Cecille Rubin [phone]) 50 mg tablets or placebo] this evening. Study medication must be picked up from pharmacy, medication can not be tubed.  ?  ?Please contact IDS if any questions or concerns regarding the study medication.  ?  ?Acey Lav, PharmD, BCPS ?Investigational Drug Service Pharmacist  ?[phone] ? ?

## 2021-11-01 NOTE — Progress Notes (Addendum)
For STROKE TEAM PROGRESS NOTE  ? ?INTERVAL HISTORY ?Jerry [name] is a 65 y.o. 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 MRI brain showed several new R hemispheric infarcts.  ? ?The patient is seen in his room this morning with no family at bedside. He reports full compliance with his DAPT regimen and a full recovery from the symptoms of his last stroke. He feels his most recent set of symptoms (as detailed above) have resolved. Exam is notable for decreased L grip strength, positive L orbit sign, decreased rapid finger movements, and mild L hand ataxia. ? ?Loop recorder was interrogated by EP and showed no evidence of AF. Patient may enroll in [facility] trial.  ? ?Vitals:  ? 11/01/21 0900 11/01/21 1100 11/01/21 1130 11/01/21 1230  ?BP: 124/68 114/65 112/73 116/85  ?Pulse: 82  75 83  ?Resp: 18 16 17 16   ?Temp:      ?TempSrc:      ?SpO2: 95%  97% 99%  ?Weight:      ?Height:      ? ?CBC:  ?Recent Labs  ?Lab 10/31/21 ?2122 11/01/21 ?0431  ?WBC 7.4 8.3  ?NEUTROABS 4.1  --   ?HGB 14.2 11.8*  ?HCT 38.5* 31.8*  ?MCV 93.4 93.3  ?PLT 262 272  ? ?Basic Metabolic Panel:  ?Recent Labs  ?Lab 10/31/21 ?2122 11/01/21 ?0431  ?NA 129* 129*  ?K 3.5 3.2*  ?CL 96* 96*  ?CO2 23 22  ?GLUCOSE 172* 162*  ?BUN 20 20  ?CREATININE 1.52* 1.37*  ?CALCIUM 9.5 9.3  ? ?Lipid Panel: No results for input(s): CHOL, TRIG, HDL, CHOLHDL, VLDL, LDLCALC in the last 168 hours. ?HgbA1c: No results for input(s): HGBA1C in the last 168 hours. ?Urine Drug Screen:  ?Recent Labs  ?Lab 11/01/21 ?0736  ?LABOPIA NONE DETECTED  ?COCAINSCRNUR NONE DETECTED  ?LABBENZ NONE DETECTED  ?AMPHETMU NONE DETECTED  ?THCU POSITIVE*  ?LABBARB NONE DETECTED  ?   ?Alcohol Level  ?Recent Labs  ?Lab 10/31/21 ?2122  ?ETH <10  ? ? ?IMAGING past 24 hours ?CT ANGIO HEAD NECK W WO CM ? ?Result Date: 11/01/2021 ?CLINICAL DATA:  66 year old male with headache and left side symptoms. Scattered small acute infarcts throughout the right cerebral hemisphere on MRI yesterday. EXAM: CT ANGIOGRAPHY HEAD AND NECK TECHNIQUE: Multidetector CT imaging of the head and neck was performed using the standard protocol during bolus administration of intravenous contrast. Multiplanar CT image reconstructions and MIPs were obtained to evaluate the vascular anatomy. Carotid stenosis measurements (when applicable) are obtained utilizing NASCET criteria, using the distal internal carotid diameter as the denominator. RADIATION DOSE REDUCTION: This exam was performed according to the departmental dose-optimization program which includes automated exposure control, adjustment of the mA and/or kV according to patient size and/or use of iterative reconstruction technique. CONTRAST:  25mL OMNIPAQUE IOHEXOL 350 MG/ML SOLN COMPARISON:  Brain MRI 10/31/2021. CTA head and neck 10/20/2021 FINDINGS: CT HEAD Brain: No midline shift, mass effect, or evidence of intracranial mass lesion. No ventriculomegaly. No acute intracranial hemorrhage identified. Occasional small hypodense areas in the right hemisphere corresponding to the abnormal diffusion yesterday. And scattered small calcific foci in the hemisphere suggesting embolized plaque (series 10, image  22). But otherwise stable gray-white matter differentiation from the CT earlier this month. Calvarium and skull base: No acute osseous abnormality identified. Paranasal sinuses: Mild left mastoid effusion is stable. Other Visualized paranasal sinuses and mastoids are stable and well aerated. Orbits: No acute orbit or scalp soft tissue finding. CTA NECK Skeleton: No acute osseous abnormality identified. Absent maxillary dentition and chronic cervical spine degeneration  with occasional ankylosis. Upper chest: Visible upper lungs and mediastinum are stable. Other neck: Stable, no acute finding. Aortic arch: Chronic soft plaque or adherent thrombus in the distal aortic arch is slightly more conspicuous since 10/20/2021 on series 7, image 78 and series 12, image 165. Three vessel arch configuration. Mild calcified arch atherosclerosis. Right carotid system: Mild brachiocephalic artery plaque without stenosis. Negative right CCA origin. Partially retropharyngeal course of the right carotid. Stable moderate soft and calcified plaque at the right ICA origin and bulb without stenosis. Left carotid system: Stable mild left CCA plaque without stenosis. Stable mild to moderate soft and calcified plaque at the left carotid bifurcation and ICA bulb without stenosis. Vertebral arteries: Stable soft and calcified plaque at the right subclavian artery origin without stenosis. Non dominant right vertebral artery origin is within normal limits. Right vertebral artery remains non dominant but patent to the skull base with no significant plaque or stenosis. Minor plaque in the proximal left subclavian artery without stenosis. Normal left vertebral artery origin. Dominant left vertebral with mildly tortuous V1 and V2 segments. No left vertebral plaque or stenosis to the skull base. CTA HEAD Posterior circulation: Non dominant right vertebral artery continues to the vertebrobasilar junction but is diminutive beyond the normal right PICA origin. No distal right vertebral stenosis. But there is moderate calcified plaque and stenosis of the dominant left vertebral V4 segment which is stable on series 11, image 168. Patent left PICA. Patent basilar artery with mild mid basilar irregularity and stenosis (series 15 image 28). Patent SCA and PCA origins with fetal type right PCA redemonstrated. Moderate to severe left PCA distal P2 stenosis appears increased today on series 16, image 24. Preserved distal left  PCA branches. No proximal right PCA stenosis. Superior division right P3 moderate to severe stenosis is also more apparent on series 16, image 19. Anterior circulation: Both ICA siphons remain patent with extensive calcified plaque. On the left there is moderate to severe proximal cavernous stenosis on series 11, image 131 and mild to moderate supraclinoid stenosis. On the right there is mild stenosis before the anterior genu and moderate supraclinoid stenosis on series 12, image 110. Normal right posterior communicating artery origin. Patent carotid termini. MCA and ACA origins are stable. ACA branches are stable and within normal limits. Diminutive or absent anterior communicating artery. Left MCA M1 segment is stable with mild irregularity. Mild to moderate anterior division M2 origin stenosis is stable on series 14, image 21. Other left MCA branches are within normal limits. Right MCA M1 segment is stable with mild irregularity. Patent right MCA bifurcation without stenosis. Right MCA branches are stable with only mild irregularity. Venous sinuses: Early contrast timing, grossly patent. Anatomic variants: Dominant left vertebral artery. Fetal type right PCA origin. Review of the MIP images confirms the above findings IMPRESSION: 1. Negative for large vessel occlusion. And the anterior circulation appears stable from the CTA earlier this month, notable for: - extensive bilateral ICA siphon up to moderate plaque with right supraclinoid and moderate to severe left cavernous and supraclinoid stenosis. - moderate stenosis left MCA anterior division  M2 origin. 2. 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 Left PCA P2 and Right PCA P3 moderate to severe stenoses appear increased since 10/20/2021. 3. Adherent plaque or thrombus in the distal aortic arch redemonstrated measuring about 1 cm. Aortic Atherosclerosis (ICD10-I70.0). 4. Expected CT appearance of the small right MCA infarcts seen  yesterday. No associated hemorrhage or mass effect. No new intracranial abnormality. Electronically Signed   By: Genevie Ann M.D.   On: 11/01/2021 04:17  ? ?DG Chest 1 View ? ?Result Date: 11/01/2021 ?CLINICAL DA

## 2021-11-01 NOTE — Consult Note (Signed)
NEUROLOGY CONSULTATION NOTE  ? ?Date of service: November 01, 2021 ?Patient Name: Jerry [name] ?MRN:  [id number] ?DOB:  06-23-1956 ?Reason for consult: "Stroke on MRIs" ?Requesting Provider: Rise Patience, MD ?_ _ _   _ __   _ __ _ _  __ __   _ __   __ _ ? ?History of Present Illness  ?Jerry [name] is a 66 y.o. 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 MRI Brain this time shows several additional infarcts again all of them R hemispheric strokes. ? ?Reports sudden onset R hand numbness along with R lower facial numbness. He has trouble with opening fingers on his R hand and coordination of his R hand. ? ?Of note, he saw to establish with PCP and notes mention "multiple premature beats heard upon physical exam and recommend to follow-up with cardiologist to have loop recorder evaluated" ? ?LKW: 1630 on 10/31/21 ?mRS: 0 ?tNKASE: not offered, recent strokes. ?Thrombectomy: not offered, symptoms too mild. ?NIHSS components Score: Comment  ?1a Level of Conscious 0[x]  1[]  2[]  3[]      ?1b LOC Questions 0[x]  1[]  2[]       ?1c LOC Commands 0[x]  1[]  2[]       ?2 Best Gaze 0[x]  1[]  2[]       ?3 Visual 0[x]  1[]  2[]  3[]      ?4 Facial Palsy 0[x]  1[]  2[]  3[]      ?5a Motor Arm - left 0[x]  1[]  2[]  3[]  4[]  UN[]    ?5b Motor Arm - Right 0[x]  1[]  2[]  3[]  4[]  UN[]    ?6a Motor Leg - Left 0[x]  1[]  2[]  3[]  4[]  UN[]    ?6b Motor Leg - Right 0[x]  1[]  2[]  3[]  4[]  UN[]    ?7 Limb Ataxia 0[x]  1[]  2[]  3[]  UN[]     ?8 Sensory 0[x]  1[]  2[]  UN[]      ?9 Best Language 0[x]  1[]  2[]  3[]      ?10 Dysarthria 0[x]  1[]  2[]  UN[]      ?11 Extinct. and Inattention 0[x]  1[]  2[]       ?TOTAL: 0   ? ?  ?ROS  ? ?Constitutional Denies weight loss, fever and  chills.   ?HEENT Denies changes in vision and hearing.   ?Respiratory Denies SOB and cough.   ?CV Denies palpitations and CP   ?GI Denies abdominal pain, nausea, vomiting and diarrhea.   ?GU Denies dysuria and urinary frequency.   ?MSK Denies myalgia and joint pain.   ?Skin Denies rash and pruritus.   ?Neurological R temple headache but no syncope.   ?Psychiatric Denies recent changes in mood. Denies anxiety and depression.  ? ?Past History  ? ?Past Medical History:  ?Diagnosis Date  ? Arthritis   ? Diabetes mellitus 2008  ? type II  ? Dyslipidemia   ? Erectile dysfunction   ? History of cardiac catheterization 06/2013  ? Dr. Peter Martinique  ? HTN (hypertension)   ? Hypogonadism male   ? ?Past Surgical History:  ?Procedure Laterality Date  ? APPENDECTOMY    ? COLONOSCOPY  08/2006  ? Dr. Fuller Plan - repeat 2018  ? INCISION AND DRAINAGE PERIRECTAL ABSCESS    ? KNEE ARTHROSCOPY Left 12/28/2014  ? Procedure: ARTHROSCOPY LEFT KNEE WITH  MEDIAL MENSICUS DEBRIDEMENT;  Surgeon: Gaynelle Arabian, MD;  Location: [facility];  Service: Orthopedics;  Laterality: Left;  ? KNEE SURGERY    ? left knee - remote past  ? LEFT HEART CATHETERIZATION WITH CORONARY ANGIOGRAM N/A 06/11/2013  ? Procedure: LEFT HEART CATHETERIZATION WITH CORONARY ANGIOGRAM;  Surgeon: Peter M Martinique, MD;  Location: [facility] CATH LAB;  Service: Cardiovascular;  Laterality: N/A;  ? LOOP RECORDER INSERTION N/A 10/22/2021  ? Procedure: LOOP RECORDER INSERTION;  Surgeon: Deboraha Sprang, MD;  Location: [facility] CV LAB;  Service: Cardiovascular;  Laterality: N/A;  ? right arm and elbow surgery    ? TONSILLECTOMY    ? TOTAL KNEE ARTHROPLASTY Left 10/04/2015  ? Procedure: LEFT TOTAL KNEE ARTHROPLASTY right knee aspiration and injection;  Surgeon: Gaynelle Arabian, MD;  Location: [facility];  Service: Orthopedics;  Laterality: Left;  ? TOTAL KNEE ARTHROPLASTY Right 11/04/2016  ? Procedure: RIGHT TOTAL KNEE ARTHROPLASTY;  Surgeon: Gaynelle Arabian, MD;  Location: [facility];  Service: Orthopedics;   Laterality: Right;  with abductor block  ? wisdom teeth extractions    ? ?Family History  ?Problem Relation Age of Onset  ? Diabetes Mother   ? Other Father   ?     death by MVA  ? Other Brother   ?     died of MVA  ? Stroke Neg Hx   ? Heart disease Neg Hx   ? Cancer Neg Hx   ? Hypertension Neg Hx   ? ?Social History  ? ?Socioeconomic History  ? Marital status: Married  ?  Spouse name: Not on file  ? Number of children: Not on file  ? Years of education: Not on file  ? Highest education level: Not on file  ?Occupational History  ? Occupation: retired  ?  Comment: prior physical therapist  ?Tobacco Use  ? Smoking status: Former  ?  Types: Cigarettes  ?  Quit date: 11/11/2001  ?  Years since quitting: 19.9  ? Smokeless tobacco: Never  ?Substance and Sexual Activity  ? Alcohol use: No  ? Drug use: No  ? Sexual activity: Never  ?Other Topics Concern  ? Not on file  ?Social History Narrative  ? Married 75 years, 2 daughter and 1 son. Retired, used to work as a PT.  Exercises treadmill daily, some weight bearing exercise. Diet   ? ?Social Determinants of Health  ? ?Financial Resource Strain: Not on file  ?Food Insecurity: Not on file  ?Transportation Needs: Not on file  ?Physical Activity: Not on file  ?Stress: Not on file  ?Social Connections: Not on file  ? ?Allergies  ?Allergen Reactions  ? Diclofenac Sodium Other (See Comments)  ?  Gel caused a burning sensation   ? Lisinopril Cough  ? Losartan Cough  ?  Unknown bad side effects ?Unknown bad side effects  ? Voltaren [Diclofenac] Other (See Comments)  ?  Gel caused a burning sensation   ? ? ?Medications  ?(Not in a hospital admission) ?  ? ?Vitals  ? ?Vitals:  ? 10/31/21 2047 10/31/21 2052 11/01/21 0016  ?BP: 127/78  (!) 152/92  ?Pulse: (!) 101  89  ?Resp: 16  16  ?Temp: 98.3 ?F (36.8 ?C)  98.3 ?F (36.8 ?C)  ?TempSrc:   Oral  ?SpO2: 98%  97%  ?Weight:  120.2 kg   ?Height:  6\' 4"  (1.93 m)   ?  ? ?Body mass index is 32.26 kg/m?. ? ?Physical Exam  ? ?General: Laying  comfortably in bed; in no acute distress.  ?HENT: Normal oropharynx and mucosa. Normal external appearance of ears and nose.  ?Neck: Supple, no pain or tenderness  ?CV: No JVD. No peripheral edema.  ?Pulmonary: Symmetric Chest rise. Normal respiratory effort.  ?Abdomen: Soft to touch, non-tender.  ?Ext: No cyanosis, edema, or deformity  ?Skin: No rash. Normal palpation of skin.   ?Musculoskeletal: Normal digits and nails by inspection. No clubbing.  ? ?Neurologic Examination  ?Mental status/Cognition: Alert, oriented to self, place, month and year, good attention.  ?Speech/language: Fluent, comprehension intact, object naming intact, repetition intact.  ?Cranial nerves:  ? CN II Pupils equal and reactive to light, no VF deficits   ? CN III,IV,VI EOM intact, no gaze preference or deviation, no nystagmus   ? CN V normal sensation in V1, V2, and V3 segments bilaterally   ? CN VII no asymmetry, no nasolabial fold flattening   ? CN VIII normal hearing to speech   ? CN IX & X normal palatal elevation, no uvular deviation   ? CN XI 5/5 head turn and 5/5 shoulder shrug bilaterally   ? CN XII midline tongue protrusion   ? ?Motor:  ?Muscle bulk: normal, tone normal, pronator drift none tremor none ?Mvmt Root Nerve  Muscle Right Left Comments  ?SA C5/6 Ax Deltoid 5 5   ?EF C5/6 Mc Biceps 5 5   ?EE C6/7/8 Rad Triceps 5 5   ?WF C6/7 Med FCR     ?WE C7/8 PIN ECU     ?F Ab C8/T1 U ADM/FDI 3 5   ?HF L1/2/3 Fem Illopsoas 5 5   ?KE L2/3/4 Fem Quad 5 5   ?DF L4/5 D Peron Tib Ant 5 5   ?PF S1/2 Tibial Grc/Sol 5 5   ? ?Reflexes: ? Right Left Comments  ?Pectoralis     ? Biceps (C5/6) 2 2   ?Brachioradialis (C5/6) 2 2   ? Triceps (C6/7) 2 2   ? Patellar (L3/4) 2 2   ? Achilles (S1)     ? Hoffman     ? Plantar     ?Jaw jerk   ? ?Sensation: ? Light touch Intact throughout  ? Pin prick   ? Temperature   ? Vibration   ?Proprioception   ? ?Coordination/Complex Motor:  ?- Finger to Nose intact BL ?- Heel to shin intact BL ?- Rapid alternating  movement are normal ?- Gait: Deferred. ? ?Labs  ? ?CBC:  ?Recent Labs  ?Lab 10/31/21 ?2122  ?WBC 7.4  ?NEUTROABS 4.1  ?HGB 14.2  ?HCT 38.5*  ?MCV 93.4  ?PLT 262  ? ? ?Basic Metabolic Panel:  ?Lab Results  ?Component Va

## 2021-11-01 NOTE — ED Provider Notes (Signed)
I received the patient in signout by Dr. Tomi Bamberger.  Briefly the patient is a 66 year old male with right-sided headache and left hand tingling.  Was recently admitted a couple weeks ago for an acute stroke.  Plan for MRI of the brain and rediscussed with neurology. ? ?MRI images are up and on my view he looks like he has multiple small new strokes.  I discussed this with neurology who reviewed the images and concurred.  I received a call from the radiologist who also discussed the findings with me.  Dr. Lorrin Goodell, neurology to see the patient and recommended medical admission. ?  Deno Etienne, DO ?11/01/21 0124 ? ?

## 2021-11-01 NOTE — ED Notes (Signed)
Wife updated on pt. Status  ?

## 2021-11-01 NOTE — Evaluation (Signed)
Occupational Therapy Evaluation ?Patient Details ?Name: Jerry MillingBarry D [name] ?MRN: [id number] ?DOB: 02-Sep-1955 ?Today's Date: 11/01/2021 ? ? ?History of Present Illness Pt is a 66 y/o male [facility] worsening L LE weakness, ataxia and HA x 3 days.   MRI brain revealed acute/subacute nonhemorrhagic infarct involving the right caudate head, right parietal lobe, and right frontal lobe.  Posterior parietal infarcts may represent watershed infarct.  Remote nonhemorrhagic lacunar infarcts of the right lentiform nucleus and right thalamus.  PMHx:  DM, Dyslipidemia, HTN, Cath  ? ?Clinical Impression ?  ?Pt admitted for concerns listed above. PTA pt reported that he was independent with all ADL's and IADL's, using no AD for mobility. At this time, pt is near his baseline, no difficulties or balance concerns with OOB mobility. Able to complete all BADL's with no safety concerns. Cognition and vision appear WFL. Pt has no further OT needs and acute OT will sign off.   ?   ? ?Recommendations for follow up therapy are one component of a multi-disciplinary discharge planning process, led by the attending physician.  Recommendations may be updated based on patient status, additional functional criteria and insurance authorization.  ? ?Follow Up Recommendations ? No OT follow up  ?  ?Assistance Recommended at Discharge None  ?Patient can return home with the following Assistance with cooking/housework ? ?  ?Functional Status Assessment ? Patient has had a recent decline in their functional status and demonstrates the ability to make significant improvements in function in a reasonable and predictable amount of time.  ?Equipment Recommendations ? None recommended by OT  ?  ?Recommendations for Other Services   ? ? ?  ?Precautions / Restrictions Precautions ?Precautions: Fall ?Restrictions ?Weight Bearing Restrictions: No  ? ?  ? ?Mobility Bed Mobility ?Overal bed mobility: Modified Independent ?  ?  ?  ?  ?  ?  ?General  bed mobility comments: from ED stretcher, no difficulties ?  ? ?Transfers ?Overall transfer level: Modified independent ?Equipment used: None ?  ?  ?  ?  ?  ?  ?  ?  ?  ? ?  ?Balance Overall balance assessment: Modified Independent ?  ?  ?  ?  ?  ?  ?  ?  ?  ?  ?  ?  ?  ?  ?  ?  ?  ?  ?   ? ?ADL either performed or assessed with clinical judgement  ? ?ADL Overall ADL's : Independent;At baseline ?  ?  ?  ?  ?  ?  ?  ?  ?  ?  ?  ?  ?  ?  ?  ?  ?  ?  ?  ?General ADL Comments: Pt near his baseline, increased time to steady, no assist needed.  ? ? ? ?Vision Baseline Vision/History: 0 No visual deficits ?Ability to See in Adequate Light: 0 Adequate ?Patient Visual Report: No change from baseline ?Vision Assessment?: No apparent visual deficits  ?   ?Perception   ?  ?Praxis   ?  ? ?Pertinent Vitals/Pain Pain Assessment ?Pain Assessment: No/denies pain  ? ? ? ?Hand Dominance Right ?  ?Extremity/Trunk Assessment Upper Extremity Assessment ?Upper Extremity Assessment: Overall WFL for tasks assessed ?  ?Lower Extremity Assessment ?Lower Extremity Assessment: Overall WFL for tasks assessed ?  ?Cervical / Trunk Assessment ?Cervical / Trunk Assessment: Normal ?  ?Communication Communication ?Communication: No difficulties ?  ?Cognition Arousal/Alertness: Awake/alert ?Behavior During Therapy: [facility] for tasks assessed/performed ?  Overall Cognitive Status: Within Functional Limits for tasks assessed ?  ?  ?  ?  ?  ?  ?  ?  ?  ?  ?  ?  ?  ?  ?  ?  ?  ?  ?  ?General Comments  VSS on RA ? ?  ?Exercises   ?  ?Shoulder Instructions    ? ? ?Home Living Family/patient expects to be discharged to:: Private residence ?Living Arrangements: Spouse/significant other ?Available Help at Discharge: Family;Available 24 hours/day ?Type of Home: House ?Home Access: Stairs to enter ?Entrance Stairs-Number of Steps: 17 steps ?Entrance Stairs-Rails: Right ?Home Layout: Two level;Bed/bath upstairs ?Alternate Level Stairs-Number of Steps: flight ?Alternate  Level Stairs-Rails: Right;Left ?Bathroom Shower/Tub: Walk-in shower ?  ?Bathroom Toilet: Handicapped height ?  ?  ?Home Equipment: None ?  ?  ?  ? ?  ?Prior Functioning/Environment Prior Level of Function : Independent/Modified Independent ?  ?  ?  ?  ?  ?  ?  ?  ?  ? ?  ?  ?OT Problem List: Decreased strength;Decreased activity tolerance;Impaired balance (sitting and/or standing) ?  ?   ?OT Treatment/Interventions:    ?  ?OT Goals(Current goals can be found in the care plan section) Acute Rehab OT Goals ?Patient Stated Goal: To go home ?OT Goal Formulation: All assessment and education complete, DC therapy ?Time For Goal Achievement: 11/01/21 ?Potential to Achieve Goals: Good  ?OT Frequency:   ?  ? ?Co-evaluation   ?  ?  ?  ?  ? ?  ?AM-PAC OT "6 Clicks" Daily Activity     ?Outcome Measure Help from another person eating meals?: None ?Help from another person taking care of personal grooming?: None ?Help from another person toileting, which includes using toliet, bedpan, or urinal?: None ?Help from another person bathing (including washing, rinsing, drying)?: None ?Help from another person to put on and taking off regular upper body clothing?: None ?Help from another person to put on and taking off regular lower body clothing?: None ?6 Click Score: 24 ?  ?End of Session Nurse Communication: Mobility status ? ?Activity Tolerance: Patient tolerated treatment well ?Patient left: in bed;with call bell/phone within reach ? ?OT Visit Diagnosis: Unsteadiness on feet (R26.81);Other abnormalities of gait and mobility (R26.89);Muscle weakness (generalized) (M62.81)  ?              ?Time: 0938-1829 ?OT Time Calculation (min): 25 min ?Charges:  OT General Charges ?$OT Visit: 1 Visit ?OT Evaluation ?$OT Eval Moderate Complexity: 1 Mod ?OT Treatments ?$Self Care/Home Management : 8-22 mins ? ?Jerry [name]., OTR/L ?Acute Rehabilitation ? ?Jerry [name] ?11/01/2021, 1:46 PM ?

## 2021-11-01 NOTE — Progress Notes (Signed)
?PROGRESS NOTE ? ? ? ?Jerry [name]  VOZ:[id number] DOB: 1956/02/25 DOA: 10/31/2021 ?PCP: [name], [name], PA  ? ? ?Chief Complaint  ?Patient presents with  ? Headache  ?  Numbness/Tingling left hand  ? ? ?Brief Narrative:  ?Patient pleasant 66 year old gentleman with history of recent admission for stroke discharged on October 22, 2021 with a loop recorder placed at time presented to the ED with tingling and numbness of the left side of the face and left hand around 4:30 PM on day of admission.  Patient denied any weakness or visual difficulties or difficulties with speech or dysphagia.  Patient seen in the ED MRI brain done concerning for possible embolic CVA involving the right cerebral hemisphere.  Patient seen in consultation by neurology. ? ? ?Assessment & Plan: ?  ?Principal Problem: ?  Acute CVA (cerebrovascular accident) (HCC) ?Active Problems: ?  Type 2 diabetes mellitus without complication (HCC) ?  Hyperlipidemia ?  Essential hypertension, benign ?  Hypomagnesemia ?  Hypokalemia ?  Hyponatremia ? ?#1 acute CVA ?-Patient presented with left facial numbness and tingling as well as numbness and tingling in the left hand with left hand weakness. ?-MRI brain done concerning for possible embolic stroke involving the right cerebral hemisphere. ?-Patient noted to have loop recorder placed during last hospitalization which was interrogated on 10/31/2021 which was negative for A-fib so far. ?-CT angiogram head and neck with extensive bilateral ICA siphon to moderate plaque with right supraclinoid and moderate severe left cavernous stenosis.  Moderate stenosis of the left MCA anterior division M2.  Stable moderate stenosis of dominant distal left vertebral artery.  Left PCA P2 and right PCA P3 moderate to severe stenosis. ?-LDL at 88. ?-Hemoglobin A1c 6.2. ?-Patient underwent transcranial Dopplers which was negative for bubble study. ?-Patient for TEE 11/02/2021. ?-Patient noted to be on aspirin and Plavix prior to  admission. ?-Patient seen by neurology, stroke team recommending patient on aspirin 81 mg daily and Brilinta 90 mg twice daily. ?-Permissive hypertension and as such we will hold antihypertensive medications. ?-Blood pressure soft this morning we will give a 500 cc bolus of IV fluids. ?-Patient noted to have declined physical therapy. ?-Continue OT. ?-Neurology following and appreciate their input and recommendations. ? ?2.  Hypertension ?-Blood pressure soft this morning. ?-Permissive hypertension secondary to problem #1. ?-Hold antihypertensive medications. ?-Normal saline 500 cc bolus x1. ?-Normal saline 100 cc an hour. ? ?3.  Hyperlipidemia ?-LDL of 88, goal LDL < 70. ?-Continue home regimen Crestor 40 mg daily. ? ?4.  Type 2 diabetes mellitus ?-Hemoglobin A1c 6.2.(10/21/2021) ?-Hold oral hypoglycemic agents. ?-CBG 147 this morning. ?-Patient for probable TEE tomorrow and as such is going to be n.p.o. after midnight. ?-Decrease home dose long-acting insulin of Semglee to 6 units this evening. ?-SSI. ? ?5.  Hypokalemia/hypomagnesemia ?-Magnesium at 1.5.  Potassium at 3.2. ?-K-Dur 40 mEq p.o. every 4 hours x2 doses. ?-Magnesium sulfate 4 g IV x1 ?-Repeat labs in the AM. ? ?6.  Hyponatremia ?-Likely secondary to hypovolemic hyponatremia in the setting of HCTZ. ?-Patient noted to have soft blood pressure this morning ?-IV fluids. ?-HCTZ on hold and likely will not resume on discharge. ?-Repeat labs in the AM. ? ? ?DVT prophylaxis: SCDs ?Code Status: Full ?Family Communication: Updated patient.  No family at bedside ?Disposition:  ? ?Status is: Inpatient ?The patient will require care spanning > 2 midnights and should be moved to inpatient because: Severity of illness ?  ?Consultants:  ?Neurology: Dr. Derry LoryKhaliqdina 11/01/2021 ? ?Procedures:  ?  CT angiogram head and neck 11/01/2021 ?Chest x-ray 11/01/2021 ?MRI brain 10/31/2021 ?Transcranial Dopplers 11/01/2021 ? ? ?Antimicrobials:  ?None ? ? ?Subjective: ?Asleep on gurney in  the ED.  Easily arousable.  No chest pain.  No shortness of breath.  No abdominal pain.  Feels numbness on the left face has improved.  Still with left hand numbness and weakness. ? ?Objective: ?Vitals:  ? 11/01/21 1300 11/01/21 1330 11/01/21 1720 11/01/21 1958  ?BP: (!) 140/91 (!) 146/82 133/89 (!) 128/94  ?Pulse: 95 80 77 90  ?Resp: 14  16 18   ?Temp:    98.4 ?F (36.9 ?C)  ?TempSrc:      ?SpO2: 100% 99% 99% 99%  ?Weight:      ?Height:      ? ? ?Intake/Output Summary (Last 24 hours) at 11/01/2021 2006 ?Last data filed at 11/01/2021 1138 ?Gross per 24 hour  ?Intake 2475 ml  ?Output --  ?Net 2475 ml  ? ?Filed Weights  ? 10/31/21 2052  ?Weight: 120.2 kg  ? ? ?Examination: ? ?General exam: Appears calm and comfortable  ?Respiratory system: Clear to auscultation. Respiratory effort normal. ?Cardiovascular system: S1 & S2 heard, RRR. No JVD, murmurs, rubs, gallops or clicks. No pedal edema. ?Gastrointestinal system: Abdomen is nondistended, soft and nontender. No organomegaly or masses felt. Normal bowel sounds heard. ?Central nervous system: Alert and oriented.  Improvement with left facial numbness.  Still with right hand weakness.  Otherwise rest of cranial nerves are within normal limits.  Sensation intact.  Gait not tested secondary to safety.  ?Extremities: Symmetric 5 x 5 power. ?Skin: No rashes, lesions or ulcers ?Psychiatry: Judgement and insight appear normal. Mood & affect appropriate.  ? ? ? ?Data Reviewed: I have personally reviewed following labs and imaging studies ? ?CBC: ?Recent Labs  ?Lab 10/31/21 ?2122 11/01/21 ?0431  ?WBC 7.4 8.3  ?NEUTROABS 4.1  --   ?HGB 14.2 11.8*  ?HCT 38.5* 31.8*  ?MCV 93.4 93.3  ?PLT 262 272  ? ? ?Basic Metabolic Panel: ?Recent Labs  ?Lab 10/31/21 ?2122 11/01/21 ?0431 11/01/21 ?0857  ?NA 129* 129*  --   ?K 3.5 3.2*  --   ?CL 96* 96*  --   ?CO2 23 22  --   ?GLUCOSE 172* 162*  --   ?BUN 20 20  --   ?CREATININE 1.52* 1.37*  --   ?CALCIUM 9.5 9.3  --   ?MG  --   --  1.5*   ? ? ?GFR: ?Estimated Creatinine Clearance: 75.2 mL/min (A) (by C-G formula based on SCr of 1.37 mg/dL (H)). ? ?Liver Function Tests: ?Recent Labs  ?Lab 10/31/21 ?2122 11/01/21 ?0431  ?AST 24 21  ?ALT 20 17  ?ALKPHOS 45 45  ?BILITOT 1.9* 1.6*  ?PROT 7.1 6.7  ?ALBUMIN 4.0 3.8  ? ? ?CBG: ?Recent Labs  ?Lab 11/01/21 ?0721 11/01/21 ?1237 11/01/21 ?1716  ?GLUCAP 147* 122* 148*  ? ? ? ?No results found for this or any previous visit (from the past 240 hour(s)).  ? ? ? ? ? ?Radiology Studies: ?CT ANGIO HEAD NECK W WO CM ? ?Result Date: 11/01/2021 ?CLINICAL DATA:  66 year old male with headache and left side symptoms. Scattered small acute infarcts throughout the right cerebral hemisphere on MRI yesterday. EXAM: CT ANGIOGRAPHY HEAD AND NECK TECHNIQUE: Multidetector CT imaging of the head and neck was performed using the standard protocol during bolus administration of intravenous contrast. Multiplanar CT image reconstructions and MIPs were obtained to evaluate the vascular anatomy. Carotid stenosis measurements (  when applicable) are obtained utilizing NASCET criteria, using the distal internal carotid diameter as the denominator. RADIATION DOSE REDUCTION: This exam was performed according to the departmental dose-optimization program which includes automated exposure control, adjustment of the mA and/or kV according to patient size and/or use of iterative reconstruction technique. CONTRAST:  75mL OMNIPAQUE IOHEXOL 350 MG/ML SOLN COMPARISON:  Brain MRI 10/31/2021. CTA head and neck 10/20/2021 FINDINGS: CT HEAD Brain: No midline shift, mass effect, or evidence of intracranial mass lesion. No ventriculomegaly. No acute intracranial hemorrhage identified. Occasional small hypodense areas in the right hemisphere corresponding to the abnormal diffusion yesterday. And scattered small calcific foci in the hemisphere suggesting embolized plaque (series 10, image 22). But otherwise stable gray-white matter differentiation from the CT  earlier this month. Calvarium and skull base: No acute osseous abnormality identified. Paranasal sinuses: Mild left mastoid effusion is stable. Other Visualized paranasal sinuses and mastoids are stable and well aerated

## 2021-11-01 NOTE — H&P (Signed)
?History and Physical  ? ? ?Jerry [name] [id number] DOB: 01-18-1956 DOA: 10/31/2021 ? ?PCP: Pieter Partridge, PA  ?Patient coming from: Home. ? ?Chief Complaint: Left facial and hand numbness. ? ?HPI: Jerry [name] is a 66 y.o. male with history of recent admission for stroke discharged on October 22, 2021 has had a loop recorder placed at the time presents to the ER after patient started experiencing tingling and numbness of the left side of the face and left hand at around 4:30 PM on October 31, 2021.  Denies any weakness of the extremities or any visual symptoms difficulty speaking or swallowing.  Patient states his primary care physician noticed some arrhythmias stable in office. ? ?ED Course: In the ER MRI brain shows features concerning for possible embolic stroke involving the right cerebral hemisphere.  On-call neurologist Dr. Gwyneth Sprout was consulted.  Patient admitted for further work-up.  Labs show acute renal failure with creatinine of 1.5 which has worsened from 1.210 days ago and sodium of 129. ? ?Review of Systems: As per HPI, rest all negative. ? ? ?Past Medical History:  ?Diagnosis Date  ? Arthritis   ? Diabetes mellitus 2008  ? type II  ? Dyslipidemia   ? Erectile dysfunction   ? History of cardiac catheterization 06/2013  ? Dr. Peter Martinique  ? HTN (hypertension)   ? Hypogonadism male   ? ? ?Past Surgical History:  ?Procedure Laterality Date  ? APPENDECTOMY    ? COLONOSCOPY  08/2006  ? Dr. Fuller Plan - repeat 2018  ? INCISION AND DRAINAGE PERIRECTAL ABSCESS    ? KNEE ARTHROSCOPY Left 12/28/2014  ? Procedure: ARTHROSCOPY LEFT KNEE WITH  MEDIAL MENSICUS DEBRIDEMENT;  Surgeon: Gaynelle Arabian, MD;  Location: [facility];  Service: Orthopedics;  Laterality: Left;  ? KNEE SURGERY    ? left knee - remote past  ? LEFT HEART CATHETERIZATION WITH CORONARY ANGIOGRAM N/A 06/11/2013  ? Procedure: LEFT HEART CATHETERIZATION WITH CORONARY ANGIOGRAM;  Surgeon: Peter M Martinique, MD;  Location[facility] CATH LAB;  Service: Cardiovascular;   Laterality: N/A;  ? LOOP RECORDER INSERTION N/A 10/22/2021  ? Procedure: LOOP RECORDER INSERTION;  Surgeon: Deboraha Sprang, MD;  Location: [facility];  Service: Cardiovascular;  Laterality: N/A;  ? right arm and elbow surgery    ? TONSILLECTOMY    ? TOTAL KNEE ARTHROPLASTY Left 10/04/2015  ? Procedure: LEFT TOTAL KNEE ARTHROPLASTY right knee aspiration and injection;  Surgeon: Gaynelle Arabian, MD;  Location: [facility];  Service: Orthopedics;  Laterality: Left;  ? TOTAL KNEE ARTHROPLASTY Right 11/04/2016  ? Procedure: RIGHT TOTAL KNEE ARTHROPLASTY;  Surgeon: Gaynelle Arabian, MD;  Location: [facility];  Service: Orthopedics;  Laterality: Right;  with abductor block  ? wisdom teeth extractions    ? ? ? reports that he quit smoking about 19 years ago. His smoking use included cigarettes. He has never used smokeless tobacco. He reports that he does not drink alcohol and does not use drugs. ? ?Allergies  ?Allergen Reactions  ? Diclofenac Sodium Other (See Comments)  ?  Gel caused a burning sensation   ? Lisinopril Cough  ? Losartan Cough  ?  Unknown bad side effects ?Unknown bad side effects  ? Voltaren [Diclofenac] Other (See Comments)  ?  Gel caused a burning sensation   ? ? ?Family History  ?Problem Relation Age of Onset  ? Diabetes Mother   ? Other Father   ?     death by MVA  ? Other  Brother   ?     died of MVA  ? Stroke Neg Hx   ? Heart disease Neg Hx   ? Cancer Neg Hx   ? Hypertension Neg Hx   ? ? ?Prior to Admission medications   ?Medication Sig Start Date End Date Taking? Authorizing Provider  ?acetaminophen (TYLENOL) 500 MG tablet Take 1,000 mg by mouth every 6 (six) hours as needed for mild pain.   Yes [provider]  ?amLODipine (NORVASC) 10 MG tablet Take 1 tablet (10 mg total) by mouth daily. 10/22/21 01/20/22 Yes [place] Donnamarie Poag, DO  ?aspirin EC 81 MG tablet Take 1 tablet (81 mg total) by mouth daily. Swallow whole. 10/22/21 01/20/22 Yes [place] Donnamarie Poag, DO  ?clopidogrel (PLAVIX) 75 MG tablet Take 1  tablet (75 mg total) by mouth daily for 21 days. 10/23/21 11/13/21 Yes [place] Donnamarie Poag, DO  ?docusate sodium (COLACE) 100 MG capsule Take 1 capsule (100 mg total) by mouth every 12 (twelve) hours. ?Patient taking differently: Take 100 mg by mouth 2 (two) times daily as needed for moderate constipation. 08/13/21  Yes Antonietta Breach, PA-C  ?glucose blood test strip Please check blood sugar 5 times per day due to fluctuating blood sugars. 05/31/16  Yes [provider]  ?hydrochlorothiazide (HYDRODIURIL) 25 MG tablet Take 1 tablet (25 mg total) by mouth daily. 10/22/21 01/20/22 Yes [place] Donnamarie Poag, DO  ?ibuprofen (ADVIL) 200 MG tablet Take 400 mg by mouth every 6 (six) hours as needed for headache or moderate pain.   Yes [provider]  ?melatonin 5 MG TABS Take 5 mg by mouth at bedtime as needed (sleep).   Yes [provider]  ?metFORMIN (GLUCOPHAGE) 1000 MG tablet Take 1 tablet (1,000 mg total) by mouth daily. ?Patient taking differently: Take 1,000 mg by mouth 2 (two) times daily with a meal. 12/28/14  Yes Aluisio, Pilar Plate, MD  ?Multiple Vitamins-Minerals (CENTRUM SILVER 50+MEN) TABS Take 1 tablet by mouth daily.   Yes [provider]  ?rosuvastatin (CRESTOR) 40 MG tablet Take 1 tablet (40 mg total) by mouth daily. 10/23/21 01/21/22 Yes [place] Donnamarie Poag, DO  ?sertraline (ZOLOFT) 50 MG tablet Take 50 mg by mouth daily. 10/11/21  Yes [provider]  ?Blood Glucose Monitoring Suppl (GLUCOCOM BLOOD GLUCOSE MONITOR) DEVI Please check blood sugar 5 times per day due to fluctuating blood sugars. 05/31/16   [provider]  ?Insulin Glargine (LANTUS SOLOSTAR) 100 UNIT/ML Solostar Pen Inject 15 Units into the skin 2 (two) times daily. ?Patient taking differently: Inject 15 Units into the skin at bedtime. 12/28/14   Gaynelle Arabian, MD  ? ? ?Physical Exam: ?Constitutional: Moderately built and nourished. ?Vitals:  ? 10/31/21 2047 10/31/21 2052 11/01/21 0016  ?BP: 127/78  (!) 152/92  ?Pulse:  (!) 101  89  ?Resp: 16  16  ?Temp: 98.3 ?F (36.8 ?C)  98.3 ?F (36.8 ?C)  ?TempSrc:   Oral  ?SpO2: 98%  97%  ?Weight:  120.2 kg   ?Height:  6\' 4"  (1.93 m)   ? ?Eyes: Anicteric no pallor. ?ENMT: No discharge from the ears eyes nose and mouth. ?Neck: No mass felt.  No neck rigidity. ?Respiratory: No rhonchi or crepitations. ?Cardiovascular: S1-S2 heard. ?Abdomen: Soft nontender bowel sound present. ?Musculoskeletal: No edema. ?Skin: No rash. ?Neurologic: Alert awake oriented time place and person.  Moves all extremities 5 x 5.  No facial asymmetry tongue is midline pupils are equal and reacting to light. ?Psychiatric: Appears normal.  Normal affect. ? ? ?  Labs on Admission: I have personally reviewed following labs and imaging studies ? ?CBC: ?Recent Labs  ?Lab 10/31/21 ?2122  ?WBC 7.4  ?NEUTROABS 4.1  ?HGB 14.2  ?HCT 38.5*  ?MCV 93.4  ?PLT 262  ? ?Basic Metabolic Panel: ?Recent Labs  ?Lab 10/31/21 ?2122  ?NA 129*  ?K 3.5  ?CL 96*  ?CO2 23  ?GLUCOSE 172*  ?BUN 20  ?CREATININE 1.52*  ?CALCIUM 9.5  ? ?GFR: ?Estimated Creatinine Clearance: 67.8 mL/min (A) (by C-G formula based on SCr of 1.52 mg/dL (H)). ?Liver Function Tests: ?Recent Labs  ?Lab 10/31/21 ?2122  ?AST 24  ?ALT 20  ?ALKPHOS 45  ?BILITOT 1.9*  ?PROT 7.1  ?ALBUMIN 4.0  ? ?No results for input(s): LIPASE, AMYLASE in the last 168 hours. ?No results for input(s): AMMONIA in the last 168 hours. ?Coagulation Profile: ?Recent Labs  ?Lab 10/31/21 ?2122  ?INR 1.1  ? ?Cardiac Enzymes: ?No results for input(s): CKTOTAL, CKMB, CKMBINDEX, TROPONINI in the last 168 hours. ?BNP (last 3 results) ?No results for input(s): PROBNP in the last 8760 hours. ?HbA1C: ?No results for input(s): HGBA1C in the last 72 hours. ?CBG: ?No results for input(s): GLUCAP in the last 168 hours. ?Lipid Profile: ?No results for input(s): CHOL, HDL, LDLCALC, TRIG, CHOLHDL, LDLDIRECT in the last 72 hours. ?Thyroid Function Tests: ?No results for input(s): TSH, T4TOTAL, FREET4, T3FREE, THYROIDAB in the  last 72 hours. ?Anemia Panel: ?No results for input(s): VITAMINB12, FOLATE, FERRITIN, TIBC, IRON, RETICCTPCT in the last 72 hours. ?Urine analysis: ?   ?Component Value Date/Time  ? COLORURINE YELLOW 03

## 2021-11-02 ENCOUNTER — Other Ambulatory Visit (HOSPITAL_COMMUNITY): Payer: Self-pay

## 2021-11-02 ENCOUNTER — Encounter (HOSPITAL_COMMUNITY)
Admission: EM | Disposition: A | Discharge status: Home / Self Care | Payer: Self-pay | Source: Home / Self Care | Attending: Internal Medicine

## 2021-11-02 ENCOUNTER — Encounter (HOSPITAL_COMMUNITY): Payer: Self-pay | Admitting: Internal Medicine

## 2021-11-02 ENCOUNTER — Inpatient Hospital Stay (HOSPITAL_COMMUNITY): Payer: Medicare HMO

## 2021-11-02 ENCOUNTER — Inpatient Hospital Stay (HOSPITAL_COMMUNITY): Payer: Medicare HMO | Admitting: Certified Registered Nurse Anesthetist

## 2021-11-02 DIAGNOSIS — R531 Weakness: Secondary | ICD-10-CM

## 2021-11-02 DIAGNOSIS — Z794 Long term (current) use of insulin: Secondary | ICD-10-CM

## 2021-11-02 DIAGNOSIS — R931 Abnormal findings on diagnostic imaging of heart and coronary circulation: Secondary | ICD-10-CM

## 2021-11-02 DIAGNOSIS — I639 Cerebral infarction, unspecified: Secondary | ICD-10-CM

## 2021-11-02 DIAGNOSIS — I10 Essential (primary) hypertension: Secondary | ICD-10-CM

## 2021-11-02 DIAGNOSIS — E119 Type 2 diabetes mellitus without complications: Secondary | ICD-10-CM

## 2021-11-02 DIAGNOSIS — Z7984 Long term (current) use of oral hypoglycemic drugs: Secondary | ICD-10-CM

## 2021-11-02 DIAGNOSIS — E785 Hyperlipidemia, unspecified: Secondary | ICD-10-CM | POA: Diagnosis not present

## 2021-11-02 HISTORY — PX: TEE WITHOUT CARDIOVERSION: SHX5443

## 2021-11-02 HISTORY — PX: BUBBLE STUDY: SHX6837

## 2021-11-02 LAB — GLUCOSE, CAPILLARY
Glucose-Capillary: 153 mg/dL — ABNORMAL HIGH (ref 70–99)
Glucose-Capillary: 136 mg/dL — ABNORMAL HIGH (ref 70–99)
Glucose-Capillary: 133 mg/dL — ABNORMAL HIGH (ref 70–99)
Glucose-Capillary: 146 mg/dL — ABNORMAL HIGH (ref 70–99)
Glucose-Capillary: 179 mg/dL — ABNORMAL HIGH (ref 70–99)

## 2021-11-02 LAB — CBC WITH DIFFERENTIAL/PLATELET
Abs Immature Granulocytes: 0.01 10*3/uL (ref 0.00–0.07)
Basophils Absolute: 0 10*3/uL (ref 0.0–0.1)
Basophils Relative: 0 %
Eosinophils Absolute: 0.1 10*3/uL (ref 0.0–0.5)
Eosinophils Relative: 2 %
HCT: 34.9 % — ABNORMAL LOW (ref 39.0–52.0)
Hemoglobin: 13.1 g/dL (ref 13.0–17.0)
Immature Granulocytes: 0 %
Lymphocytes Relative: 34 %
Lymphs Abs: 2 10*3/uL (ref 0.7–4.0)
MCH: 35 pg — ABNORMAL HIGH (ref 26.0–34.0)
MCHC: 37.5 g/dL — ABNORMAL HIGH (ref 30.0–36.0)
MCV: 93.3 fL (ref 80.0–100.0)
Monocytes Absolute: 0.7 10*3/uL (ref 0.1–1.0)
Monocytes Relative: 12 %
Neutro Abs: 3 10*3/uL (ref 1.7–7.7)
Neutrophils Relative %: 52 %
Platelets: 231 10*3/uL (ref 150–400)
RBC: 3.74 MIL/uL — ABNORMAL LOW (ref 4.22–5.81)
RDW: 11.3 % — ABNORMAL LOW (ref 11.5–15.5)
WBC: 5.9 10*3/uL (ref 4.0–10.5)
nRBC: 0 % (ref 0.0–0.2)

## 2021-11-02 LAB — RENAL FUNCTION PANEL
Albumin: 3.8 g/dL (ref 3.5–5.0)
Anion gap: 7 (ref 5–15)
BUN: 14 mg/dL (ref 8–23)
CO2: 24 mmol/L (ref 22–32)
Calcium: 9.1 mg/dL (ref 8.9–10.3)
Chloride: 99 mmol/L (ref 98–111)
Creatinine, Ser: 1 mg/dL (ref 0.61–1.24)
GFR, Estimated: >60 mL/min (ref >60)
Glucose, Bld: 170 mg/dL — ABNORMAL HIGH (ref 70–99)
Phosphorus: 2.3 mg/dL — ABNORMAL LOW (ref 2.5–4.6)
Potassium: 3.6 mmol/L (ref 3.5–5.1)
Sodium: 130 mmol/L — ABNORMAL LOW (ref 135–145)

## 2021-11-02 LAB — MAGNESIUM: Magnesium: 2.6 mg/dL — ABNORMAL HIGH (ref 1.7–2.4)

## 2021-11-02 SURGERY — ECHOCARDIOGRAM, TRANSESOPHAGEAL
Anesthesia: Monitor Anesthesia Care

## 2021-11-02 MED ORDER — POTASSIUM CHLORIDE CRYS ER 10 MEQ PO TBCR
40.0000 meq | EXTENDED_RELEASE_TABLET | Freq: Once | ORAL | Status: AC
  Administered 2021-11-02: 40 meq via ORAL
  Filled 2021-11-02: qty 4

## 2021-11-02 MED ORDER — STUDY - OCEANIC-STROKE - ASUNDEXIAN 50 MG OR PLACEBO TABLET (PI-SETHI)
1.0000 | ORAL_TABLET | Freq: Every day | ORAL | 0 refills | Status: DC
Start: 2021-11-02 — End: 2023-04-29

## 2021-11-02 MED ORDER — TICAGRELOR 90 MG PO TABS
90.0000 mg | ORAL_TABLET | Freq: Two times a day (BID) | ORAL | 0 refills | Status: AC
  Filled 2021-11-02: qty 56, 28d supply, fill #0

## 2021-11-02 MED ORDER — PROPOFOL 500 MG/50ML IV EMUL
INTRAVENOUS | Status: DC | PRN
  Administered 2021-11-02: 100 ug/kg/min via INTRAVENOUS

## 2021-11-02 MED ORDER — PROPOFOL 10 MG/ML IV BOLUS
INTRAVENOUS | Status: DC | PRN
Start: 2021-11-02 — End: 2021-11-02
  Administered 2021-11-02 (×2): 10 mg via INTRAVENOUS

## 2021-11-02 MED ORDER — SODIUM CHLORIDE 0.9 % IV SOLN: INTRAVENOUS | Status: DC | PRN

## 2021-11-02 MED ORDER — AMLODIPINE BESYLATE 10 MG PO TABS
10.0000 mg | ORAL_TABLET | Freq: Every day | ORAL | 2 refills | Status: DC
Start: 2021-11-04 — End: 2022-10-24

## 2021-11-02 MED ORDER — K PHOS MONO-SOD PHOS DI & MONO 155-852-130 MG PO TABS
250.0000 mg | ORAL_TABLET | Freq: Two times a day (BID) | ORAL | Status: DC
  Administered 2021-11-02: 250 mg via ORAL
  Filled 2021-11-02 (×2): qty 1

## 2021-11-02 MED ORDER — SODIUM CHLORIDE 0.9 % IV SOLN: INTRAVENOUS | Status: DC

## 2021-11-02 MED ORDER — PHOSPHA 250 NEUTRAL 155-852-130 MG PO TABS
250.0000 mg | ORAL_TABLET | Freq: Two times a day (BID) | ORAL | 0 refills | Status: AC
  Filled 2021-11-02: qty 6, 3d supply, fill #0

## 2021-11-02 NOTE — Progress Notes (Signed)
Echocardiogram ?Echocardiogram Transesophageal has been performed. ? ?Warren Lacy Ellanor [name] RDCS ?11/02/2021, 12:12 PM ?

## 2021-11-02 NOTE — Discharge Summary (Signed)
Physician Discharge Summary  ?Jerry [name] [id number] DOB: January 24, 1956 DOA: 10/31/2021 ? ?PCP: Pieter Partridge, PA ? ?Admit date: 10/31/2021 ?Discharge date: 11/02/2021 ? ?Time spent: 60 minutes ? ?Recommendations for Outpatient Follow-up:  ?Follow-up with Dr. Leonie Man, neurology in 4 weeks. ?Follow-up with Pieter Partridge, PA in 2 weeks.  On follow-up patient blood pressure need to be reassessed.  Patient will need risk factor modification.  Patient will need a basic metabolic profile, magnesium level done to follow-up on electrolytes and renal function.  Patient's HCTZ was discontinued due to hyponatremia. ? ? ?Discharge Diagnoses:  ?Principal Problem: ?  Acute CVA (cerebrovascular accident) ([place]) ?Active Problems: ?  Type 2 diabetes mellitus without complication ([place]) ?  Hyperlipidemia ?  Essential hypertension, benign ?  Hypomagnesemia ?  Hypokalemia ?  Hyponatremia ?  Weakness ? ? ?Discharge Condition: Stable and improved. ? ?Diet recommendation: Heart healthy ? ?Filed Weights  ? 10/31/21 2052 11/02/21 1119  ?Weight: 120.2 kg 120 kg  ? ? ?History of present illness:  ?HPI per Dr. Hal Hope ?Jerry [name] is a 66 y.o. male with history of recent admission for stroke discharged on October 22, 2021 has had a loop recorder placed at the time presents to the ER after patient started experiencing tingling and numbness of the left side of the face and left hand at around 4:30 PM on October 31, 2021.  Denied any weakness of the extremities or any visual symptoms difficulty speaking or swallowing.  Patient states his primary care physician noticed some arrhythmias stable in office. ?  ?ED Course: In the ER MRI brain showed features concerning for possible embolic stroke involving the right cerebral hemisphere.  On-call neurologist Dr.Khaliqdina was consulted.  Patient admitted for further work-up.  Labs showed acute renal failure with creatinine of 1.5 which has worsened from 1.2  10 days ago and sodium of 129. ? ?Hospital  Course:  ?#1 acute CVA ?-Patient presented with left facial numbness and tingling as well as numbness and tingling in the left hand with left hand weakness. ?-MRI brain done concerning for possible embolic stroke involving the right cerebral hemisphere. ?-Patient noted to have loop recorder placed during last hospitalization which was interrogated on 10/31/2021 which was negative for A-fib so far. ?-CT angiogram head and neck with extensive bilateral ICA siphon to moderate plaque with right supraclinoid and moderate severe left cavernous stenosis.  Moderate stenosis of the left MCA anterior division M2.  Stable moderate stenosis of dominant distal left vertebral artery.  Left PCA P2 and right PCA P3 moderate to severe stenosis. ?-LDL at 88. ?-Hemoglobin A1c 6.2. ?-Patient underwent transcranial Dopplers which was negative for bubble study. ?-Patient subsequently underwent TEE on 11/02/2021 which showed possible very small PFO, normal LV/RV function, no LAA thrombus, grade IV atheroma noted.   ?-Patient noted to be on aspirin and Plavix prior to admission. ?-Patient seen by neurology, stroke team recommending patient on aspirin 81 mg daily and Brilinta 90 mg twice daily (4 weeks), then subsequently aspirin alone daily.. ?-Permissive hypertension during the hospitalization and antihypertensive medications held. ?-Aggressive risk factor modification recommended with smoking cessation, statin, dual antiplatelet therapy, blood pressure management. ?-Neurology discussed with patient on participation in the [facility] stroke prevention study (aspirin plus Plavix plus factor XI inhibitor Asundexian vs aspirin plus plavix plus placebo) which patient agreed to participate in. ?-Patient noted to have declined physical therapy. ?-Patient seen by OT with no needs detected. ?-Patient improved clinically and will be discharged home in stable  and improved condition with outpatient follow-up with neurology. ? ?2.   Hypertension ?-Blood pressure soft on admission and as such patient's antihypertensive medications were held.  ?-Permissive hypertension secondary to problem #1. ?-Patient placed on IV fluids. ?-HCTZ will be discontinued on discharge. ?-Patient to resume home regimen Norvasc 2 days post discharge. ?-Outpatient follow-up with PCP. ? ?3.  Hyperlipidemia ?-LDL of 88, goal LDL < 70. ?-Patient maintained on home regimen Crestor 40 mg daily.  -Outpatient follow-up with PCP. ? ?4.  Type 2 diabetes mellitus ?-Hemoglobin A1c 6.2.(10/21/2021) ?-Held oral hypoglycemic agents during the hospitalization. ?-As patient was kept n.p.o. the night prior to TEE patient's long-acting insulin was decreased to 6 units and patient maintained on sliding scale insulin.   ?-Patient will be discharged back on home regimen of hypoglycemic agents.   ?-Outpatient follow-up with PCP. ? ?5.  Hypokalemia/hypomagnesemia ?-Repleted during the hospitalization.   ?-Outpatient follow-up with PCP.   ? ?6.  Hyponatremia ?-Likely secondary to hypovolemic hyponatremia in the setting of HCTZ. ?-Patient noted to have soft blood pressure on admission and as such antihypertensive medications held.   ?-HCTZ discontinued.   ?-Patient placed on IV fluids with improvement with sodium levels such that sodium was 130 by day of discharge.   ?-HCTZ will not be resumed on discharge.   ?-Outpatient follow-up with PCP. ?  ? ?Procedures: ?CT angiogram head and neck 11/01/2021 ?Chest x-ray 11/01/2021 ?MRI brain 10/31/2021 ?Transcranial Dopplers 11/01/2021 ?TEE 11/02/2021 ?  ? ?Consultations: ?Neurology: Dr. Lorrin Goodell 11/01/2021 ? ?Discharge Exam: ?Vitals:  ? 11/02/21 1214 11/02/21 1223  ?BP: 111/68 128/89  ?Pulse: 88 88  ?Resp: 13 18  ?Temp:    ?SpO2: 98% 96%  ? ? ?General: NAD ?Cardiovascular: Regular rate rhythm no murmurs rubs or gallops.  No JVD.  No lower extremity edema. ?Respiratory: Clear to auscultation bilaterally.  No wheezes, no crackles, no rhonchi.  Fair air  movement.  Speaking in full sentences. ? ?Discharge Instructions ? ? ?Discharge Instructions   ? ? Ambulatory referral to Neurology   Complete by: As directed ?  ? An appointment is requested in approximately: 4 weeks  ? Diet - low sodium heart healthy   Complete by: As directed ?  ? Increase activity slowly   Complete by: As directed ?  ? ?  ? ?Allergies as of 11/02/2021   ? ?   Reactions  ? Diclofenac Sodium Other (See Comments)  ? Gel caused a burning sensation   ? Lisinopril Cough  ? Losartan Cough  ? Unknown bad side effects ?Unknown bad side effects  ? Voltaren [diclofenac] Other (See Comments)  ? Gel caused a burning sensation   ? ?  ? ?  ?Medication List  ?  ? ?STOP taking these medications   ? ?clopidogrel 75 MG tablet ?Commonly known as: PLAVIX ?  ?hydrochlorothiazide 25 MG tablet ?Commonly known as: HYDRODIURIL ?  ? ?  ? ?TAKE these medications   ? ?acetaminophen 500 MG tablet ?Commonly known as: TYLENOL ?Take 1,000 mg by mouth every 6 (six) hours as needed for mild pain. ?  ?amLODipine 10 MG tablet ?Commonly known as: NORVASC ?Take 1 tablet (10 mg total) by mouth daily. ?Start taking on: November 04, 2021 ?What changed: These instructions start on November 04, 2021. If you are unsure what to do until then, ask your doctor or other care provider. ?  ?aspirin EC 81 MG tablet ?Take 1 tablet (81 mg total) by mouth daily. Swallow whole. ?  ?Centrum Silver 50+Men  Tabs ?Take 1 tablet by mouth daily. ?  ?docusate sodium 100 MG capsule ?Commonly known as: COLACE ?Take 1 capsule (100 mg total) by mouth every 12 (twelve) hours. ?What changed:  ?when to take this ?reasons to take this ?  ?GlucoCom Blood Glucose Monitor Devi ?Please check blood sugar 5 times per day due to fluctuating blood sugars. ?  ?glucose blood test strip ?Please check blood sugar 5 times per day due to fluctuating blood sugars. ?  ?ibuprofen 200 MG tablet ?Commonly known as: ADVIL ?Take 400 mg by mouth every 6 (six) hours as needed for headache or  moderate pain. ?  ?insulin glargine 100 UNIT/ML Solostar Pen ?Commonly known as: Lantus SoloStar ?Inject 15 Units into the skin 2 (two) times daily. ?What changed: when to take this ?  ?melatonin 5 MG Tabs ?Take 5 mg by mouth

## 2021-11-02 NOTE — CV Procedure (Addendum)
INDICATIONS: ?Stroke ? ?PROCEDURE:  ? ?Informed consent was obtained prior to the procedure. The risks, benefits and alternatives for the procedure were discussed and the patient comprehended these risks.  Risks include, but are not limited to, cough, sore throat, vomiting, nausea, somnolence, esophageal and stomach trauma or perforation, bleeding, low blood pressure, aspiration, pneumonia, infection, trauma to the teeth and death.   ? ?After a procedural time-out, the oropharynx was anesthetized with 20% benzocaine spray.  ? ?During this procedure the patient was administered propofol to achieve and maintain moderate conscious sedation.  The patient's heart rate, blood pressure, and oxygen saturationweare monitored continuously during the procedure. The period of conscious sedation was 20 minutes, of which I was present face-to-face 100% of this time. ? ?The transesophageal probe was inserted in the esophagus and stomach without difficulty and multiple views were obtained.  The patient was kept under observation until the patient left the procedure room.  The patient left the procedure room in stable condition.  ? ?Agitated microbubble saline contrast was administered. ? ?COMPLICATIONS:   ? ?There were no immediate complications. ? ?FINDINGS:  ?1-2 bubbles after 3-4 cardiac cycles, possible very small PFO ?Normal LV /RV function ?No LAA thrombus ?Grade IV atheroma ( possible culprit) ? ?RECOMMENDATIONS:   ? ?Continue DAPT ?Continue statin ?Smoking cessation ? ?Time Spent Directly with the Patient: ? ?20 minutes  ? ?Carolan Clines E ?11/02/2021, 11:52 AM ? ?

## 2021-11-02 NOTE — Anesthesia Postprocedure Evaluation (Signed)
Anesthesia Post Note ? ?Patient: Jerry [name] ? ?Procedure(s) Performed: TRANSESOPHAGEAL ECHOCARDIOGRAM (TEE) ?BUBBLE STUDY ? ?  ? ?Patient location during evaluation: PACU ?Anesthesia Type: MAC ?Level of consciousness: awake and alert ?Pain management: pain level controlled ?Vital Signs Assessment: post-procedure vital signs reviewed and stable ?Respiratory status: spontaneous breathing, nonlabored ventilation and respiratory function stable ?Cardiovascular status: blood pressure returned to baseline ?Postop Assessment: no apparent nausea or vomiting ?Anesthetic complications: no ? ? ?No notable events documented. ? ?Last Vitals:  ?Vitals:  ? 11/02/21 1214 11/02/21 1223  ?BP: 111/68 128/89  ?Pulse: 88 88  ?Resp: 13 18  ?Temp:    ?SpO2: 98% 96%  ?  ?Last Pain:  ?Vitals:  ? 11/02/21 1223  ?TempSrc:   ?PainSc: 0-No pain  ? ? ?  ?  ?  ?  ?  ?  ? ?Marthenia Rolling ? ? ? ? ?

## 2021-11-02 NOTE — Progress Notes (Signed)
Received a new order on pt today. We evaluated pt yesterday and he was independent. Chat texted the MD and no change in patient status so will not re-eval. We will sign off ? ?Ignacia Palma, OTR/L ?Acute Rehab Services ?Pager [phone] ?Office [phone] ? ? ?

## 2021-11-02 NOTE — TOC Benefit Eligibility Note (Signed)
Patient Advocate Encounter ? ?Insurance verification completed.   ? ?The patient is currently admitted and upon discharge could be taking Brilinta 90 mg. ? ?The current 30 day co-pay is, $0.00.  ? ?The patient is insured through [employer] Part D  ? ? ? ?Roland EarlJeffrey Eriko Economos, CPhT ?Pharmacy Patient Advocate Specialist ?[facility] Patient [facility]ocate Team ?Direct Number: [phone]  Fax: [phone] ? ? ? ? ? ?  ?

## 2021-11-02 NOTE — Anesthesia Preprocedure Evaluation (Addendum)
Anesthesia Evaluation  ?Patient identified by MRN, date of birth, ID band ?Patient awake ? ? ? ?Reviewed: ?Allergy & Precautions, NPO status , Patient's Chart, lab work & pertinent test results ? ?History of Anesthesia Complications ?Negative for: history of anesthetic complications ? ?Airway ?Mallampati: II ? ?TM Distance: >3 FB ?Neck ROM: Full ? ? ? Dental ?no notable dental hx. ? ?  ?Pulmonary ?former smoker,  ?  ?Pulmonary exam normal ? ? ? ? ? ? ? Cardiovascular ?hypertension, Pt. on medications ?Normal cardiovascular exam ? ? ?  ?Neuro/Psych ?CVA negative psych ROS  ? GI/Hepatic ?negative GI ROS, Neg liver ROS,   ?Endo/Other  ?negative endocrine ROSdiabetes, Type 2, Oral Hypoglycemic Agents, Insulin Dependent ? Renal/GU ?negative Renal ROS  ?negative genitourinary ?  ?Musculoskeletal ? ?(+) Arthritis ,  ? Abdominal ?  ?Peds ? Hematology ?negative hematology ROS ?(+)   ?Anesthesia Other Findings ?Day of surgery medications reviewed with patient. ? Reproductive/Obstetrics ?negative OB ROS ? ?  ? ? ? ? ? ? ? ? ? ? ? ? ? ?  ?  ? ? ? ? ? ? ? ?Anesthesia Physical ?Anesthesia Plan ? ?ASA: 4 ? ?Anesthesia Plan: MAC  ? ?Post-op Pain Management: Minimal or no pain anticipated  ? ?Induction:  ? ?PONV Risk Score and Plan: Treatment may vary due to age or medical condition and Propofol infusion ? ?Airway Management Planned: Natural Airway and Nasal Cannula ? ?Additional Equipment: None ? ?Intra-op Plan:  ? ?Post-operative Plan:  ? ?Informed Consent: I have reviewed the patients History and Physical, chart, labs and discussed the procedure including the risks, benefits and alternatives for the proposed anesthesia with the patient or authorized representative who has indicated his/her understanding and acceptance.  ? ? ? ? ? ?Plan Discussed with: CRNA ? ?Anesthesia Plan Comments:   ? ? ? ? ? ?Anesthesia Quick Evaluation ? ?

## 2021-11-02 NOTE — Plan of Care (Signed)
?  Problem: Education: ?Goal: Knowledge of disease or condition will improve ?Outcome: Adequate for Discharge ?Goal: Knowledge of secondary prevention will improve (SELECT ALL) ?Outcome: Adequate for Discharge ?Goal: Knowledge of patient specific risk factors will improve (INDIVIDUALIZE FOR PATIENT) ?Outcome: Adequate for Discharge ?  ?Problem: Coping: ?Goal: Will verbalize positive feelings about self ?Outcome: Adequate for Discharge ?Goal: Will identify appropriate support needs ?Outcome: Adequate for Discharge ?  ?Problem: Health Behavior/Discharge Planning: ?Goal: Ability to manage health-related needs will improve ?Outcome: Adequate for Discharge ?  ?Problem: Self-Care: ?Goal: Ability to participate in self-care as condition permits will improve ?Outcome: Adequate for Discharge ?Goal: Verbalization of feelings and concerns over difficulty with self-care will improve ?Outcome: Adequate for Discharge ?  ?Problem: Education: ?Goal: Knowledge of General Education information will improve ?Description: Including pain rating scale, medication(s)/side effects and non-pharmacologic comfort measures ?Outcome: Adequate for Discharge ?  ?Problem: Health Behavior/Discharge Planning: ?Goal: Ability to manage health-related needs will improve ?Outcome: Adequate for Discharge ?  ?Problem: Clinical Measurements: ?Goal: Ability to maintain clinical measurements within normal limits will improve ?Outcome: Adequate for Discharge ?Goal: Will remain free from infection ?Outcome: Adequate for Discharge ?Goal: Diagnostic test results will improve ?Outcome: Adequate for Discharge ?Goal: Respiratory complications will improve ?Outcome: Adequate for Discharge ?Goal: Cardiovascular complication will be avoided ?Outcome: Adequate for Discharge ?  ?Problem: Activity: ?Goal: Risk for activity intolerance will decrease ?Outcome: Adequate for Discharge ?  ?Problem: Nutrition: ?Goal: Adequate nutrition will be maintained ?Outcome: Adequate for  Discharge ?  ?Problem: Coping: ?Goal: Level of anxiety will decrease ?Outcome: Adequate for Discharge ?  ?Problem: Elimination: ?Goal: Will not experience complications related to bowel motility ?Outcome: Adequate for Discharge ?Goal: Will not experience complications related to urinary retention ?Outcome: Adequate for Discharge ?  ?Problem: Pain Managment: ?Goal: General experience of comfort will improve ?Outcome: Adequate for Discharge ?  ?Problem: Safety: ?Goal: Ability to remain free from injury will improve ?Outcome: Adequate for Discharge ?  ?Problem: Skin Integrity: ?Goal: Risk for impaired skin integrity will decrease ?Outcome: Adequate for Discharge ?  ?

## 2021-11-02 NOTE — Plan of Care (Signed)
  Problem: Education: Goal: Knowledge of disease or condition will improve Outcome: Progressing Goal: Knowledge of secondary prevention will improve (SELECT ALL) Outcome: Progressing Goal: Knowledge of patient specific risk factors will improve (INDIVIDUALIZE FOR PATIENT) Outcome: Progressing   Problem: Coping: Goal: Will verbalize positive feelings about self Outcome: Progressing Goal: Will identify appropriate support needs Outcome: Progressing   Problem: Health Behavior/Discharge Planning: Goal: Ability to manage health-related needs will improve Outcome: Progressing   

## 2021-11-02 NOTE — Anesthesia Procedure Notes (Signed)
Procedure Name: [place] ?Date/Time: 11/02/2021 11:35 AM ?Performed by: Harden Mo, CRNA ?Pre-anesthesia Checklist: Patient identified, Emergency Drugs available, Suction available and Patient being monitored ?Patient Re-evaluated:Patient Re-evaluated prior to induction ?Oxygen Delivery Method: Nasal cannula ?Preoxygenation: Pre-oxygenation with 100% oxygen ?Induction Type: IV induction ?Placement Confirmation: positive ETCO2 and breath sounds checked- equal and bilateral ?Dental Injury: Teeth and Oropharynx as per pre-operative assessment  ? ? ? ? ?

## 2021-11-02 NOTE — Interval H&P Note (Signed)
History and Physical Interval Note: ? ?11/02/2021 ?11:21 AM ? ?Jerry [name]  has presented today for surgery, with the diagnosis of STROKE.  The various methods of treatment have been discussed with the patient and family. After consideration of risks, benefits and other options for treatment, the patient has consented to  Procedure(s): ?TRANSESOPHAGEAL ECHOCARDIOGRAM (TEE) (N/A) as a surgical intervention.  The patient's history has been reviewed, patient examined, no change in status, stable for surgery.  I have reviewed the patient's chart and labs.  Questions were answered to the patient's satisfaction.   ? ? ?Carolan Clines E ? ? ?

## 2021-11-02 NOTE — TOC Transition Note (Signed)
Transition of Care (TOC) - CM/SW Discharge Note ? ? ?Patient Details  ?Name: Jerry [name] ?MRN: [id number] ?Date of Birth: 04/12/1956 ? ?Transition of Care (TOC) CM/SW Contact:  ?Kermit Balo, RN ?Phone Number: ?11/02/2021, 4:47 PM ? ? ?Clinical Narrative:    ?Patient discharging home with self care. No f/u per PT/OT and no DME needs.  ?Pt has transportation home.  ?Medications to be delivered to the room per [facility] pharmacy.  ? ? ?Final next level of care: Home/Self Care ?Barriers to Discharge: No Barriers Identified ? ? ?Patient Goals and CMS Choice ?  ?  ?  ? ?Discharge Placement ?  ?           ?  ?  ?  ?  ? ?Discharge Plan and Services ?  ?  ?           ?  ?  ?  ?  ?  ?  ?  ?  ?  ?  ? ?Social Determinants of Health (SDOH) Interventions ?  ? ? ?Readmission Risk Interventions ?   ? View : No data to display.  ?  ?  ?  ? ? ? ? ? ?

## 2021-11-02 NOTE — Transfer of Care (Signed)
Immediate Anesthesia Transfer of Care Note ? ?Patient: Jerry [name] ? ?Procedure(s) Performed: TRANSESOPHAGEAL ECHOCARDIOGRAM (TEE) ?BUBBLE STUDY ? ?Patient Location: Endoscopy Unit ? ?Anesthesia Type:MAC ? ?Level of Consciousness: awake and alert  ? ?Airway & Oxygen Therapy: Patient Spontanous Breathing ? ?Post-op Assessment: Report given to RN, Post -op Vital signs reviewed and stable and Patient moving all extremities X 4 ? ?Post vital signs: Reviewed and stable ? ?Last Vitals:  ?Vitals Value Taken Time  ?BP 97/53 11/02/21 1203  ?Temp    ?Pulse 98 11/02/21 1204  ?Resp 27 11/02/21 1204  ?SpO2 95 % 11/02/21 1204  ?Vitals shown include unvalidated device data. ? ?Last Pain:  ?Vitals:  ? 11/02/21 1119  ?TempSrc: Temporal  ?PainSc: 0-No pain  ?   ? ?Patients Stated Pain Goal: 3 (11/01/21 7564) ? ?Complications: No notable events documented. ?

## 2021-11-02 NOTE — Progress Notes (Signed)
SLP Cancellation Note ? ?Patient Details ?Name: Jerry MillingBarry D [name] ?MRN: [id number] ?DOB: [birth year] ? ? ?Cancelled treatment:       Reason Eval/Treat Not Completed: Patient at procedure or test/unavailable (Pt off unit at this time. SLP will follow up.) ? ?Jerry [name] I. Vear ClockPhillips, MS, CCC-SLP ?Acute Rehabilitation Services ?Office number [phone] ?Pager [phone] ? ?Jerry MartenShanika I [name] ?11/02/2021, 11:27 AM ? ? ?

## 2021-11-02 NOTE — Progress Notes (Addendum)
For STROKE TEAM PROGRESS NOTE  ? ?INTERVAL HISTORY ?Patient is neurologically stable.  Exam unchanged.  Still has mild left hand weakness and numbness.  No new complaints.  Vital signs stable.  He signed consent yesterday for the [employer] stroke prevention study and was randomized. ?Patient went for TEE this morning. Possible small PFO and large atheromatous plaque. TCD yesterday was negative for PFO.  ? ?Vitals:  ? 11/02/21 1119 11/02/21 1203 11/02/21 1214 11/02/21 1223  ?BP: (!) 141/77 (!) 97/53 111/68 128/89  ?Pulse: 86 (!) 101 88 88  ?Resp: 19 (!) 26 13 18   ?Temp: (!) 97.3 ?F (36.3 ?C) (!) 97.5 ?F (36.4 ?C)    ?TempSrc: Temporal Temporal    ?SpO2: 100% 94% 98% 96%  ?Weight: 120 kg     ?Height: 6\' 4"  (1.93 m)     ? ?CBC:  ?Recent Labs  ?Lab 10/31/21 ?2122 11/01/21 ?0431 11/02/21 ?0103  ?WBC 7.4 8.3 5.9  ?NEUTROABS 4.1  --  3.0  ?HGB 14.2 11.8* 13.1  ?HCT 38.5* 31.8* 34.9*  ?MCV 93.4 93.3 93.3  ?PLT 262 272 231  ? ?Basic Metabolic Panel:  ?Recent Labs  ?Lab 11/01/21 ?0431 11/01/21 ?0857 11/02/21 ?0103  ?NA 129*  --  130*  ?K 3.2*  --  3.6  ?CL 96*  --  99  ?CO2 22  --  24  ?GLUCOSE 162*  --  170*  ?BUN 20  --  14  ?CREATININE 1.37*  --  1.00  ?CALCIUM 9.3  --  9.1  ?MG  --  1.5* 2.6*  ?PHOS  --   --  2.3*  ? ?Lipid Panel: No results for input(s): CHOL, TRIG, HDL, CHOLHDL, VLDL, LDLCALC in the last 168 hours. ?HgbA1c: No results for input(s): HGBA1C in the last 168 hours. ?Urine Drug Screen:  ?Recent Labs  ?Lab 11/01/21 ?0736  ?LABOPIA NONE DETECTED  ?COCAINSCRNUR NONE DETECTED  ?LABBENZ NONE DETECTED  ?AMPHETMU NONE DETECTED  ?THCU POSITIVE*  ?LABBARB NONE DETECTED  ?  ?Alcohol Level  ?Recent Labs  ?Lab 10/31/21 ?2122  ?ETH <10  ? ? ?IMAGING past 24 hours ?ECHO TEE ? ?Result Date: 11/02/2021 ?   TRANSESOPHOGEAL ECHO REPORT   Patient Name:   Jerry [name] Date of Exam: 11/02/2021 Medical Rec #:  [id number]       Height:       76.0 in Accession #:    [id number]      Weight:       264.6 lb Date of Birth:   [birth year]       BSA:          2.495 m? Patient Age:    66 years        BP:           129/86 mmHg Patient Gender: M               HR:           112 bpm. Exam Location:  Inpatient Procedure: Transesophageal Echo, 3D Echo, Color Doppler, Cardiac Doppler and            Saline Contrast Bubble Study Indications:     Stroke i63.9  History:         Patient has prior history of Echocardiogram examinations.  Sonographer:     Irving Burton Senior RDCS Referring Phys:  [id number] Cyndi Bender Diagnosing Phys: [place] PROCEDURE: After discussion of the risks and benefits of a TEE, an informed consent was obtained from the patient.  The transesophogeal probe was passed without difficulty through the esophogus of the patient. Local oropharyngeal anesthetic was provided with Cetacaine. Sedation performed by different physician. The patient was monitored while under deep sedation. Anesthestetic sedation was provided intravenously by Anesthesiology:  of Propofol. The patient developed no complications during the procedure. IMPRESSIONS  1. Left ventricular ejection fraction, by estimation, is 70 to 75%. The left ventricle has hyperdynamic function.  2. Right ventricular systolic function is normal. The right ventricular size is normal.  3. No left atrial/left atrial appendage thrombus was detected.  4. The mitral valve is normal in structure. No evidence of mitral valve regurgitation.  5. The aortic valve is normal in structure. Aortic valve regurgitation is not visualized.  6. There is Severe (Grade IV) plaque.  7. 1-2 bubbles at ~ 3-4 cardiac cycles. possible small PFO. Conclusion(s)/Recommendation(s): Has atheromatous plaque that may have contributed to stroke. No LAA thrombus. No valvular vegetations. Possible very small PFO; unlikely the etiology of stroke. FINDINGS  Left Ventricle: Left ventricular ejection fraction, by estimation, is 70 to 75%. The left ventricle has hyperdynamic function. The left ventricular internal cavity size  was normal in size. Right Ventricle: The right ventricular size is normal. No increase in right ventricular wall thickness. Right ventricular systolic function is normal. Left Atrium: Left atrial size was normal in size. No left atrial/left atrial appendage thrombus was detected. Right Atrium: Right atrial size was normal in size. Pericardium: There is no evidence of pericardial effusion. Mitral Valve: The mitral valve is normal in structure. No evidence of mitral valve regurgitation. Tricuspid Valve: The tricuspid valve is normal in structure. Tricuspid valve regurgitation is trivial. Aortic Valve: The aortic valve is normal in structure. Aortic valve regurgitation is not visualized. Pulmonic Valve: The pulmonic valve was normal in structure. Pulmonic valve regurgitation is not visualized. Aorta: The aortic root and ascending aorta are structurally normal, with no evidence of dilitation. There is severe (Grade IV) plaque. IAS/Shunts: 1-2 bubbles at ~ 3-4 cardiac cycles. possible small PFO. Agitated saline contrast was given intravenously to evaluate for intracardiac shunting. Carolan Clines Electronically signed by Carolan Clines Signature Date/Time: 11/02/2021/2:16:27 PM    Final    ? ?PHYSICAL EXAM ? ?Physical Exam  ?Constitutional: Appears well-developed and well-nourished.  ?Psych: Affect appropriate to situation ?Eyes: No scleral injection ?HENT: No OP obstrucion ?MSK: no joint deformities.  ?Cardiovascular: Normal rate and regular rhythm.  ?Respiratory: Effort normal, non-labored breathing ?Skin: WDL ? ?Neuro: ?Mental Status: ?Patient is awake, alert, oriented to person, place, month, year, and situation. ?Patient is able to give a clear and coherent history. ?No signs of aphasia or neglect ?Cranial Nerves: ?II: Visual Fields are full. Pupils are equal, round, and reactive to light.   ?III,IV, VI: EOMI without ptosis or diploplia.  ?V: Facial sensation is symmetric to light touch ?VII: Facial movement is symmetric  resting and smiling ?VIII: Hearing is intact to voice ?X: Palate elevates symmetrically ?XI: Shoulder shrug is symmetric. ?XII: Tongue protrudes midline without atrophy or fasciculations.  ?Motor: ?Tone is normal. Bulk is normal. 5/5 strength was present in all four extremities. Reduced L grip strength.Reduced rapid finger movements on the L orbits right over left upper extremity.  Okay but fine in the morning ?Sensory: ?Sensation is symmetric to light touch in the arms and legs.  ?Cerebellar: ?FNF with mild L hand ataxia ?  ?NIH 1 . Premorbid modified Rankin 0 ? ?ASSESSMENT/PLAN ?Jerry [name] is a 66 y.o. 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 MRI brain showed several new R hemispheric infarcts.  ? 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  Loop recorder placed, negative for AF so far.  TCD negative for large PFO. TEE with possible small PFO and large atheromatous plaque.  ? ?CTA head & neck: Extensive bilateral ICA siphon to moderate plaque with right supraclinoid and moderate to severe left cavernous stenosis.  Moderate stenosis of the left MCA anterior division M2.  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  Left PCA P2 and right PCA P3 moderate to severe stenoses. ?MRI: Scattered acute right hemispheric infarcts in multiple vascular territories. ?2D Echo: (4/16), EF 70 to 75%, no LAE, no thrombus or interatrial shunt. ?LDL 88 ?HgbA1c 6.2 ?VTE prophylaxis - Lovenox ?   ?Diet  ? Diet heart healthy/carb modified Room service appropriate? Yes; Fluid  consistency: Thin  ? ?aspirin 81 mg daily and clopidogrel 75 mg daily prior to admission, now on aspirin 81 mg daily and Brilinta (ticagrelor) 90 mg bid. Continue for 4 weeks then ASA alone.  Continue [employer]

## 2021-11-03 LAB — ANA W/REFLEX IF POSITIVE: Anti Nuclear Antibody (ANA): NEGATIVE

## 2021-11-05 ENCOUNTER — Other Ambulatory Visit: Payer: Self-pay | Admitting: *Deleted

## 2021-11-05 NOTE — Patient Outreach (Signed)
[facility]) Care Management ? ?11/05/2021 ? ?Jerry [name] ?1955-08-20 ?[id number] ? ? ?RED ON EMMI ALERT Stroke ?Day # 9 ?Date: 4/28 ?Red Alert Reason: Feeling worse overall?  YES ? ? ?Outreach attempt #1, successful to wife.  DPR on file.  Identity verified.  This care manager introduced self and stated purpose of call.  [facility] care management services explained.   ? ?Wife report member is "fairly" independent, only needing help with dressing if it requires buttons.  He has been hospitalized with a stroke twice in the last 30 days.  After most recent admission, he has been experiencing more left arm weakness, which prompted the above noted alert.  He will start PT soon, have been following up closely with neurology office as he is in a research trial for stroke patients.  Had phone visit with PCP last week, will have face to face on 5/11.  Neurology appointment scheduled for 6/8, she will call to schedule cardiology appointment.  She would like to monitor blood pressure however does not have a machine. Monitoring blood sugars daily, working with primary office to obtain [employer].  Denies any urgent concerns, encouraged to contact this care manager with questions.   ? ?Plan: ?RN CM will send BP monitor and education regarding stroke recovery and prevention.  Will follow up within the next 2 weeks. ? ?[name], RN, MSN ?[facility] Care Management  ?Community Care Manager ?[phone] ? ? ?

## 2021-11-05 NOTE — Patient Outreach (Signed)
Received a red flag Emmi stroke notification for Mr. Kaas °I have assigned Monica Lane, RN to call for follow up and determine if there are any Case Management needs.  °  °Deejay Koppelman, CBCS, CMAA °[facility] Care Management Assistant °[facility] Care Management °[phone]  °

## 2021-11-08 ENCOUNTER — Telehealth: Payer: Self-pay | Admitting: Neurology

## 2021-11-08 LAB — ANTIPHOSPHOLIPID SYNDROME EVAL, BLD
Anticardiolipin IgA: <9 APL U/mL (ref 0–11)
Anticardiolipin IgG: <9 GPL U/mL (ref 0–14)
Anticardiolipin IgM: <9 MPL U/mL (ref 0–12)
DRVVT: 33.8 s (ref 0.0–47.0)
PTT Lupus Anticoagulant: 56.3 s — ABNORMAL HIGH (ref 0.0–43.5)
Phosphatydalserine, IgA: <1 APS Units (ref 0–19)
Phosphatydalserine, IgG: <9 Units (ref 0–30)
Phosphatydalserine, IgM: 21 Units (ref 0–30)

## 2021-11-08 LAB — PTT-LA MIX: PTT-LA Mix: 50.7 s — ABNORMAL HIGH (ref 0.0–40.5)

## 2021-11-08 LAB — HEXAGONAL PHASE PHOSPHOLIPID: Hexagonal Phase Phospholipid: 4 s (ref 0–11)

## 2021-11-08 NOTE — Telephone Encounter (Signed)
FYI for Maralyn SagoSarah, NP and POD 2 ?Wife called stating pt is having an increase of headaches.  Phone rep confirmed with referrals if it would be ok to schedule pt to be seen earlier than the 05-28 date mentioned on Discharge from hospital, was given ok to offer Sarah's next available.  ?

## 2021-11-09 NOTE — Progress Notes (Signed)
Kindly inform the patient that lab work for abnormal clotting was normal.  Nothing to worry about

## 2021-11-12 ENCOUNTER — Telehealth: Payer: Self-pay | Admitting: *Deleted

## 2021-11-12 ENCOUNTER — Telehealth: Payer: Self-pay

## 2021-11-12 NOTE — Telephone Encounter (Signed)
-----   Message from Pramod S Sethi, MD sent at 11/09/2021 11:55 AM EDT ----- ?Kindly inform the patient that lab work for abnormal clotting was normal.  Nothing to worry about ?

## 2021-11-12 NOTE — Telephone Encounter (Signed)
Spoke with pt's wife, Shirlee [place] (on [place]). Informed of normal results. She verbalized understanding and expressed appreciation for the call. ?

## 2021-11-12 NOTE — Telephone Encounter (Signed)
-----   Message from Garvin Fila, MD sent at 11/09/2021 11:55 AM EDT ----- ?Kindly inform the patient that lab work for abnormal clotting was normal.  Nothing to worry about ?

## 2021-11-12 NOTE — Telephone Encounter (Signed)
I called both mobile and home numbers to discuss lab results. No answer on either and neither have functioning VMs. Will try again later. ?

## 2021-11-15 DIAGNOSIS — N179 Acute kidney failure, unspecified: Secondary | ICD-10-CM | POA: Insufficient documentation

## 2021-11-17 ENCOUNTER — Other Ambulatory Visit (HOSPITAL_COMMUNITY): Payer: Self-pay

## 2021-11-17 ENCOUNTER — Telehealth (HOSPITAL_COMMUNITY): Payer: Self-pay

## 2021-11-17 NOTE — Telephone Encounter (Signed)
Pharmacy Transitions of Care Follow-up Telephone Call ? ?Date of discharge: 11/02/2021  ?Discharge Diagnosis: Acute CVA ? ?How have you been since you were released from the hospital? Spoke to wife on phone, pt is doing well and has no medication concerns.  ? ?Medication changes made at discharge: ? - START: aspirin, ticagrelor ? - STOPPED: clopidogrel, HCTZ ? ?Medication changes verified by the patient? Yes ? ?Medication Accessibility: ? ?Was the patient provided with refills on discharged medications? No. Pt is enrolled in [employer] stroke prevention study; followed by [facility].  ? ?Duane Boston, PharmD Candidate ?[employer] [facility] ? ? ?

## 2021-11-20 ENCOUNTER — Other Ambulatory Visit: Payer: Self-pay | Admitting: *Deleted

## 2021-11-20 ENCOUNTER — Inpatient Hospital Stay: Payer: Medicare HMO | Admitting: Neurology

## 2021-11-20 NOTE — Patient Outreach (Signed)
[facility]) Care Management ? ?11/20/2021 ? ?[name] ?07-22-1955 ?[id number] ? ? ?Outgoing call placed to member/wife, successful.  She state member is doing better, mobility has improved.  He had evaluation completed, will start therapy sessions in June.  Remains in clinical trial with neurology clinic, follow up scheduled for 5/30.  No appointment with cardiology yet, provided with contact information, state she will call today.   ? ?Report member is waking up with night sweats, history of diabetes but now monitoring blood sugars.  State member had meter ordered (Accucheck) however when she went to pick it up from pharmacy was told that insurance wouldn't pay for it.  Cost was $30 but she state they weren't able to afford.  She inquires if there is a meter that the insurance will completely pay for.  Agrees to have pharmacy team contact her to discuss.  Denies any urgent concerns, encouraged to contact this care manager with questions.  Agrees to follow up within the next week. ? ?[name], RN, MSN, CCM ?[facility] Care Management  ?Community Care Manager ?[phone] ? ?

## 2021-11-20 NOTE — Patient Outreach (Signed)
[facility] ? ?11/20/2021 ? ?Mariah MillingBarry D Kloth ?January 04, 1956 ?[id number] ? ? ?Referral from Kemper DurieMonica Lane, RN for DME Assistance sent to [facility] via email at [email or web address].  ? ?Baruch Goutyarliceia Vy Badley ?[facility]-Care Management Assistant ?[phone]  ?

## 2021-11-27 ENCOUNTER — Other Ambulatory Visit: Payer: Self-pay | Admitting: *Deleted

## 2021-11-27 NOTE — Patient Outreach (Signed)
[facility]) Care Management  11/27/2021  Jerry [name] January 20, 1956 [id number]   Outgoing call placed to wife, successful.  State member is progressing, continues to work with neurology team for the clinical trial.  Anticipating starting therapy sessions next month, will follow up with neurology and cardiology on 5/30.  Report night sweats have decreased, still waiting on call from pharmacy team regarding glucose meter.  She will call this care manager if no call and still in need of a meter by the end of the week.  Otherwise, will close case at this time, all needs/concerns addressed.  Denies any urgent concerns, encouraged to contact this care manager with questions.    [name], RN, MSN, CCM [facility] Care Management  [facility] Manager [phone]

## 2021-11-28 ENCOUNTER — Ambulatory Visit (INDEPENDENT_AMBULATORY_CARE_PROVIDER_SITE_OTHER): Payer: Medicare HMO

## 2021-11-28 DIAGNOSIS — I639 Cerebral infarction, unspecified: Secondary | ICD-10-CM | POA: Diagnosis not present

## 2021-11-29 LAB — CUP PACEART REMOTE DEVICE CHECK
Date Time Interrogation Session: 20230524200432
Implantable Pulse Generator Implant Date: 20230417

## 2021-12-04 ENCOUNTER — Ambulatory Visit: Payer: Medicare HMO | Admitting: Neurology

## 2021-12-04 ENCOUNTER — Encounter: Payer: Self-pay | Admitting: Internal Medicine

## 2021-12-04 ENCOUNTER — Ambulatory Visit (INDEPENDENT_AMBULATORY_CARE_PROVIDER_SITE_OTHER): Payer: Medicare HMO | Admitting: Internal Medicine

## 2021-12-04 ENCOUNTER — Encounter: Payer: Self-pay | Admitting: Neurology

## 2021-12-04 ENCOUNTER — Telehealth: Payer: Self-pay | Admitting: Neurology

## 2021-12-04 VITALS — BP 132/80 | HR 81 | Ht 76.0 in | Wt 257.8 lb

## 2021-12-04 VITALS — BP 120/77 | HR 88 | Ht 76.0 in | Wt 258.8 lb

## 2021-12-04 DIAGNOSIS — I639 Cerebral infarction, unspecified: Secondary | ICD-10-CM | POA: Diagnosis not present

## 2021-12-04 DIAGNOSIS — G3184 Mild cognitive impairment, so stated: Secondary | ICD-10-CM

## 2021-12-04 DIAGNOSIS — M542 Cervicalgia: Secondary | ICD-10-CM

## 2021-12-04 MED ORDER — EZETIMIBE 10 MG PO TABS: 10.0000 mg | ORAL_TABLET | Freq: Every day | ORAL | 3 refills | Status: DC

## 2021-12-04 NOTE — Telephone Encounter (Signed)
[employer] medicare Sent to GI they obtain auth and will call the patient to schedule

## 2021-12-04 NOTE — Patient Instructions (Signed)
Medication Instructions:  START: ZETIA 10mg  ONCE DAILY  *If you need a refill on your cardiac medications before your next appointment, please call your pharmacy*  Lab Work: None Ordered At This Time.  If you have labs (blood work) drawn today and your tests are completely normal, you will receive your results only by: MyChart Message (if you have MyChart) OR A paper copy in the mail If you have any lab test that is abnormal or we need to change your treatment, we will call you to review the results.  Testing/Procedures: None Ordered At This Time.   Follow-Up: At [facility], you and your health needs are our priority.  As part of our continuing mission to provide you with exceptional heart care, we have created designated Provider Care Teams.  These Care Teams include your primary Cardiologist (physician) and Advanced Practice Providers (APPs -  Physician Assistants and Nurse Practitioners) who all work together to provide you with the care you need, when you need it.  We recommend signing up for the patient portal called "MyChart".  Sign up information is provided on this After Visit Summary.  MyChart is used to connect with patients for Virtual Visits (Telemedicine).  Patients are able to view lab/test results, encounter notes, upcoming appointments, etc.  Non-urgent messages can be sent to your provider as well.   To learn more about what you can do with MyChart, go to [email or web address].    Your next appointment:   6 month(s)  The format for your next appointment:   In Person  Provider:   Maisie Fus, MD   REFERRAL TO ELECTROPHYSIOLOGY TO HAVE LOOP RECORDER REMOVED

## 2021-12-04 NOTE — Progress Notes (Signed)
Cardiology Office Note:    Date:  12/04/2021   ID:  Jerry [name], DOB 1956-06-18, MRN [id number]  PCP:  No primary care provider on file.   [facility] Providers Cardiologist:  Maisie Fus, MD     Referring MD: Roderick Pee, PA   No chief complaint on file. Stroke  History of Present Illness:    Jerry [name] is a 66 y.o. 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 Presented with worsening lower extremity weakness and ataxia. MR brain with acute/subacute nonhemorrhagic infarct involving the right caudate head, right middle lobe, right frontal lobe. TTE did not show PFO with agitated bubble study.  A loop recorder placed was inserted by Dr. Graciela Husbands. He presented again with tingling and numbness of the left side of the face. His loop recorder was interrogated and did not show afib. He had an MRI brain showed which showed features concerning for possible embolic stroke involving the right cerebral hemisphere. He underwent TEE. Did not show significant PFO. Showed significant atheroma in the thoracic aorta. Managed with asa/brilinta for 4 weeks and then asa alone.  No evidence of afib. He was also enrolled in a stroke study above.  Interim Hx 12/04/2021:   He stopped smoking after smoking since he was a teenager! Blood pressures at home are well controlled. He feels well with no significant deficits. He comes in with his wife. Prior to his stroke they have endured a lot of stress. Multiple family members passed away. Also, his wife was in a nursing facility for four years.  Cardiac studies: Cath 2014- non obstructive dx. Indication abnormal myoview. TTE 10/21/2021- normal study.    Past Medical History:  Diagnosis Date   Arthritis    Diabetes mellitus 2008   type II   Dyslipidemia    Erectile  dysfunction    History of cardiac catheterization 06/2013   Dr. Peter Swaziland   HTN (hypertension)    Hypogonadism male     Past Surgical History:  Procedure Laterality Date   APPENDECTOMY     BUBBLE STUDY  11/02/2021   Procedure: BUBBLE STUDY;  Surgeon: Maisie Fus, MD;  Location: [facility] ENDOSCOPY;  Service: Cardiovascular;;   COLONOSCOPY  08/2006   Dr. Russella Dar - repeat 2018   INCISION AND DRAINAGE PERIRECTAL ABSCESS     KNEE ARTHROSCOPY Left 12/28/2014   Procedure: ARTHROSCOPY LEFT KNEE WITH  MEDIAL MENSICUS DEBRIDEMENT;  Surgeon: Ollen Gross, MD;  Location: [facility] ORS;  Service: Orthopedics;  Laterality: Left;   KNEE SURGERY     left knee - remote past   LEFT HEART CATHETERIZATION WITH CORONARY ANGIOGRAM N/A 06/11/2013   Procedure: LEFT HEART CATHETERIZATION WITH CORONARY ANGIOGRAM;  Surgeon: Peter M Swaziland, MD;  Location: [facility] CATH LAB;  Service: Cardiovascular;  Laterality: N/A;   LOOP RECORDER INSERTION N/A 10/22/2021   Procedure: LOOP RECORDER INSERTION;  Surgeon: Duke Salvia, MD;  Location: [facility] INVASIVE CV LAB;  Service: Cardiovascular;  Laterality: N/A;   right arm and elbow surgery     TEE WITHOUT CARDIOVERSION N/A 11/02/2021   Procedure: TRANSESOPHAGEAL ECHOCARDIOGRAM (TEE);  Surgeon: Maisie Fus, MD;  Location: [facility] ENDOSCOPY;  Service: Cardiovascular;  Laterality: N/A;   TONSILLECTOMY     TOTAL KNEE ARTHROPLASTY Left 10/04/2015   Procedure: LEFT TOTAL KNEE ARTHROPLASTY  right knee aspiration and injection;  Surgeon: Ollen Gross, MD;  Location: [facility] ORS;  Service: Orthopedics;  Laterality: Left;   TOTAL KNEE ARTHROPLASTY Right 11/04/2016   Procedure: RIGHT TOTAL KNEE ARTHROPLASTY;  Surgeon: Ollen Gross, MD;  Location: [facility] ORS;  Service: Orthopedics;  Laterality: Right;  with abductor block   wisdom teeth extractions      Current Medications: Current Meds  Medication Sig   acetaminophen (TYLENOL) 500 MG tablet Take 1,000 mg by mouth every 6 (six) hours as needed for mild pain.    amLODipine (NORVASC) 10 MG tablet Take 1 tablet (10 mg total) by mouth daily.   aspirin EC 81 MG tablet Take 1 tablet (81 mg total) by mouth daily. Swallow whole.   Blood Glucose Monitoring Suppl (GLUCOCOM BLOOD GLUCOSE MONITOR) DEVI Please check blood sugar 5 times per day due to fluctuating blood sugars.   docusate sodium (COLACE) 100 MG capsule Take 1 capsule (100 mg total) by mouth every 12 (twelve) hours. (Patient taking differently: Take 100 mg by mouth 2 (two) times daily as needed for moderate constipation.)   ezetimibe (ZETIA) 10 MG tablet Take 1 tablet (10 mg total) by mouth daily.   glucose blood test strip Please check blood sugar 5 times per day due to fluctuating blood sugars.   ibuprofen (ADVIL) 200 MG tablet Take 400 mg by mouth every 6 (six) hours as needed for headache or moderate pain.   Insulin Glargine (LANTUS SOLOSTAR) 100 UNIT/ML Solostar Pen Inject 15 Units into the skin 2 (two) times daily. (Patient taking differently: Inject 15 Units into the skin at bedtime.)   melatonin 5 MG TABS Take 5 mg by mouth at bedtime as needed (sleep).   metFORMIN (GLUCOPHAGE) 1000 MG tablet Take 1 tablet (1,000 mg total) by mouth daily. (Patient taking differently: Take 1,000 mg by mouth 2 (two) times daily with a meal.)   Multiple Vitamins-Minerals (CENTRUM SILVER 50+MEN) TABS Take 1 tablet by mouth daily.   rosuvastatin (CRESTOR) 40 MG tablet Take 1 tablet (40 mg total) by mouth daily.   sertraline (ZOLOFT) 50 MG tablet Take 50 mg by mouth daily.   Study - OCEANIC-STROKE - asundexian 50 mg or placebo tablet (PI-Sethi) Take 1 tablet (50 mg total) by mouth daily. For Investigational Use Only. Take at the same time each day (preferably in the morning). Tablet should be swallowed whole with water; it CANNOT be crushed or broken. Please contact [facility] if you have any questions regarding this medication or study.     Allergies:   Diclofenac sodium, Lisinopril, Losartan, and  Voltaren [diclofenac]   Social History   Socioeconomic History   Marital status: Married    Spouse name: Not on file   Number of children: Not on file   Years of education: Not on file   Highest education level: Not on file  Occupational History   Occupation: retired    Comment: prior physical therapist  Tobacco Use   Smoking status: Former    Types: Cigarettes    Quit date: 11/11/2001    Years since quitting: 20.0   Smokeless tobacco: Never  Substance and Sexual Activity   Alcohol use: No   Drug use: No   Sexual activity: Never  Other Topics Concern   Not on file  Social History Narrative   Married 22 years, 2 daughter and 1 son. Retired, used to work as a PT.  Exercises treadmill daily, some weight bearing exercise. Diet    Social Determinants  of Health   Financial Resource Strain: Not on file  Food Insecurity: Not on file  Transportation Needs: Not on file  Physical Activity: Not on file  Stress: Not on file  Social Connections: Not on file     Family History: The patient's family history includes Diabetes in his mother; Other in his brother and father. There is no history of Stroke, Heart disease, Cancer, or Hypertension.  ROS:   Please see the history of present illness.  All other systems reviewed and are negative.  EKGs/Labs/Other Studies Reviewed:    The following studies were reviewed today:   EKG:  EKG is  ordered today.  The ekg ordered today demonstrates   NSR  Recent Labs: 11/01/2021: ALT 17 11/02/2021: BUN 14; Creatinine, Ser 1.00; Hemoglobin 13.1; Magnesium 2.6; Platelets 231; Potassium 3.6; Sodium 130   Recent Lipid Panel    Component Value Date/Time   CHOL 163 10/21/2021 0406   TRIG 84 10/21/2021 0406   HDL 58 10/21/2021 0406   CHOLHDL 2.8 10/21/2021 0406   VLDL 17 10/21/2021 0406   LDLCALC 88 10/21/2021 0406     Risk Assessment/Calculations:           Physical Exam:    VS:  Vitals:   12/04/21 1406  BP: 132/80  Pulse: 81   SpO2: 99%     Wt Readings from Last 3 Encounters:  12/04/21 257 lb 12.8 oz (116.9 kg)  12/04/21 258 lb 12.8 oz (117.4 kg)  11/02/21 264 lb 8.8 oz (120 kg)     GEN:  Well nourished, well developed in no acute distress HEENT: Normal NECK: No JVD; No carotid bruits LYMPHATICS: No lymphadenopathy CARDIAC: RRR, no murmurs, rubs, gallops RESPIRATORY:  Clear to auscultation without rales, wheezing or rhonchi  ABDOMEN: Soft, non-tender, non-distended MUSCULOSKELETAL:  No edema; No deformity  SKIN: Warm and dry NEUROLOGIC:  Alert and oriented x 3 PSYCHIATRIC:  Normal affect   ASSESSMENT:    Ischemic CVA: TEE did not show significant PFO. He has grade IV atheroma , this was possibly the embolic source. LDL goal < 70 mg/dL; LDL is 88. He stopped smoking which is great. A1c goal < 7. His blood pressure is well managed. On ASA and possibly the study drug ( factor XIa) for stroke prevention, continue crestor 40 mg daily and will add zetia with LDL not at goal.   Loop recorder: referral to EP for consideration of removal. Placed 10/22/2021. No afib on prior interrogation (monitoring period April 17th-May 24th); he's had it for > 30 days.  PLAN:    In order of problems listed above:  Add zetia 10 mg daily EP referral for consideration of  ILR removal Follow up in 6 months      Medication Adjustments/Labs and Tests Ordered: Current medicines are reviewed at length with the patient today.  Concerns regarding medicines are outlined above.  Orders Placed This Encounter  Procedures   Ambulatory referral to Cardiac Electrophysiology   EKG 12-Lead   Meds ordered this encounter  Medications   ezetimibe (ZETIA) 10 MG tablet    Sig: Take 1 tablet (10 mg total) by mouth daily.    Dispense:  90 tablet    Refill:  3    Patient Instructions  Medication Instructions:  START: ZETIA 10mg  ONCE DAILY  *If you need a refill on your cardiac medications before your next appointment, please call  your pharmacy*  Lab Work: None Ordered At This Time.  If you have labs (blood  work) drawn today and your tests are completely normal, you will receive your results only by: MyChart Message (if you have MyChart) OR A paper copy in the mail If you have any lab test that is abnormal or we need to change your treatment, we will call you to review the results.  Testing/Procedures: None Ordered At This Time.   Follow-Up: At [facility], you and your health needs are our priority.  As part of our continuing mission to provide you with exceptional heart care, we have created designated Provider Care Teams.  These Care Teams include your primary Cardiologist (physician) and Advanced Practice Providers (APPs -  Physician Assistants and Nurse Practitioners) who all work together to provide you with the care you need, when you need it.  We recommend signing up for the patient portal called "MyChart".  Sign up information is provided on this After Visit Summary.  MyChart is used to connect with patients for Virtual Visits (Telemedicine).  Patients are able to view lab/test results, encounter notes, upcoming appointments, etc.  Non-urgent messages can be sent to your provider as well.   To learn more about what you can do with MyChart, go to [email or web address].    Your next appointment:   6 month(s)  The format for your next appointment:   In Person  Provider:   Maisie Fus, MD   REFERRAL TO ELECTROPHYSIOLOGY TO HAVE LOOP RECORDER REMOVED             Signed, Maisie Fus, MD  12/04/2021 5:39 PM    [facility]

## 2021-12-04 NOTE — Progress Notes (Signed)
[facility] [address] [place]. [place] [address] [phone]       OFFICE FOLLOW-UP NOTE  Jerry [name] Date of Birth:  1955/10/31 Medical Record Number:  [id number]   HPI: [name] is 66 year old African-American male seen today for initial office follow-up visit following hospital admission for stroke in April 2023.  He is accompanied by his wife.  History is obtained from them and review of electronic medical records and opossum reviewed pertinent available imaging films in PACS.  He has past medical history of diabetes, hypertension, hyperlipidemia, obesity who initially presented on 10/21/2021 to [facility] with left leg weakness for 4 days prior to admission.  MRI scan showed acute infarcts involving right caudate, right parietal and frontal lobes.  CTA of the brain and neck showed no large vessel stenosis or occlusion.  Echocardiogram showed ejection fraction of 70 to 75% with grade 1 diastolic dysfunction.  LDL cholesterol is 88 mg percent.  Hemoglobin A 1C was 6.2.  Patient stroke etiology was felt to be cryptogenic and loop recorder was placed.  He was started on dual antiplatelet therapy of aspirin and Plavix.  He returned 10 days later on 11/01/2021 with sudden onset left hand and lower face numbness.  Repeat MRI scan at this time showed several additional new infarcts all again in the right hemisphere.  This time a TEE was done which showed moderate aortic arch blocks as well as a small PFO.  However TCD bubble study was negative for significant right-to-left shunt and the PFO was felt to be clinically insignificant.  Loop recorder did not show any paroxysmal A-fib.  Patient was on aspirin and Plavix which was changed to aspirin and Brilinta for 4 weeks followed by aspirin alone.  Patient was also randomized to participate in the [facility] stroke prevention study ( asundexian versus placebo and standard medical therapy )  patient states he is done  well his left-sided numbness and weakness has resolved is almost back to his baseline.  He is planning to start outpatient physical therapy later on this month.  He has quit smoking completely since her stroke.  He still occasionally drops objects from his hand when he is tired and not careful.  He is noted some diminished fine motor skills in the left hand as well.  For the last 2 weeks he has been complaining of posterior neck and shoulder pain that occasionally radiates into either shoulder more often to the right.  He is also noticed some cognitive difficulties with difficulty in calculations and thinking through problems quickly has to do it slowly eventually gets it this is new since the stroke.  ROS:   14 system review of systems is positive for weakness, numbness, bruising, memory difficulties, neck pain all other systems negative  PMH:  Past Medical History:  Diagnosis Date   Arthritis    Diabetes mellitus 2008   type II   Dyslipidemia    Erectile dysfunction    History of cardiac catheterization 06/2013   Dr. Peter Martinique   HTN (hypertension)    Hypogonadism male     Social History:  Social History   Socioeconomic History   Marital status: Married    Spouse name: Not on file   Number of children: Not on file   Years of education: Not on file   Highest education level: Not on file  Occupational History   Occupation: retired    Comment: prior physical therapist  Tobacco Use  Smoking status: Former    Types: Cigarettes    Quit date: 11/11/2001    Years since quitting: 20.0   Smokeless tobacco: Never  Substance and Sexual Activity   Alcohol use: No   Drug use: No   Sexual activity: Never  Other Topics Concern   Not on file  Social History Narrative   Married 3 years, 2 daughter and 1 son. Retired, used to work as a PT.  Exercises treadmill daily, some weight bearing exercise. Diet    Social Determinants of Health   Financial Resource Strain: Not on file  Food  Insecurity: Not on file  Transportation Needs: Not on file  Physical Activity: Not on file  Stress: Not on file  Social Connections: Not on file  Intimate Partner Violence: Not on file    Medications:   Current Outpatient Medications on File Prior to Visit  Medication Sig Dispense Refill   acetaminophen (TYLENOL) 500 MG tablet Take 1,000 mg by mouth every 6 (six) hours as needed for mild pain.     amLODipine (NORVASC) 10 MG tablet Take 1 tablet (10 mg total) by mouth daily. 30 tablet 2   aspirin EC 81 MG tablet Take 1 tablet (81 mg total) by mouth daily. Swallow whole. 90 tablet 0   Blood Glucose Monitoring Suppl (GLUCOCOM BLOOD GLUCOSE MONITOR) DEVI Please check blood sugar 5 times per day due to fluctuating blood sugars.     docusate sodium (COLACE) 100 MG capsule Take 1 capsule (100 mg total) by mouth every 12 (twelve) hours. (Patient taking differently: Take 100 mg by mouth 2 (two) times daily as needed for moderate constipation.) 30 capsule 0   glucose blood test strip Please check blood sugar 5 times per day due to fluctuating blood sugars.     ibuprofen (ADVIL) 200 MG tablet Take 400 mg by mouth every 6 (six) hours as needed for headache or moderate pain.     Insulin Glargine (LANTUS SOLOSTAR) 100 UNIT/ML Solostar Pen Inject 15 Units into the skin 2 (two) times daily. (Patient taking differently: Inject 15 Units into the skin at bedtime.) 15 mL 3   melatonin 5 MG TABS Take 5 mg by mouth at bedtime as needed (sleep).     metFORMIN (GLUCOPHAGE) 1000 MG tablet Take 1 tablet (1,000 mg total) by mouth daily. (Patient taking differently: Take 1,000 mg by mouth 2 (two) times daily with a meal.) 180 tablet 0   Multiple Vitamins-Minerals (CENTRUM SILVER 50+MEN) TABS Take 1 tablet by mouth daily.     rosuvastatin (CRESTOR) 40 MG tablet Take 1 tablet (40 mg total) by mouth daily. 30 tablet 2   sertraline (ZOLOFT) 50 MG tablet Take 50 mg by mouth daily.     Study - OCEANIC-STROKE - asundexian 50  mg or placebo tablet (PI-Lc Joynt) Take 1 tablet (50 mg total) by mouth daily. For Investigational Use Only. Take at the same time each day (preferably in the morning). Tablet should be swallowed whole with water; it CANNOT be crushed or broken. Please contact [facility] if you have any questions regarding this medication or study. 1 tablet 0   No current facility-administered medications on file prior to visit.    Allergies:   Allergies  Allergen Reactions   Diclofenac Sodium Other (See Comments)    Gel caused a burning sensation    Lisinopril Cough   Losartan Cough    Unknown bad side effects Unknown bad side effects   Voltaren [Diclofenac] Other (See Comments)  Gel caused a burning sensation     Physical Exam General: Obese middle-aged African-American male seated, in no evident distress Head: head normocephalic and atraumatic.  Neck: supple with no carotid or supraclavicular bruits Cardiovascular: regular rate and rhythm, no murmurs Musculoskeletal: no deformity Skin:  no rash/petichiae Vascular:  Normal pulses all extremities Vitals:   12/04/21 0806  BP: 120/77  Pulse: 88   Neurologic Exam Mental Status: Awake and fully alert. Oriented to place and time. Recent and remote memory intact. Attention span, concentration and fund of knowledge appropriate. Mood and affect appropriate.  Diminished recall 2/3.  Animal naming test 11.  Clock drawing 4/4. Cranial Nerves: Fundoscopic exam reveals sharp disc margins. Pupils equal, briskly reactive to light. Extraocular movements full without nystagmus. Visual fields full to confrontation. Hearing intact. Facial sensation intact. Face, tongue, palate moves normally and symmetrically.  Motor: Normal bulk and tone. Normal strength in all tested extremity muscles.  Diminished fine finger movements on the left.  Orbits right over left upper extremity. Sensory.: intact to touch ,pinprick .position and vibratory sensation.   Coordination: Rapid alternating movements normal in all extremities. Finger-to-nose and heel-to-shin performed accurately bilaterally. Gait and Station: Arises from chair without difficulty. Stance is normal. Gait demonstrates normal stride length and balance . Able to heel, toe and tandem walk with slight difficulty.  Reflexes: 1+ and symmetric. Toes downgoing.   NIHSS  0 Modified Rankin  2   ASSESSMENT: 66 year old African-American male with recurrent cryptogenic right hemispheric infarcts in April 2023.  Vascular risk factors of smoking, diabetes, hypertension, hyperlipidemia, aortic arch atherosclerosis and small PFO.  Patient is participating in the [facility] stroke research study     PLAN: I had a long d/w patient  and his wife about his recent  recurrent cryptogenic strokes, posterior neck pain and mild cognitive impairment, risk for recurrent stroke/TIAs, personally independently reviewed imaging studies and stroke evaluation results and answered questions.Continue aspirin 81 mg daily and [employer] Stroke study medication once daily( asundexian 50 mg versus Placebo )  for secondary stroke prevention and maintain strict control of hypertension with blood pressure goal below 130/90, diabetes with hemoglobin A1c goal below 6.5% and lipids with LDL cholesterol goal below 70 mg/dL.  Patient was found to have a small PFO but it is likely clinically insignificant since TCD bubble study was negative and due to his low ROPE score he is not a candidate for endovascular PFO closure.  I also advised the patient to eat a healthy diet with plenty of whole grains, cereals, fruits and vegetables, exercise regularly and maintain ideal body weight .I recommend he do neck stretching exercises for his musculoskeletal posterior neck pain and also try local massage.  Check MRI scan of the cervical spine to rule out any compressive disc problems.  Patient also has mild cognitive impairment and I recommend he  increase participation in cognitively challenging activities like solving crossword puzzles, playing bridge and sudoku.  Return for follow-up in the future in 6 months and keep scheduled follow-up with the [name] stroke research study in a few months.    Greater than 50% of time during this 35 minute visit was spent on counseling,explanation of diagnosis of cryptogenic stroke, planning of further management, discussion with patient and family and coordination of care Antony Contras, MD Note: This document was prepared with digital dictation and possible smart phrase technology. Any transcriptional errors that result from this process are unintentional

## 2021-12-04 NOTE — Patient Instructions (Addendum)
I had a long d/w patient  and his wife about his recent  recurrent cryptogenic strokes, posterior neck pain and mild cognitive impairment, risk for recurrent stroke/TIAs, personally independently reviewed imaging studies and stroke evaluation results and answered questions.Continue aspirin 81 mg daily and [employer] Stroke study medication once daily( asundexian 50 mg versus Placebo )  for secondary stroke prevention and maintain strict control of hypertension with blood pressure goal below 130/90, diabetes with hemoglobin A1c goal below 6.5% and lipids with LDL cholesterol goal below 70 mg/dL. I also advised the patient to eat a healthy diet with plenty of whole grains, cereals, fruits and vegetables, exercise regularly and maintain ideal body weight .I recommend he do neck stretching exercises for his musculoskeletal posterior neck pain and also try local massage.  Check MRI scan of the cervical spine to rule out any compressive disc problems.  Patient also has mild cognitive impairment and I recommend he increase participation in cognitively challenging activities like solving crossword puzzles, playing bridge and sudoku.  Return for follow-up in the future in 6 months and keep scheduled follow-up with the [facility] stroke research study in a few months.    Neck Exercises Ask your health care provider which exercises are safe for you. Do exercises exactly as told by your health care provider and adjust them as directed. It is normal to feel mild stretching, pulling, tightness, or discomfort as you do these exercises. Stop right away if you feel sudden pain or your pain gets worse. Do not begin these exercises until told by your health care provider. Neck exercises can be important for many reasons. They can improve strength and maintain flexibility in your neck, which will help your upper back and prevent neck pain. Stretching exercises Rotation neck stretching  Sit in a chair or stand up. Place your  feet flat on the floor, shoulder-width apart. Slowly turn your head (rotate) to the right until a slight stretch is felt. Turn it all the way to the right so you can look over your right shoulder. Do not tilt or tip your head. Hold this position for 10-30 seconds. Slowly turn your head (rotate) to the left until a slight stretch is felt. Turn it all the way to the left so you can look over your left shoulder. Do not tilt or tip your head. Hold this position for 10-30 seconds. Repeat __________ times. Complete this exercise __________ times a day. Neck retraction  Sit in a sturdy chair or stand up. Look straight ahead. Do not bend your neck. Use your fingers to push your chin backward (retraction). Do not bend your neck for this movement. Continue to face straight ahead. If you are doing the exercise properly, you will feel a slight sensation in your throat and a stretch at the back of your neck. Hold the stretch for 1-2 seconds. Repeat __________ times. Complete this exercise __________ times a day. Strengthening exercises Neck press  Lie on your back on a firm bed or on the floor with a pillow under your head. Use your neck muscles to push your head down on the pillow and straighten your spine. Hold the position as well as you can. Keep your head facing up (in a neutral position) and your chin tucked. Slowly count to 5 while holding this position. Repeat __________ times. Complete this exercise __________ times a day. Isometrics These are exercises in which you strengthen the muscles in your neck while keeping your neck still (isometrics). Sit in a  supportive chair and place your hand on your forehead. Keep your head and face facing straight ahead. Do not flex or extend your neck while doing isometrics. Push forward with your head and neck while pushing back with your hand. Hold for 10 seconds. Do the sequence again, this time putting your hand against the back of your head. Use your head  and neck to push backward against the hand pressure. Finally, do the same exercise on either side of your head, pushing sideways against the pressure of your hand. Repeat __________ times. Complete this exercise __________ times a day. Prone head lifts  Lie face-down (prone position), resting on your elbows so that your chest and upper back are raised. Start with your head facing downward, near your chest. Position your chin either on or near your chest. Slowly lift your head upward. Lift until you are looking straight ahead. Then continue lifting your head as far back as you can comfortably stretch. Hold your head up for 5 seconds. Then slowly lower it to your starting position. Repeat __________ times. Complete this exercise __________ times a day. Supine head lifts  Lie on your back (supine position), bending your knees to point to the ceiling and keeping your feet flat on the floor. Lift your head slowly off the floor, raising your chin toward your chest. Hold for 5 seconds. Repeat __________ times. Complete this exercise __________ times a day. Scapular retraction  Stand with your arms at your sides. Look straight ahead. Slowly pull both shoulders (scapulae) backward and downward (retraction) until you feel a stretch between your shoulder blades in your upper back. Hold for 10-30 seconds. Relax and repeat. Repeat __________ times. Complete this exercise __________ times a day. Contact a health care provider if: Your neck pain or discomfort gets worse when you do an exercise. Your neck pain or discomfort does not improve within 2 hours after you exercise. If you have any of these problems, stop exercising right away. Do not do the exercises again unless your health care provider says that you can. Get help right away if: You develop sudden, severe neck pain. If this happens, stop exercising right away. Do not do the exercises again unless your health care provider says that you  can. This information is not intended to replace advice given to you by your health care provider. Make sure you discuss any questions you have with your health care provider. Document Revised: 12/19/2020 Document Reviewed: 12/19/2020 Elsevier Patient Education  [place].  Stroke Prevention Some medical conditions and behaviors can lead to a higher chance of having a stroke. You can help prevent a stroke by eating healthy, exercising, not smoking, and managing any medical conditions you have. Stroke is a leading cause of functional impairment. Primary prevention is particularly important because a majority of strokes are first-time events. Stroke changes the lives of not only those who experience a stroke but also their family and other caregivers. How can this condition affect me? A stroke is a medical emergency and should be treated right away. A stroke can lead to brain damage and can sometimes be life-threatening. If a person gets medical treatment right away, there is a better chance of surviving and recovering from a stroke. What can increase my risk? The following medical conditions may increase your risk of a stroke: Cardiovascular disease. High blood pressure (hypertension). Diabetes. High cholesterol. Sickle cell disease. Blood clotting disorders (hypercoagulable state). Obesity. Sleep disorders (obstructive sleep apnea). Other risk factors include: Being  older than age 65. Having a history of blood clots, stroke, or mini-stroke (transient ischemic attack, TIA). Genetic factors, such as race, ethnicity, or a family history of stroke. Smoking cigarettes or using other tobacco products. Taking birth control pills, especially if you also use tobacco. Heavy use of alcohol or drugs, especially cocaine and methamphetamine. Physical inactivity. What actions can I take to prevent this? Manage your health conditions High cholesterol levels. Eating a healthy diet is important  for preventing high cholesterol. If cholesterol cannot be managed through diet alone, you may need to take medicines. Take any prescribed medicines to control your cholesterol as told by your health care provider. Hypertension. To reduce your risk of stroke, try to keep your blood pressure below 130/80. Eating a healthy diet and exercising regularly are important for controlling blood pressure. If these steps are not enough to manage your blood pressure, you may need to take medicines. Take any prescribed medicines to control hypertension as told by your health care provider. Ask your health care provider if you should monitor your blood pressure at home. Have your blood pressure checked every year, even if your blood pressure is normal. Blood pressure increases with age and some medical conditions. Diabetes. Eating a healthy diet and exercising regularly are important parts of managing your blood sugar (glucose). If your blood sugar cannot be managed through diet and exercise, you may need to take medicines. Take any prescribed medicines to control your diabetes as told by your health care provider. Get evaluated for obstructive sleep apnea. Talk to your health care provider about getting a sleep evaluation if you snore a lot or have excessive sleepiness. Make sure that any other medical conditions you have, such as atrial fibrillation or atherosclerosis, are managed. Nutrition Follow instructions from your health care provider about what to eat or drink to help manage your health condition. These instructions may include: Reducing your daily calorie intake. Limiting how much salt (sodium) you use to 1,500 milligrams (mg) each day. Using only healthy fats for cooking, such as olive oil, canola oil, or sunflower oil. Eating healthy foods. You can do this by: Choosing foods that are high in fiber, such as whole grains, and fresh fruits and vegetables. Eating at least 5 servings of fruits and  vegetables a day. Try to fill one-half of your plate with fruits and vegetables at each meal. Choosing lean protein foods, such as lean cuts of meat, poultry without skin, fish, tofu, beans, and nuts. Eating low-fat dairy products. Avoiding foods that are high in sodium. This can help lower blood pressure. Avoiding foods that have saturated fat, trans fat, and cholesterol. This can help prevent high cholesterol. Avoiding processed and prepared foods. Counting your daily carbohydrate intake.  Lifestyle If you drink alcohol: Limit how much you have to: 0-1 drink a day for women who are not pregnant. 0-2 drinks a day for men. Know how much alcohol is in your drink. In the [place] one drink equals one 12 oz bottle of beer (31mL), one 5 oz glass of wine (188mL), or one 1 oz glass of hard liquor (36mL). Do not use any products that contain nicotine or tobacco. These products include cigarettes, chewing tobacco, and vaping devices, such as e-cigarettes. If you need help quitting, ask your health care provider. Avoid secondhand smoke. Do not use drugs. Activity  Try to stay at a healthy weight. Get at least 30 minutes of exercise on most days, such as: Fast walking. Biking. Swimming. Medicines Take  over-the-counter and prescription medicines only as told by your health care provider. Aspirin or blood thinners (antiplatelets or anticoagulants) may be recommended to reduce your risk of forming blood clots that can lead to stroke. Avoid taking birth control pills. Talk to your health care provider about the risks of taking birth control pills if: You are over 22 years old. You smoke. You get very bad headaches. You have had a blood clot. Where to find more information American Stroke Association: www.strokeassociation.org Get help right away if: You or a loved one has any symptoms of a stroke. "BE FAST" is an easy way to remember the main warning signs of a stroke: B - Balance. Signs are  dizziness, sudden trouble walking, or loss of balance. E - Eyes. Signs are trouble seeing or a sudden change in vision. F - Face. Signs are sudden weakness or numbness of the face, or the face or eyelid drooping on one side. A - Arms. Signs are weakness or numbness in an arm. This happens suddenly and usually on one side of the body. S - Speech. Signs are sudden trouble speaking, slurred speech, or trouble understanding what people say. T - Time. Time to call emergency services. Write down what time symptoms started. You or a loved one has other signs of a stroke, such as: A sudden, severe headache with no known cause. Nausea or vomiting. Seizure. These symptoms may represent a serious problem that is an emergency. Do not wait to see if the symptoms will go away. Get medical help right away. Call your local emergency services (911 in the [place] Do not drive yourself to the hospital. Summary You can help to prevent a stroke by eating healthy, exercising, not smoking, limiting alcohol intake, and managing any medical conditions you may have. Do not use any products that contain nicotine or tobacco. These include cigarettes, chewing tobacco, and vaping devices, such as e-cigarettes. If you need help quitting, ask your health care provider. Remember "BE FAST" for warning signs of a stroke. Get help right away if you or a loved one has any of these signs. This information is not intended to replace advice given to you by your health care provider. Make sure you discuss any questions you have with your health care provider. Document Revised: 01/24/2020 Document Reviewed: 01/24/2020 Elsevier Patient Education  [place].

## 2021-12-08 ENCOUNTER — Ambulatory Visit
Admission: RE | Admit: 2021-12-08 | Discharge: 2021-12-08 | Disposition: A | Discharge status: Home / Self Care | Payer: Medicare HMO | Source: Ambulatory Visit | Attending: Neurology | Admitting: Neurology

## 2021-12-08 DIAGNOSIS — M542 Cervicalgia: Secondary | ICD-10-CM

## 2021-12-11 NOTE — Progress Notes (Signed)
Carelink Summary Report / Loop Recorder 

## 2021-12-13 ENCOUNTER — Inpatient Hospital Stay: Payer: Medicare HMO | Admitting: Neurology

## 2021-12-13 NOTE — Progress Notes (Signed)
I spoke to the patient and his wife and communicated results of the MRI scan cervical spine showing multilevel disc degenerative changes and spinal stenosis at C3-4 and C4-5.  I offered referral to spine surgeon but patient would like to try conservative therapy first. Local heat and massage as well as starting physical therapy.  If she does not get better or gets worse she will call me to get a referral to a spine surgeon.

## 2021-12-14 ENCOUNTER — Telehealth: Payer: Self-pay | Admitting: *Deleted

## 2021-12-14 NOTE — Telephone Encounter (Signed)
-----   Message from Micki Riley, MD sent at 12/13/2021  4:16 PM EDT ----- I spoke to the patient and his wife and communicated results of the MRI scan cervical spine showing multilevel disc degenerative changes and spinal stenosis at C3-4 and C4-5.  I offered referral to spine surgeon but patient would like to try conservative therapy first. Local heat and massage as well as starting physical therapy.  If she does not get better or gets worse she will call me to get a referral to a spine surgeon.

## 2021-12-18 ENCOUNTER — Inpatient Hospital Stay: Payer: Medicare HMO | Admitting: Neurology

## 2021-12-31 ENCOUNTER — Ambulatory Visit (INDEPENDENT_AMBULATORY_CARE_PROVIDER_SITE_OTHER): Payer: Medicare HMO

## 2021-12-31 DIAGNOSIS — I639 Cerebral infarction, unspecified: Secondary | ICD-10-CM | POA: Diagnosis not present

## 2022-01-01 LAB — CUP PACEART REMOTE DEVICE CHECK
Date Time Interrogation Session: 20230626200406
Implantable Pulse Generator Implant Date: 20230417

## 2022-01-21 ENCOUNTER — Encounter: Payer: Self-pay | Admitting: Internal Medicine

## 2022-01-21 ENCOUNTER — Ambulatory Visit (INDEPENDENT_AMBULATORY_CARE_PROVIDER_SITE_OTHER): Payer: Medicare HMO | Admitting: Internal Medicine

## 2022-01-21 ENCOUNTER — Encounter (HOSPITAL_COMMUNITY): Payer: Self-pay | Admitting: Student in an Organized Health Care Education/Training Program

## 2022-01-21 ENCOUNTER — Ambulatory Visit (INDEPENDENT_AMBULATORY_CARE_PROVIDER_SITE_OTHER): Payer: Medicare HMO | Admitting: Student in an Organized Health Care Education/Training Program

## 2022-01-21 ENCOUNTER — Encounter (HOSPITAL_COMMUNITY): Payer: Self-pay

## 2022-01-21 VITALS — BP 128/88 | HR 81 | Resp 20 | Wt 263.0 lb

## 2022-01-21 VITALS — BP 112/66 | HR 85 | Ht 76.0 in | Wt 266.6 lb

## 2022-01-21 DIAGNOSIS — I728 Aneurysm of other specified arteries: Secondary | ICD-10-CM

## 2022-01-21 DIAGNOSIS — G4701 Insomnia due to medical condition: Secondary | ICD-10-CM | POA: Diagnosis not present

## 2022-01-21 DIAGNOSIS — F4321 Adjustment disorder with depressed mood: Secondary | ICD-10-CM

## 2022-01-21 DIAGNOSIS — F332 Major depressive disorder, recurrent severe without psychotic features: Secondary | ICD-10-CM | POA: Diagnosis not present

## 2022-01-21 DIAGNOSIS — I639 Cerebral infarction, unspecified: Secondary | ICD-10-CM | POA: Diagnosis not present

## 2022-01-21 MED ORDER — TRAZODONE HCL 50 MG PO TABS: 50.0000 mg | ORAL_TABLET | Freq: Every day | ORAL | 2 refills | Status: DC

## 2022-01-21 MED ORDER — SERTRALINE HCL 100 MG PO TABS: 100.0000 mg | ORAL_TABLET | Freq: Every day | ORAL | 2 refills | Status: DC

## 2022-01-21 NOTE — Progress Notes (Signed)
ELECTROPHYSIOLOGY CONSULT NOTE  Patient ID: Jerry [name], MRN: [id number], DOB/AGE: [id number] 66 y.o. Admit date: (Not on file) Date of Consult: 01/21/2022  Primary Physician: No primary care provider on file. Primary Cardiologist:  MBr     [name] is a 66 y.o. male who is being seen today for the evaluation of loop recorder explant at the request of Dr. [facility].    HPI Jerry [name] is a 66 y.o. male seen today following a loop recorder implantation 4/23 following an acute CVA; early April, leg weakness, with MRI imaging showing acute infarcts in the right caudate right parietal and frontal lobes, and then returning 10 days later with onset of left hand and face numbness, new infarcts again in the right hemisphere.  He was randomized to participate in the [facility] stroke prevention trial using a factor XIa inhibitor  TEE showed few bubbles request of a small PFO; significant thoracic atheromata; transthoracic echo was negative for bubbles.        Date Cr K Hgb  4/23 1.0 3.6 <<3.2 13.1            Past Medical History:  Diagnosis Date   Arthritis    Diabetes mellitus 2008   type II   Dyslipidemia    Erectile dysfunction    History of cardiac catheterization 06/2013   Dr. Peter Sw[place]iland   HTN (hypertension)    Hypogonadism male       Surgical History:  Past Surgical History:  Procedure Laterality Date   APPENDECTOMY     BUBBLE STUDY  11/02/2021   Procedure: BUBBLE STUDY;  Surgeon: Maisie Fus, MD;  Location: [facility] ENDOSCOPY;  Service: Cardiovascular;;   COLONOSCOPY  08/2006   Dr. Russella Dar - repeat 2018   INCISION AND DRAINAGE PERIRECTAL ABSCESS     KNEE ARTHROSCOPY Left 12/28/2014   Procedure: ARTHROSCOPY LEFT KNEE WITH  MEDIAL MENSICUS DEBRIDEMENT;  Surgeon: Ollen Gross, MD;  Location: [facility] ORS;  Service: Orthopedics;  Laterality: Left;   KNEE SURGERY     left knee - remote past   LEFT HEART CATHETERIZATION WITH CORONARY ANGIOGRAM N/A 06/11/2013    Procedure: LEFT HEART CATHETERIZATION WITH CORONARY ANGIOGRAM;  Surgeon: Peter M Swaziland, MD;  Location: [facility] CATH LAB;  Service: Cardiovascular;  Laterality: N/A;   LOOP RECORDER INSERTION N/A 10/22/2021   Procedure: LOOP RECORDER INSERTION;  Surgeon: Duke Salvia, MD;  Location: [facility] INVASIVE CV LAB;  Service: Cardiovascular;  Laterality: N/A;   right arm and elbow surgery     TEE WITHOUT CARDIOVERSION N/A 11/02/2021   Procedure: TRANSESOPHAGEAL ECHOCARDIOGRAM (TEE);  Surgeon: Maisie Fus, MD;  Location: [facility] ENDOSCOPY;  Service: Cardiovascular;  Laterality: N/A;   TONSILLECTOMY     TOTAL KNEE ARTHROPLASTY Left 10/04/2015   Procedure: LEFT TOTAL KNEE ARTHROPLASTY right knee aspiration and injection;  Surgeon: Ollen Gross, MD;  Location: [facility] ORS;  Service: Orthopedics;  Laterality: Left;   TOTAL KNEE ARTHROPLASTY Right 11/04/2016   Procedure: RIGHT TOTAL KNEE ARTHROPLASTY;  Surgeon: Ollen Gross, MD;  Location: [facility] ORS;  Service: Orthopedics;  Laterality: Right;  with abductor block   wisdom teeth extractions       Home Meds: Current Meds  Medication Sig   acetaminophen (TYLENOL) 500 MG tablet Take 1,000 mg by mouth every 6 (six) hours as needed for mild pain.   amLODipine (NORVASC) 10 MG tablet Take 1 tablet (10 mg total) by mouth daily.   Blood Glucose Monitoring Suppl (GLUCOCOM  BLOOD GLUCOSE MONITOR) DEVI Please check blood sugar 5 times per day due to fluctuating blood sugars.   docusate sodium (COLACE) 100 MG capsule Take 1 capsule (100 mg total) by mouth every 12 (twelve) hours. (Patient taking differently: Take 100 mg by mouth 2 (two) times daily as needed for moderate constipation.)   ezetimibe (ZETIA) 10 MG tablet Take 1 tablet (10 mg total) by mouth daily.   glucose blood test strip 2 times per day   ibuprofen (ADVIL) 200 MG tablet Take 400 mg by mouth every 6 (six) hours as needed for headache or moderate pain.   Insulin Glargine (LANTUS SOLOSTAR) 100 UNIT/ML Solostar Pen Inject 15  Units into the skin 2 (two) times daily. (Patient taking differently: Inject 15 Units into the skin at bedtime.)   melatonin 5 MG TABS Take 5 mg by mouth at bedtime as needed (sleep).   metFORMIN (GLUCOPHAGE) 1000 MG tablet Take 1 tablet (1,000 mg total) by mouth daily. (Patient taking differently: Take 1,000 mg by mouth 2 (two) times daily with a meal.)   Multiple Vitamins-Minerals (CENTRUM SILVER 50+MEN) TABS Take 1 tablet by mouth daily.   rosuvastatin (CRESTOR) 40 MG tablet Take 1 tablet (40 mg total) by mouth daily.   sertraline (ZOLOFT) 50 MG tablet Take 50 mg by mouth daily.   Study - OCEANIC-STROKE - asundexian 50 mg or placebo tablet (PI-Sethi) Take 1 tablet (50 mg total) by mouth daily. For Investigational Use Only. Take at the same time each day (preferably in the morning). Tablet should be swallowed whole with water; it CANNOT be crushed or broken. Please contact [facility] if you have any questions regarding this medication or study.    Allergies:  Allergies  Allergen Reactions   Diclofenac Sodium Other (See Comments)    Gel caused a burning sensation    Lisinopril Cough   Losartan Cough    Unknown bad side effects Unknown bad side effects    Social History   Socioeconomic History   Marital status: Married    Spouse name: Not on file   Number of children: Not on file   Years of education: Not on file   Highest education level: Not on file  Occupational History   Occupation: retired    Comment: prior physical therapist  Tobacco Use   Smoking status: Former    Types: Cigarettes    Quit date: 11/11/2001    Years since quitting: 20.2   Smokeless tobacco: Never  Substance and Sexual Activity   Alcohol use: No   Drug use: No   Sexual activity: Never  Other Topics Concern   Not on file  Social History Narrative   Married 22 years, 2 daughter and 1 son. Retired, used to work as a PT.  Exercises treadmill daily, some weight bearing exercise. Diet     Social Determinants of Health   Financial Resource Strain: Not on file  Food Insecurity: Not on file  Transportation Needs: Not on file  Physical Activity: Not on file  Stress: Not on file  Social Connections: Not on file  Intimate Partner Violence: Not on file     Family History  Problem Relation Age of Onset   Diabetes Mother    Other Father        death by MVA   Other Brother        died of MVA   Stroke Neg Hx    Heart disease Neg Hx    Cancer Neg Hx  Hypertension Neg Hx      ROS:  Please see the history of present illness.   {ros master:[id number]}  All other systems reviewed and negative.    Physical Exam:*** Blood pressure 112/66, pulse 85, height 6\' 4"  (1.93 m), weight 266 lb 9.6 oz (120.9 kg), SpO2 96 %. General: Well developed, well nourished male in no acute distress. Head: Normocephalic, atraumatic, sclera non-icteric, no xanthomas, nares are without discharge. EENT: normal  Lymph Nodes:  none Neck: Negative for carotid bruits. JVD not elevated. Back:without scoliosis kyphosis  Lungs: Clear bilaterally to auscultation without wheezes, rales, or rhonchi. Breathing is unlabored. Heart: RRR with S1 S2. No murmur . No rubs, or gallops appreciated. Abdomen: Soft, non-tender, non-distended with normoactive bowel sounds. No hepatomegaly. No rebound/guarding. No obvious abdominal masses. Msk:  Strength and tone appear normal for age. Extremities: No clubbing or cyanosis. No edema.  Distal pedal pulses are 2+ and equal bilaterally. Right subclavian artery aneurysm Skin: Warm and Dry Neuro: Alert and oriented X 3. CN III-XII intact Grossly normal sensory and motor function .  Still with left-sided weakness Psych:  Responds to questions appropriately with a normal affect.        EKG: sinus    Assessment and Plan:   Strokes-multiple-right hemisphere  Implantable loop recorder  Thoracic atheromata  Hypertension  Right subclavian artery aneurysm   The  patient has had multiple strokes localized to the right hemisphere.  He has thoracic atheromata for which he was initially treated with antiplatelet therapy and now is apparently been enrolled in the [facility] AF trial, factor XIa inhibitor trial for patients with AF.  We discussed the potential benefits of ongoing monitoring of his loop recorder for atrial fibrillation; we also discussed the lack of prospective data for anticoagulation in the treatment of SCAF identified in poststroke patients.  We have agreed to leave the loop recorder in place  Pulsatile right subclavian artery just over the thoracic inlet.  Literature  review via up-to-date suggest CTA would be appropriate for evaluation and to guide therapy [facility]

## 2022-01-21 NOTE — Plan of Care (Cosign Needed)
  Problem: Depression CCP Problem  1  Goal: LTG: Reduce frequency, intensity, and duration of depression symptoms as evidenced by: SSB input needed on appropriate metric Outcome: Not Applicable Goal: LTG: Gery Pray WILL SCORE LESS THAN 10 ON THE PATIENT HEALTH QUESTIONNAIRE (PHQ-9) Outcome: Not Applicable Goal: STG: Gery Pray WILL PARTICIPATE IN AT LEAST 80% OF SCHEDULED INDIVIDUAL PSYCHOTHERAPY SESSIONS Outcome: Not Applicable Goal: STG: Daouda WILL COMPLETE AT LEAST 80% OF ASSIGNED HOMEWORK Outcome: Not Applicable Goal: STG: Complete a psychiatric evaluation, including completion of PHQ-9 measures each visit Outcome: Not Applicable Goal: STG: Gery Pray WILL IDENTIFY  COGNITIVE PATTERNS AND BELIEFS THAT SUPPORT DEPRESSION Outcome: Not Applicable Goal: STG: Bernabe WILL PRACTICE BEHAVIORAL ACTIVATION SKILLS 1-3x TIMES PER WEEK FOR THE NEXT 20 WEEKS Outcome: Not Applicable
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
## 2022-01-21 NOTE — Patient Instructions (Addendum)
Medication Instructions:  Your physician recommends that you continue on your current medications as directed. Please refer to the Current Medication list given to you today.  *If you need a refill on your cardiac medications before your next appointment, please call your pharmacy*   Lab Work: None ordered.  If you have labs (blood work) drawn today and your tests are completely normal, you will receive your results only by: MyChart Message (if you have MyChart) OR A paper copy in the mail If you have any lab test that is abnormal or we need to change your treatment, we will call you to review the results.   Testing/Procedures: Non-Cardiac CT Angiography (CTA), is a special type of CT scan that uses a computer to produce multi-dimensional views of major blood vessels throughout the body. In CT angiography, a contrast material is injected through an IV to help visualize the blood vessels      Follow-Up: At [facility], you and your health needs are our priority.  As part of our continuing mission to provide you with exceptional heart care, we have created designated Provider Care Teams.  These Care Teams include your primary Cardiologist (physician) and Advanced Practice Providers (APPs -  Physician Assistants and Nurse Practitioners) who all work together to provide you with the care you need, when you need it.  We recommend signing up for the patient portal called "MyChart".  Sign up information is provided on this After Visit Summary.  MyChart is used to connect with patients for Virtual Visits (Telemedicine).  Patients are able to view lab/test results, encounter notes, upcoming appointments, etc.  Non-urgent messages can be sent to your provider as well.   To learn more about what you can do with MyChart, go to [email or web address].    Your next appointment:   Follow up as needed  Important Information About Sugar

## 2022-01-24 NOTE — Progress Notes (Signed)
Carelink Summary Report / Loop Recorder 

## 2022-01-31 ENCOUNTER — Telehealth: Payer: Self-pay | Admitting: Neurology

## 2022-01-31 NOTE — Telephone Encounter (Signed)
Patient was seen today briefly for [facility] stroke study Research follow-up visit.  He was accompanied by his wife. Patient is doing well and said no recurrent stroke or TIA symptoms. He remains on stroke study medication and is tolerating it well without any side effects.  He states he is compliant with his medications and has not missed a single dose. He states his stroke deficits have mostly resolved very occasionally when he is tired he may notice some clumsiness and weakness in the left hand but this is not all the time. He still complaining of pain and tightness in his neck and has been doing the neck stretching exercises as well as local heat and massage .patient does not want to refer to a spine surgeon but plans to talk to his primary care physician to seek a referral to Dr. Ethelene Hal pain specialist.   NIH stroke scale is 0. Modified Rankin scale is 1 Patient and wife were given opportunity to ask questions which were answered to their satisfaction. He was advised to continue the organic stroke study medication and return for follow-up in the future as per study protocol or call earlier if necessary.  Delia Heady, MD

## 2022-02-01 ENCOUNTER — Other Ambulatory Visit: Payer: Self-pay | Admitting: *Deleted

## 2022-02-01 DIAGNOSIS — I728 Aneurysm of other specified arteries: Secondary | ICD-10-CM

## 2022-02-01 DIAGNOSIS — Z01812 Encounter for preprocedural laboratory examination: Secondary | ICD-10-CM

## 2022-02-04 ENCOUNTER — Ambulatory Visit (INDEPENDENT_AMBULATORY_CARE_PROVIDER_SITE_OTHER): Payer: Medicare HMO

## 2022-02-04 DIAGNOSIS — I639 Cerebral infarction, unspecified: Secondary | ICD-10-CM

## 2022-02-04 LAB — CUP PACEART REMOTE DEVICE CHECK
Date Time Interrogation Session: 20230729200218
Implantable Pulse Generator Implant Date: 20230417

## 2022-02-05 ENCOUNTER — Other Ambulatory Visit: Payer: Medicare HMO

## 2022-02-05 DIAGNOSIS — I728 Aneurysm of other specified arteries: Secondary | ICD-10-CM

## 2022-02-05 DIAGNOSIS — Z01812 Encounter for preprocedural laboratory examination: Secondary | ICD-10-CM

## 2022-02-06 LAB — BASIC METABOLIC PANEL
BUN/Creatinine Ratio: 13 (ref 10–24)
BUN: 12 mg/dL (ref 8–27)
CO2: 22 mmol/L (ref 20–29)
Calcium: 9.5 mg/dL (ref 8.6–10.2)
Chloride: 105 mmol/L (ref 96–106)
Creatinine, Ser: 0.89 mg/dL (ref 0.76–1.27)
Glucose: 227 mg/dL — ABNORMAL HIGH (ref 70–99)
Potassium: 4.1 mmol/L (ref 3.5–5.2)
Sodium: 141 mmol/L (ref 134–144)
eGFR: 95 mL/min/{1.73_m2} (ref >59)

## 2022-02-14 ENCOUNTER — Telehealth: Payer: Self-pay

## 2022-02-14 ENCOUNTER — Ambulatory Visit (HOSPITAL_COMMUNITY): Admission: RE | Admit: 2022-02-14 | Payer: Medicare HMO | Source: Ambulatory Visit

## 2022-02-14 ENCOUNTER — Ambulatory Visit (HOSPITAL_COMMUNITY)
Admission: RE | Admit: 2022-02-14 | Discharge: 2022-02-14 | Disposition: A | Discharge status: Home / Self Care | Payer: Medicare HMO | Source: Ambulatory Visit | Attending: Internal Medicine | Admitting: Internal Medicine

## 2022-02-14 ENCOUNTER — Encounter (HOSPITAL_COMMUNITY): Payer: Self-pay

## 2022-02-14 DIAGNOSIS — I728 Aneurysm of other specified arteries: Secondary | ICD-10-CM

## 2022-02-14 NOTE — Telephone Encounter (Signed)
Phone call from [place] at [facility] CT - Pt currently awaiting CT.  She is questioning order for procedures and states this would be an inappropriate request per Dr Odessa Fleming note.  She will discuss with pt the need to rescheduled until orders can be clarified by Dr Graciela Husbands when he is back in the office.

## 2022-02-19 ENCOUNTER — Ambulatory Visit (INDEPENDENT_AMBULATORY_CARE_PROVIDER_SITE_OTHER): Payer: Medicare HMO | Admitting: Student in an Organized Health Care Education/Training Program

## 2022-02-19 VITALS — BP 128/77 | HR 100 | Resp 16 | Wt 264.6 lb

## 2022-02-19 DIAGNOSIS — F4321 Adjustment disorder with depressed mood: Secondary | ICD-10-CM

## 2022-02-19 DIAGNOSIS — F332 Major depressive disorder, recurrent severe without psychotic features: Secondary | ICD-10-CM | POA: Diagnosis not present

## 2022-02-19 MED ORDER — TRAZODONE HCL 50 MG PO TABS: 50.0000 mg | ORAL_TABLET | Freq: Every day | ORAL | 2 refills | Status: DC

## 2022-02-19 MED ORDER — SERTRALINE HCL 100 MG PO TABS: 100.0000 mg | ORAL_TABLET | Freq: Every day | ORAL | 2 refills | Status: DC

## 2022-02-19 NOTE — Telephone Encounter (Signed)
Spoke with Dr Graciela Husbands who would like for pt to proceed with Ct Angio/Aorta.  Molly with [facility] scheduling notified and she will contact pt to reschedule CT.

## 2022-02-19 NOTE — Plan of Care (Cosign Needed)
  Problem: Depression CCP Problem  1  Goal: LTG: Reduce frequency, intensity, and duration of depression symptoms as evidenced by: SSB input needed on appropriate metric Outcome: Progressing Goal: STG: Jerry [name] WILL PARTICIPATE IN AT LEAST 80% OF SCHEDULED INDIVIDUAL PSYCHOTHERAPY SESSIONS Outcome: Progressing Goal: STG: Complete a psychiatric evaluation, including completion of PHQ-9 measures each visit Outcome: Progressing Goal: STG: Jerry [name] WILL PRACTICE BEHAVIORAL ACTIVATION SKILLS 1-3x TIMES PER WEEK FOR THE NEXT 20 WEEKS Outcome: Progressing

## 2022-02-19 NOTE — Progress Notes (Signed)
[facility] [address] [place] [address] Dept: [phone] Dept Fax: [phone]  Psychotherapy Progress Note  Patient ID: Jerry [name], male  DOB: May 21, 1956, 66 y.o.  MRN: [id number]  02/19/2022 Start time: 1:03pm End time: 2:00pm  Method of Visit: Face-to-Face  Present: family/wife  Current Concerns: 1.  Mood: "All right" today, but endorses "moody" over the last few weeks.  Patient's wife reports that he has been more irritable over the last few weeks.  Patient uses the word "irritable" a lot about his feelings over the past few weeks.  2. medications: Patient reports that he has been sleeping well with trazodone and is not having any issues with his medications.  Patient reports he is now going to bed to sleep around 12 PM and waking up at 6 AM.  3.  Update: Patient reports he is having laser surgery on his eyes and has had 5 procedures thus far.  Patient verses that this has been a bit irritating for him, but he knows it is for the best interest of his health.  Patient reports that unfortunately he also was supposed to have a CT angio however despite sitting in the waiting room for many hours and did not come to fruition due to documentation concerns.  Patient reports that this is also coinciding with the day of his 41st wedding anniversary and he ultimately did not do anything with his wife despite her wishing to go out and do things.  Patient reports this is because he felt "down."  Wife confirms.  4.  Goals/Agenda: Patient was able to identify today what he hopes to get out of therapy. 1.  "A better understanding of what is going on with me" referring to why his personality has changed since he had a stroke.  Patient endorses that he feels he is more of an introvert or homebody since having his stroke.  Patient reports that Holl[place] his wife concerned his friends have also noticed a difference.  Patient reports that he  would like to "go back to being social and taking my wife out places."  2.  Processing grief that has occurred over his life span.  3.  Per wife, patient also agrees, the patient also appears to have some difficulty adjusting to change across the lifespan endorsing some difficulty with relationships with a of his 9 sisters despite being close when they were younger.  Current Symptoms: Depressed Mood and Irritability  Psychiatric Specialty Exam: General Appearance: Casual  Eye Contact:  Good  Speech:  Clear and Coherent  Volume:  Normal  Mood:  Dysphoric  Affect:  Appropriate and Congruent  Thought Process:  Coherent  Orientation:  Full (Time, Place, and Person)  Thought Content:  Logical  Suicidal Thoughts:  No  Homicidal Thoughts:  No  Memory:  Immediate;   Fair Recent;   Fair Remote;   Good  Judgement:  Good  Insight:  Good  Psychomotor Activity:  Normal  Concentration:  Concentration: Fair subjectively, patient endorses that this is poor  Recall:  Poor  Fund of Knowledge:Good  Language: Good  Akathisia:  NA    AIMS (if indicated):  not done  Assets:  Communication Skills Desire for Improvement Housing Resilience Social Support  ADL's:  Intact  Cognition: Impaired,  Mild unable to do MoCA due to poor eyesight  Sleep:  Fair     Diagnosis:  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 complicated grief reaction  Anticipated Frequency  of Visits: Monthly Anticipated Length of Treatment Episode: 10 months  Short Term Goals/Goals for Treatment Session:  Wife is very supportive and endorses that she will continue to make sure the patient attempts to follow through with his goals.  Patient does endorse having poor memory, patient's wife takes notes throughout assessment for both to look back on over the next few weeks.  Goal 1-call wife's neighbor, Mr. Annia Friendly, at least 1 time a week the next month.  Patient is already visiting him 1 time a week when he goes to  see his wife.  Mr. Annia Friendly, is a friend who puts less pressure on patient to get out and do things and has a good "first step" becoming more social as patient used to be in the past.  Goal 2: Patient will attend Wednesday night Bible study with his wife, to begin work on being more social but also spending more time with his wife in a social manner.  Progress Towards Goals: Progressing Patient endorses that his wife is already trying to mentally prepare him to go to Bible study, and he thinks he will be able to attend tomorrow.  Treatment Intervention: Behavior modification and Psychoeducation  Patient was also educated about his diagnoses and how strokes played a role depression.  Patient was also educated about grief and how an individual may have to learn how to cope with grief and different manners such as finding other ways to "honor" the person's memory or relationship.  Medical Necessity: Improved patient condition patient's PHQ-9 is significantly improved.  Assessment Tools:    02/19/2022    4:15 PM 01/21/2022   10:15 AM 03/21/2016   10:12 AM  Depression screen PHQ 2/9  Decreased Interest 3 2 1   Down, Depressed, Hopeless 3 3 2   PHQ - 2 Score 6 5 3   Altered sleeping 0 3 3  Tired, decreased energy 3 3 3   Change in appetite 0 2 1  Feeling bad or failure about yourself  2 3 2   Trouble concentrating 3 3 1   Moving slowly or fidgety/restless 0 3 2  Suicidal thoughts 1 3 0  PHQ-9 Score 15 25 15   Difficult doing work/chores  Somewhat difficult Somewhat difficult   No flowsheet data found. Flowsheet Row Counselor from 01/21/2022 in [facility] ED to Hosp-Admission (Discharged) from 10/31/2021 in [facility] ED to Hosp-Admission (Discharged) from 10/20/2021 in [place] 01/23/2022 Progressive [facility]  C-SSRS RISK CATEGORY Error: Q3, 4, or 5 should not be populated when Q2 is No No Risk No Risk       Collaboration of Care:   Patient/Guardian was  advised Release of Information must be obtained prior to any record release in order to collaborate their care with an outside provider. Patient/Guardian was advised if they have not already done so to contact the registration department to sign all necessary forms in order for [facility] to release information regarding their care.   Consent: Patient/Guardian gives verbal consent for treatment and assignment of benefits for services provided during this visit. Patient/Guardian expressed understanding and agreed to proceed.   Plan:  -Patient is to attempt to work on the 2 action items mentioned in the short term goals. - At next appointment: Begin to abuse in patient's depression with his history of complicated grief response, also explained to patient the concept of ruminating on thoughts despite the conscious attempt to not think about them, honoring the memories of people from his past, and understanding  the grief response is something that humans must learn as we develop and that by not developing the skills they are fortunately likely to lead to repeat poor responses. 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

## 2022-03-01 ENCOUNTER — Ambulatory Visit (HOSPITAL_COMMUNITY)
Admission: RE | Admit: 2022-03-01 | Discharge: 2022-03-01 | Disposition: A | Discharge status: Home / Self Care | Payer: Medicare HMO | Source: Ambulatory Visit | Attending: Internal Medicine | Admitting: Internal Medicine

## 2022-03-01 DIAGNOSIS — I728 Aneurysm of other specified arteries: Secondary | ICD-10-CM | POA: Diagnosis present

## 2022-03-01 MED ORDER — IOHEXOL 350 MG/ML SOLN
100.0000 mL | Freq: Once | INTRAVENOUS | Status: AC | PRN
  Administered 2022-03-01: 100 mL via INTRAVENOUS

## 2022-03-07 NOTE — Progress Notes (Signed)
Carelink Summary Report / Loop Recorder 

## 2022-03-11 LAB — CUP PACEART REMOTE DEVICE CHECK
Date Time Interrogation Session: 20230831200106
Implantable Pulse Generator Implant Date: 20230417

## 2022-03-12 ENCOUNTER — Ambulatory Visit (INDEPENDENT_AMBULATORY_CARE_PROVIDER_SITE_OTHER): Payer: Medicare HMO

## 2022-03-12 DIAGNOSIS — I639 Cerebral infarction, unspecified: Secondary | ICD-10-CM

## 2022-03-15 ENCOUNTER — Telehealth: Payer: Self-pay

## 2022-03-15 MED ORDER — CLOPIDOGREL BISULFATE 75 MG PO TABS: 75.0000 mg | ORAL_TABLET | Freq: Every day | ORAL | 3 refills | Status: DC

## 2022-03-15 NOTE — Telephone Encounter (Signed)
Spoke with pt and pt's wife, DPR and advised of Dr Odessa Fleming results as below.  Pt and pt's wife verbalizes understanding and agrees with current plan.  Plavix 75mg  - 1 tablet by mouth daily sent to pharmacy requested by pt.  Pt verbalizes understanding and agrees with current plan.

## 2022-03-15 NOTE — Telephone Encounter (Signed)
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

## 2022-03-26 ENCOUNTER — Ambulatory Visit (INDEPENDENT_AMBULATORY_CARE_PROVIDER_SITE_OTHER): Payer: Medicare HMO | Admitting: Student in an Organized Health Care Education/Training Program

## 2022-03-26 ENCOUNTER — Encounter (HOSPITAL_COMMUNITY): Payer: Self-pay | Admitting: Student in an Organized Health Care Education/Training Program

## 2022-03-26 VITALS — BP 121/72 | HR 88 | Resp 24

## 2022-03-26 DIAGNOSIS — F17200 Nicotine dependence, unspecified, uncomplicated: Secondary | ICD-10-CM | POA: Diagnosis not present

## 2022-03-26 DIAGNOSIS — F332 Major depressive disorder, recurrent severe without psychotic features: Secondary | ICD-10-CM

## 2022-03-26 MED ORDER — TRAZODONE HCL 50 MG PO TABS: 50.0000 mg | ORAL_TABLET | Freq: Every day | ORAL | 2 refills | Status: DC

## 2022-03-26 MED ORDER — SERTRALINE HCL 50 MG PO TABS: 150.0000 mg | ORAL_TABLET | Freq: Every day | ORAL | 2 refills | Status: DC

## 2022-03-26 MED ORDER — GABAPENTIN 300 MG PO CAPS: 300.0000 mg | ORAL_CAPSULE | Freq: Two times a day (BID) | ORAL | 2 refills | Status: DC

## 2022-03-26 NOTE — Progress Notes (Signed)
[place] [address] Dept: [phone] Dept Fax: [phone]  Psychotherapy Progress Note  Patient ID: Jerry [name], male  DOB: 04-02-1956, 66 y.o.  MRN: [id number]  03/26/2022 Start time: 2:12 End time: 3:10  Method of Visit: Face-to-Face  Present: family/ wife  Current Concerns: Irritability and tobacco use, relapse  Current Symptoms: Anger and Irritability  1.  Mood:-"Okay."  Patient denies SI, HI and AVH.  Patient reports that his sleep is improving but he is still waking up at night with pain.  Patient reports he prefers to rest on the couch instead of in his bed due to the pain.  2.  Medications: Patient provider spoke about adding 300 mg gabapentin and titrating up to 600 mg after 1 week for anxiety, cravings, and neuropathic pain.  Also spoke with patient about increasing patient's Zoloft as patient appears to be benefiting due to decreased dysphoric mood leading to less isolative behaviors and less anhedonia however he continues to endorse irritability and anger as well as newly endorsing anxiety.  Patient endorses trazodone 50 mg nightly is helping initiate sleep.  3.  Update: Patient reports that he accomplish all of his goals including calling and visiting his friend Mr. Chester.  Patient reports he has been attending Bible study with his wife and has begun attending Fellowship gatherings with his church.  Patient endorses that he has become more social lately and has even begun visiting the grandchildren which he finds a lot of joy in.  Patient reports that he feels he is "gotten out of that slump where I did not want to be around people."  Patient reports that he is struggling with irritability and anger towards his ailing health, frequent doctor's visits, frustration and feeling overwhelmed by constantly having to wait for new procedures/answers and hearing from insurance.  Wife-reports  that patient started smoking in June and she is becoming increasingly concerned by this behavior as it increases patient risk for repeat stroke or worse.  [name] reports that she has endorses concern the patient but he continues to smoke 1 pack over approximately 4 days, and wife is concerned that this is a negative coping skill.  Patient responded to these concerns by endorsing that he is in fact using cigarettes as a negative coping skill, "I thought it be alcohol or cigarettes and alcohol would be worse."  Patient reports that he was triggered to start smoking again after the loss of his best friend in 6/23.  Patient endorsed that he felt anger when his friend died.  Patient endorses that he is aware that his cigarettes increased risk for stroke and that he will have to start using new coping skills when dealing with loss and grief.  4.  Goals/agenda: Patient reports he would like to spend today addressing stages of grief as they relate to his ailing health.  Provider and patient went through the 5 stages of grief: Denial, anger, bargaining, depression and acceptance.  Together patient and provider identified the patient is currently between depression and anger in regards to his health.  Patient and provider discussed anger as often a sign of sadness and the patient does realize this.  Also spoke with patient about looking at the "blessings" in his life such as "having insurance to see physicians, having access to the specialist, having support in his life.  Patient endorsed that he has been spending some time each day praying and that this has helped him  see more positives in his life and release some of his stress and anger.  Patient reports that he would like to continue to practice his daily prayers and meditation time.  Patient reports that he would also like to help out around the home by "doing the dishes at least 3 times per week, vacuuming at least 2 times a month and spending more time with his wife at  least 2 times a month.  In regards to patient's cigarette use, again reviewed with patient the stages of grief and identifying his emotions and recognizing that staying angry or sad for too long will cause patient more harm.  Also asked that patient write out any benefits he believes he is getting from smoking as well as any negatives.  Did give patient praises for coming to acceptance with memory deficits since his stroke, and attempting to accommodate for this in his life, as well as accomplishing all of the goals he set in more from his last visit. Psychiatric Specialty Exam: General Appearance: Casual  Eye Contact:  Good  Speech:  Clear and Coherent  Volume:  Normal  Mood:  Irritable, objectively euthymic  Affect:  Restricted  Thought Process:  Coherent  Orientation:  Full (Time, Place, and Person)  Thought Content:  Logical  Suicidal Thoughts:  No  Homicidal Thoughts:  No  Memory:  Immediate;   Fair Recent;   Poor Remote;   Poor  Judgement:  Fair  Insight:  Shallow  Psychomotor Activity:  Decreased  Concentration:  Concentration: Fair  Recall:  NA  Fund of Knowledge:Fair  Language: Good  Akathisia:  NA    AIMS (if indicated):  not done  Assets:  Communication Skills Desire for Improvement Financial Resources/Insurance Housing Resilience Social Support  ADL's:  Intact  Cognition: WNL  Sleep:  Fair     Diagnosis:  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 complicated grief reaction  Anticipated Frequency of Visits: Monthly Anticipated Length of Treatment Episode: 8 months  Short Term Goals/Goals for Treatment Session:  -Patient reports that he would like to continue to practice his daily prayers and meditation time.  Patient reports that he would also like to help out around the home by "doing the dishes at least 3 times per week, vacuuming at least 2 times a month and spending more time with his wife at least 2 times a month.  -Write out any benefits  he believes he is getting from smoking as well as any negatives.  -Decrease and eventually stop smoking   Progress Towards Goals: Progressing  Patient did meet some of his short term goals  Treatment Intervention: Cognitive Behavioral therapy and Psychoeducation  Medical Necessity: Improved patient condition  Assessment Tools:    02/19/2022    4:15 PM 01/21/2022   10:15 AM 03/21/2016   10:12 AM  Depression screen PHQ 2/9  Decreased Interest Down, Depressed, Hopeless PHQ - 2 Score Altered sleeping 0 3 3  Tired, decreased energy Change in appetite 0 2 1  Feeling bad or failure about yourself  Trouble concentrating Moving slowly or fidgety/restless 0 3 2  Suicidal thoughts 1 3 0  PHQ-9 Score Difficult doing work/chores  Somewhat difficult Somewhat difficult   No flowsheet data found. Flowsheet Row Counselor from 01/21/2022 in [facility] ED to Hosp-Admission (Discharged) from 10/31/2021  in [place] [facility] ED to Hosp-Admission (Discharged) from 10/20/2021 in [place] [facility]  C-SSRS RISK CATEGORY Error: Q3, 4, or 5 should not be populated when Q2 is No No Risk No Risk       Collaboration of Care:   Patient/Guardian was advised Release of Information must be obtained prior to any record release in order to collaborate their care with an outside provider. Patient/Guardian was advised if they have not already done so to contact the registration department to sign all necessary forms in order for [place] to release information regarding their care.   Consent: Patient/Guardian gives verbal consent for treatment and assignment of benefits for services provided during this visit. Patient/Guardian expressed understanding and agreed to proceed.   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

## 2022-03-26 NOTE — Progress Notes (Signed)
I have discussed medication management in this case with Dr. Candie Chroman, and I am in agreement with the assessment and plan.  Please see further attestation for therapy component.  Lavella Hammock, MD

## 2022-04-03 NOTE — Progress Notes (Signed)
Carelink Summary Report / Loop Recorder 

## 2022-04-10 LAB — CUP PACEART REMOTE DEVICE CHECK
Date Time Interrogation Session: 20231003200131
Implantable Pulse Generator Implant Date: 20230417

## 2022-04-15 ENCOUNTER — Ambulatory Visit (INDEPENDENT_AMBULATORY_CARE_PROVIDER_SITE_OTHER): Payer: Medicare HMO

## 2022-04-15 DIAGNOSIS — I639 Cerebral infarction, unspecified: Secondary | ICD-10-CM | POA: Diagnosis not present

## 2022-04-22 NOTE — Progress Notes (Signed)
Carelink Summary Report / Loop Recorder 

## 2022-04-29 ENCOUNTER — Ambulatory Visit (INDEPENDENT_AMBULATORY_CARE_PROVIDER_SITE_OTHER): Payer: Medicare HMO | Admitting: Student in an Organized Health Care Education/Training Program

## 2022-04-29 VITALS — BP 148/85 | HR 94 | Resp 12 | Wt 280.0 lb

## 2022-04-29 DIAGNOSIS — F3341 Major depressive disorder, recurrent, in partial remission: Secondary | ICD-10-CM

## 2022-04-29 DIAGNOSIS — F332 Major depressive disorder, recurrent severe without psychotic features: Secondary | ICD-10-CM

## 2022-04-29 MED ORDER — SERTRALINE HCL 50 MG PO TABS: 150.0000 mg | ORAL_TABLET | Freq: Every day | ORAL | 2 refills | Status: DC

## 2022-04-29 MED ORDER — GABAPENTIN 300 MG PO CAPS: 300.0000 mg | ORAL_CAPSULE | Freq: Two times a day (BID) | ORAL | 2 refills | Status: DC

## 2022-04-29 MED ORDER — TRAZODONE HCL 50 MG PO TABS: 50.0000 mg | ORAL_TABLET | Freq: Every day | ORAL | 2 refills | Status: DC

## 2022-04-29 NOTE — Progress Notes (Signed)
[place] [address] Dept: [phone] Dept Fax: [phone]  Psychotherapy Progress Note  Patient ID: Jerry [name], male  DOB: 1956/05/26, 66 y.o.  MRN: [id number]  04/29/2022 Start time: 2:03 End time: 3:06 Dr. Eloise Harman was supervising in person.   Method of Visit: Face-to-Face  Present:  Wife  Current Concerns: Patient mom newly dx with metastatic cancer  Current Symptoms: Family Stress   Mood: "Pretty good." Patient denies SI, HI, and AVH. Wife reports that she also feels patient has been doing well.    Medications: Patient endorses feeling that his medications are beneficial and denies any adverse side effects. Patient reports he is compliant with Zoloft 170m daily . Patient reports that he did not receive his Gabapentin until last week, but since then has been doing well. Patient endorses that he also continues to take his trazodone 534mQHS.   3. Update: Patient reports he has been more active in the church; although he was invited for a [employer] chose not to go because he did not feel up to socializing. Patient reports in general he continues to attend bible study and feels he has a good support system in his wife and church family. Patient endorses that he feels like he is social. Patient reports that he has been less impulsive lately. Patient reports that when he does get triggered by certain things, he will listen to his wife or daughter who ask him to calm down, and think about how his actions could negatively impact him and them. Patient reports that this has worked in instead he has been going to bible studies and relying more on God to handle his stressors.   Patient's wife reports a clear of example of this improvement was seen, when he received an abrupt call from his sister who located in town. Patient reports that the day before receiving the call he had tied to speak to  this same sister over the phone, but she was curt and told him she was taking their mother to the hospital. Patient was upset, but decided against rushing out the door to confront his sister and went to bible study instead. Patient reports that he found bible study provided a good support group, but also led to him feeling less irritable and anxious. Patient reports that the next day he received a call from another sister that their mother had metastatic cancer. Patient reports that he and his wife were in shock, because they live in the same town as the mother, and were unaware she had cancer. Patient reports that the sister he had tried to call and the sister who called him are his mother's POA's. Patient reports he was upset with how the situation was handled, and later found out that some of his other siblings were in the same position as him. Patient reports that he decided to not create more "drama" and instead has been trying to spend time with his mother. Patient reports that this is his goal, as this is his"need" as he prepares to say goodbye to this mother. Patient wife reports that patient's mother is [phone] and patient discuss patient's hx of loss and his grieving. Patient reports that this time he feels "stronger" because he feel she has support, and some knowledge this his mother will pass. Provider asks patient to think about how he imagines himself grieving his mother. Patient reports he see's himself a bit more joyful  than his past grief experiences, because he knows his mother was loved and "God-fearing."   Patient endorses that he is also practicing boundary setting, and positive selfishness, by practicing behaviors that will decrease discord with his sister's despite not always agreeing with their choices. Patient  Agenda: Today, was spent applauding the positive behavior changes that patient has demonstrated as well as learning how to apply them to impending circumstances (mom's  predicted death). Patient was able to identify the advantages of his behavior changes.   Psychiatric Specialty Exam: General Appearance: Casual  Eye Contact:  Good  Speech:  Clear and Coherent  Volume:  Normal  Mood:  Euthymic  Affect:  Appropriate  Thought Process:  Coherent  Orientation:  Full (Time, Place, and Person)  Thought Content:  Logical  Suicidal Thoughts:  No  Homicidal Thoughts:  No  Memory:  Immediate;   Good Recent;   Good Remote;   Good  Judgement:  Good  Insight:  Fair  Psychomotor Activity:  Normal  Concentration:  Concentration: Fair  Recall:  Poor  Fund of Knowledge:Fair  Language: Good  Akathisia:  No  Handed:    AIMS (if indicated):  not done  Assets:  Communication Skills Desire for Improvement Housing Intimacy Leisure Time Resilience Social Support Transportation Vocational/Educational  ADL's:  Intact  Cognition: Impaired,  Mild  Sleep:  Fair     Diagnosis: MDD, in partial remission  Anticipated Frequency of Visits: Monthly Anticipated Length of Treatment Episode: 7 mon  Short Term Goals/Goals for Treatment Session: Patient is attending Bible study and interacting with others as prior to his stroke  Patient PHQ-9 has decreased by at least 50%  Progress Towards Goals: Met  Treatment Intervention: Behavior therapy, Cognitive therapy, and Desensitization  Medical Necessity: Improved patient condition  Assessment Tools:    04/29/2022    2:49 PM 02/19/2022    4:15 PM 01/21/2022   10:15 AM  Depression screen PHQ 2/9  Decreased Interest _0 Down, Depressed, Hopeless _1 PHQ - 2 Score _2 Altered sleeping 3 0 3  Tired, decreased energy _3 Change in appetite 0 0 2  Feeling bad or failure about yourself  0 2 3  Trouble concentrating _4 Moving slowly or fidgety/restless 0 0 3  Suicidal thoughts 0 1 3  PHQ-9 Score _5 Difficult doing work/chores   Somewhat difficult   No flowsheet data found. Flowsheet Row  Counselor from 01/21/2022 in [facility] ED to Hosp-Admission (Discharged) from 10/31/2021 in [place] ED to Hosp-Admission (Discharged) from 10/20/2021 in [place] [facility]  C-SSRS RISK CATEGORY Error: Q3, 4, or 5 should not be populated when Q2 is No No Risk No Risk       Collaboration of Care:   Patient/Guardian was advised Release of Information must be obtained prior to any record release in order to collaborate their care with an outside provider. Patient/Guardian was advised if they have not already done so to contact the registration department to sign all necessary forms in order for [place] to release information regarding their care.   Consent: Patient/Guardian gives verbal consent for treatment and assignment of benefits for services provided during this visit. Patient/Guardian expressed understanding and agreed to proceed.   Plan: Will f/u in approx 1 mon due to patient impending loss, and hx of complex grief response  MDD, currently in remission -  Continue Zoloft 185m daily - Continue Trazodone 521mQHS PRN  Tobacco use disorder Continue Gabapentin 30019mID  PGY-3 JaiFreida BusmanD 04/29/2022

## 2022-05-01 ENCOUNTER — Telehealth (HOSPITAL_COMMUNITY): Payer: Self-pay | Admitting: *Deleted

## 2022-05-01 NOTE — Telephone Encounter (Signed)
Prior Authorization received for patietns sertraline 150 mg daily. Approved thru [facility] # [id number] and its effective till 07/07/22. Pharmacy notified.

## 2022-05-20 ENCOUNTER — Ambulatory Visit (INDEPENDENT_AMBULATORY_CARE_PROVIDER_SITE_OTHER): Payer: Medicare HMO

## 2022-05-20 DIAGNOSIS — I639 Cerebral infarction, unspecified: Secondary | ICD-10-CM | POA: Diagnosis not present

## 2022-05-21 LAB — CUP PACEART REMOTE DEVICE CHECK
Date Time Interrogation Session: 20231112231810
Implantable Pulse Generator Implant Date: 20230417

## 2022-06-04 ENCOUNTER — Ambulatory Visit: Payer: Medicare HMO | Attending: Internal Medicine | Admitting: Internal Medicine

## 2022-06-04 VITALS — BP 140/78 | HR 108 | Ht 76.0 in | Wt 281.6 lb

## 2022-06-04 DIAGNOSIS — Q254 Congenital malformation of aorta unspecified: Secondary | ICD-10-CM

## 2022-06-04 MED ORDER — ASPIRIN 81 MG PO TBEC: 81.0000 mg | DELAYED_RELEASE_TABLET | Freq: Every day | ORAL | Status: AC

## 2022-06-04 MED ORDER — CLOPIDOGREL BISULFATE 75 MG PO TABS: 75.0000 mg | ORAL_TABLET | Freq: Every day | ORAL | 3 refills | Status: AC

## 2022-06-04 NOTE — Patient Instructions (Addendum)
Medication Instructions:   No Changes In Medications at this time.  CONTINUE PLAVIX (CLOPIDOGREL) AND ASPIRIN   *If you need a refill on your cardiac medications before your next appointment, please call your pharmacy*  Lab Work: None Ordered At This Time.  If you have labs (blood work) drawn today and your tests are completely normal, you will receive your results only by: MyChart Message (if you have MyChart) OR A paper copy in the mail If you have any lab test that is abnormal or we need to change your treatment, we will call you to review the results.  Testing/Procedures: None Ordered At This Time.   Follow-Up: At [facility], you and your health needs are our priority.  As part of our continuing mission to provide you with exceptional heart care, we have created designated Provider Care Teams.  These Care Teams include your primary Cardiologist (physician) and Advanced Practice Providers (APPs -  Physician Assistants and Nurse Practitioners) who all work together to provide you with the care you need, when you need it.    Your next appointment:   6 month(s)  The format for your next appointment:   In Person  Provider:   Maisie Fus, MD     Other Instructions REFERRAL TO VASCULAR SURGERY- SOMEONE WILL REACH OUT TO YOU REGARDING THIS

## 2022-06-04 NOTE — Progress Notes (Signed)
Cardiology Office Note:    Date:  06/04/2022   ID:  ROGUE RAFALSKI, DOB 06-02-1956, MRN [id number]  PCP:  Mattie Marlin, DO   [facility] Providers Cardiologist:  Maisie Fus, MD     Referring MD: Mattie Marlin, DO   No chief complaint on file. Stroke  History of Present Illness:    Jerry [name] is a 66 y.o. 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 Presented with worsening lower extremity weakness and ataxia. MR brain with acute/subacute nonhemorrhagic infarct involving the right caudate head, right middle lobe, right frontal lobe. TTE did not show PFO with agitated bubble study.  A loop recorder placed was inserted by Dr. Graciela Husbands. He presented again with tingling and numbness of the left side of the face. His loop recorder was interrogated and did not show afib. He had an MRI brain showed which showed features concerning for possible embolic stroke involving the right cerebral hemisphere. He underwent TEE. Did not show significant PFO. Showed significant atheroma in the thoracic aorta. Managed with asa/brilinta for 4 weeks and then asa alone.  No evidence of afib. He was also enrolled in a stroke study above.  Interim Hx 12/04/2021:   He stopped smoking after smoking since he was a teenager! Blood pressures at home are well controlled. He feels well with no significant deficits. He comes in with his wife. Prior to his stroke they have endured a lot of stress. Multiple family members passed away. Also, his wife was in a nursing facility for four years.  Cardiac studies: Cath 2014- non obstructive dx. Indication abnormal myoview. TTE 10/21/2021- normal study.    Past Medical History:  Diagnosis Date   Arthritis    Diabetes mellitus 2008   type II   Dyslipidemia    Erectile dysfunction     History of cardiac catheterization 06/2013   Dr. Peter Swaziland   HTN (hypertension)    Hypogonadism male     Past Surgical History:  Procedure Laterality Date   APPENDECTOMY     BUBBLE STUDY  11/02/2021   Procedure: BUBBLE STUDY;  Surgeon: Maisie Fus, MD;  Location: [facility] ENDOSCOPY;  Service: Cardiovascular;;   COLONOSCOPY  08/2006   Dr. Russella Dar - repeat 2018   INCISION AND DRAINAGE PERIRECTAL ABSCESS     KNEE ARTHROSCOPY Left 12/28/2014   Procedure: ARTHROSCOPY LEFT KNEE WITH  MEDIAL MENSICUS DEBRIDEMENT;  Surgeon: Ollen Gross, MD;  Location: [facility] ORS;  Service: Orthopedics;  Laterality: Left;   KNEE SURGERY     left knee - remote past   LEFT HEART CATHETERIZATION WITH CORONARY ANGIOGRAM N/A 06/11/2013   Procedure: LEFT HEART CATHETERIZATION WITH CORONARY ANGIOGRAM;  Surgeon: Peter M Swaziland, MD;  Location: [facility] CATH LAB;  Service: Cardiovascular;  Laterality: N/A;   LOOP RECORDER INSERTION N/A 10/22/2021   Procedure: LOOP RECORDER INSERTION;  Surgeon: Duke Salvia, MD;  Location: [facility] INVASIVE CV LAB;  Service: Cardiovascular;  Laterality: N/A;   right arm and elbow surgery     TEE WITHOUT CARDIOVERSION N/A 11/02/2021   Procedure: TRANSESOPHAGEAL ECHOCARDIOGRAM (TEE);  Surgeon: Maisie Fus, MD;  Location: [facility] ENDOSCOPY;  Service: Cardiovascular;  Laterality: N/A;   TONSILLECTOMY     TOTAL KNEE ARTHROPLASTY Left 10/04/2015   Procedure: LEFT TOTAL KNEE ARTHROPLASTY right knee  aspiration and injection;  Surgeon: Ollen Gross, MD;  Location: [facility] ORS;  Service: Orthopedics;  Laterality: Left;   TOTAL KNEE ARTHROPLASTY Right 11/04/2016   Procedure: RIGHT TOTAL KNEE ARTHROPLASTY;  Surgeon: Ollen Gross, MD;  Location: [facility] ORS;  Service: Orthopedics;  Laterality: Right;  with abductor block   wisdom teeth extractions      Current Medications: Current Outpatient Medications on File Prior to Visit  Medication Sig Dispense Refill   acetaminophen (TYLENOL) 500 MG tablet Take 1,000 mg by mouth  every 6 (six) hours as needed for mild pain.     amLODipine (NORVASC) 10 MG tablet Take 1 tablet (10 mg total) by mouth daily. 30 tablet 2   Blood Glucose Monitoring Suppl (GLUCOCOM BLOOD GLUCOSE MONITOR) DEVI Please check blood sugar 5 times per day due to fluctuating blood sugars.     docusate sodium (COLACE) 100 MG capsule Take 1 capsule (100 mg total) by mouth every 12 (twelve) hours. (Patient taking differently: Take 100 mg by mouth 2 (two) times daily as needed for moderate constipation.) 30 capsule 0   ezetimibe (ZETIA) 10 MG tablet Take 1 tablet (10 mg total) by mouth daily. 90 tablet 3   gabapentin (NEURONTIN) 300 MG capsule Take 1 capsule (300 mg total) by mouth 2 (two) times daily. Take 300 mg ( 1 capsule) x 7 days the increase to 300mg  2x/day 60 capsule 2   glucose blood test strip 2 times per day     ibuprofen (ADVIL) 200 MG tablet Take 400 mg by mouth every 6 (six) hours as needed for headache or moderate pain.     Insulin Glargine (LANTUS SOLOSTAR) 100 UNIT/ML Solostar Pen Inject 15 Units into the skin 2 (two) times daily. (Patient taking differently: Inject 15 Units into the skin at bedtime.) 15 mL 3   melatonin 5 MG TABS Take 5 mg by mouth at bedtime as needed (sleep).     metFORMIN (GLUCOPHAGE) 1000 MG tablet Take 1 tablet (1,000 mg total) by mouth daily. (Patient taking differently: Take 1,000 mg by mouth 2 (two) times daily with a meal.) 180 tablet 0   Multiple Vitamins-Minerals (CENTRUM SILVER 50+MEN) TABS Take 1 tablet by mouth daily.     rosuvastatin (CRESTOR) 40 MG tablet Take 1 tablet (40 mg total) by mouth daily. 30 tablet 2   sertraline (ZOLOFT) 50 MG tablet Take 3 tablets (150 mg total) by mouth daily. 90 tablet 2   Study - OCEANIC-STROKE - asundexian 50 mg or placebo tablet (PI-Sethi) Take 1 tablet (50 mg total) by mouth daily. For Investigational Use Only. Take at the same time each day (preferably in the morning). Tablet should be swallowed whole with water; it CANNOT be  crushed or broken. Please contact [facility] if you have any questions regarding this medication or study. 1 tablet 0   traZODone (DESYREL) 50 MG tablet Take 1 tablet (50 mg total) by mouth at bedtime. 30 tablet 2   No current facility-administered medications on file prior to visit.     Allergies:   Diclofenac sodium, Lisinopril, and Losartan   Social History   Socioeconomic History   Marital status: Married    Spouse name: Not on file   Number of children: Not on file   Years of education: Not on file   Highest education level: Not on file  Occupational History   Occupation: retired    Comment: prior physical therapist  Tobacco Use   Smoking status: Every Day  Types: Cigarettes    Last attempt to quit: 11/11/2001    Years since quitting: 20.5   Smokeless tobacco: Never   Tobacco comments:    Started smoking again, 1 pack every 4 days  Substance and Sexual Activity   Alcohol use: No   Drug use: No   Sexual activity: Never  Other Topics Concern   Not on file  Social History Narrative   Married 22 years, 2 daughter and 1 son. Retired, used to work as a PT.  Exercises treadmill daily, some weight bearing exercise. Diet    Social Determinants of Health   Financial Resource Strain: Not on file  Food Insecurity: Not on file  Transportation Needs: Not on file  Physical Activity: Not on file  Stress: Not on file  Social Connections: Not on file     Family History: The patient's family history includes Diabetes in his mother; Other in his brother and father. There is no history of Stroke, Heart disease, Cancer, or Hypertension.  ROS:   Please see the history of present illness.  All other systems reviewed and are negative.  EKGs/Labs/Other Studies Reviewed:    The following studies were reviewed today:   EKG:  EKG is  ordered today.  The ekg ordered today demonstrates   NSR  Recent Labs: 11/01/2021: ALT 17 11/02/2021: Hemoglobin 13.1; Magnesium  2.6; Platelets 231 02/05/2022: BUN 12; Creatinine, Ser 0.89; Potassium 4.1; Sodium 141   Recent Lipid Panel    Component Value Date/Time   CHOL 163 10/21/2021 0406   TRIG 84 10/21/2021 0406   HDL 58 10/21/2021 0406   CHOLHDL 2.8 10/21/2021 0406   VLDL 17 10/21/2021 0406   LDLCALC 88 10/21/2021 0406     Risk Assessment/Calculations:           Physical Exam:    VS:  There were no vitals filed for this visit.    Wt Readings from Last 3 Encounters:  01/21/22 266 lb 9.6 oz (120.9 kg)  12/04/21 257 lb 12.8 oz (116.9 kg)  12/04/21 258 lb 12.8 oz (117.4 kg)     GEN:  Well nourished, well developed in no acute distress HEENT: Normal NECK: No JVD; No carotid bruits LYMPHATICS: No lymphadenopathy CARDIAC: RRR, no murmurs, rubs, gallops RESPIRATORY:  Clear to auscultation without rales, wheezing or rhonchi  ABDOMEN: Soft, non-tender, non-distended MUSCULOSKELETAL:  No edema; No deformity  SKIN: Warm and dry NEUROLOGIC:  Alert and oriented x 3 PSYCHIATRIC:  Normal affect   ASSESSMENT:    Ischemic CVA/Aortic Atheroma w/ thrombus: TEE did not show significant PFO. He has grade IV atheroma , this was possibly the embolic source. LDL goal < 70 mg/dL; LDL is 88. He stopped smoking which is great. A1c goal < 7. His blood pressure is well managed.  Saw Dr. Graciela Husbands and noted subclavian bruit, found to have atherosclerotic plaque in the aortic isthmus with an associated tongue of wall adherent thrombus measuring 6 x 4mm protruding into the aortic lumen. No obstruction in the subclavian. He was started on plavix. He is continued on crestor 40 mg daily and stared zetia with LDL not at goal. LDL 54 mg/dL 2/44/0102. Will continue his DAPT. Will refer to vascular sx considering the location of his atheroma/thrombus. A clotting disorder is also possible. Will review with vascular sx first after discussion today with Dr. Graciela Husbands  Loop recorder: referral to EP for consideration of removal, decided to  continue with multiple strokes. Placed 10/22/2021. No afib on prior interrogations  HTN: ok control. Continue his current regimen  PLAN:    In order of problems listed above:   Referral to vascular sx 2/2 significant aortic atheroma and thrombus/ recommendation for antiplatelet, AC? Consideration of hypercoag w/u?  Removed from [facility] study, trial was stopped nationally 2/2 inferiority to eliquis  Follow up in 6 months      Medication Adjustments/Labs and Tests Ordered: Current medicines are reviewed at length with the patient today.  Concerns regarding medicines are outlined above.  No orders of the defined types were placed in this encounter.  No orders of the defined types were placed in this encounter.   There are no Patient Instructions on file for this visit.   Signed, Maisie Fus, MD  06/04/2022 2:08 PM    [facility]

## 2022-06-12 ENCOUNTER — Ambulatory Visit (HOSPITAL_COMMUNITY): Payer: Medicare HMO | Admitting: Student in an Organized Health Care Education/Training Program

## 2022-06-13 ENCOUNTER — Ambulatory Visit (INDEPENDENT_AMBULATORY_CARE_PROVIDER_SITE_OTHER): Payer: Medicare HMO | Admitting: Student in an Organized Health Care Education/Training Program

## 2022-06-13 ENCOUNTER — Encounter (HOSPITAL_COMMUNITY): Payer: Self-pay | Admitting: Student in an Organized Health Care Education/Training Program

## 2022-06-13 VITALS — BP 152/84 | HR 95 | Resp 20 | Wt 282.0 lb

## 2022-06-13 DIAGNOSIS — F332 Major depressive disorder, recurrent severe without psychotic features: Secondary | ICD-10-CM

## 2022-06-13 DIAGNOSIS — F3341 Major depressive disorder, recurrent, in partial remission: Secondary | ICD-10-CM

## 2022-06-13 MED ORDER — GABAPENTIN 300 MG PO CAPS: 300.0000 mg | ORAL_CAPSULE | Freq: Three times a day (TID) | ORAL | 2 refills | Status: DC

## 2022-06-13 MED ORDER — SERTRALINE HCL 50 MG PO TABS: 150.0000 mg | ORAL_TABLET | Freq: Every day | ORAL | 2 refills | Status: DC

## 2022-06-13 MED ORDER — TRAZODONE HCL 50 MG PO TABS: 50.0000 mg | ORAL_TABLET | Freq: Every day | ORAL | 2 refills | Status: DC

## 2022-06-13 NOTE — Progress Notes (Signed)
[place] [address] Dept: [phone] Dept Fax: [phone]  Psychotherapy Progress Note  Patient ID: Jerry [name], male  DOB: [birth year], 66 y.o.  MRN: [id number]  06/13/2022 Start time: 9:43 End time: 10:30  Method of Visit: Face-to-Face  Present: family, wife  Current Concerns: Mother died, paternal cousin he was close with died, wife's father of her son died (they were also close), daughter whom he lives with husband left her abruptly, son briefly went to jail  Current Symptoms: Family Stress  Mood: "Somber" Patient reports his appetite is "good" and is sleeping a bit better. Patient report he is now able to get at least 4 h in bed before he moves to the couch. Patient denies SI, HI, and AVH. Patient does not mention ill thoughts toward the father of his deceased granddaughter today.   Medications: Patient endorses compliance and no adverse side effects.  Update/ Agenda: Patient endorses multiple losses including the expected loss of his mother. Patient reports that losing her was "especially devastating" and that he did isolate after her passing, but later reports that after his daughters troubles, he began to reach out to her more, to let her know he had her support. He reports that he continued his exercising and reading the bible in the AM, but did stop going to church and related activities. Patient reports he has already set the goal to go back on Sunday, and endorsed that he recognized that he was just not able to handle the constant questions he would get about his wellbeing if he had been going. Patient reports that has "let it go" to God and will be attending church on Sunday to celebrate his mother. Patient reports that he has decided that he is responsible for his own "joy" and will not allow negative feelings to completely derail him.   Wife interjects that she was very upset with how  patient's sister treated both her and him including not allowing him to sit with the family. Wife reports that she has been so upset she has had to see her own provider and therapist. Patient endorses that he does think his wife is more upset then him, but it makes him upset to see her cry and he knows she has cried because of how they were treated. Patient reports that unlike his wife, he is used to his sister's behavior and has decided to "not dwell on it" because he would rather be happy.  He reports that he was initially angry, but both patient and wife endorse that patient has not had his previously routine explosive events and even other family was surprised by his calm reaction throughout his mother's passing. Patient agrees that his anger transferred to disappointment and then he was eventual able to accept how things had happened and is feeling more at peace. Patient reports he is looking more towards his future and spending time with the family the has. He reports his son is out of jail and his daughter appears to appreciate his attempts to comfort her. Wife reports she is shocked to see that he has not made any angry outburst regarding the son-in-law who left the daughter ,and patient himself reports that he has no ill feelings, and is able to recall the positive times he had with the man and again endorses he does not want to spend/waste time being angry.   Psychiatric Specialty Exam: General Appearance: Casual  Eye Contact:  Good  Speech:  Clear and Coherent  Volume:  Normal  Mood:  Euthymic  Affect:  Appropriate  Thought Process:  Coherent  Orientation:  Full (Time, Place, and Person)  Thought Content:  Logical  Suicidal Thoughts:  No  Homicidal Thoughts:  No  Memory:  Immediate;   Good Recent;   Good  Judgement:  Good  Insight:  Good  Psychomotor Activity:  Normal  Concentration:  Concentration: Good  Recall:  NA  Fund of Knowledge:Good  Language: Good  Akathisia:  No  Handed:     AIMS (if indicated):  not done  Assets:  Communication Skills Desire for Improvement Housing Intimacy Resilience Social Support Transportation  ADL's:  Intact  Cognition: Impaired,  Mild  Sleep:  Fair     Diagnosis: MDD, in partial remission  Anticipated Frequency of Visits: Monthly Anticipated Length of Treatment Episode: 1-2 more sessions  Short Term Goals/Goals for Treatment Session:  Communicate feelings after loss   Progress Towards Goals: Met  Treatment Intervention: Cognitive therapy  Medical Necessity: Prevented onset or worsening of patient condition  Assessment Tools:    04/29/2022    2:49 PM 02/19/2022    4:15 PM 01/21/2022   10:15 AM  Depression screen PHQ 2/9  Decreased Interest _0 Down, Depressed, Hopeless _1 PHQ - 2 Score _2 Altered sleeping 3 0 3  Tired, decreased energy _3 Change in appetite 0 0 2  Feeling bad or failure about yourself  0 2 3  Trouble concentrating _4 Moving slowly or fidgety/restless 0 0 3  Suicidal thoughts 0 1 3  PHQ-9 Score _5 Difficult doing work/chores   Somewhat difficult    Flowsheet Row Counselor from 01/21/2022 in [facility] ED to Hosp-Admission (Discharged) from 10/31/2021 in [facility] ED to Hosp-Admission (Discharged) from 10/20/2021 in [facility]  C-SSRS RISK CATEGORY Error: Q3, 4, or 5 should not be populated when Q2 is No No Risk No Risk       Collaboration of Care:   Patient/Guardian was advised Release of Information must be obtained prior to any record release in order to collaborate their care with an outside provider. Patient/Guardian was advised if they have not already done so to contact the registration department to sign all necessary forms in order for [place] to release information regarding their care.   Consent: Patient/Guardian gives verbal consent for treatment and assignment of benefits for services  provided during this visit. Patient/Guardian expressed understanding and agreed to proceed.   Plan:   Patient appears to be using the coping skills he had previously learned and been practicing over the last few months. Despite multiple losses, patient was able to restrict the amount of time he spent isolated, and then was able to hurt others who were hurting around him. Patient insight and judgement have drastically improved.  Smoking has stagnated and would recommend more assistance from his PCP given his commodities.   MDD, in partial remission - Continue Zoloft 124m daily - Continue Trazodone 514mQHS PRN - PHQ-9 at next visit   Tobacco use disorder Continue Gabapentin 300103mID   PGY-3 Jerry [name] 06/13/2022

## 2022-06-17 ENCOUNTER — Encounter: Payer: Self-pay | Admitting: Surgery

## 2022-06-17 ENCOUNTER — Ambulatory Visit: Payer: Medicare HMO | Admitting: Surgery

## 2022-06-17 VITALS — BP 142/76 | HR 95 | Temp 98.0°F | Resp 20 | Ht 76.0 in | Wt 284.0 lb

## 2022-06-17 DIAGNOSIS — I700 Atherosclerosis of aorta: Secondary | ICD-10-CM

## 2022-06-17 NOTE — Progress Notes (Signed)
[facility] [place]  Patient name: Jerry [name] MRN: [id number] DOB: Jun 02, 1956 Sex: male   REQUESTING PROVIDER:    Dr. Wyline Mood   REASON FOR CONSULT:    Aortic plaque  HISTORY OF PRESENT ILLNESS:   Jerry [name] is a 66 y.o. male, who is referred for evaluation of a luminal irregularity within his thoracic aorta.  The patient has a history of CVA in April 2023.  He suffered infarcts in the right brain with left-sided weakness.  Imaging studies showed no significant carotid stenosis.  He was started on dual antiplatelet therapy and discharged home however he returned 10 days later with left hand and face numbness repeat imaging showed several new areas of infarct on the right..  He has undergone a significant workup.  He had a TEE that showed a small PFO without significant right-to-left shunt. 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  He is on a statin for hypercholesterolemia.  He is medically managed for hypertension.  He is a former smoker.  He is a diabetic with A1c around 6.  He was then rolled into the [employer] stroke prevention study.  The patient's weakness has almost completely resolved.  He is no longer smoking.  He does have some clumsiness in the left hand.  During his workup CT angiogram shows a luminal irregularity within the thoracic aorta just beyond the left subclavian artery.  PAST MEDICAL HISTORY    Past Medical History:  Diagnosis Date   Arthritis    Diabetes mellitus 2008   type II   Dyslipidemia    Erectile dysfunction    History of cardiac catheterization 06/2013   Dr. Peter [place]   HTN (hypertension)    Hypogonadism male      FAMILY HISTORY   Family History  Problem Relation Age of Onset   Diabetes Mother    Other Father        death by MVA   Other Brother        died of  MVA   Stroke Neg Hx    Heart disease Neg Hx    Cancer Neg Hx    Hypertension Neg Hx     SOCIAL HISTORY:   Social History   Socioeconomic History   Marital status: Married    Spouse name: Not on file   Number of children: Not on file   Years of education: Not on file   Highest education level: Not on file  Occupational History   Occupation: retired    Comment: prior physical therapist  Tobacco Use   Smoking status: Every Day    Packs/day: 0.25    Types: Cigarettes   Smokeless tobacco: Never   Tobacco comments:    Started smoking again, 1 pack every 4 days  Substance and Sexual Activity   Alcohol use: No   Drug use: No   Sexual activity: Never  Other Topics Concern   Not on file  Social History Narrative   Married 22 years, 2 daughter and 1 son. Retired, used to work as a PT.  Exercises treadmill daily, some weight bearing exercise. Diet    Social Determinants of Health   Financial Resource Strain: Not on file  Food Insecurity: Not on file  Transportation Needs: Not on file  Physical Activity: Not on file  Stress: Not on file  Social Connections:  Not on file  Intimate Partner Violence: Not on file    ALLERGIES:    Allergies  Allergen Reactions   Diclofenac Sodium Other (See Comments)    Gel caused a burning sensation    Lisinopril Cough   Losartan Cough    Unknown bad side effects Unknown bad side effects    CURRENT MEDICATIONS:    Current Outpatient Medications  Medication Sig Dispense Refill   acetaminophen (TYLENOL) 500 MG tablet Take 1,000 mg by mouth every 6 (six) hours as needed for mild pain.     aspirin EC 81 MG tablet Take 1 tablet (81 mg total) by mouth daily. Swallow whole. 30 tablet    Blood Glucose Monitoring Suppl (GLUCOCOM BLOOD GLUCOSE MONITOR) DEVI Please check blood sugar 5 times per day due to fluctuating blood sugars.     clopidogrel (PLAVIX) 75 MG tablet Take 1 tablet (75 mg total) by mouth daily. 90 tablet 3   docusate sodium  (COLACE) 100 MG capsule Take 1 capsule (100 mg total) by mouth every 12 (twelve) hours. (Patient taking differently: Take 100 mg by mouth 2 (two) times daily as needed for moderate constipation.) 30 capsule 0   ezetimibe (ZETIA) 10 MG tablet Take 1 tablet (10 mg total) by mouth daily. 90 tablet 3   gabapentin (NEURONTIN) 300 MG capsule Take 1 capsule (300 mg total) by mouth 3 (three) times daily. 60 capsule 2   glucose blood test strip 2 times per day     ibuprofen (ADVIL) 200 MG tablet Take 400 mg by mouth every 6 (six) hours as needed for headache or moderate pain.     Insulin Glargine (LANTUS SOLOSTAR) 100 UNIT/ML Solostar Pen Inject 15 Units into the skin 2 (two) times daily. (Patient taking differently: Inject 15 Units into the skin at bedtime.) 15 mL 3   melatonin 5 MG TABS Take 5 mg by mouth at bedtime as needed (sleep).     metFORMIN (GLUCOPHAGE) 1000 MG tablet Take 1 tablet (1,000 mg total) by mouth daily. (Patient taking differently: Take 1,000 mg by mouth 2 (two) times daily with a meal.) 180 tablet 0   Multiple Vitamins-Minerals (CENTRUM SILVER 50+MEN) TABS Take 1 tablet by mouth daily.     sertraline (ZOLOFT) 50 MG tablet Take 3 tablets (150 mg total) by mouth daily. 90 tablet 2   Study - OCEANIC-STROKE - asundexian 50 mg or placebo tablet (PI-Sethi) Take 1 tablet (50 mg total) by mouth daily. For Investigational Use Only. Take at the same time each day (preferably in the morning). Tablet should be swallowed whole with water; it CANNOT be crushed or broken. Please contact [facility] if you have any questions regarding this medication or study. 1 tablet 0   traZODone (DESYREL) 50 MG tablet Take 1 tablet (50 mg total) by mouth at bedtime. 30 tablet 2   amLODipine (NORVASC) 10 MG tablet Take 1 tablet (10 mg total) by mouth daily. 30 tablet 2   rosuvastatin (CRESTOR) 40 MG tablet Take 1 tablet (40 mg total) by mouth daily. 30 tablet 2   No current facility-administered  medications for this visit.    REVIEW OF SYSTEMS:   [X]  denotes positive finding, [ ]  denotes negative finding Cardiac  Comments:  Chest pain or chest pressure:    Shortness of breath upon exertion:    Short of breath when lying flat:    Irregular heart rhythm:        Vascular    Pain in calf,  thigh, or hip brought on by ambulation:    Pain in feet at night that wakes you up from your sleep:     Blood clot in your veins:    Leg swelling:         Pulmonary    Oxygen at home:    Productive cough:     Wheezing:         Neurologic    Sudden weakness in arms or legs:  x   Sudden numbness in arms or legs:  x   Sudden onset of difficulty speaking or slurred speech:    Temporary loss of vision in one eye:     Problems with dizziness:         Gastrointestinal    Blood in stool:      Vomited blood:         Genitourinary    Burning when urinating:     Blood in urine:        Psychiatric    Major depression:         Hematologic    Bleeding problems:    Problems with blood clotting too easily:        Skin    Rashes or ulcers:        Constitutional    Fever or chills:     PHYSICAL EXAM:   Vitals:   06/17/22 1407  BP: (!) 142/76  Pulse: 95  Resp: 20  Temp: 98 F (36.7 C)  SpO2: 95%  Weight: 284 lb (128.8 kg)  Height: 6\' 4"  (1.93 m)    GENERAL: The patient is a well-nourished male, in no acute distress. The vital signs are documented above. CARDIAC: There is a regular rate and rhythm.  VASCULAR: Trace edema bilaterally.  I could not palpate pedal pulses however the feet are warm and well-perfused. PULMONARY: Nonlabored respirations MUSCULOSKELETAL: There are no major deformities or cyanosis. NEUROLOGIC: No focal weakness or paresthesias are detected. SKIN: There are no ulcers or rashes noted. PSYCHIATRIC: The patient has a normal affect.  STUDIES:   I have reviewed the following: CT ANGIO 1. Focal fibrofatty atherosclerotic plaque in the aortic  isthmus with an associated tongue of wall adherent thrombus measuring 6 x 4 mm protruding into the aortic lumen. This could represent a source for distal embolization. 2. Slightly limited evaluation of the right subclavian artery secondary to dense contrast bolus within the adjacent right subclavian vein. However, there is no evidence of subclavian aneurysm or other acute abnormality. The vessel is mildly tortuous which may account for the physical exam finding of a pulsatile subclavian mass. 3. Mild atherosclerotic calcifications visualized along the coronary arteries. 4. Diffuse mild bronchial wall thickening. 5. Multilevel degenerative disc disease.   Aortic Atherosclerosis (ICD10-I70.0).   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  I reviewed his CT scan back in May as well as in August, and there does not appear to be any significant interval change.  Potentially the area could be slightly smaller.  It potentially could be stented to trap the thrombus, however this also carries a risk of embolization.  Normally, I would treat this with anticoagulation, however the patient is already on dual antiplatelet therapy with aspirin and Plavix and is still receiving medication for his stroke prevention study.  Therefore, I think the neck step is to repeat his imaging study to evaluate the appearance of the lesion.  I also want to scan  his entire aorta to make sure this is an isolated finding and that he does not have other areas of concern.  Also if stenting were contemplated, I would need to assess his inflow for stent graft deployment.  I will get the CT scan and have him return once it is been completed.  Charlena Cross, MD, FACS [facility] [phone] Pager [phone]

## 2022-06-24 ENCOUNTER — Other Ambulatory Visit: Payer: Self-pay

## 2022-06-24 ENCOUNTER — Ambulatory Visit (INDEPENDENT_AMBULATORY_CARE_PROVIDER_SITE_OTHER): Payer: Medicare HMO

## 2022-06-24 DIAGNOSIS — I639 Cerebral infarction, unspecified: Secondary | ICD-10-CM | POA: Diagnosis not present

## 2022-06-24 DIAGNOSIS — I700 Atherosclerosis of aorta: Secondary | ICD-10-CM

## 2022-06-25 LAB — CUP PACEART REMOTE DEVICE CHECK
Date Time Interrogation Session: 20231217231935
Implantable Pulse Generator Implant Date: 20230417

## 2022-07-02 NOTE — Progress Notes (Signed)
Carelink Summary Report / Loop Recorder 

## 2022-07-04 ENCOUNTER — Ambulatory Visit (HOSPITAL_COMMUNITY)
Admission: RE | Admit: 2022-07-04 | Discharge: 2022-07-04 | Disposition: A | Discharge status: Home / Self Care | Payer: Medicare HMO | Source: Ambulatory Visit | Attending: Surgery | Admitting: Surgery

## 2022-07-04 DIAGNOSIS — K8020 Calculus of gallbladder without cholecystitis without obstruction: Secondary | ICD-10-CM | POA: Insufficient documentation

## 2022-07-04 DIAGNOSIS — I700 Atherosclerosis of aorta: Secondary | ICD-10-CM | POA: Diagnosis present

## 2022-07-04 DIAGNOSIS — K4020 Bilateral inguinal hernia, without obstruction or gangrene, not specified as recurrent: Secondary | ICD-10-CM | POA: Insufficient documentation

## 2022-07-10 ENCOUNTER — Other Ambulatory Visit: Payer: Self-pay | Admitting: *Deleted

## 2022-07-10 DIAGNOSIS — R0989 Other specified symptoms and signs involving the circulatory and respiratory systems: Secondary | ICD-10-CM

## 2022-07-11 ENCOUNTER — Encounter: Payer: Self-pay | Admitting: *Deleted

## 2022-07-11 ENCOUNTER — Other Ambulatory Visit (HOSPITAL_COMMUNITY): Payer: Self-pay

## 2022-07-15 ENCOUNTER — Encounter: Payer: Self-pay | Admitting: Surgery

## 2022-07-15 ENCOUNTER — Ambulatory Visit (INDEPENDENT_AMBULATORY_CARE_PROVIDER_SITE_OTHER): Payer: Medicare HMO | Admitting: Surgery

## 2022-07-15 VITALS — BP 136/83 | HR 94 | Temp 98.3°F | Resp 20 | Ht 76.0 in | Wt 282.0 lb

## 2022-07-15 DIAGNOSIS — I700 Atherosclerosis of aorta: Secondary | ICD-10-CM | POA: Diagnosis not present

## 2022-07-15 NOTE — Progress Notes (Signed)
Vascular and Vein Specialist of   Patient name: Jerry [name] MRN: [id number] DOB: 07-08-1956 Sex: male   REASON FOR VISIT:    Follow up  HISOTRY OF PRESENT ILLNESS:    Jerry [name] is a 67 y.o. male, who is referred for evaluation of a luminal irregularity within his thoracic aorta.  The patient has a history of CVA in April 2023.  He suffered infarcts in the right brain with left-sided weakness.  Imaging studies showed no significant carotid stenosis.  He was started on dual antiplatelet therapy and discharged home however he returned 10 days later with left hand and face numbness repeat imaging showed several new areas of infarct on the right..  He has undergone a significant workup.  He had a TEE that showed a small PFO without significant right-to-left shunt. 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  He is on a statin for hypercholesterolemia.  He is medically managed for hypertension.  He is a former smoker.  He is a diabetic with A1c around 6.  He was then rolled into the [employer] stroke prevention study.  He has had no interval change.   The patient's weakness has almost completely resolved.  He is no longer smoking.  He does have some clumsiness in the left hand.  During his workup CT angiogram shows a luminal irregularity within the thoracic aorta just beyond the left subclavian artery.   PAST MEDICAL HISTORY:   Past Medical History:  Diagnosis Date   Arthritis    Diabetes mellitus 2008   type II   Dyslipidemia    Erectile dysfunction    History of cardiac catheterization 06/2013   Dr. Peter [place]   HTN (hypertension)    Hypogonadism male      FAMILY HISTORY:   Family History  Problem Relation Age of Onset   Diabetes Mother    Other Father        death by MVA   Other Brother        died of MVA    Stroke Neg Hx    Heart disease Neg Hx    Cancer Neg Hx    Hypertension Neg Hx     SOCIAL HISTORY:   Social History   Tobacco Use   Smoking status: Every Day    Packs/day: 0.25    Types: Cigarettes   Smokeless tobacco: Never   Tobacco comments:    Started smoking again, 1 pack every 4 days  Substance Use Topics   Alcohol use: No     ALLERGIES:   Allergies  Allergen Reactions   Diclofenac Sodium Other (See Comments)    Gel caused a burning sensation    Lisinopril Cough   Losartan Cough    Unknown bad side effects Unknown bad side effects     CURRENT MEDICATIONS:   Current Outpatient Medications  Medication Sig Dispense Refill   acetaminophen (TYLENOL) 500 MG tablet Take 1,000 mg by mouth every 6 (six) hours as needed for mild pain.     aspirin EC 81 MG tablet Take 1 tablet (81 mg total) by mouth daily. Swallow whole. 30 tablet    Blood Glucose Monitoring Suppl (GLUCOCOM BLOOD GLUCOSE MONITOR) DEVI Please check blood sugar 5 times per day due to fluctuating blood sugars.     clopidogrel (PLAVIX) 75 MG tablet Take 1 tablet (75 mg total)  by mouth daily. 90 tablet 3   docusate sodium (COLACE) 100 MG capsule Take 1 capsule (100 mg total) by mouth every 12 (twelve) hours. (Patient taking differently: Take 100 mg by mouth 2 (two) times daily as needed for moderate constipation.) 30 capsule 0   ezetimibe (ZETIA) 10 MG tablet Take 1 tablet (10 mg total) by mouth daily. 90 tablet 3   gabapentin (NEURONTIN) 300 MG capsule Take 1 capsule (300 mg total) by mouth 3 (three) times daily. 60 capsule 2   glucose blood test strip 2 times per day     ibuprofen (ADVIL) 200 MG tablet Take 400 mg by mouth every 6 (six) hours as needed for headache or moderate pain.     Insulin Glargine (LANTUS SOLOSTAR) 100 UNIT/ML Solostar Pen Inject 15 Units into the skin 2 (two) times daily. (Patient taking differently: Inject 15 Units into the skin at bedtime.) 15 mL 3   melatonin 5 MG TABS Take 5 mg by  mouth at bedtime as needed (sleep).     metFORMIN (GLUCOPHAGE) 1000 MG tablet Take 1 tablet (1,000 mg total) by mouth daily. (Patient taking differently: Take 1,000 mg by mouth 2 (two) times daily with a meal.) 180 tablet 0   Multiple Vitamins-Minerals (CENTRUM SILVER 50+MEN) TABS Take 1 tablet by mouth daily.     sertraline (ZOLOFT) 50 MG tablet Take 3 tablets (150 mg total) by mouth daily. 90 tablet 2   Study - OCEANIC-STROKE - asundexian 50 mg or placebo tablet (PI-Sethi) Take 1 tablet (50 mg total) by mouth daily. For Investigational Use Only. Take at the same time each day (preferably in the morning). Tablet should be swallowed whole with water; it CANNOT be crushed or broken. Please contact [facility] if you have any questions regarding this medication or study. 1 tablet 0   traZODone (DESYREL) 50 MG tablet Take 1 tablet (50 mg total) by mouth at bedtime. 30 tablet 2   amLODipine (NORVASC) 10 MG tablet Take 1 tablet (10 mg total) by mouth daily. 30 tablet 2   rosuvastatin (CRESTOR) 40 MG tablet Take 1 tablet (40 mg total) by mouth daily. 30 tablet 2   No current facility-administered medications for this visit.    REVIEW OF SYSTEMS:   [X]  denotes positive finding, [ ]  denotes negative finding Cardiac  Comments:  Chest pain or chest pressure:    Shortness of breath upon exertion:    Short of breath when lying flat:    Irregular heart rhythm:        Vascular    Pain in calf, thigh, or hip brought on by ambulation:    Pain in feet at night that wakes you up from your sleep:     Blood clot in your veins:    Leg swelling:         Pulmonary    Oxygen at home:    Productive cough:     Wheezing:         Neurologic    Sudden weakness in arms or legs:     Sudden numbness in arms or legs:     Sudden onset of difficulty speaking or slurred speech:    Temporary loss of vision in one eye:     Problems with dizziness:         Gastrointestinal    Blood in stool:      Vomited blood:         Genitourinary    Burning when urinating:  Blood in urine:        Psychiatric    Major depression:         Hematologic    Bleeding problems:    Problems with blood clotting too easily:        Skin    Rashes or ulcers:        Constitutional    Fever or chills:      PHYSICAL EXAM:   Vitals:   07/15/22 1017  BP: 136/83  Pulse: 94  Resp: 20  Temp: 98.3 F (36.8 C)  SpO2: 94%  Weight: 282 lb (127.9 kg)  Height: 6\' 4"  (1.93 m)    GENERAL: The patient is a well-nourished male, in no acute distress. The vital signs are documented above. CARDIAC: There is a regular rate and rhythm PULMONARY: Non-labored respirations MUSCULOSKELETAL: There are no major deformities or cyanosis. NEUROLOGIC: No focal weakness or paresthesias are detected. SKIN: There are no ulcers or rashes noted. PSYCHIATRIC: The patient has a normal affect.  STUDIES:   I reviewed the following CT scan:  Aortic atherosclerosis.  No evidence of abdominal aortic aneurysm.   Cholelithiasis. No radiographic evidence of cholecystitis.   Small left and tiny right inguinal hernias, both containing only fat. MEDICAL ISSUES:   Aortic wall thrombus: Unfortunately, the patient did not get the appropriate CT scan.  He had a noncontrast abdomen and pelvis study.  I discussed that I could not evaluate his area of concern due to lack of contrast and this being an abdomen and pelvis study.  I am scheduling him for a CT angio of the chest abdomen pelvis and he will follow-up once this has been done.    Leia Alf, MD, FACS [facility] [phone] Pager [phone]

## 2022-07-29 ENCOUNTER — Ambulatory Visit: Payer: Medicare HMO | Admitting: Surgery

## 2022-07-29 ENCOUNTER — Ambulatory Visit: Payer: Medicare HMO | Admitting: Neurology

## 2022-07-29 ENCOUNTER — Ambulatory Visit: Payer: Medicare HMO | Attending: Internal Medicine

## 2022-07-29 DIAGNOSIS — I639 Cerebral infarction, unspecified: Secondary | ICD-10-CM

## 2022-07-30 ENCOUNTER — Other Ambulatory Visit: Payer: Self-pay

## 2022-07-30 DIAGNOSIS — I700 Atherosclerosis of aorta: Secondary | ICD-10-CM

## 2022-07-30 LAB — CUP PACEART REMOTE DEVICE CHECK
Date Time Interrogation Session: 20240119230544
Implantable Pulse Generator Implant Date: 20230417

## 2022-07-31 ENCOUNTER — Other Ambulatory Visit: Payer: Self-pay

## 2022-07-31 DIAGNOSIS — I700 Atherosclerosis of aorta: Secondary | ICD-10-CM

## 2022-07-31 NOTE — Progress Notes (Signed)
[facility] Summary Report / Loop Recorder

## 2022-08-01 ENCOUNTER — Ambulatory Visit (HOSPITAL_COMMUNITY): Payer: Medicare HMO | Admitting: Student in an Organized Health Care Education/Training Program

## 2022-08-01 NOTE — Addendum Note (Signed)
Addended by: Kaleen Mask on: 08/01/2022 03:11 PM   Modules accepted: Orders

## 2022-08-02 ENCOUNTER — Encounter (HOSPITAL_COMMUNITY): Payer: Self-pay | Admitting: Student in an Organized Health Care Education/Training Program

## 2022-08-02 ENCOUNTER — Ambulatory Visit (HOSPITAL_COMMUNITY): Payer: Medicare HMO | Admitting: Student in an Organized Health Care Education/Training Program

## 2022-08-02 VITALS — BP 155/91 | HR 101 | Resp 20 | Wt 281.0 lb

## 2022-08-02 DIAGNOSIS — F330 Major depressive disorder, recurrent, mild: Secondary | ICD-10-CM | POA: Insufficient documentation

## 2022-08-02 DIAGNOSIS — F332 Major depressive disorder, recurrent severe without psychotic features: Secondary | ICD-10-CM

## 2022-08-02 DIAGNOSIS — F4321 Adjustment disorder with depressed mood: Secondary | ICD-10-CM

## 2022-08-02 MED ORDER — TRAZODONE HCL 100 MG PO TABS: 100.0000 mg | ORAL_TABLET | Freq: Every evening | ORAL | 3 refills | Status: DC | PRN

## 2022-08-02 MED ORDER — SERTRALINE HCL 50 MG PO TABS: 150.0000 mg | ORAL_TABLET | Freq: Every day | ORAL | 3 refills | Status: DC

## 2022-08-02 NOTE — Addendum Note (Signed)
Addended by: Kaleen Mask on: 08/02/2022 11:38 AM   Modules accepted: Orders

## 2022-08-02 NOTE — Progress Notes (Signed)
[facility] [address] [place] [address] Dept: [phone] Dept Fax: [phone]  Psychotherapy Progress Note  Patient ID: Jerry [name], male  DOB: 03/06/1956, 67 y.o.  MRN: [id number]  08/02/2022 Start time: 10:13A End time: 11:03A  Method of Visit: Face-to-Face  Present:  Wife  Current Concerns: Grief, Irritability  Current Symptoms: Depressed Mood and Family Stress  Mood: 7/10 with 10 being bad.  Patient reports that his mood has been "up and down" and endorses that this is mostly because he is been thinking about his deceased mother.  Patient denies SI, HI and AVH.  Patient reports that his sleep is still fairly poor but he is able to get some sleep in his bed before waking up around 3 or 4 AM and going to watch the news on his couch.  Patient endorses good appetite and that he has also started working out recently.  Patient's: Patient reports that he is not sure his trazodone is doing much anymore but is okay with increasing it and taking as needed for sleep.  Patient does not endorse any issues with his Zoloft 150 mg daily.  Update/agenda:  Patient reports that he started working out walking on the treadmill, resistance training, and stretching at least 3 times a week.  Patient reports that he likes working out because it helps to keep him busy and allows him time to process his feelings.  Patient reports that unfortunately his son who was not present at the last visit has been released, but continues to be very upset with the family and is sending distressing emails full of anger.  Patient also endorses that he has another daughter he has been trying to continue to have a good relationship with but throughout their life that has been continuous.  Patient's wife endorses that she feels that patient is really doing his best to be a supportive father however they both believe that the daughter has other issues from  childhood that may be contributing to the the complications of the relationship.  Patient endorses all of these are stressors as well as continue to process the grief of losing his mother.  Psychoeducation: Discussion was had with patient about the 5 stages of grief.  Patient was able to identify that he has been through all the stages but is cycled back into depression.  Patient reports that he has to some degree accepted his mother's death but is constantly missing her.  Patient reports that for him acceptance looks like "not being isolated or irritable" and not being a burden on his wife.  Patient reports he is constantly working to feel this by using coping skills and not allowing his mood to dictate his actions.  Patient is able to identify times in the past where he has practiced these behaviors and outcome was ultimately successful.  Provider recommended that patient remember all the times where he has been successful and how great he felt being successful by not allowing his mood to dictate all of his actions.  Provider and patient also discussed that patient should give himself more credit regarding the positive skills he practices and the way he gives to others.  Patient endorsed the 3 the following characteristics are what he feels are most important to him: 1 letting others, having a fear of God, and being honest.   Psychiatric Specialty Exam: General Appearance: Casual  Eye Contact:  Good  Speech:  Clear and Coherent  Volume:  Normal  Mood:  Dysphoric  Affect:  Appropriate  Thought Process:  Coherent  Orientation:  Full (Time, Place, and Person)  Thought Content:  Logical  Suicidal Thoughts:  No  Homicidal Thoughts:  No  Memory:  Immediate;   Good Recent;   Good  Judgement:  Good  Insight:  Good  Psychomotor Activity:  Normal  Concentration:  Concentration: Good  Recall:  NA  Fund of Knowledge:Good  Language: Good  Akathisia:  NA  Handed:    AIMS (if indicated):  not done   Assets:  Communication Skills Desire for Improvement Housing Intimacy Leisure Time Resilience Social Support  ADL's:  Intact  Cognition: WNL  Sleep:  Fair     Diagnosis: MDD, recurrent, mild Grief  Anticipated Frequency of Visits: Monthly Anticipated Length of Treatment Episode: 8  Short Term Goals/Goals for Treatment Session:  -Reminding self of his current strengths and how he has been successful in the past getting through grieving states  -Not becoming over isolative or irritable and taking it out on family members  Progress Towards Goals: Progressing  Treatment Intervention: Cognitive Behavioral therapy  Medical Necessity: Improved patient condition  Assessment Tools:    08/02/2022   10:27 AM 04/29/2022    2:49 PM 02/19/2022    4:15 PM  Depression screen PHQ 2/9  Decreased Interest 1 2 3   Down, Depressed, Hopeless 2 1 3   PHQ - 2 Score 3 3 6   Altered sleeping 2 3 0  Tired, decreased energy 2 2 3   Change in appetite 2 0 0  Feeling bad or failure about yourself  1 0 2  Trouble concentrating 2 3 3   Moving slowly or fidgety/restless 0 0 0  Suicidal thoughts 0 0 1  PHQ-9 Score 12 11 15     Flowsheet Row Counselor from 01/21/2022 in [facility] ED to Hosp-Admission (Discharged) from 10/31/2021 in [facility] ED to Hosp-Admission (Discharged) from 10/20/2021 in [facility]  C-SSRS RISK CATEGORY Error: Q3, 4, or 5 should not be populated when Q2 is No No Risk No Risk       Collaboration of Care:   Patient/Guardian was advised Release of Information must be obtained prior to any record release in order to collaborate their care with an outside provider. Patient/Guardian was advised if they have not already done so to contact the registration department to sign all necessary forms in order for [place] to release information regarding their care.   Consent: Patient/Guardian gives verbal consent for  treatment and assignment of benefits for services provided during this visit. Patient/Guardian expressed understanding and agreed to proceed.   Plan:   Will increase patient's trazodone to 100 mg to address patient feeling as though he is not doing much however, there does appear to be some benefit as patient was initially not getting any sleep prior to taking trazodone but is now at least able to sleep in his bed.  We will continue patient Zoloft at current dose as it appears more of his depressive symptoms are likely secondary to grief which is a normal psychological response.  Patient does not appear to be overly depressed as he is able to complete his ADLs and interact with others and has not discontinued his activities.  MDD, in partial remission - Continue Zoloft 150mg  daily -Increase trazodone to 100 mg nightly as needed - PHQ-9 at next visit   Tobacco use disorder Continue Gabapentin 300mg  BID     PGY-3  Freida Busman, MD 08/02/2022

## 2022-08-15 ENCOUNTER — Ambulatory Visit (HOSPITAL_COMMUNITY)
Admission: RE | Admit: 2022-08-15 | Discharge: 2022-08-15 | Disposition: A | Discharge status: Home / Self Care | Payer: Medicare HMO | Source: Ambulatory Visit | Attending: Vascular Surgery | Admitting: Vascular Surgery

## 2022-08-15 DIAGNOSIS — I700 Atherosclerosis of aorta: Secondary | ICD-10-CM | POA: Insufficient documentation

## 2022-08-15 MED ORDER — IOHEXOL 350 MG/ML SOLN
100.0000 mL | Freq: Once | INTRAVENOUS | Status: AC | PRN
  Administered 2022-08-15: 100 mL via INTRAVENOUS

## 2022-08-21 ENCOUNTER — Ambulatory Visit (HOSPITAL_COMMUNITY): Payer: Medicare HMO

## 2022-09-02 ENCOUNTER — Encounter: Payer: Self-pay | Admitting: Surgery

## 2022-09-02 ENCOUNTER — Ambulatory Visit: Payer: Medicare HMO

## 2022-09-02 ENCOUNTER — Ambulatory Visit: Payer: Medicare HMO | Admitting: Surgery

## 2022-09-02 VITALS — BP 132/84 | HR 91 | Temp 98.0°F | Resp 20 | Ht 76.0 in | Wt 283.0 lb

## 2022-09-02 DIAGNOSIS — I700 Atherosclerosis of aorta: Secondary | ICD-10-CM | POA: Diagnosis not present

## 2022-09-02 DIAGNOSIS — I639 Cerebral infarction, unspecified: Secondary | ICD-10-CM | POA: Diagnosis not present

## 2022-09-02 NOTE — Progress Notes (Signed)
[facility] [place]  Patient name: Jerry [name] MRN: [id number] DOB: March 23, 1956 Sex: male   REASON FOR VISIT:    Follow up  HISOTRY OF PRESENT ILLNESS:   Jerry [name] is a 67 y.o. male, who is referred for evaluation of a luminal irregularity within his thoracic aorta. The patient has a history of CVA in April 2023.  He suffered infarcts in the right brain with left-sided weakness. Imaging studies showed no significant carotid stenosis.  He was started on dual antiplatelet therapy and discharged home however he returned 10 days later with left hand and face numbness repeat imaging showed several new areas of infarct on the right..  He has undergone a significant workup.  He had a TEE that showed a small PFO without significant right-to-left shunt. 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  He is on a statin for hypercholesterolemia.  He is medically managed for hypertension.  He is a former smoker.  He is a diabetic with A1c around 6.  He was then rolled into the [employer] stroke prevention study.   He has had no interval change.  He denies any neurologic symptoms.  He denies abdominal pain.  Denies any leg pain   The patient's weakness has almost completely resolved.  He is no longer smoking.  He does have some clumsiness in the left hand.  During his workup CT angiogram shows a luminal irregularity within the thoracic aorta just beyond the left subclavian artery.    PAST MEDICAL HISTORY:   Past Medical History:  Diagnosis Date   Arthritis    Diabetes mellitus 2008   type II   Dyslipidemia    Erectile dysfunction    History of cardiac catheterization 06/2013   Dr. Peter Martinique   HTN (hypertension)    Hypogonadism male      FAMILY HISTORY:   Family History  Problem Relation Age of Onset   Diabetes  Mother    Other Father        death by MVA   Other Brother        died of MVA   Stroke Neg Hx    Heart disease Neg Hx    Cancer Neg Hx    Hypertension Neg Hx     SOCIAL HISTORY:   Social History   Tobacco Use   Smoking status: Every Day    Packs/day: 0.25    Types: Cigarettes   Smokeless tobacco: Never   Tobacco comments:    Started smoking again, 1 pack every 4 days  Substance Use Topics   Alcohol use: No     ALLERGIES:   Allergies  Allergen Reactions   Diclofenac Sodium Other (See Comments)    Gel caused a burning sensation    Lisinopril Cough   Losartan Cough    Unknown bad side effects Unknown bad side effects     CURRENT MEDICATIONS:   Current Outpatient Medications  Medication Sig Dispense Refill   acetaminophen (TYLENOL) 500 MG tablet Take 1,000 mg by mouth every 6 (six) hours as needed for mild pain.     aspirin EC 81 MG tablet Take 1 tablet (81 mg total) by mouth daily. Swallow whole. 30 tablet    Blood Glucose Monitoring Suppl (GLUCOCOM BLOOD GLUCOSE MONITOR) DEVI Please check blood sugar 5 times per day due to fluctuating blood sugars.  clopidogrel (PLAVIX) 75 MG tablet Take 1 tablet (75 mg total) by mouth daily. 90 tablet 3   docusate sodium (COLACE) 100 MG capsule Take 1 capsule (100 mg total) by mouth every 12 (twelve) hours. (Patient taking differently: Take 100 mg by mouth 2 (two) times daily as needed for moderate constipation.) 30 capsule 0   ezetimibe (ZETIA) 10 MG tablet Take 1 tablet (10 mg total) by mouth daily. 90 tablet 3   gabapentin (NEURONTIN) 300 MG capsule Take 1 capsule (300 mg total) by mouth 3 (three) times daily. 60 capsule 2   glucose blood test strip 2 times per day     ibuprofen (ADVIL) 200 MG tablet Take 400 mg by mouth every 6 (six) hours as needed for headache or moderate pain.     Insulin Glargine (LANTUS SOLOSTAR) 100 UNIT/ML Solostar Pen Inject 15 Units into the skin 2 (two) times daily. (Patient taking differently:  Inject 15 Units into the skin at bedtime.) 15 mL 3   melatonin 5 MG TABS Take 5 mg by mouth at bedtime as needed (sleep).     metFORMIN (GLUCOPHAGE) 1000 MG tablet Take 1 tablet (1,000 mg total) by mouth daily. (Patient taking differently: Take 1,000 mg by mouth 2 (two) times daily with a meal.) 180 tablet 0   Multiple Vitamins-Minerals (CENTRUM SILVER 50+MEN) TABS Take 1 tablet by mouth daily.     sertraline (ZOLOFT) 50 MG tablet Take 3 tablets (150 mg total) by mouth daily. 90 tablet 3   Study - OCEANIC-STROKE - asundexian 50 mg or placebo tablet (PI-Sethi) Take 1 tablet (50 mg total) by mouth daily. For Investigational Use Only. Take at the same time each day (preferably in the morning). Tablet should be swallowed whole with water; it CANNOT be crushed or broken. Please contact [facility] if you have any questions regarding this medication or study. 1 tablet 0   traZODone (DESYREL) 100 MG tablet Take 1 tablet (100 mg total) by mouth at bedtime as needed for sleep. 30 tablet 3   amLODipine (NORVASC) 10 MG tablet Take 1 tablet (10 mg total) by mouth daily. 30 tablet 2   rosuvastatin (CRESTOR) 40 MG tablet Take 1 tablet (40 mg total) by mouth daily. 30 tablet 2   No current facility-administered medications for this visit.    REVIEW OF SYSTEMS:   '[X]'$  denotes positive finding, '[ ]'$  denotes negative finding Cardiac  Comments:  Chest pain or chest pressure:    Shortness of breath upon exertion:    Short of breath when lying flat:    Irregular heart rhythm:        Vascular    Pain in calf, thigh, or hip brought on by ambulation:    Pain in feet at night that wakes you up from your sleep:     Blood clot in your veins:    Leg swelling:         Pulmonary    Oxygen at home:    Productive cough:     Wheezing:         Neurologic    Sudden weakness in arms or legs:     Sudden numbness in arms or legs:     Sudden onset of difficulty speaking or slurred speech:    Temporary  loss of vision in one eye:     Problems with dizziness:         Gastrointestinal    Blood in stool:     Vomited blood:  Genitourinary    Burning when urinating:     Blood in urine:        Psychiatric    Major depression:         Hematologic    Bleeding problems:    Problems with blood clotting too easily:        Skin    Rashes or ulcers:        Constitutional    Fever or chills:      PHYSICAL EXAM:   Vitals:   09/02/22 1116  BP: 132/84  Pulse: 91  Resp: 20  Temp: 98 F (36.7 C)  SpO2: 95%  Weight: 283 lb (128.4 kg)  Height: '6\' 4"'$  (1.93 m)    GENERAL: The patient is a well-nourished male, in no acute distress. The vital signs are documented above. CARDIAC: There is a regular rate and rhythm.  VASCULAR: Palpable radial pulses PULMONARY: Non-labored respirations ABDOMEN: Soft and non-tender  MUSCULOSKELETAL: There are no major deformities or cyanosis. NEUROLOGIC: No focal weakness or paresthesias are detected. SKIN: There are no ulcers or rashes noted. PSYCHIATRIC: The patient has a normal affect.  STUDIES:   I have reviewed his CT scan with the following findings: 1. 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 If anything, the appearance of the mural thrombus may be slightly less conspicuous than seen in August. 2. No evidence of embolic disease in the spleen, kidneys or visceral arteries. 3. Additional ancillary findings as above without significant interval change.  MEDICAL ISSUES:   Aortic atheroma: Based off of his CT scan today this looks slightly less prominent.  I have recommended continuing dual antiplatelet therapy in hopes that this will resolve with time.  It potentially could be treated with a stent graft, however I would worry about embolization at the time of stent graft  deployment.  Since he has remained asymptomatic, we will continue to treat him medically.  He will follow-up in 1 year with a CT scan    Leia Alf, MD, FACS [facility] [phone] Pager [phone]

## 2022-09-03 LAB — CUP PACEART REMOTE DEVICE CHECK
Date Time Interrogation Session: 20240221231819
Implantable Pulse Generator Implant Date: 20230417

## 2022-09-05 ENCOUNTER — Telehealth (HOSPITAL_COMMUNITY): Payer: Self-pay | Admitting: *Deleted

## 2022-09-05 NOTE — Telephone Encounter (Signed)
Fax request for patients Trazodone. The request states 50 mg HS but order is for 100 mg HS. Last filled on 1/29 but he should have refills per chart. Will forward this concern to the provider.

## 2022-09-11 ENCOUNTER — Ambulatory Visit (INDEPENDENT_AMBULATORY_CARE_PROVIDER_SITE_OTHER): Payer: Medicare HMO | Admitting: Student in an Organized Health Care Education/Training Program

## 2022-09-11 ENCOUNTER — Ambulatory Visit (HOSPITAL_COMMUNITY): Payer: Medicare HMO | Admitting: Student in an Organized Health Care Education/Training Program

## 2022-09-11 VITALS — BP 156/95 | HR 91 | Resp 12 | Wt 284.0 lb

## 2022-09-11 DIAGNOSIS — F4381 Prolonged grief disorder: Secondary | ICD-10-CM

## 2022-09-11 DIAGNOSIS — F332 Major depressive disorder, recurrent severe without psychotic features: Secondary | ICD-10-CM

## 2022-09-11 MED ORDER — TRAZODONE HCL 100 MG PO TABS: 100.0000 mg | ORAL_TABLET | Freq: Every evening | ORAL | 3 refills | Status: DC | PRN

## 2022-09-11 MED ORDER — SERTRALINE HCL 100 MG PO TABS: 100.0000 mg | ORAL_TABLET | Freq: Every day | ORAL | 2 refills | Status: DC

## 2022-09-11 NOTE — Patient Instructions (Addendum)
Decrease Zoloft to '100mg'$  daily Continue Trazodone '100mg'$  nightly Please make sure that Dr. Rayann Heman about getting sodium labs

## 2022-09-11 NOTE — Progress Notes (Signed)
[place] [address] Dept: [phone] Dept Fax: [phone]  Psychotherapy Progress Note  Patient ID: Jerry [name], male  DOB: [birth year], 67 y.o.  MRN: [id number]  09/11/2022 Start time: 1030A End time: 1130  Method of Visit: Face-to-Face  Attending, Dr. Bobby Rumpf was present for assessment today in entirety   Present: family, Wife  Current Concerns: Continues to endorse regret/disappointment regarding the loss of his granddaughter over 2 years ago.  Recently lost a close family member.  Wife concerns-sometimes feels that patient isolates himself more than other days due to dysphoric mood.  Current Symptoms: Depressed Mood  Psychiatric Specialty Exam: General Appearance: Casual  Eye Contact:  Good  Speech:  Clear and Coherent  Volume:  Normal  Mood:  Euthymic  Affect:  Appropriate  Thought Process:  Coherent  Orientation:  Full (Time, Place, and Person)  Thought Content:  Logical  Suicidal Thoughts:  No  Homicidal Thoughts:  No  Memory:  Immediate;   Good Recent;   Good  Judgement:  Good  Insight:  Good  Psychomotor Activity:  Normal  Concentration:  Concentration: Good  Recall:  NA  Fund of Knowledge:Good  Language: Good  Akathisia:  NA  Handed:    AIMS (if indicated):  not done  Assets:  Communication Skills Desire for Improvement Housing Resilience Social Support  ADL's:  Intact  Cognition: WNL  Sleep:  Fair     Diagnosis: MDD, recurrent, mild Grief  Anticipated Frequency of Visits: monthly Anticipated Length of Treatment Episode: 10 months  Medication: Compliant, continues to find beneficial.  Patient endorses improved sleep, good appetite and denies SI, HI and AVH.  Patient reports that his mood is "pretty good today."  Short Term Goals/Goals for Treatment Session:   Continue to verbally communicate with family members Decrease amount of time spent isolating when  feeling down Continue to work towards positive affirmations Continue to exercise for physical health Continue to take care of physical health, i.e. attending appointments and medication compliance Continue to work on addressing past grief and processing this Continue to implement practicing positive coping mechanisms when feeling dysphoric     Progress Towards Goals: Progressing as evidenced by patient continuing to communicate with family members, working out, improved physical health despite continued to experience losses of family members   Treatment Intervention: Insight-oriented therapy and Supportive therapy  Medical Necessity: Assisted patient to achieve or maintain maximum functional capacity  Assessment Tools:    08/02/2022   10:27 AM 04/29/2022    2:49 PM 02/19/2022    4:15 PM  Depression screen PHQ 2/9  Decreased Interest '1 2 3  '$ Down, Depressed, Hopeless '2 1 3  '$ PHQ - 2 Score '3 3 6  '$ Altered sleeping 2 3 0  Tired, decreased energy '2 2 3  '$ Change in appetite 2 0 0  Feeling bad or failure about yourself  1 0 2  Trouble concentrating '2 3 3  '$ Moving slowly or fidgety/restless 0 0 0  Suicidal thoughts 0 0 1  PHQ-9 Score '12 11 15    '$ Flowsheet Row Counselor from 01/21/2022 in [facility] to Hosp-Admission (Discharged) from 10/31/2021 in [place] ED to Hosp-Admission (Discharged) from 10/20/2021 in [facility]  C-SSRS RISK CATEGORY Error: Q3, 4, or 5 should not be populated when Q2 is No No Risk No Risk       Collaboration of Care:   Patient/Guardian  was advised Release of Information must be obtained prior to any record release in order to collaborate their care with an outside provider. Patient/Guardian was advised if they have not already done so to contact the registration department to sign all necessary forms in order for [place] to release information regarding their care.   Consent: Patient/Guardian  gives verbal consent for treatment and assignment of benefits for services provided during this visit. Patient/Guardian expressed understanding and agreed to proceed.   Plan:   Based on assessment today, patient appears to be progressing in terms of how he responds to situations that may lead to dysphoric mood.  Patient also appears to be using his coping skills that he has learned throughout his time in therapy and practicing them especially with his wife.  Patient is again suffering another loss in his family however, he does not appear to be significantly decompensating.  Patient appears to be in a place currently, where his therapy sessions in the future may focus on previous events that led to significant amount of grief.  Patient is still reluctant to address these, but had enough insight today to recognize that he did not cope appropriately nor did he process his loss.  Although patient appears to be doing very well on current medication regimen, patient did endorse feeling more tired than usual lately after taking his a.m. medications.  On chart review, 05/2022 patient sodium did appear to be low however upon further review, patient did have hyperglycemia at that time.  When corrected his sodium appears to be normal all, despite this will decrease patient's Zoloft to 100 mg and have asked that patient contact his PCP open (at his request) that patient have a sodium repeated.  MDD, recurrent, mild Grief, complex - Decrease Zoloft to 100 mg daily - Continue trazodone 100 mg nightly  PGY-3 Freida Busman, MD 09/11/2022

## 2022-09-14 NOTE — Progress Notes (Signed)
Carelink Summary Report / Loop Recorder 

## 2022-10-07 ENCOUNTER — Ambulatory Visit: Payer: Medicare HMO | Attending: Internal Medicine

## 2022-10-07 DIAGNOSIS — I639 Cerebral infarction, unspecified: Secondary | ICD-10-CM | POA: Diagnosis not present

## 2022-10-08 LAB — CUP PACEART REMOTE DEVICE CHECK
Date Time Interrogation Session: 20240331232330
Implantable Pulse Generator Implant Date: 20230417

## 2022-10-11 NOTE — Progress Notes (Signed)
Carelink Summary Report / Loop Recorder 

## 2022-10-24 ENCOUNTER — Ambulatory Visit: Payer: Medicare HMO | Admitting: Neurology

## 2022-10-24 ENCOUNTER — Encounter: Payer: Self-pay | Admitting: Neurology

## 2022-10-24 VITALS — BP 161/87 | HR 88 | Ht 76.0 in | Wt 283.2 lb

## 2022-10-24 DIAGNOSIS — I639 Cerebral infarction, unspecified: Secondary | ICD-10-CM

## 2022-10-24 DIAGNOSIS — Z8673 Personal history of transient ischemic attack (TIA), and cerebral infarction without residual deficits: Secondary | ICD-10-CM | POA: Diagnosis not present

## 2022-10-24 NOTE — Progress Notes (Signed)
[facility] [address] [place]. [place] [address] [phone]       OFFICE FOLLOW-UP NOTE  [name] Date of Birth:  [birth year] Medical Record Number:  [id number]   HPI: Initial visit 11/07/2021 [name] is 67 year old African-American male seen today for initial office follow-up visit following hospital admission for stroke in April 2023.  He is accompanied by his wife.  History is obtained from them and review of electronic medical records and opossum reviewed pertinent available imaging films in PACS.  He has past medical history of diabetes, hypertension, hyperlipidemia, obesity who initially presented on 10/21/2021 to [facility] with left leg weakness for 4 days prior to admission.  MRI scan showed acute infarcts involving right caudate, right parietal and frontal lobes.  CTA of the brain and neck showed no large vessel stenosis or occlusion.  Echocardiogram showed ejection fraction of 70 to 75% with grade 1 diastolic dysfunction.  LDL cholesterol is 88 mg percent.  Hemoglobin A 1C was 6.2.  Patient stroke etiology was felt to be cryptogenic and loop recorder was placed.  He was started on dual antiplatelet therapy of aspirin and Plavix.  He returned 10 days later on 11/01/2021 with sudden onset left hand and lower face numbness.  Repeat MRI scan at this time showed several additional new infarcts all again in the right hemisphere.  This time a TEE was done which showed moderate aortic arch blocks as well as a small PFO.  However TCD bubble study was negative for significant right-to-left shunt and the PFO was felt to be clinically insignificant.  Loop recorder did not show any paroxysmal A-fib.  Patient was on aspirin and Plavix which was changed to aspirin and Brilinta for 4 weeks followed by aspirin alone.  Patient was also randomized to participate in the [facility] stroke prevention study ( asundexian versus placebo and standard medical therapy )   patient states he is done well his left-sided numbness and weakness has resolved is almost back to his baseline.  He is planning to start outpatient physical therapy later on this month.  He has quit smoking completely since her stroke.  He still occasionally drops objects from his hand when he is tired and not careful.  He is noted some diminished fine motor skills in the left hand as well.  For the last 2 weeks he has been complaining of posterior neck and shoulder pain that occasionally radiates into either shoulder more often to the right.  He is also noticed some cognitive difficulties with difficulty in calculations and thinking through problems quickly has to do it slowly eventually gets it this is new since the stroke. Update 10/24/2022 : He returns for follow-up after last visit a year ago.  He is accompanied by his wife.  He is doing well.  He has not had no recurrent stroke or TIA symptoms.  He continues to do well.  He feels his stamina at times is not good and leg might occasionally drag but he can walk quite well.  He has some swelling in his right leg as well as some dry rash and advised him to see with his primary care physician for the same.  He remains on aspirin and Plavix and [facility] stroke study trial medication is tolerating it well without significant bruising or bleeding.  He states his blood pressure is under good control.  Sugars are also excellent and last A1c was 5.2 last month.  He is tolerating Crestor  well without significant muscle aches and pains.  Last lipid profile checked by primary care physician was satisfactory.  The patient appears to be at risk for obstructive sleep apnea and the wife does admit that he snores however the patient is declining referral for polysomnogram and sleep study at the present time. ROS:   14 system review of systems is positive for weakness, numbness, bruising, memory difficulties, neck pain all other systems negative  PMH:  Past Medical History:   Diagnosis Date   Arthritis    Diabetes mellitus 2008   type II   Dyslipidemia    Erectile dysfunction    History of cardiac catheterization 06/2013   Dr. Peter [place]   HTN (hypertension)    Hypogonadism male     Social History:  Social History   Socioeconomic History   Marital status: Married    Spouse name: Not on file   Number of children: Not on file   Years of education: Not on file   Highest education level: Not on file  Occupational History   Occupation: retired    Comment: prior physical therapist  Tobacco Use   Smoking status: Every Day    Packs/day: .25    Types: Cigarettes   Smokeless tobacco: Never   Tobacco comments:    Started smoking again, 1 pack every 4 days  Substance and Sexual Activity   Alcohol use: No   Drug use: No   Sexual activity: Never  Other Topics Concern   Not on file  Social History Narrative   Married 22 years, 2 daughter and 1 son. Retired, used to work as a PT.  Exercises treadmill daily, some weight bearing exercise. Diet    Social Determinants of Health   Financial Resource Strain: Not on file  Food Insecurity: Not on file  Transportation Needs: Not on file  Physical Activity: Not on file  Stress: Not on file  Social Connections: Not on file  Intimate Partner Violence: Not on file    Medications:   Current Outpatient Medications on File Prior to Visit  Medication Sig Dispense Refill   acetaminophen (TYLENOL) 500 MG tablet Take 1,000 mg by mouth every 6 (six) hours as needed for mild pain.     aspirin EC 81 MG tablet Take 1 tablet (81 mg total) by mouth daily. Swallow whole. 30 tablet    Blood Glucose Monitoring Suppl (GLUCOCOM BLOOD GLUCOSE MONITOR) DEVI Please check blood sugar 5 times per day due to fluctuating blood sugars.     clopidogrel (PLAVIX) 75 MG tablet Take 1 tablet (75 mg total) by mouth daily. 90 tablet 3   docusate sodium (COLACE) 100 MG capsule Take 1 capsule (100 mg total) by mouth every 12 (twelve)  hours. (Patient taking differently: Take 100 mg by mouth 2 (two) times daily as needed for moderate constipation.) 30 capsule 0   ezetimibe (ZETIA) 10 MG tablet Take 1 tablet (10 mg total) by mouth daily. 90 tablet 3   gabapentin (NEURONTIN) 300 MG capsule Take 1 capsule (300 mg total) by mouth 3 (three) times daily. 60 capsule 2   glucose blood test strip 2 times per day     ibuprofen (ADVIL) 200 MG tablet Take 400 mg by mouth every 6 (six) hours as needed for headache or moderate pain.     Insulin Glargine (LANTUS SOLOSTAR) 100 UNIT/ML Solostar Pen Inject 15 Units into the skin 2 (two) times daily. (Patient taking differently: Inject 15 Units into the skin at bedtime.) 15  mL 3   melatonin 5 MG TABS Take 5 mg by mouth at bedtime as needed (sleep).     metFORMIN (GLUCOPHAGE) 1000 MG tablet Take 1 tablet (1,000 mg total) by mouth daily. (Patient taking differently: Take 1,000 mg by mouth 2 (two) times daily with a meal.) 180 tablet 0   Multiple Vitamins-Minerals (CENTRUM SILVER 50+MEN) TABS Take 1 tablet by mouth daily.     sertraline (ZOLOFT) 100 MG tablet Take 1 tablet (100 mg total) by mouth daily. 30 tablet 2   Study - OCEANIC-STROKE - asundexian 50 mg or placebo tablet (PI-Brecklyn Galvis) Take 1 tablet (50 mg total) by mouth daily. For Investigational Use Only. Take at the same time each day (preferably in the morning). Tablet should be swallowed whole with water; it CANNOT be crushed or broken. Please contact [facility] if you have any questions regarding this medication or study. 1 tablet 0   traZODone (DESYREL) 100 MG tablet Take 1 tablet (100 mg total) by mouth at bedtime as needed for sleep. 30 tablet 3   No current facility-administered medications on file prior to visit.    Allergies:   Allergies  Allergen Reactions   Diclofenac Sodium Other (See Comments)    Gel caused a burning sensation    Lisinopril Cough   Losartan Cough    Unknown bad side effects Unknown bad side  effects    Physical Exam General: Obese middle-aged African-American male seated, in no evident distress Head: head normocephalic and atraumatic.  Neck: supple with no carotid or supraclavicular bruits Cardiovascular: regular rate and rhythm, no murmurs Musculoskeletal: no deformity Skin:  no rash/petichiae Vascular:  Normal pulses all extremities Vitals:   10/24/22 1105  BP: (!) 161/87  Pulse: 88   Neurologic Exam Mental Status: Awake and fully alert. Oriented to place and time. Recent and remote memory intact. Attention span, concentration and fund of knowledge appropriate. Mood and affect appropriate.  Diminished recall 2/3.  Animal naming test 11.  Clock drawing 4/4. Cranial Nerves: Fundoscopic exam reveals sharp disc margins. Pupils equal, briskly reactive to light. Extraocular movements full without nystagmus. Visual fields full to confrontation. Hearing intact. Facial sensation intact. Face, tongue, palate moves normally and symmetrically.  Motor: Normal bulk and tone. Normal strength in all tested extremity muscles.  Diminished fine finger movements on the left.  Orbits right over left upper extremity. Sensory.: intact to touch ,pinprick .position and vibratory sensation.  Coordination: Rapid alternating movements normal in all extremities. Finger-to-nose and heel-to-shin performed accurately bilaterally. Gait and Station: Arises from chair without difficulty. Stance is normal. Gait demonstrates normal stride length and balance . Able to heel, toe and tandem walk with slight difficulty.  Reflexes: 1+ and symmetric. Toes downgoing.   NIHSS  0 Modified Rankin  2   ASSESSMENT: 67 year old African-American male with recurrent cryptogenic right hemispheric infarcts in April 2023.  Vascular risk factors of smoking, diabetes, hypertension, hyperlipidemia, aortic arch atherosclerosis and small PFO.  Patient is participating in the [facility] stroke research study     PLAN: I had a long  d/w patient  and his wife about his recent  recurrent cryptogenic strokes, posterior neck pain and mild cognitive impairment, risk for recurrent stroke/TIAs, personally independently reviewed imaging studies and stroke evaluation results and answered questions.Continue aspirin 81 mg  and Plavix daily and [employer] Stroke study medication once daily( asundexian 50 mg versus Placebo )  for secondary stroke prevention and maintain strict control of hypertension with blood pressure goal below  130/90, diabetes with hemoglobin A1c goal below 6.5% and lipids with LDL cholesterol goal below 70 mg/dL.  Patient was found to have a small PFO but it is likely clinically insignificant since TCD bubble study was negative and due to his low ROPE score he is not a candidate for endovascular PFO closure.  I also advised the patient to eat a healthy diet with plenty of whole grains, cereals, fruits and vegetables, exercise regularly and maintain ideal body weight . Check screening carotid ultrasound. Return for follow-up in the future in 6 months and keep scheduled follow-up with the [facility] stroke research study appointment.   Greater than 50% of time during this 35 minute visit was spent on counseling,explanation of diagnosis of cryptogenic stroke, planning of further management, discussion with patient and family and coordination of care Delia Heady, MD Note: This document was prepared with digital dictation and possible smart phrase technology. Any transcriptional errors that result from this process are unintentional

## 2022-10-24 NOTE — Patient Instructions (Addendum)
I had a long d/w patient  and his wife about his recent  recurrent cryptogenic strokes, posterior neck pain and mild cognitive impairment, risk for recurrent stroke/TIAs, personally independently reviewed imaging studies and stroke evaluation results and answered questions.Continue aspirin 81 mg  and Plavix daily and [facility] Stroke study medication once daily( asundexian 50 mg versus Placebo )  for secondary stroke prevention and maintain strict control of hypertension with blood pressure goal below 130/90, diabetes with hemoglobin A1c goal below 6.5% and lipids with LDL cholesterol goal below 70 mg/dL.  Patient was found to have a small PFO but it is likely clinically insignificant since TCD bubble study was negative and due to his low ROPE score he is not a candidate for endovascular PFO closure.  I also advised the patient to eat a healthy diet with plenty of whole grains, cereals, fruits and vegetables, exercise regularly and maintain ideal body weight . Check screening carotid ultrasound. Return for follow-up in the future in 6 months and keep scheduled follow-up with the [facility] stroke research study appointment.

## 2022-11-11 ENCOUNTER — Ambulatory Visit (INDEPENDENT_AMBULATORY_CARE_PROVIDER_SITE_OTHER): Payer: No Typology Code available for payment source

## 2022-11-11 DIAGNOSIS — I639 Cerebral infarction, unspecified: Secondary | ICD-10-CM | POA: Diagnosis not present

## 2022-11-11 LAB — CUP PACEART REMOTE DEVICE CHECK
Date Time Interrogation Session: 20240503230859
Implantable Pulse Generator Implant Date: 20230417

## 2022-11-13 NOTE — Progress Notes (Signed)
Carelink Summary Report / Loop Recorder 

## 2022-11-15 ENCOUNTER — Ambulatory Visit (HOSPITAL_COMMUNITY)
Admission: RE | Admit: 2022-11-15 | Discharge: 2022-11-15 | Disposition: A | Discharge status: Home / Self Care | Payer: No Typology Code available for payment source | Source: Ambulatory Visit | Attending: Neurology | Admitting: Neurology

## 2022-11-15 ENCOUNTER — Ambulatory Visit (HOSPITAL_COMMUNITY): Payer: Self-pay | Admitting: Student in an Organized Health Care Education/Training Program

## 2022-11-15 ENCOUNTER — Encounter (HOSPITAL_COMMUNITY): Payer: Self-pay | Admitting: Student in an Organized Health Care Education/Training Program

## 2022-11-15 DIAGNOSIS — Z8673 Personal history of transient ischemic attack (TIA), and cerebral infarction without residual deficits: Secondary | ICD-10-CM

## 2022-11-15 DIAGNOSIS — F332 Major depressive disorder, recurrent severe without psychotic features: Secondary | ICD-10-CM

## 2022-11-15 MED ORDER — TRAZODONE HCL 100 MG PO TABS: 100.0000 mg | ORAL_TABLET | Freq: Every evening | ORAL | 3 refills | Status: DC | PRN

## 2022-11-15 MED ORDER — SERTRALINE HCL 50 MG PO TABS: 150.0000 mg | ORAL_TABLET | Freq: Every day | ORAL | 2 refills | Status: DC

## 2022-11-15 NOTE — Progress Notes (Signed)
Carotid artery duplex has been completed. Preliminary results can be found in CV Proc through chart review.   11/15/22 3:24 PM Olen Cordial RVT

## 2022-11-15 NOTE — Progress Notes (Signed)
[facility] [address] [place] [address] Dept: [phone] Dept Fax: [phone]  Psychotherapy Progress Note  Patient ID: Jerry [name], male  DOB: [birth year], 67 y.o.  MRN: [id number]  11/15/2022 Start time: 11:15A End time: 11:54A  Method of Visit: Face-to-Face   Present: alone    Current Concerns: Anxiety and irritability worsened  Current Symptoms: Irritability  Psychiatric Specialty Exam: General Appearance: Casual wearing sun glasses due to vision  Eye Contact:  Good  Speech:  Clear and Coherent  Volume:  Normal  Mood:   Frustrated but easily able to calm self down  Affect:  Appropriate  Thought Process:  Coherent  Orientation:  Full (Time, Place, and Person)  Thought Content:  Logical  Suicidal Thoughts:  No  Homicidal Thoughts:  No  Memory:  Immediate;   Good Recent;   Good  Judgement:  Good  Insight:  Fair  Psychomotor Activity:  Normal  Concentration:  Concentration: Good  Recall:  NA  Fund of Knowledge:Good  Language: Good  Akathisia:  NA  Handed:    AIMS (if indicated):  not done  Assets:  Communication Skills Desire for Improvement Housing Intimacy Leisure Time Resilience Social Support Transportation Vocational/Educational  ADL's:  Intact  Cognition: WNL  Sleep:  Good     Diagnosis: MDD, recurrent, in remission Complex grief Patient endorses increased irritability and anxiety.  Patient reports that sometimes he can start shaking, sweating and have the urge to hit something.  He feels better after going to the gym.  He does endorse that the Zoloft had previously been helpful for decreasing these symptoms.  He endorses he is eating and sleeping well.  Patient denies SI, HI and AVH.  Patient is compliant with the following regimen: Zoloft 100 mg daily Trazodone 100 mg nightly  Anticipated Frequency of Visits: Monthly Anticipated Length of Treatment Episode: 2 years  Short  Term Goals/Goals for Treatment Session:   1.  Identify emotions felt due to external stressors and situations 2.  Continue to use coping skills such as exercising and prayer 3.  Continue to set boundaries with family members and work on setting more boundaries in order to have more "personal space" 4.  Not acting impulsively due to feelings of anger  Progress Towards Goals: Progressing as evidenced by patient endorsing that despite being shocked by what he is finding out about 2 of his sisters and how they have treated him and his other siblings, he is not being impulsive and lashing out.  Patient endorses that while internally he is feeling "anxiety and anger" he is going to the gym and using prayer and this helps him feel better in the moment.  The situation at hand is related to how 2 of his sisters handled the decline of his mother and her death.  Patient endorses finding out shocking information due to some of his other sisters investigating things.  These other sisters will be involving legal process/investigation and he is in support of the sisters as he may have been wronged as well.  Patient continues to work on processing this new information and is now reaching out and having meetings with his other siblings.  He is also encouraging them to not act impulsively as he has done in the past.  In regards to upcoming Mother's Day, patient has set boundaries with his 2 older sisters who he is presently upset with, and notified them that while he will participate in the family dinner  he has no intention of going to the grave site as this is uncomfortable for him.  Patient endorses that he is sticking to this does not dwell on not going to the grave site, beyond "I just do not think it is necessary."  Patient also continues to spend time with his daughter and continues to feel a sense of purpose and responsibility toward his family members.  Treatment Intervention: Supportive therapy  Medical  Necessity: Improved patient condition  Assessment Tools:    08/02/2022   10:27 AM 04/29/2022    2:49 PM 02/19/2022    4:15 PM  Depression screen PHQ 2/9  Decreased Interest 1 2 3   Down, Depressed, Hopeless 2 1 3   PHQ - 2 Score 3 3 6   Altered sleeping 2 3 0  Tired, decreased energy 2 2 3   Change in appetite 2 0 0  Feeling bad or failure about yourself  1 0 2  Trouble concentrating 2 3 3   Moving slowly or fidgety/restless 0 0 0  Suicidal thoughts 0 0 1  PHQ-9 Score 12 11 15    Failed to redirect to the Timeline version of the REVFS SmartLink. Flowsheet Row Counselor from 01/21/2022 in [facility] ED to Hosp-Admission (Discharged) from 10/31/2021 in [facility] ED to Hosp-Admission (Discharged) from 10/20/2021 in [facility]  C-SSRS RISK CATEGORY Error: Q3, 4, or 5 should not be populated when Q2 is No No Risk No Risk       Collaboration of Care:   Patient/Guardian was advised Release of Information must be obtained prior to any record release in order to collaborate their care with an outside provider. Patient/Guardian was advised if they have not already done so to contact the registration department to sign all necessary forms in order for [place] to release information regarding their care.   Consent: Patient/Guardian gives verbal consent for treatment and assignment of benefits for services provided during this visit. Patient/Guardian expressed understanding and agreed to proceed.   Plan:   Will increase patient's Zoloft back to 150 mg.  Patient's labs indicate better controlled BGL, and no hyponatremia.  At last visit when sodium was corrected there was no hyponatremia however went ahead and decided to decrease Zoloft.  At this time it does appear the patient may do better on an increased dose as the irritability has experienced may decrease with Zoloft.  Patient does continue to use coping skills appropriately and  continues to see benefit.  Patient is not endorsing clinical major depression and his irritability at this time appears well warranted.  However, would like to prevent further decompensation as patient's irritability has made him more anxious and impulsive in the past.  The matter at hand that is stressing patient is an extra obstacle as he has moved through the process of grieving his mother's death.  He now appears to be endorsing hurt/anger as well as disappointment and disbelief at what may have been occurring without his knowledge as his mother declined.  We will continue to follow with patient.  Of note patient had a PHQ-9 done with [facility] where his score was 7 (08/29/2022).  MDD, recurrent mild-in remission Complex grief - Increase Zoloft to 150 mg daily - Continue trazodone 100 mg nightly  PGY-3 Bobbye Morton, MD 11/15/2022

## 2022-11-22 NOTE — Progress Notes (Signed)
Kindly inform the patient that carotid ultrasound study shows no significant narrowing of either carotid artery in the neck

## 2022-12-09 ENCOUNTER — Ambulatory Visit: Payer: No Typology Code available for payment source | Attending: Internal Medicine | Admitting: Internal Medicine

## 2022-12-09 ENCOUNTER — Encounter: Payer: Self-pay | Admitting: Internal Medicine

## 2022-12-09 VITALS — BP 140/80 | HR 67 | Ht 76.0 in | Wt 280.0 lb

## 2022-12-09 DIAGNOSIS — I639 Cerebral infarction, unspecified: Secondary | ICD-10-CM | POA: Diagnosis not present

## 2022-12-09 NOTE — Patient Instructions (Signed)
Medication Instructions:  No Changes In Medications at this time.  *If you need a refill on your cardiac medications before your next appointment, please call your pharmacy*  Lab Work: None Ordered At This Time.  If you have labs (blood work) drawn today and your tests are completely normal, you will receive your results only by: MyChart Message (if you have MyChart) OR A paper copy in the mail If you have any lab test that is abnormal or we need to change your treatment, we will call you to review the results.  Testing/Procedures: None Ordered At This Time.   Follow-Up: At [facility], you and your health needs are our priority.  As part of our continuing mission to provide you with exceptional heart care, we have created designated Provider Care Teams.  These Care Teams include your primary Cardiologist (physician) and Advanced Practice Providers (APPs -  Physician Assistants and Nurse Practitioners) who all work together to provide you with the care you need, when you need it.  Your next appointment:   1 year(s)  Provider:   Branch, Mary E, MD   

## 2022-12-09 NOTE — Progress Notes (Signed)
Cardiology Office Note:    Date:  12/09/2022   ID:  Jerry [name], DOB 08-Apr-1956, MRN [id number]  PCP:  Mattie Marlin, DO   [facility] Providers Cardiologist:  Maisie Fus, MD     Referring MD: Mattie Marlin, DO   No chief complaint on file. Stroke  History of Present Illness:    Jerry [name] is a 67 y.o. 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 Presented with worsening lower extremity weakness and ataxia. MR brain with acute/subacute nonhemorrhagic infarct involving the right caudate head, right middle lobe, right frontal lobe. TTE did not show PFO with agitated bubble study.  A loop recorder placed was inserted by Dr. Graciela Husbands. He presented again with tingling and numbness of the left side of the face. His loop recorder was interrogated and did not show afib. He had an MRI brain showed which showed features concerning for possible embolic stroke involving the right cerebral hemisphere. He underwent TEE. Did not show significant PFO. Showed significant atheroma in the thoracic aorta. Managed with asa/brilinta for 4 weeks and then asa alone.  No evidence of afib. He was also enrolled in a stroke study above.  Interim Hx 12/04/2021:   He stopped smoking after smoking since he was a teenager! Blood pressures at home are well controlled. He feels well with no significant deficits. He comes in with his wife. Prior to his stroke they have endured a lot of stress. Multiple family members passed away. Also, his wife was in a nursing facility for four years.  Interim hx 12/08/2021 No afib.  No hospitalizations.  No chest pain or SOB. No LH. Blood pressures 126/80-140.   Cardiac studies: Cath 2014- non obstructive dx. Indication abnormal myoview. TTE 10/21/2021- normal study.    Past Medical History:   Diagnosis Date   Arthritis    Diabetes mellitus 2008   type II   Dyslipidemia    Erectile dysfunction    History of cardiac catheterization 06/2013   Dr. Peter Swaziland   HTN (hypertension)    Hypogonadism male     Past Surgical History:  Procedure Laterality Date   APPENDECTOMY     BUBBLE STUDY  11/02/2021   Procedure: BUBBLE STUDY;  Surgeon: Maisie Fus, MD;  Location: [facility] ENDOSCOPY;  Service: Cardiovascular;;   COLONOSCOPY  08/2006   Dr. Russella Dar - repeat 2018   INCISION AND DRAINAGE PERIRECTAL ABSCESS     KNEE ARTHROSCOPY Left 12/28/2014   Procedure: ARTHROSCOPY LEFT KNEE WITH  MEDIAL MENSICUS DEBRIDEMENT;  Surgeon: Ollen Gross, MD;  Location: [facility] ORS;  Service: Orthopedics;  Laterality: Left;   KNEE SURGERY     left knee - remote past   LEFT HEART CATHETERIZATION WITH CORONARY ANGIOGRAM N/A 06/11/2013   Procedure: LEFT HEART CATHETERIZATION WITH CORONARY ANGIOGRAM;  Surgeon: Peter M Swaziland, MD;  Location: [facility] CATH LAB;  Service: Cardiovascular;  Laterality: N/A;   LOOP RECORDER INSERTION N/A 10/22/2021   Procedure: LOOP RECORDER INSERTION;  Surgeon: Duke Salvia, MD;  Location: [facility] INVASIVE CV LAB;  Service: Cardiovascular;  Laterality: N/A;   right arm and elbow surgery     TEE WITHOUT CARDIOVERSION N/A 11/02/2021   Procedure: TRANSESOPHAGEAL ECHOCARDIOGRAM (TEE);  Surgeon: Maisie Fus, MD;  Location: [facility] ENDOSCOPY;  Service: Cardiovascular;  Laterality: N/A;  TONSILLECTOMY     TOTAL KNEE ARTHROPLASTY Left 10/04/2015   Procedure: LEFT TOTAL KNEE ARTHROPLASTY right knee aspiration and injection;  Surgeon: Ollen Gross, MD;  Location: [facility] ORS;  Service: Orthopedics;  Laterality: Left;   TOTAL KNEE ARTHROPLASTY Right 11/04/2016   Procedure: RIGHT TOTAL KNEE ARTHROPLASTY;  Surgeon: Ollen Gross, MD;  Location: [facility] ORS;  Service: Orthopedics;  Laterality: Right;  with abductor block   wisdom teeth extractions      Current Medications: Current Outpatient Medications on File Prior  to Visit  Medication Sig Dispense Refill   acetaminophen (TYLENOL) 500 MG tablet Take 1,000 mg by mouth every 6 (six) hours as needed for mild pain.     aspirin EC 81 MG tablet Take 1 tablet (81 mg total) by mouth daily. Swallow whole. 30 tablet    Blood Glucose Monitoring Suppl (GLUCOCOM BLOOD GLUCOSE MONITOR) DEVI Please check blood sugar 5 times per day due to fluctuating blood sugars.     clopidogrel (PLAVIX) 75 MG tablet Take 1 tablet (75 mg total) by mouth daily. 90 tablet 3   docusate sodium (COLACE) 100 MG capsule Take 1 capsule (100 mg total) by mouth every 12 (twelve) hours. (Patient taking differently: Take 100 mg by mouth 2 (two) times daily as needed for moderate constipation.) 30 capsule 0   ezetimibe (ZETIA) 10 MG tablet Take 1 tablet (10 mg total) by mouth daily. 90 tablet 3   gabapentin (NEURONTIN) 300 MG capsule Take 1 capsule (300 mg total) by mouth 3 (three) times daily. 60 capsule 2   glucose blood test strip 2 times per day     HYDROcodone-acetaminophen (NORCO/VICODIN) 5-325 MG tablet Take 1 tablet by mouth as needed.     Insulin Glargine (LANTUS SOLOSTAR) 100 UNIT/ML Solostar Pen Inject 15 Units into the skin 2 (two) times daily. (Patient taking differently: Inject 15 Units into the skin at bedtime.) 15 mL 3   ketorolac (ACULAR) 0.5 % ophthalmic solution Place 1 drop into the right eye as needed.     metFORMIN (GLUCOPHAGE) 1000 MG tablet Take 1 tablet (1,000 mg total) by mouth daily. (Patient taking differently: Take 1,000 mg by mouth 2 (two) times daily with a meal.) 180 tablet 0   rosuvastatin (CRESTOR) 40 MG tablet Take 40 mg by mouth daily.     sertraline (ZOLOFT) 50 MG tablet Take 3 tablets (150 mg total) by mouth daily. 90 tablet 2   Study - OCEANIC-STROKE - asundexian 50 mg or placebo tablet (PI-Sethi) Take 1 tablet (50 mg total) by mouth daily. For Investigational Use Only. Take at the same time each day (preferably in the morning). Tablet should be swallowed whole with  water; it CANNOT be crushed or broken. Please contact [facility] if you have any questions regarding this medication or study. 1 tablet 0   traZODone (DESYREL) 100 MG tablet Take 1 tablet (100 mg total) by mouth at bedtime as needed for sleep. 30 tablet 3   ibuprofen (ADVIL) 200 MG tablet Take 400 mg by mouth every 6 (six) hours as needed for headache or moderate pain. (Patient not taking: Reported on 12/09/2022)     melatonin 5 MG TABS Take 5 mg by mouth at bedtime as needed (sleep). (Patient not taking: Reported on 12/09/2022)     Multiple Vitamins-Minerals (CENTRUM SILVER 50+MEN) TABS Take 1 tablet by mouth daily. (Patient not taking: Reported on 12/09/2022)     No current facility-administered medications on file prior to visit.  Allergies:   Diclofenac sodium, Lisinopril, and Losartan   Social History   Socioeconomic History   Marital status: Married    Spouse name: Not on file   Number of children: Not on file   Years of education: Not on file   Highest education level: Not on file  Occupational History   Occupation: retired    Comment: prior physical therapist  Tobacco Use   Smoking status: Every Day    Packs/day: .25    Types: Cigarettes   Smokeless tobacco: Never   Tobacco comments:    Started smoking again, 1 pack every 4 days  Substance and Sexual Activity   Alcohol use: No   Drug use: No   Sexual activity: Never  Other Topics Concern   Not on file  Social History Narrative   Married 22 years, 2 daughter and 1 son. Retired, used to work as a PT.  Exercises treadmill daily, some weight bearing exercise. Diet    Social Determinants of Health   Financial Resource Strain: Not on file  Food Insecurity: Not on file  Transportation Needs: Not on file  Physical Activity: Not on file  Stress: Not on file  Social Connections: Not on file     Family History: The patient's family history includes Diabetes in his mother; Other in his brother and father.  There is no history of Stroke, Heart disease, Cancer, or Hypertension.  ROS:   Please see the history of present illness.  All other systems reviewed and are negative.  EKGs/Labs/Other Studies Reviewed:    The following studies were reviewed today:   EKG:  EKG is  ordered today.  The ekg ordered today demonstrates   NSR  Recent Labs: 02/05/2022: BUN 12; Creatinine, Ser 0.89; Potassium 4.1; Sodium 141   Recent Lipid Panel    Component Value Date/Time   CHOL 163 10/21/2021 0406   TRIG 84 10/21/2021 0406   HDL 58 10/21/2021 0406   CHOLHDL 2.8 10/21/2021 0406   VLDL 17 10/21/2021 0406   LDLCALC 88 10/21/2021 0406     Risk Assessment/Calculations:           Physical Exam:    VS: Vitals:   12/09/22 1419  BP: (!) 140/80  Pulse: 67  SpO2: 96%     Wt Readings from Last 3 Encounters:  12/09/22 280 lb (127 kg)  10/24/22 283 lb 3.2 oz (128.5 kg)  09/02/22 283 lb (128.4 kg)     GEN:  Well nourished, well developed in no acute distress HEENT: Normal NECK: No JVD; No carotid bruits LYMPHATICS: No lymphadenopathy CARDIAC: RRR, no murmurs, rubs, gallops RESPIRATORY:  Clear to auscultation without rales, wheezing or rhonchi  ABDOMEN: Soft, non-tender, non-distended MUSCULOSKELETAL:  No edema; No deformity  SKIN: Warm and dry NEUROLOGIC:  Alert and oriented x 3 PSYCHIATRIC:  Normal affect   ASSESSMENT:    Ischemic CVA/Aortic Atheroma w/ thrombus: TEE did not show significant PFO. He has grade IV atheroma , this was possibly the embolic source. LDL goal < 70 mg/dL; LDL is 54 mg/dL 1/61/0960. He stopped smoking which is great. A1c goal < 7. Followed by vascular, this is stable. 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 Placed 10/22/2021. No afib on prior interrogations  HTN: ok control. Continue his current regimen  PLAN:    In order of problems listed above:    Follow up in  12 months  Medication Adjustments/Labs and Tests Ordered: Current medicines are reviewed at length with the patient today.  Concerns regarding medicines are outlined above.  Orders Placed This Encounter  Procedures   EKG 12-Lead   No orders of the defined types were placed in this encounter.   Patient Instructions  Medication Instructions:  No Changes In Medications at this time.   *If you need a refill on your cardiac medications before your next appointment, please call your pharmacy*  Lab Work: None Ordered At This Time.   If you have labs (blood work) drawn today and your tests are completely normal, you will receive your results only by: MyChart Message (if you have MyChart) OR A paper copy in the mail If you have any lab test that is abnormal or we need to change your treatment, we will call you to review the results.  Testing/Procedures: None Ordered At This Time.    Follow-Up: At [facility], you and your health needs are our priority.  As part of our continuing mission to provide you with exceptional heart care, we have created designated Provider Care Teams.  These Care Teams include your primary Cardiologist (physician) and Advanced Practice Providers (APPs -  Physician Assistants and Nurse Practitioners) who all work together to provide you with the care you need, when you need it.  Your next appointment:   1 year(s)  Provider:   Maisie Fus, MD      Signed, Maisie Fus, MD  12/09/2022 2:51 PM    [facility]

## 2022-12-10 NOTE — Progress Notes (Signed)
Carelink Summary Report / Loop Recorder 

## 2022-12-16 ENCOUNTER — Ambulatory Visit (INDEPENDENT_AMBULATORY_CARE_PROVIDER_SITE_OTHER): Payer: Medicare HMO

## 2022-12-16 DIAGNOSIS — I639 Cerebral infarction, unspecified: Secondary | ICD-10-CM

## 2022-12-16 DIAGNOSIS — H43821 Vitreomacular adhesion, right eye: Secondary | ICD-10-CM | POA: Diagnosis not present

## 2022-12-16 DIAGNOSIS — H35033 Hypertensive retinopathy, bilateral: Secondary | ICD-10-CM | POA: Diagnosis not present

## 2022-12-16 DIAGNOSIS — H34231 Retinal artery branch occlusion, right eye: Secondary | ICD-10-CM | POA: Diagnosis not present

## 2022-12-16 DIAGNOSIS — H20043 Secondary noninfectious iridocyclitis, bilateral: Secondary | ICD-10-CM | POA: Diagnosis not present

## 2022-12-16 DIAGNOSIS — H43812 Vitreous degeneration, left eye: Secondary | ICD-10-CM | POA: Diagnosis not present

## 2022-12-16 DIAGNOSIS — E113513 Type 2 diabetes mellitus with proliferative diabetic retinopathy with macular edema, bilateral: Secondary | ICD-10-CM | POA: Diagnosis not present

## 2022-12-16 LAB — CUP PACEART REMOTE DEVICE CHECK
Date Time Interrogation Session: 20240609231318
Implantable Pulse Generator Implant Date: 20230417

## 2022-12-26 DIAGNOSIS — F32A Depression, unspecified: Secondary | ICD-10-CM | POA: Diagnosis not present

## 2022-12-26 DIAGNOSIS — G8929 Other chronic pain: Secondary | ICD-10-CM | POA: Diagnosis not present

## 2022-12-26 DIAGNOSIS — R790 Abnormal level of blood mineral: Secondary | ICD-10-CM | POA: Diagnosis not present

## 2022-12-26 DIAGNOSIS — R0683 Snoring: Secondary | ICD-10-CM | POA: Diagnosis not present

## 2022-12-26 DIAGNOSIS — E785 Hyperlipidemia, unspecified: Secondary | ICD-10-CM | POA: Diagnosis not present

## 2022-12-26 DIAGNOSIS — Z8673 Personal history of transient ischemic attack (TIA), and cerebral infarction without residual deficits: Secondary | ICD-10-CM | POA: Diagnosis not present

## 2022-12-26 DIAGNOSIS — I10 Essential (primary) hypertension: Secondary | ICD-10-CM | POA: Diagnosis not present

## 2022-12-26 DIAGNOSIS — R32 Unspecified urinary incontinence: Secondary | ICD-10-CM | POA: Diagnosis not present

## 2022-12-26 DIAGNOSIS — E119 Type 2 diabetes mellitus without complications: Secondary | ICD-10-CM | POA: Diagnosis not present

## 2023-01-06 DIAGNOSIS — H43812 Vitreous degeneration, left eye: Secondary | ICD-10-CM | POA: Diagnosis not present

## 2023-01-06 DIAGNOSIS — H34231 Retinal artery branch occlusion, right eye: Secondary | ICD-10-CM | POA: Diagnosis not present

## 2023-01-06 DIAGNOSIS — E113513 Type 2 diabetes mellitus with proliferative diabetic retinopathy with macular edema, bilateral: Secondary | ICD-10-CM | POA: Diagnosis not present

## 2023-01-06 DIAGNOSIS — H43821 Vitreomacular adhesion, right eye: Secondary | ICD-10-CM | POA: Diagnosis not present

## 2023-01-06 DIAGNOSIS — H35033 Hypertensive retinopathy, bilateral: Secondary | ICD-10-CM | POA: Diagnosis not present

## 2023-01-06 DIAGNOSIS — H20043 Secondary noninfectious iridocyclitis, bilateral: Secondary | ICD-10-CM | POA: Diagnosis not present

## 2023-01-07 NOTE — Progress Notes (Signed)
Carelink Summary Report / Loop Recorder 

## 2023-01-10 ENCOUNTER — Ambulatory Visit (HOSPITAL_COMMUNITY): Payer: Medicare HMO | Admitting: Student in an Organized Health Care Education/Training Program

## 2023-01-20 ENCOUNTER — Ambulatory Visit (INDEPENDENT_AMBULATORY_CARE_PROVIDER_SITE_OTHER): Payer: Medicare HMO

## 2023-01-20 DIAGNOSIS — I639 Cerebral infarction, unspecified: Secondary | ICD-10-CM | POA: Diagnosis not present

## 2023-01-21 LAB — CUP PACEART REMOTE DEVICE CHECK
Date Time Interrogation Session: 20240712230523
Implantable Pulse Generator Implant Date: 20230417

## 2023-02-03 NOTE — Progress Notes (Signed)
Carelink Summary Report / Loop Recorder 

## 2023-02-24 ENCOUNTER — Ambulatory Visit (INDEPENDENT_AMBULATORY_CARE_PROVIDER_SITE_OTHER): Payer: Medicare HMO

## 2023-02-24 DIAGNOSIS — I639 Cerebral infarction, unspecified: Secondary | ICD-10-CM

## 2023-02-24 LAB — CUP PACEART REMOTE DEVICE CHECK
Date Time Interrogation Session: 20240818231529
Implantable Pulse Generator Implant Date: 20230417

## 2023-02-28 DIAGNOSIS — H35033 Hypertensive retinopathy, bilateral: Secondary | ICD-10-CM | POA: Diagnosis not present

## 2023-02-28 DIAGNOSIS — H43821 Vitreomacular adhesion, right eye: Secondary | ICD-10-CM | POA: Diagnosis not present

## 2023-02-28 DIAGNOSIS — E113513 Type 2 diabetes mellitus with proliferative diabetic retinopathy with macular edema, bilateral: Secondary | ICD-10-CM | POA: Diagnosis not present

## 2023-02-28 DIAGNOSIS — H34231 Retinal artery branch occlusion, right eye: Secondary | ICD-10-CM | POA: Diagnosis not present

## 2023-02-28 DIAGNOSIS — H20043 Secondary noninfectious iridocyclitis, bilateral: Secondary | ICD-10-CM | POA: Diagnosis not present

## 2023-02-28 DIAGNOSIS — H43812 Vitreous degeneration, left eye: Secondary | ICD-10-CM | POA: Diagnosis not present

## 2023-03-06 NOTE — Progress Notes (Signed)
Carelink Summary Report / Loop Recorder 

## 2023-03-07 ENCOUNTER — Ambulatory Visit (HOSPITAL_COMMUNITY): Payer: Medicare HMO | Admitting: Student in an Organized Health Care Education/Training Program

## 2023-03-07 VITALS — BP 123/84 | HR 99 | Resp 16 | Wt 251.0 lb

## 2023-03-07 DIAGNOSIS — F3341 Major depressive disorder, recurrent, in partial remission: Secondary | ICD-10-CM

## 2023-03-07 DIAGNOSIS — F332 Major depressive disorder, recurrent severe without psychotic features: Secondary | ICD-10-CM

## 2023-03-07 DIAGNOSIS — F4321 Adjustment disorder with depressed mood: Secondary | ICD-10-CM

## 2023-03-07 MED ORDER — SERTRALINE HCL 50 MG PO TABS: 150.0000 mg | ORAL_TABLET | Freq: Every day | ORAL | 2 refills | Status: DC

## 2023-03-07 MED ORDER — TRAZODONE HCL 100 MG PO TABS
100.0000 mg | ORAL_TABLET | Freq: Every evening | ORAL | 2 refills | Status: DC | PRN
Start: 2023-03-07 — End: 2023-05-23

## 2023-03-07 NOTE — Progress Notes (Signed)
BH MD/PA/NP OP Progress Note  03/07/2023 9:55 AM Jerry [name]  MRN:  [id number]  Chief Complaint:  Chief Complaint  Patient presents with   Follow-up   HPI: Jerry [name] is a 67 year old patient with a past psychiatric history of reported PTSD, MDD, GAD who presents today for medication management.  Current medication regimen: Zoloft 150 mg daily Trazodone 100 mg nightly  On assessment today patient reports he is compliant with his medication regimen.  Patient endorses happiness at his recent weight loss.  Patient reports he is working out frequently and hour-long spurts.  Patient endorses that he has good energy until after he works out.  Patient reports that his mood has been overall euthymic.  Patient reports that he feels he is "accepted" the passing of his mother and has decided to not be as involved with his siblings regarding dealing with concerns about how mother's care was handled.  Patient reports he is overall taking the step back and does not want to be stressed any longer.  Patient reports that he is interested in going back to watching sporting events as the temperature cools down.  Patient reports he continues to pray and communicate with his wife.  Patient endorses that these are the biggest changes that have helped him cope.  Unfortunately patient's socialization has been limited due to a lot of the church organizations becoming virtual secondary to a COVID outbreak.  Patient reports that his biggest stressor currently is working on getting independent housing.  Patient endorses while his daughter is not brushing him out of the home he would like to have his own privacy.  Patient reports that he has been doing his best to not get angry and is trying to remain calm however, there have been issues since the transition from [facility] to [place] with the [facility] program.  Despite this patient reports overall he feels calm.  Patient reports that he feels he communicates better with his  wife.  Patient reports that he is averaging around 2 mL a day.  Patient denies SI, HI and AVH.  Patient reports that he feels that he is dealing with his health much better than a few months ago.  Patient reports he is less reactive and less dysphoric overall regarding his health.  Patient reports he also feels that he has come to some acceptance regarding overall health decline but is feeling better since working out and losing weight.  Patient denies any anhedonia or feeling hopeless.  Patient reports he is sleeping well with the trazodone. Visit Diagnosis:    ICD-10-CM   1. MDD (major depressive disorder), recurrent, in partial remission (HCC)  F33.41     2. Severe episode of recurrent major depressive disorder, without psychotic features (HCC)  F33.2 traZODone (DESYREL) 100 MG tablet    sertraline (ZOLOFT) 50 MG tablet    3. Complicated grief  F43.21       Past Psychiatric History: Previously seen at [facility] for therapy and medication management; diagnosed with PTSD, MDD and GAD.  Completed therapy with Dr. Morrie Sheldon 01/24/2022 - 11/25/2022.  Continues medication management. - Previous medications include trazodone and Lexapro  No history of inpatient psychiatric care - No history of suicide attempts    Past Medical History:  Past Medical History:  Diagnosis Date   Arthritis    Diabetes mellitus 2008   type II   Dyslipidemia    Erectile dysfunction    History of cardiac catheterization 06/2013   Dr. Peter Swaziland  HTN (hypertension)    Hypogonadism male     Past Surgical History:  Procedure Laterality Date   APPENDECTOMY     BUBBLE STUDY  11/02/2021   Procedure: BUBBLE STUDY;  Surgeon: Maisie Fus, MD;  Location: [facility] ENDOSCOPY;  Service: Cardiovascular;;   COLONOSCOPY  08/2006   Dr. Russella Dar - repeat 2018   INCISION AND DRAINAGE PERIRECTAL ABSCESS     KNEE ARTHROSCOPY Left 12/28/2014   Procedure: ARTHROSCOPY LEFT KNEE WITH  MEDIAL MENSICUS DEBRIDEMENT;  Surgeon: Ollen Gross, MD;  Location: [facility] ORS;  Service: Orthopedics;  Laterality: Left;   KNEE SURGERY     left knee - remote past   LEFT HEART CATHETERIZATION WITH CORONARY ANGIOGRAM N/A 06/11/2013   Procedure: LEFT HEART CATHETERIZATION WITH CORONARY ANGIOGRAM;  Surgeon: Peter M Swaziland, MD;  Location: [facility] CATH LAB;  Service: Cardiovascular;  Laterality: N/A;   LOOP RECORDER INSERTION N/A 10/22/2021   Procedure: LOOP RECORDER INSERTION;  Surgeon: Duke Salvia, MD;  Location: [facility] INVASIVE CV LAB;  Service: Cardiovascular;  Laterality: N/A;   right arm and elbow surgery     TEE WITHOUT CARDIOVERSION N/A 11/02/2021   Procedure: TRANSESOPHAGEAL ECHOCARDIOGRAM (TEE);  Surgeon: Maisie Fus, MD;  Location: [facility] ENDOSCOPY;  Service: Cardiovascular;  Laterality: N/A;   TONSILLECTOMY     TOTAL KNEE ARTHROPLASTY Left 10/04/2015   Procedure: LEFT TOTAL KNEE ARTHROPLASTY right knee aspiration and injection;  Surgeon: Ollen Gross, MD;  Location: [facility] ORS;  Service: Orthopedics;  Laterality: Left;   TOTAL KNEE ARTHROPLASTY Right 11/04/2016   Procedure: RIGHT TOTAL KNEE ARTHROPLASTY;  Surgeon: Ollen Gross, MD;  Location: [facility] ORS;  Service: Orthopedics;  Laterality: Right;  with abductor block   wisdom teeth extractions      Family Psychiatric History: Granddaughter: Deceased, substance use disorder, bipolar, PTSD - History of depression in multiple sisters - Oldest sister: EtOH use disorder Mother: Dementia   Family History:  Family History  Problem Relation Age of Onset   Diabetes Mother    Other Father        death by MVA   Other Brother        died of MVA   Stroke Neg Hx    Heart disease Neg Hx    Cancer Neg Hx    Hypertension Neg Hx     Social History:  Social History   Socioeconomic History   Marital status: Married    Spouse name: Not on file   Number of children: Not on file   Years of education: Not on file   Highest education level: Not on file  Occupational History   Occupation: retired     Comment: prior physical therapist  Tobacco Use   Smoking status: Every Day    Current packs/day: 0.25    Types: Cigarettes   Smokeless tobacco: Never   Tobacco comments:    Started smoking again, 1 pack every 4 days  Substance and Sexual Activity   Alcohol use: No   Drug use: No   Sexual activity: Never  Other Topics Concern   Not on file  Social History Narrative   Married 22 years, 2 daughter and 1 son. Retired, used to work as a PT.  Exercises treadmill daily, some weight bearing exercise. Diet    Social Determinants of Health   Financial Resource Strain: Not on file  Food Insecurity: Not on file  Transportation Needs: Not on file  Physical Activity: Not on file  Stress: Not on file  Social  Connections: Not on file    Allergies:  Allergies  Allergen Reactions   Diclofenac Sodium Other (See Comments)    Gel caused a burning sensation    Lisinopril Cough   Losartan Cough    Unknown bad side effects Unknown bad side effects    Metabolic Disorder Labs: Lab Results  Component Value Date   HGBA1C 6.2 (H) 10/21/2021   MPG 131.24 10/21/2021   MPG 131 09/28/2015   Lab Results  Component Value Date   PROLACTIN 3.7 11/15/2011   Lab Results  Component Value Date   CHOL 163 10/21/2021   TRIG 84 10/21/2021   HDL 58 10/21/2021   CHOLHDL 2.8 10/21/2021   VLDL 17 10/21/2021   LDLCALC 88 10/21/2021   LDLCALC 76 08/23/2014   Lab Results  Component Value Date   TSH 1.841 01/14/2014   TSH 1.304 11/15/2011    Therapeutic Level Labs: No results found for: "LITHIUM" No results found for: "VALPROATE" No results found for: "CBMZ"  Current Medications: Current Outpatient Medications  Medication Sig Dispense Refill   acetaminophen (TYLENOL) 500 MG tablet Take 1,000 mg by mouth every 6 (six) hours as needed for mild pain.     aspirin EC 81 MG tablet Take 1 tablet (81 mg total) by mouth daily. Swallow whole. 30 tablet    Blood Glucose Monitoring Suppl (GLUCOCOM BLOOD  GLUCOSE MONITOR) DEVI Please check blood sugar 5 times per day due to fluctuating blood sugars.     clopidogrel (PLAVIX) 75 MG tablet Take 1 tablet (75 mg total) by mouth daily. 90 tablet 3   docusate sodium (COLACE) 100 MG capsule Take 1 capsule (100 mg total) by mouth every 12 (twelve) hours. (Patient taking differently: Take 100 mg by mouth 2 (two) times daily as needed for moderate constipation.) 30 capsule 0   ezetimibe (ZETIA) 10 MG tablet Take 1 tablet (10 mg total) by mouth daily. 90 tablet 3   glucose blood test strip 2 times per day     HYDROcodone-acetaminophen (NORCO/VICODIN) 5-325 MG tablet Take 1 tablet by mouth as needed.     ibuprofen (ADVIL) 200 MG tablet Take 400 mg by mouth every 6 (six) hours as needed for headache or moderate pain. (Patient not taking: Reported on 12/09/2022)     Insulin Glargine (LANTUS SOLOSTAR) 100 UNIT/ML Solostar Pen Inject 15 Units into the skin 2 (two) times daily. (Patient taking differently: Inject 15 Units into the skin at bedtime.) 15 mL 3   ketorolac (ACULAR) 0.5 % ophthalmic solution Place 1 drop into the right eye as needed.     melatonin 5 MG TABS Take 5 mg by mouth at bedtime as needed (sleep). (Patient not taking: Reported on 12/09/2022)     metFORMIN (GLUCOPHAGE) 1000 MG tablet Take 1 tablet (1,000 mg total) by mouth daily. (Patient taking differently: Take 1,000 mg by mouth 2 (two) times daily with a meal.) 180 tablet 0   Multiple Vitamins-Minerals (CENTRUM SILVER 50+MEN) TABS Take 1 tablet by mouth daily. (Patient not taking: Reported on 12/09/2022)     rosuvastatin (CRESTOR) 40 MG tablet Take 40 mg by mouth daily.     sertraline (ZOLOFT) 50 MG tablet Take 3 tablets (150 mg total) by mouth daily. 90 tablet 2   Study - OCEANIC-STROKE - asundexian 50 mg or placebo tablet (PI-Sethi) Take 1 tablet (50 mg total) by mouth daily. For Investigational Use Only. Take at the same time each day (preferably in the morning). Tablet should be swallowed whole with  water; it CANNOT be crushed or broken. Please contact [facility] if you have any questions regarding this medication or study. 1 tablet 0   traZODone (DESYREL) 100 MG tablet Take 1 tablet (100 mg total) by mouth at bedtime as needed for sleep. 30 tablet 2   No current facility-administered medications for this visit.     Musculoskeletal: Strength & Muscle Tone: within normal limits Gait & Station: normal Patient leans: N/A  Psychiatric Specialty Exam: Review of Systems  Psychiatric/Behavioral:  Negative for dysphoric mood, hallucinations and sleep disturbance.     There were no vitals taken for this visit.There is no height or weight on file to calculate BMI.  General Appearance: Casual  Eye Contact:  Good  Speech:  Clear and Coherent  Volume:  Normal  Mood:  Euthymic  Affect:  Appropriate  Thought Process:  Coherent  Orientation:  Full (Time, Place, and Person)  Thought Content: Logical   Suicidal Thoughts:  No  Homicidal Thoughts:  No  Memory:  Immediate;   Good Recent;   Good  Judgement:  Good  Insight:  Good  Psychomotor Activity:  Normal  Concentration:  Concentration: Good  Recall:  Good  Fund of Knowledge: Good  Language: Good  Akathisia:  NA  Handed:    AIMS (if indicated): not done  Assets:  Communication Skills Desire for Improvement Housing Leisure Time Resilience Social Support  ADL's:  Intact  Cognition: WNL  Sleep:  Good   Screenings: Insurance account manager from 08/02/2022 in [facility] Counselor from 04/29/2022 in [facility] Counselor from 02/19/2022 in [facility] Counselor from 01/21/2022 in [facility]  PHQ-2 Total Score 3 3 6 5 3   PHQ-9 Total Score 12 11 15 25 15       Flowsheet Row Counselor from 01/21/2022 in [facility] ED to Hosp-Admission (Discharged) from 10/31/2021 in [facility] ED to Hosp-Admission (Discharged) from 10/20/2021 in [facility]  C-SSRS RISK CATEGORY Error: Q3, 4, or 5 should not be populated when Q2 is No No Risk No Risk        Assessment and Plan:  Patient endorses feeling that he is ultimately met his therapy goal of being less irritable and processing grief.  Patient endorses feeling that he has moved on to acceptance of both his mother's death and the passing of multiple family friends and members.  Discussed with patient that he will continue to lose individuals around him, patient endorsed that he has accepted this and feels that it is better for him to continue to live his life.  On this note patient endorses improved judgment and solidify's that he has reached acceptance in regards to his complicated grief diagnosis.  Patient endorses that he is much less irritable and able to control his emotions.  Patient reports he feels he only needs to see provider every 2 months to discuss mood and for medication management.  Patient and provider agree to document patient's therapy as having been completed and will only move forward with medication management at this time.  Patient endorses feeling euthymic with good energy on current regimen.  Patient endorses being less reactive and having good control over his emotions.  Will not make any medication adjustments at this time.  MDD in partial remission Insomnia-resolved Complicated grief-resolved - Continue  Zoloft 150 mg daily - Continue trazodone 100 mg nightly  Tobacco use d/o - Reassess at future visit. Patient has been focused on weight loss which he achieved for healthier lifestyle.  Follow up in 2 months for medication management   Collaboration of Care: Collaboration of Care:   Patient/Guardian was advised Release of Information must be obtained prior to any record release in order to  collaborate their care with an outside provider. Patient/Guardian was advised if they have not already done so to contact the registration department to sign all necessary forms in order for [place] to release information regarding their care.   Consent: Patient/Guardian gives verbal consent for treatment and assignment of benefits for services provided during this visit. Patient/Guardian expressed understanding and agreed to proceed.   PGY-4 Bobbye Morton, MD 03/07/2023, 9:55 AM

## 2023-03-07 NOTE — Addendum Note (Signed)
Addended by: Eliseo Gum B on: 03/07/2023 01:00 PM   Modules accepted: Level of Service

## 2023-03-31 ENCOUNTER — Ambulatory Visit (INDEPENDENT_AMBULATORY_CARE_PROVIDER_SITE_OTHER): Payer: Medicare HMO

## 2023-03-31 DIAGNOSIS — I639 Cerebral infarction, unspecified: Secondary | ICD-10-CM

## 2023-04-01 LAB — CUP PACEART REMOTE DEVICE CHECK
Date Time Interrogation Session: 20240920230253
Implantable Pulse Generator Implant Date: 20230417

## 2023-04-14 NOTE — Progress Notes (Signed)
Carelink Summary Report / Loop Recorder 

## 2023-04-25 DIAGNOSIS — H43812 Vitreous degeneration, left eye: Secondary | ICD-10-CM | POA: Diagnosis not present

## 2023-04-25 DIAGNOSIS — H35033 Hypertensive retinopathy, bilateral: Secondary | ICD-10-CM | POA: Diagnosis not present

## 2023-04-25 DIAGNOSIS — H20043 Secondary noninfectious iridocyclitis, bilateral: Secondary | ICD-10-CM | POA: Diagnosis not present

## 2023-04-25 DIAGNOSIS — E113513 Type 2 diabetes mellitus with proliferative diabetic retinopathy with macular edema, bilateral: Secondary | ICD-10-CM | POA: Diagnosis not present

## 2023-04-25 DIAGNOSIS — H34231 Retinal artery branch occlusion, right eye: Secondary | ICD-10-CM | POA: Diagnosis not present

## 2023-04-25 DIAGNOSIS — H43821 Vitreomacular adhesion, right eye: Secondary | ICD-10-CM | POA: Diagnosis not present

## 2023-04-29 ENCOUNTER — Other Ambulatory Visit (HOSPITAL_COMMUNITY): Payer: Self-pay | Admitting: Neurology

## 2023-04-29 MED ORDER — STUDY - OCEANIC-STROKE - ASUNDEXIAN 50 MG OR PLACEBO TABLET (PI-SETHI): 1.0000 | ORAL_TABLET | Freq: Every day | ORAL | 0 refills | Status: DC

## 2023-04-30 IMAGING — MR MR HEAD W/O CM
6 of 10 series · 29 of 48 positions shown · non-contrast
Comparison: 10/20/2021.

CLINICAL DATA: Headache and left hand numbness and tingling

EXAM:
MRI HEAD WITHOUT CONTRAST
TECHNIQUE: Multiplanar, multiecho pulse sequences of the brain and surrounding
structures were obtained without intravenous contrast.

[Series 2: DWI · axial · 3.0mm · 0.94mm/px · z∈[-109,+35]mm · 9 of 99 slices shown (1 of 2)]
[im 1/99]
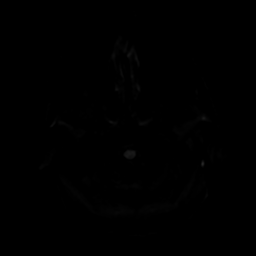
[im 13/99]
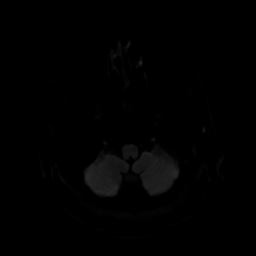
[im 25/99]
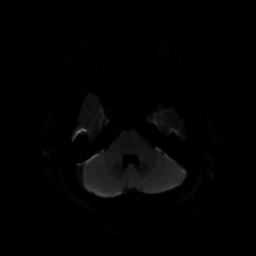
[im 37/99]
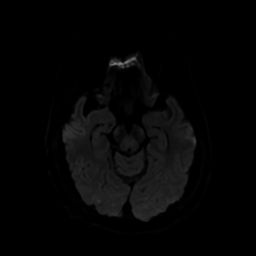
[im 50/99]
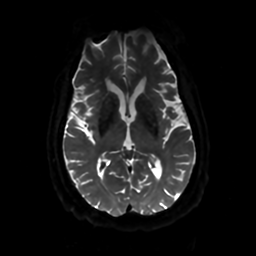
[im 62/99]
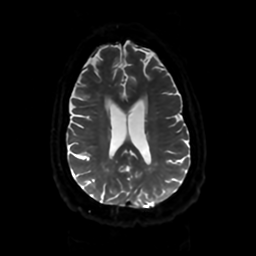
[im 74/99]
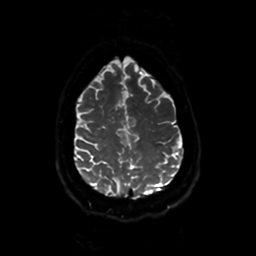
[im 86/99]
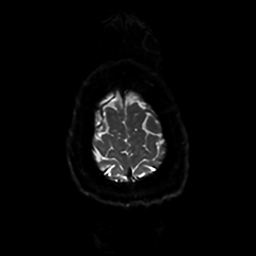
[im 99/99]
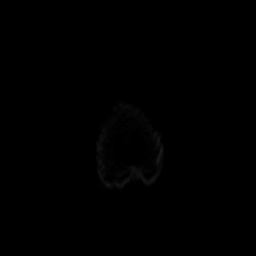

[Series 3: DWI · coronal · 4.0mm · 0.94mm/px · 7 of 74 slices shown (2 of 2)]
[im 1/74]
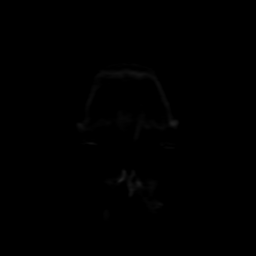
[im 13/74]
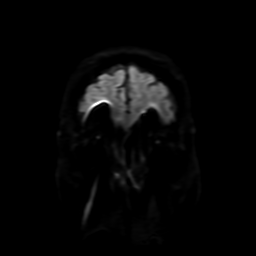
[im 25/74]
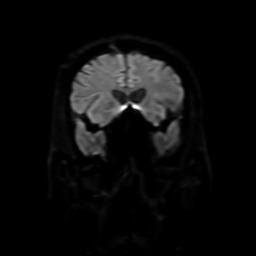
[im 37/74]
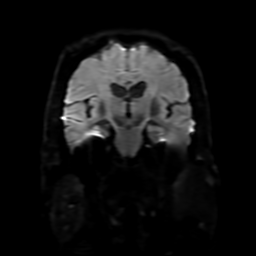
[im 49/74]
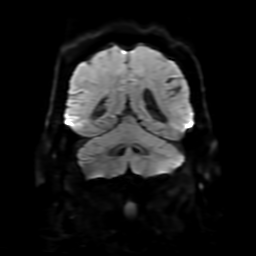
[im 61/74]
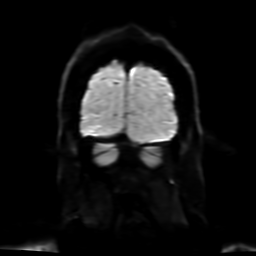
[im 74/74]
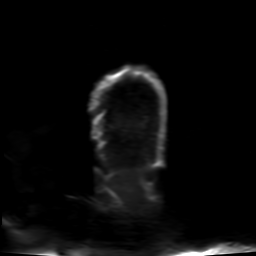

[Series 4: FLAIR · sagittal · 5.0mm · 0.23mm/px · 2 of 23 slices shown (1 of 2)]
[im 1/23]
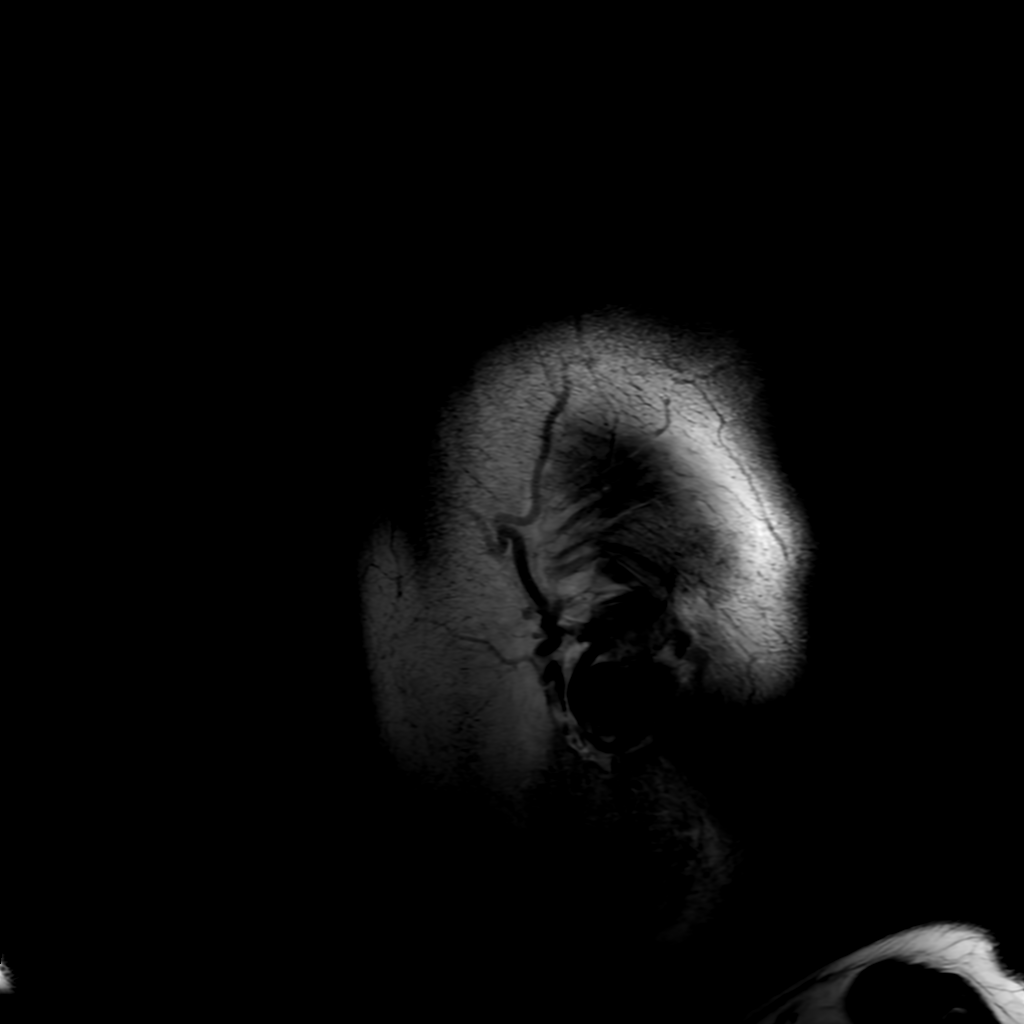
[im 23/23]
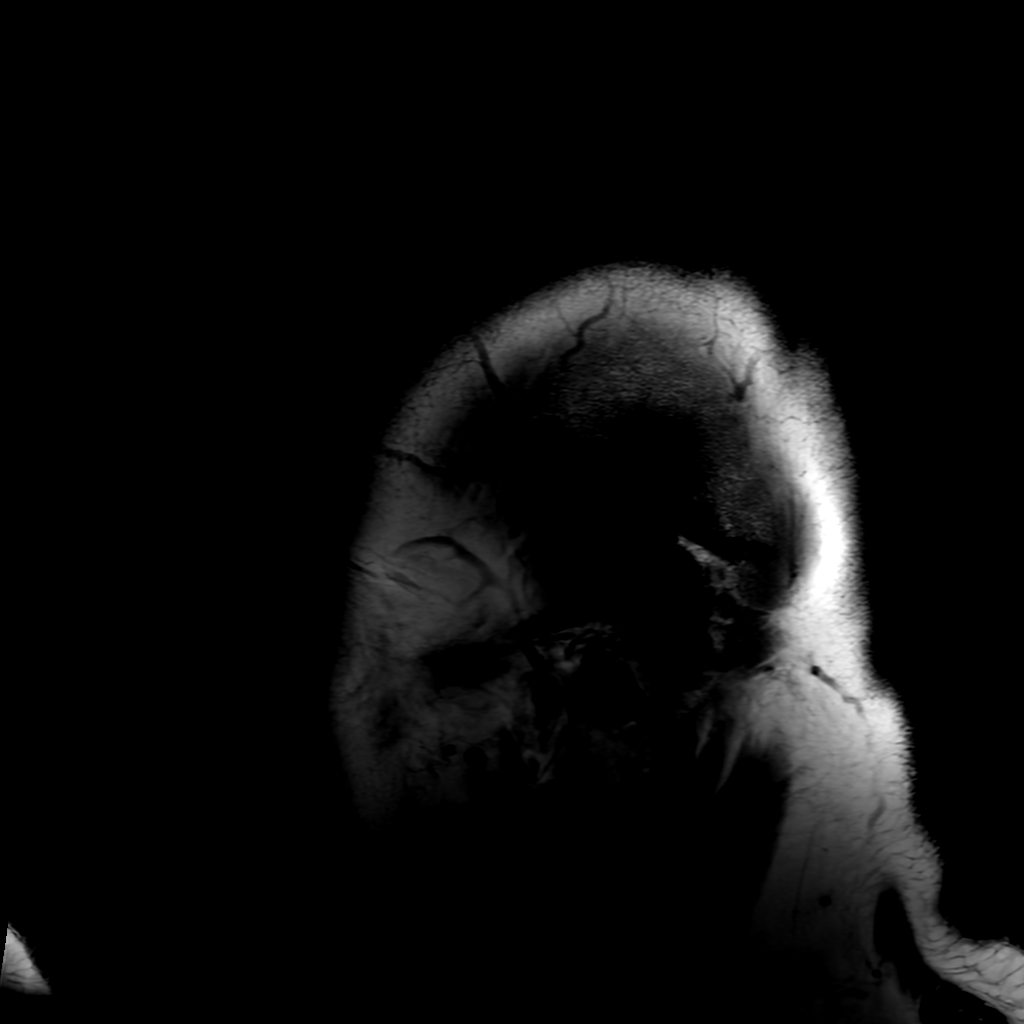

[Series 6: FLAIR · axial · 4.0mm · 0.45mm/px · z∈[-104,+44]mm · 3 of 35 slices shown (2 of 2)]
[im 1/35]
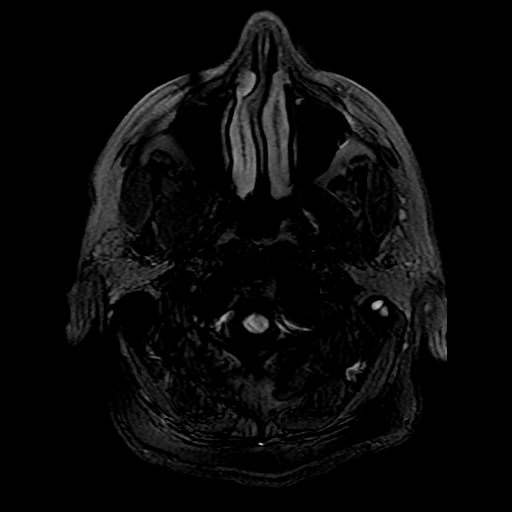
[im 18/35]
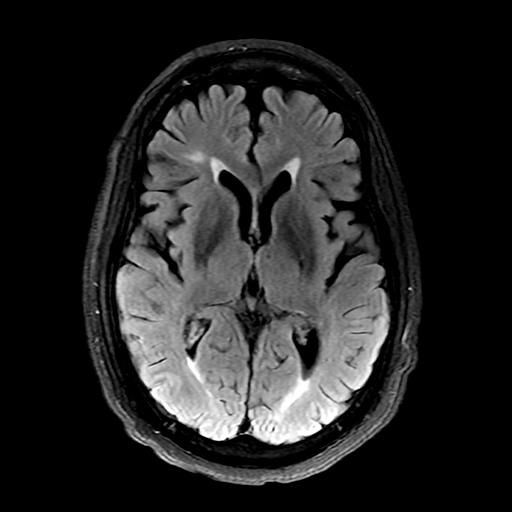
[im 35/35]
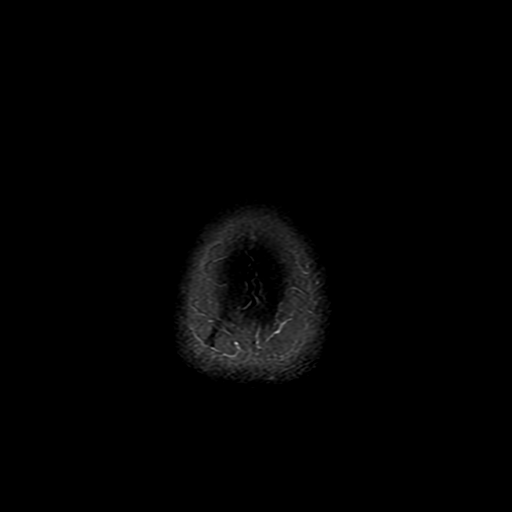

[Series 250: ADC · axial · 3.0mm · 0.94mm/px · z∈[-109,+35]mm · 5 of 50 slices shown (1 of 2)]
[im 1/50]
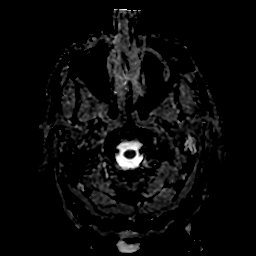
[im 13/50]
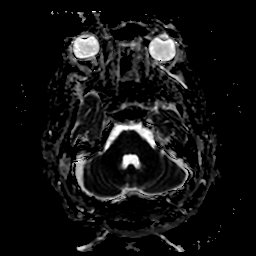
[im 25/50]
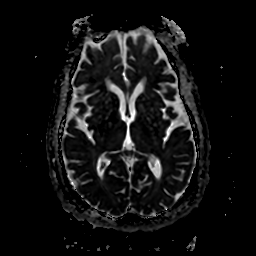
[im 37/50]
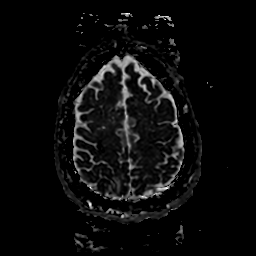
[im 50/50]
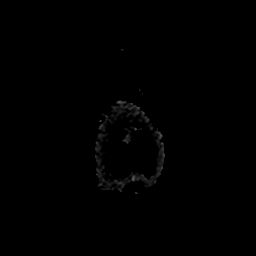

[Series 350: ADC · coronal · 4.0mm · 0.94mm/px · 3 of 36 slices shown (2 of 2)]
[im 1/36]
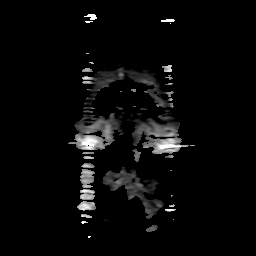
[im 18/36]
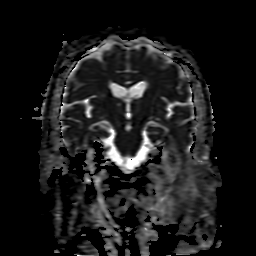
[im 36/36]
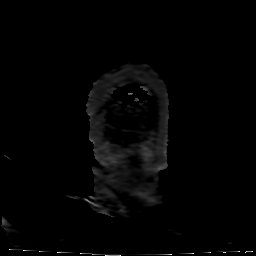

[29 of 48 positions shown; findings below may reference images not displayed]

FINDINGS: Brain: Scattered foci of restricted diffusion with ADC correlates,
most prominently in the right frontal lobe (series 2, images 25-42),
but also in the right temporal lobe (series 2, images 22-23),
occipital lobe (series 2, images 18-22), and right parietal lobe
(series 2, images 35-38). These are new from prior exam. No
restricted diffusion in the left cerebral hemisphere or cerebellum.
The previously noted acute infarct in the left parietal lobe has
normalized on ADC and demonstrates T2 shine through. This area is
associated with susceptibility, likely petechial hemorrhage.

No acute hemorrhage, mass, mass effect, or midline shift. Lacunar
infarcts in the right basal ganglia and corona radiata. T2
hyperintense signal in the periventricular white matter, likely the
sequela of chronic small vessel ischemic disease.

Vascular: Patent flow voids.

Skull and upper cervical spine: Normal marrow signal. Degenerative
changes are again noted in the cervical spine, with spinal canal
stenosis at C3-C4.

Sinuses/Orbits: No acute finding.

Other: Fluid in left-greater-than-right mastoid air cells
IMPRESSION: 1. Scattered foci of restricted diffusion throughout the right
cerebral hemisphere, which are new from the prior exam, most likely
acute infarcts. Given multiple vascular territories, an embolic
etiology is suspected.
2. Previously noted right parietal infarct from 10/20/2021 now
demonstrates hemosiderin deposition, most likely petechial
hemorrhage.

These results were called by telephone at the time of interpretation
on 11/01/2021 at [DATE] to provider DR BALIG , who verbally
acknowledged these results.

## 2023-05-05 ENCOUNTER — Ambulatory Visit (INDEPENDENT_AMBULATORY_CARE_PROVIDER_SITE_OTHER): Payer: Medicare HMO

## 2023-05-05 DIAGNOSIS — I639 Cerebral infarction, unspecified: Secondary | ICD-10-CM

## 2023-05-06 LAB — CUP PACEART REMOTE DEVICE CHECK
Date Time Interrogation Session: 20241027231346
Implantable Pulse Generator Implant Date: 20230417

## 2023-05-09 ENCOUNTER — Encounter (HOSPITAL_COMMUNITY): Payer: Medicare HMO | Admitting: Student in an Organized Health Care Education/Training Program

## 2023-05-09 ENCOUNTER — Encounter (HOSPITAL_COMMUNITY): Payer: Self-pay

## 2023-05-16 DIAGNOSIS — E113513 Type 2 diabetes mellitus with proliferative diabetic retinopathy with macular edema, bilateral: Secondary | ICD-10-CM | POA: Diagnosis not present

## 2023-05-23 ENCOUNTER — Encounter (HOSPITAL_COMMUNITY): Payer: Self-pay | Admitting: Student in an Organized Health Care Education/Training Program

## 2023-05-23 ENCOUNTER — Ambulatory Visit (INDEPENDENT_AMBULATORY_CARE_PROVIDER_SITE_OTHER): Payer: Medicare HMO | Admitting: Student in an Organized Health Care Education/Training Program

## 2023-05-23 VITALS — BP 121/72 | HR 96 | Wt 243.0 lb

## 2023-05-23 DIAGNOSIS — F331 Major depressive disorder, recurrent, moderate: Secondary | ICD-10-CM | POA: Diagnosis not present

## 2023-05-23 DIAGNOSIS — F330 Major depressive disorder, recurrent, mild: Secondary | ICD-10-CM

## 2023-05-23 MED ORDER — TRAZODONE HCL 100 MG PO TABS: 100.0000 mg | ORAL_TABLET | Freq: Every evening | ORAL | 3 refills | Status: DC | PRN

## 2023-05-23 MED ORDER — HYDROXYZINE PAMOATE 25 MG PO CAPS: 25.0000 mg | ORAL_CAPSULE | Freq: Three times a day (TID) | ORAL | 3 refills | Status: DC | PRN

## 2023-05-23 MED ORDER — SERTRALINE HCL 100 MG PO TABS
200.0000 mg | ORAL_TABLET | Freq: Every day | ORAL | 3 refills | Status: DC
Start: 2023-05-23 — End: 2023-08-15

## 2023-05-23 NOTE — Progress Notes (Signed)
Carelink Summary Report / Loop Recorder 

## 2023-05-23 NOTE — Progress Notes (Signed)
BH MD/PA/NP OP Progress Note  05/23/2023 12:12 PM Jerry [name]  MRN:  [id number]  Chief Complaint:  Chief Complaint  Patient presents with   Follow-up   HPI: Jerry [name] is a 67 year old patient with a past psychiatric history of reported PTSD, MDD, GAD who presents today for medication management.  Current medication regimen: Zoloft 150 mg daily Trazodone 100 mg nightly  Patient reports that his mood has been more difficult. Patient reports that he has been having some irritability and this relates to the 2 sisters saleing the home. He and the other sisters are getting a Clinical research associate. Patient reports that he and the sisters getting the lawyers are upset and also question what happened with the family house being sold and why things went the way they did. Patient reports that the 1 year anniversary of this mother's death is also during Thanksgiving week.   Patient endorses compliance with his medications but he has been having early AM awakenings around 4-5 AM. He averages 5-6hrs of sleep. Patient reports that he is no longer living with his daughter, he moved out last month. He is now living with his mother-in-law and wife. Patient reports that he moved out because his grandkids moved back in with his daughter. Patient reports that it can be a bit difficult living with his MIL, but overall things are going well and does feel well that the environment is more peaceful. In regards to his sleep he does not feel well rested. Patient reports that he has been taking about 3 hr naps after working out in the afternoon. He does endorse that the recent move and changes have been changes he is still adjusting to.   Patient reports that he is thinking of what to do the week of his mother's death, he thinks he will take some to himself but also spend some time with his grandkids as they are a point for joy for him.   Tobacco use- 3 cigs/ day. Patient reports that it helps calm him down. Patient reports that  he is feeling more irritable and on edge. Patient denies feeling constantly worried. Patient denies SI, HI, and AVH.   Patient denies etoh use.   Patient reports that he is still doing church and bible study. Patient denies anhedonia and is socializing.   Visit Diagnosis:    ICD-10-CM   1. MDD (major depressive disorder), recurrent episode, mild (HCC)  F33.0 hydrOXYzine (VISTARIL) 25 MG capsule    sertraline (ZOLOFT) 100 MG tablet    traZODone (DESYREL) 100 MG tablet    2. MDD (major depressive disorder), recurrent episode, moderate (HCC)  F33.1 sertraline (ZOLOFT) 100 MG tablet    traZODone (DESYREL) 100 MG tablet       Past Psychiatric History: Previously seen at [facility] for therapy and medication management; diagnosed with PTSD, MDD and GAD.  Completed therapy with Dr. Morrie Sheldon 01/24/2022 - 11/25/2022.  Continues medication management. - Previous medications include trazodone and Lexapro  02/2023-endorses feeling that he has moved on to acceptance of both his mother's death and the passing of multiple family friends and members. Continued Zoloft 150mg  aily and trazodone 100mg  qhs  No history of inpatient psychiatric care - No history of suicide attempts    Past Medical History:  Past Medical History:  Diagnosis Date   Arthritis    Diabetes mellitus 2008   type II   Dyslipidemia    Erectile dysfunction    History of cardiac catheterization 06/2013   Dr. Theron Arista  [place]   HTN (hypertension)    Hypogonadism male     Past Surgical History:  Procedure Laterality Date   APPENDECTOMY     BUBBLE STUDY  11/02/2021   Procedure: BUBBLE STUDY;  Surgeon: Maisie Fus, MD;  Location: [facility] ENDOSCOPY;  Service: Cardiovascular;;   COLONOSCOPY  08/2006   Dr. Russella Dar - repeat 2018   INCISION AND DRAINAGE PERIRECTAL ABSCESS     KNEE ARTHROSCOPY Left 12/28/2014   Procedure: ARTHROSCOPY LEFT KNEE WITH  MEDIAL MENSICUS DEBRIDEMENT;  Surgeon: Ollen Gross, MD;  Location: [facility] ORS;  Service:  Orthopedics;  Laterality: Left;   KNEE SURGERY     left knee - remote past   LEFT HEART CATHETERIZATION WITH CORONARY ANGIOGRAM N/A 06/11/2013   Procedure: LEFT HEART CATHETERIZATION WITH CORONARY ANGIOGRAM;  Surgeon: Peter M [place], MD;  Location: [facility] CATH LAB;  Service: Cardiovascular;  Laterality: N/A;   LOOP RECORDER INSERTION N/A 10/22/2021   Procedure: LOOP RECORDER INSERTION;  Surgeon: Duke Salvia, MD;  Location: [facility] INVASIVE CV LAB;  Service: Cardiovascular;  Laterality: N/A;   right arm and elbow surgery     TEE WITHOUT CARDIOVERSION N/A 11/02/2021   Procedure: TRANSESOPHAGEAL ECHOCARDIOGRAM (TEE);  Surgeon: Maisie Fus, MD;  Location: [facility] ENDOSCOPY;  Service: Cardiovascular;  Laterality: N/A;   TONSILLECTOMY     TOTAL KNEE ARTHROPLASTY Left 10/04/2015   Procedure: LEFT TOTAL KNEE ARTHROPLASTY right knee aspiration and injection;  Surgeon: Ollen Gross, MD;  Location: [facility] ORS;  Service: Orthopedics;  Laterality: Left;   TOTAL KNEE ARTHROPLASTY Right 11/04/2016   Procedure: RIGHT TOTAL KNEE ARTHROPLASTY;  Surgeon: Ollen Gross, MD;  Location: [facility] ORS;  Service: Orthopedics;  Laterality: Right;  with abductor block   wisdom teeth extractions      Family Psychiatric History: Granddaughter: Deceased, substance use disorder, bipolar, PTSD - History of depression in multiple sisters - Oldest sister: EtOH use disorder Mother: Dementia   Family History:  Family History  Problem Relation Age of Onset   Diabetes Mother    Other Father        death by MVA   Other Brother        died of MVA   Stroke Neg Hx    Heart disease Neg Hx    Cancer Neg Hx    Hypertension Neg Hx     Social History:  Social History   Socioeconomic History   Marital status: Married    Spouse name: Not on file   Number of children: Not on file   Years of education: Not on file   Highest education level: Not on file  Occupational History   Occupation: retired    Comment: prior physical therapist   Tobacco Use   Smoking status: Every Day    Current packs/day: 0.25    Types: Cigarettes   Smokeless tobacco: Never   Tobacco comments:    Started smoking again, 1 pack every 4 days  Substance and Sexual Activity   Alcohol use: No   Drug use: No   Sexual activity: Never  Other Topics Concern   Not on file  Social History Narrative   Married 22 years, 2 daughter and 1 son. Retired, used to work as a PT.  Exercises treadmill daily, some weight bearing exercise. Diet    Social Determinants of Health   Financial Resource Strain: Not on file  Food Insecurity: Not on file  Transportation Needs: Not on file  Physical Activity: Not on file  Stress: Not on  file  Social Connections: Not on file    Allergies:  Allergies  Allergen Reactions   Diclofenac Sodium Other (See Comments)    Gel caused a burning sensation    Lisinopril Cough   Losartan Cough    Unknown bad side effects Unknown bad side effects    Metabolic Disorder Labs: Lab Results  Component Value Date   HGBA1C 6.2 (H) 10/21/2021   MPG 131.24 10/21/2021   MPG 131 09/28/2015   Lab Results  Component Value Date   PROLACTIN 3.7 11/15/2011   Lab Results  Component Value Date   CHOL 163 10/21/2021   TRIG 84 10/21/2021   HDL 58 10/21/2021   CHOLHDL 2.8 10/21/2021   VLDL 17 10/21/2021   LDLCALC 88 10/21/2021   LDLCALC 76 08/23/2014   Lab Results  Component Value Date   TSH 1.841 01/14/2014   TSH 1.304 11/15/2011    Therapeutic Level Labs: No results found for: "LITHIUM" No results found for: "VALPROATE" No results found for: "CBMZ"  Current Medications: Current Outpatient Medications  Medication Sig Dispense Refill   hydrOXYzine (VISTARIL) 25 MG capsule Take 1 capsule (25 mg total) by mouth 3 (three) times daily as needed for anxiety. 90 capsule 3   acetaminophen (TYLENOL) 500 MG tablet Take 1,000 mg by mouth every 6 (six) hours as needed for mild pain.     aspirin EC 81 MG tablet Take 1 tablet (81  mg total) by mouth daily. Swallow whole. 30 tablet    Blood Glucose Monitoring Suppl (GLUCOCOM BLOOD GLUCOSE MONITOR) DEVI Please check blood sugar 5 times per day due to fluctuating blood sugars.     clopidogrel (PLAVIX) 75 MG tablet Take 1 tablet (75 mg total) by mouth daily. 90 tablet 3   docusate sodium (COLACE) 100 MG capsule Take 1 capsule (100 mg total) by mouth every 12 (twelve) hours. (Patient taking differently: Take 100 mg by mouth 2 (two) times daily as needed for moderate constipation.) 30 capsule 0   ezetimibe (ZETIA) 10 MG tablet Take 1 tablet (10 mg total) by mouth daily. 90 tablet 3   glucose blood test strip 2 times per day     HYDROcodone-acetaminophen (NORCO/VICODIN) 5-325 MG tablet Take 1 tablet by mouth as needed.     ibuprofen (ADVIL) 200 MG tablet Take 400 mg by mouth every 6 (six) hours as needed for headache or moderate pain. (Patient not taking: Reported on 12/09/2022)     Insulin Glargine (LANTUS SOLOSTAR) 100 UNIT/ML Solostar Pen Inject 15 Units into the skin 2 (two) times daily. (Patient taking differently: Inject 15 Units into the skin at bedtime.) 15 mL 3   ketorolac (ACULAR) 0.5 % ophthalmic solution Place 1 drop into the right eye as needed.     melatonin 5 MG TABS Take 5 mg by mouth at bedtime as needed (sleep). (Patient not taking: Reported on 12/09/2022)     metFORMIN (GLUCOPHAGE) 1000 MG tablet Take 1 tablet (1,000 mg total) by mouth daily. (Patient taking differently: Take 1,000 mg by mouth 2 (two) times daily with a meal.) 180 tablet 0   Multiple Vitamins-Minerals (CENTRUM SILVER 50+MEN) TABS Take 1 tablet by mouth daily. (Patient not taking: Reported on 12/09/2022)     rosuvastatin (CRESTOR) 40 MG tablet Take 40 mg by mouth daily.     sertraline (ZOLOFT) 100 MG tablet Take 2 tablets (200 mg total) by mouth daily. 60 tablet 3   Study - OCEANIC-STROKE - asundexian 50 mg or placebo tablet (PI-Sethi) Take  1 tablet (50 mg total) by mouth daily. For Investigational Use  Only. Take at the same time each day (preferably in the morning). Tablet should be swallowed whole with water; it CANNOT be crushed or broken. Please contact [facility] if you have any questions regarding this medication or study. 196 tablet 0   traZODone (DESYREL) 100 MG tablet Take 1 tablet (100 mg total) by mouth at bedtime as needed for sleep. 30 tablet 3   No current facility-administered medications for this visit.     Musculoskeletal: Strength & Muscle Tone: within normal limits Gait & Station: normal Patient leans: N/A  Psychiatric Specialty Exam: Review of Systems  Psychiatric/Behavioral:  Negative for dysphoric mood, hallucinations and sleep disturbance.     Blood pressure 121/72, pulse 96, weight 243 lb (110.2 kg), SpO2 99%.Body mass index is 29.58 kg/m.  General Appearance: Casual  Eye Contact:  Good  Speech:  Clear and Coherent  Volume:  Normal  Mood:  Euthymic  Affect:  Appropriate  Thought Process:  Coherent  Orientation:  Full (Time, Place, and Person)  Thought Content: Logical   Suicidal Thoughts:  No  Homicidal Thoughts:  No  Memory:  Immediate;   Good Recent;   Good  Judgement:  Good  Insight:  Good  Psychomotor Activity:  Normal  Concentration:  Concentration: Good  Recall:  Good  Fund of Knowledge: Good  Language: Good  Akathisia:  NA  Handed:    AIMS (if indicated): not done  Assets:  Communication Skills Desire for Improvement Housing Leisure Time Resilience Social Support  ADL's:  Intact  Cognition: WNL  Sleep:  Fair   Screenings: Insurance account manager from 08/02/2022 in [facility] Counselor from 04/29/2022 in [facility] Counselor from 02/19/2022 in [facility] Counselor from 01/21/2022 in [facility]  PHQ-2 Total Score 3 3 6 5 3    PHQ-9 Total Score 12 11 15 25 15       Flowsheet Row Counselor from 01/21/2022 in [facility] ED to Hosp-Admission (Discharged) from 10/31/2021 in [facility] ED to Hosp-Admission (Discharged) from 10/20/2021 in [facility]  C-SSRS RISK CATEGORY Error: Q3, 4, or 5 should not be populated when Q2 is No No Risk No Risk        Assessment and Plan: Patient endorsing worsening irritability which appears to be 2/2 to some lower mood but also anxiety. He remains active and eating ok, but is not sleeping as well. Will increase Zoloft to address his irritability which for patient is a main symptoms of depression and also anxiety. Will also start Hydroxyzine for more acute targeting of anxious symptoms, this may also help with decrease reliance on cigarettes.   MDD recurrent, mild w/ anxious distress Insomnia-acute exacerbation Complicated grief-resolved - Increase Zoloft to 200mg  daily - Continue trazodone 100 mg nightly - Start Hydroxyzine 25mg  TID PRN, for anxiety  Tobacco use d/o - Not quite ready to quit, but wants to quit in the future. Will also start Hydroxyzine to help decrease need and reward of "being more relaxed" 2/2 to feeling stressed  Follow up in 2 months for medication management   Collaboration of Care: Collaboration of Care: Dr. Lucianne Muss, attending, was present for part of assessment.  Patient/Guardian was advised Release of Information must be obtained prior to any record release  in order to collaborate their care with an outside provider. Patient/Guardian was advised if they have not already done so to contact the registration department to sign all necessary forms in order for [place] to release information regarding their care.   Consent: Patient/Guardian gives verbal consent for treatment and assignment of benefits for services provided during this visit. Patient/Guardian expressed understanding and agreed to  proceed.   PGY-4 Bobbye Morton, MD 05/23/2023, 12:12 PM

## 2023-05-23 NOTE — Patient Instructions (Signed)
We increased your Sertraline to 200mg  daily  We also started Hydroxyzine 25mg , you can take three times per day as needed for anxiety/ irritability. Try to give at least 4 hours between each dose.   3. Continue Trazodone 100mg  nightly.

## 2023-05-28 DIAGNOSIS — F32A Depression, unspecified: Secondary | ICD-10-CM | POA: Diagnosis not present

## 2023-05-28 DIAGNOSIS — R790 Abnormal level of blood mineral: Secondary | ICD-10-CM | POA: Diagnosis not present

## 2023-05-28 DIAGNOSIS — Z125 Encounter for screening for malignant neoplasm of prostate: Secondary | ICD-10-CM | POA: Diagnosis not present

## 2023-05-28 DIAGNOSIS — R32 Unspecified urinary incontinence: Secondary | ICD-10-CM | POA: Diagnosis not present

## 2023-05-28 DIAGNOSIS — E119 Type 2 diabetes mellitus without complications: Secondary | ICD-10-CM | POA: Diagnosis not present

## 2023-05-28 DIAGNOSIS — G8929 Other chronic pain: Secondary | ICD-10-CM | POA: Diagnosis not present

## 2023-05-28 DIAGNOSIS — Z1159 Encounter for screening for other viral diseases: Secondary | ICD-10-CM | POA: Diagnosis not present

## 2023-05-28 DIAGNOSIS — I7410 Embolism and thrombosis of unspecified parts of aorta: Secondary | ICD-10-CM | POA: Diagnosis not present

## 2023-05-28 DIAGNOSIS — I10 Essential (primary) hypertension: Secondary | ICD-10-CM | POA: Diagnosis not present

## 2023-05-28 DIAGNOSIS — Z0000 Encounter for general adult medical examination without abnormal findings: Secondary | ICD-10-CM | POA: Diagnosis not present

## 2023-05-28 DIAGNOSIS — E785 Hyperlipidemia, unspecified: Secondary | ICD-10-CM | POA: Diagnosis not present

## 2023-05-28 DIAGNOSIS — Z8673 Personal history of transient ischemic attack (TIA), and cerebral infarction without residual deficits: Secondary | ICD-10-CM | POA: Diagnosis not present

## 2023-06-04 DIAGNOSIS — E11620 Type 2 diabetes mellitus with diabetic dermatitis: Secondary | ICD-10-CM | POA: Diagnosis not present

## 2023-06-04 DIAGNOSIS — L309 Dermatitis, unspecified: Secondary | ICD-10-CM | POA: Diagnosis not present

## 2023-06-06 ENCOUNTER — Ambulatory Visit (INDEPENDENT_AMBULATORY_CARE_PROVIDER_SITE_OTHER): Payer: Medicare HMO

## 2023-06-06 DIAGNOSIS — I639 Cerebral infarction, unspecified: Secondary | ICD-10-CM | POA: Diagnosis not present

## 2023-06-08 LAB — CUP PACEART REMOTE DEVICE CHECK
Date Time Interrogation Session: 20241129230521
Implantable Pulse Generator Implant Date: 20230417

## 2023-06-16 DIAGNOSIS — E113213 Type 2 diabetes mellitus with mild nonproliferative diabetic retinopathy with macular edema, bilateral: Secondary | ICD-10-CM | POA: Diagnosis not present

## 2023-06-30 NOTE — Progress Notes (Signed)
Carelink Summary Report / Loop Recorder 

## 2023-06-30 NOTE — Addendum Note (Signed)
Addended by: Elease Etienne A on: 06/30/2023 09:16 AM   Modules accepted: Orders

## 2023-07-03 DIAGNOSIS — R972 Elevated prostate specific antigen [PSA]: Secondary | ICD-10-CM | POA: Diagnosis not present

## 2023-07-14 ENCOUNTER — Ambulatory Visit (INDEPENDENT_AMBULATORY_CARE_PROVIDER_SITE_OTHER): Payer: Medicare Other

## 2023-07-14 DIAGNOSIS — I639 Cerebral infarction, unspecified: Secondary | ICD-10-CM | POA: Diagnosis not present

## 2023-07-14 LAB — CUP PACEART REMOTE DEVICE CHECK
Date Time Interrogation Session: 20250103230506
Implantable Pulse Generator Implant Date: 20230417

## 2023-07-25 ENCOUNTER — Encounter (HOSPITAL_COMMUNITY): Payer: Self-pay | Admitting: Student in an Organized Health Care Education/Training Program

## 2023-08-06 ENCOUNTER — Encounter: Payer: Self-pay | Admitting: Internal Medicine

## 2023-08-15 ENCOUNTER — Ambulatory Visit (INDEPENDENT_AMBULATORY_CARE_PROVIDER_SITE_OTHER): Payer: Medicare Other | Admitting: Student in an Organized Health Care Education/Training Program

## 2023-08-15 VITALS — BP 110/76 | HR 102 | Wt 232.0 lb

## 2023-08-15 DIAGNOSIS — F4310 Post-traumatic stress disorder, unspecified: Secondary | ICD-10-CM

## 2023-08-15 DIAGNOSIS — F331 Major depressive disorder, recurrent, moderate: Secondary | ICD-10-CM

## 2023-08-15 MED ORDER — SERTRALINE HCL 100 MG PO TABS: 200.0000 mg | ORAL_TABLET | Freq: Every day | ORAL | 1 refills | Status: DC

## 2023-08-15 MED ORDER — HYDROXYZINE PAMOATE 25 MG PO CAPS: 25.0000 mg | ORAL_CAPSULE | Freq: Three times a day (TID) | ORAL | 1 refills | Status: DC | PRN

## 2023-08-15 MED ORDER — TRAZODONE HCL 100 MG PO TABS: 100.0000 mg | ORAL_TABLET | Freq: Every evening | ORAL | 1 refills | Status: DC | PRN

## 2023-08-15 NOTE — Progress Notes (Addendum)
 [facility] MD/PA/NP OP Progress Note  08/15/2023 1:43 PM Jerry [name]  MRN:  [id number]  Chief Complaint:  Chief Complaint  Patient presents with   Follow-up   HPI: Jerry [name] is a 68 year old patient with a past psychiatric history of reported PTSD, MDD, GAD who presents today for medication management.  Current medication regimen: Zoloft  200 mg daily Trazodone  100 mg nightly Hydroxyzine  25mg  TID PRN   Patinet reports that he is ok. Patient reports that a lot is going on at home but he is trying to stay calm. Patinet reports that the car troubles that prevented him from coming to the last appt caused some issues with his ability to get to his activities. He also has a family member that was really sick that caused some stress as he was trying to be supportive to others. Patient reports that he is also stressed about the process of acquiring housing and related issues. Patient reports that he is still staying his MIL and this is still going well. Patient reports that he is still having insomnia, he reports that has problems falling asleep. Patient does not feel like it is anxiety keeping him awake. Patient reports that it takes him about 2-3 hours to fall asleep even though he feels tired. Patient reports that he is averaging 8hrs of sleep but feels well rested. Patient reports that his appetite is stable.   Patient denies feeling hopeless, worthless and denies SI, HI, and AVH.  Patient denies nightmares. Patient denies hypervigilance or feeling overly anxious.   Patinet reports that he feels clam after taking the hydroxyizine.   Tobacco use- 3 cigs/ day.  Patient denies etoh use.     Visit Diagnosis:    ICD-10-CM   1. MDD (major depressive disorder), recurrent episode, moderate (HCC)  F33.1     2. PTSD (post-traumatic stress disorder)  F43.10         Past Psychiatric History: Previously seen at [facility] for therapy and medication management; diagnosed with PTSD, MDD and GAD.   Completed therapy with Dr. Juleen 01/24/2022 - 11/25/2022.  Continues medication management. - Previous medications include trazodone  and Lexapro  02/2023-endorses feeling that he has moved on to acceptance of both his mother's death and the passing of multiple family friends and members. Continued Zoloft  150mg  aily and trazodone  100mg  at bedtime 05/2023- Patient endorsed increased irritability, insomnia and dysphoria with triggers being ongoing family issues. Increased Zoloft  to 200mg  DAILY and started Hydroxyzine  25mg  TID PRN for anxiety symptoms.   No history of inpatient psychiatric care - No history of suicide attempts    Past Medical History:  Past Medical History:  Diagnosis Date   Arthritis    Diabetes mellitus 2008   type II   Dyslipidemia    Erectile dysfunction    History of cardiac catheterization 06/2013   Dr. Peter Jordan   HTN (hypertension)    Hypogonadism male     Past Surgical History:  Procedure Laterality Date   APPENDECTOMY     BUBBLE STUDY  11/02/2021   Procedure: BUBBLE STUDY;  Surgeon: Alvan Ronal BRAVO, MD;  Location: [facility] ENDOSCOPY;  Service: Cardiovascular;;   COLONOSCOPY  08/2006   Dr. Aneita - repeat 2018   INCISION AND DRAINAGE PERIRECTAL ABSCESS     KNEE ARTHROSCOPY Left 12/28/2014   Procedure: ARTHROSCOPY LEFT KNEE WITH  MEDIAL MENSICUS DEBRIDEMENT;  Surgeon: Dempsey Moan, MD;  Location: [facility] ORS;  Service: Orthopedics;  Laterality: Left;   KNEE SURGERY  left knee - remote past   LEFT HEART CATHETERIZATION WITH CORONARY ANGIOGRAM N/A 06/11/2013   Procedure: LEFT HEART CATHETERIZATION WITH CORONARY ANGIOGRAM;  Surgeon: Peter M Jordan, MD;  Location: [facility] CATH LAB;  Service: Cardiovascular;  Laterality: N/A;   LOOP RECORDER INSERTION N/A 10/22/2021   Procedure: LOOP RECORDER INSERTION;  Surgeon: Fernande Elspeth BROCKS, MD;  Location: [facility] INVASIVE CV LAB;  Service: Cardiovascular;  Laterality: N/A;   right arm and elbow surgery     TEE WITHOUT CARDIOVERSION N/A  11/02/2021   Procedure: TRANSESOPHAGEAL ECHOCARDIOGRAM (TEE);  Surgeon: Alvan Ronal BRAVO, MD;  Location: [facility] ENDOSCOPY;  Service: Cardiovascular;  Laterality: N/A;   TONSILLECTOMY     TOTAL KNEE ARTHROPLASTY Left 10/04/2015   Procedure: LEFT TOTAL KNEE ARTHROPLASTY right knee aspiration and injection;  Surgeon: Dempsey Moan, MD;  Location: [facility] ORS;  Service: Orthopedics;  Laterality: Left;   TOTAL KNEE ARTHROPLASTY Right 11/04/2016   Procedure: RIGHT TOTAL KNEE ARTHROPLASTY;  Surgeon: Dempsey Moan, MD;  Location: [facility] ORS;  Service: Orthopedics;  Laterality: Right;  with abductor block   wisdom teeth extractions      Family Psychiatric History: Granddaughter: Deceased, substance use disorder, bipolar, PTSD - History of depression in multiple sisters - Oldest sister: EtOH use disorder Mother: Dementia   Family History:  Family History  Problem Relation Age of Onset   Diabetes Mother    Other Father        death by MVA   Other Brother        died of MVA   Stroke Neg Hx    Heart disease Neg Hx    Cancer Neg Hx    Hypertension Neg Hx     Social History:  Social History   Socioeconomic History   Marital status: Married    Spouse name: Not on file   Number of children: Not on file   Years of education: Not on file   Highest education level: Not on file  Occupational History   Occupation: retired    Comment: prior physical therapist  Tobacco Use   Smoking status: Every Day    Current packs/day: 0.25    Types: Cigarettes   Smokeless tobacco: Never   Tobacco comments:    Started smoking again, 1 pack every 4 days  Substance and Sexual Activity   Alcohol use: No   Drug use: No   Sexual activity: Never  Other Topics Concern   Not on file  Social History Narrative   Married 22 years, 2 daughter and 1 son. Retired, used to work as a PT.  Exercises treadmill daily, some weight bearing exercise. Diet    Social Drivers of Corporate Investment Banker Strain: Not on file  Food  Insecurity: High Risk (05/28/2023)   Received from [facility]   Hunger Vital Sign    Worried About Running Out of Food in the Last Year: Never true    Ran Out of Food in the Last Year: Often true  Transportation Needs: No Transportation Needs (05/28/2023)   Received from [facility]    In the past 12 months, has lack of reliable transportation kept you from medical appointments, meetings, work or from getting things needed for daily living? : No  Physical Activity: Not on file  Stress: Not on file  Social Connections: Not on file    Allergies:  Allergies  Allergen Reactions   Diclofenac  Sodium Other (See Comments)    Gel caused a burning sensation  Lisinopril  Cough   Losartan  Cough    Unknown bad side effects Unknown bad side effects    Metabolic Disorder Labs: Lab Results  Component Value Date   HGBA1C 6.2 (H) 10/21/2021   MPG 131.24 10/21/2021   MPG 131 09/28/2015   Lab Results  Component Value Date   PROLACTIN 3.7 11/15/2011   Lab Results  Component Value Date   CHOL 163 10/21/2021   TRIG 84 10/21/2021   HDL 58 10/21/2021   CHOLHDL 2.8 10/21/2021   VLDL 17 10/21/2021   LDLCALC 88 10/21/2021   LDLCALC 76 08/23/2014   Lab Results  Component Value Date   TSH 1.841 01/14/2014   TSH 1.304 11/15/2011    Therapeutic Level Labs: No results found for: LITHIUM No results found for: VALPROATE No results found for: CBMZ  Current Medications: Current Outpatient Medications  Medication Sig Dispense Refill   acetaminophen  (TYLENOL ) 500 MG tablet Take 1,000 mg by mouth every 6 (six) hours as needed for mild pain.     aspirin  EC 81 MG tablet Take 1 tablet (81 mg total) by mouth daily. Swallow whole. 30 tablet    Blood Glucose Monitoring Suppl (GLUCOCOM BLOOD GLUCOSE MONITOR) DEVI Please check blood sugar 5 times per day due to fluctuating blood sugars.     clopidogrel  (PLAVIX ) 75 MG tablet Take 1 tablet (75 mg total) by mouth daily. 90  tablet 3   docusate sodium  (COLACE) 100 MG capsule Take 1 capsule (100 mg total) by mouth every 12 (twelve) hours. (Patient taking differently: Take 100 mg by mouth 2 (two) times daily as needed for moderate constipation.) 30 capsule 0   ezetimibe  (ZETIA ) 10 MG tablet Take 1 tablet (10 mg total) by mouth daily. 90 tablet 3   glucose blood test strip 2 times per day     HYDROcodone -acetaminophen  (NORCO/VICODIN) 5-325 MG tablet Take 1 tablet by mouth as needed.     hydrOXYzine  (VISTARIL ) 25 MG capsule Take 1 capsule (25 mg total) by mouth 3 (three) times daily as needed for anxiety. 90 capsule 3   ibuprofen (ADVIL) 200 MG tablet Take 400 mg by mouth every 6 (six) hours as needed for headache or moderate pain. (Patient not taking: Reported on 12/09/2022)     Insulin  Glargine (LANTUS  SOLOSTAR) 100 UNIT/ML Solostar Pen Inject 15 Units into the skin 2 (two) times daily. (Patient taking differently: Inject 15 Units into the skin at bedtime.) 15 mL 3   ketorolac  (ACULAR ) 0.5 % ophthalmic solution Place 1 drop into the right eye as needed.     melatonin 5 MG TABS Take 5 mg by mouth at bedtime as needed (sleep). (Patient not taking: Reported on 12/09/2022)     metFORMIN  (GLUCOPHAGE ) 1000 MG tablet Take 1 tablet (1,000 mg total) by mouth daily. (Patient taking differently: Take 1,000 mg by mouth 2 (two) times daily with a meal.) 180 tablet 0   Multiple Vitamins-Minerals (CENTRUM SILVER 50+MEN) TABS Take 1 tablet by mouth daily. (Patient not taking: Reported on 12/09/2022)     rosuvastatin  (CRESTOR ) 40 MG tablet Take 40 mg by mouth daily.     sertraline  (ZOLOFT ) 100 MG tablet Take 2 tablets (200 mg total) by mouth daily. 60 tablet 3   Study - OCEANIC-STROKE - asundexian 50 mg or placebo tablet (PI-Sethi) Take 1 tablet (50 mg total) by mouth daily. For Investigational Use Only. Take at the same time each day (preferably in the morning). Tablet should be swallowed whole with water; it CANNOT be crushed  or broken. Please  contact [facility] if you have any questions regarding this medication or study. 196 tablet 0   traZODone  (DESYREL ) 100 MG tablet Take 1 tablet (100 mg total) by mouth at bedtime as needed for sleep. 30 tablet 3   No current facility-administered medications for this visit.     Musculoskeletal: Strength & Muscle Tone: within normal limits Gait & Station: normal Patient leans: N/A  Psychiatric Specialty Exam: Review of Systems  Psychiatric/Behavioral:  Positive for sleep disturbance. Negative for dysphoric mood and hallucinations.     Blood pressure 110/76, pulse (!) 102, weight 232 lb (105.2 kg), SpO2 98%.Body mass index is 28.24 kg/m.  General Appearance: Casual  Eye Contact:  Good  Speech:  Clear and Coherent  Volume:  Normal  Mood:  Euthymic  Affect:  Appropriate  Thought Process:  Coherent  Orientation:  Full (Time, Place, and Person)  Thought Content: Logical   Suicidal Thoughts:  No  Homicidal Thoughts:  No  Memory:  Immediate;   Good Recent;   Good  Judgement:  Good  Insight:  Good  Psychomotor Activity:  Normal  Concentration:  Concentration: Good  Recall:  Good  Fund of Knowledge: Good  Language: Good  Akathisia:  NA  Handed:    AIMS (if indicated): not done  Assets:  Communication Skills Desire for Improvement Housing Leisure Time Resilience Social Support  ADL's:  Intact  Cognition: WNL  Sleep:  Fair   Screenings: Insurance Account Manager from 08/02/2022 in [facility] Counselor from 04/29/2022 in [facility] Counselor from 02/19/2022 in [facility] Counselor from 01/21/2022 in [facility]  PHQ-2 Total Score 3 3 6 5 3   PHQ-9 Total Score 12 11 15 25 15       Flowsheet Row Counselor from 01/21/2022 in [facility] ED to  Hosp-Admission (Discharged) from 10/31/2021 in [facility] to Hosp-Admission (Discharged) from 10/20/2021 in [facility]  C-SSRS RISK CATEGORY Error: Q3, 4, or 5 should not be populated when Q2 is No No Risk No Risk        Assessment and Plan: Patient no longer endorsing significant irritability however he is having some issues sleeping.  While patient endorsed good sleep and fair energy during the day it is taking longer to fall asleep.  It does appear that patient continues to be impacted with some rumination regarding stressful events in his family.  Discussed with patient that he may benefit from restarting therapy to help process stressors as patient has significant benefit last time.  Unfortunately due to this provider graduating from residency soon we will have to refer patient to a new therapist but will continue to manage medications for the next few months.  Patient was in agreement with this plan.  MDD recurrent, mild w/ anxious distress Insomnia-acute exacerbation Complicated grief-resolved PTSD - Continue Zoloft  to 200mg  daily - Continue trazodone  100 mg nightly -Continue hydroxyzine  25mg  TID PRN, for anxiety -Therapy appointment via referral 10/01/2023  Tobacco use d/o - Not quite ready to quit, but wants to quit in the future. Will also start Hydroxyzine  to help decrease need and reward of being more relaxed 2/2 to feeling stressed  Follow up in 1 months for medication management   Collaboration of Care: Collaboration of Care: Dr. Zouev, attending, was present for  part of assessment.  Patient/Guardian was advised Release of Information must be obtained prior to any record release in order to collaborate their care with an outside provider. Patient/Guardian was advised if they have not already done so to contact the registration department to sign all necessary forms in order for us  to release information regarding their care.    Consent: Patient/Guardian gives verbal consent for treatment and assignment of benefits for services provided during this visit. Patient/Guardian expressed understanding and agreed to proceed.   Jerry Reggie KATHEE Rice, MD 08/15/2023, 1:43 PM

## 2023-08-18 ENCOUNTER — Ambulatory Visit (INDEPENDENT_AMBULATORY_CARE_PROVIDER_SITE_OTHER): Payer: Medicare HMO

## 2023-08-18 DIAGNOSIS — I639 Cerebral infarction, unspecified: Secondary | ICD-10-CM

## 2023-08-18 LAB — CUP PACEART REMOTE DEVICE CHECK
Date Time Interrogation Session: 20250207230659
Implantable Pulse Generator Implant Date: 20230417

## 2023-08-22 NOTE — Progress Notes (Signed)
[facility] Summary Report / Loop Recorder

## 2023-09-08 ENCOUNTER — Encounter: Payer: Self-pay | Admitting: Internal Medicine

## 2023-09-22 ENCOUNTER — Ambulatory Visit (INDEPENDENT_AMBULATORY_CARE_PROVIDER_SITE_OTHER): Payer: Medicare HMO

## 2023-09-22 DIAGNOSIS — I639 Cerebral infarction, unspecified: Secondary | ICD-10-CM

## 2023-09-23 LAB — CUP PACEART REMOTE DEVICE CHECK
Date Time Interrogation Session: 20250316231113
Implantable Pulse Generator Implant Date: 20230417

## 2023-09-24 NOTE — Progress Notes (Signed)
 Carelink Summary Report / Loop Recorder

## 2023-09-26 ENCOUNTER — Encounter (HOSPITAL_COMMUNITY): Payer: Medicare Other | Admitting: Student in an Organized Health Care Education/Training Program

## 2023-10-01 ENCOUNTER — Ambulatory Visit (HOSPITAL_COMMUNITY): Payer: Medicare Other | Admitting: Licensed Clinical Social Worker

## 2023-10-18 ENCOUNTER — Encounter: Payer: Self-pay | Admitting: Internal Medicine

## 2023-10-23 LAB — CUP PACEART REMOTE DEVICE CHECK
Date Time Interrogation Session: 20250416230845
Implantable Pulse Generator Implant Date: 20230417

## 2023-10-27 ENCOUNTER — Other Ambulatory Visit (HOSPITAL_COMMUNITY): Payer: Self-pay | Admitting: Pharmacist

## 2023-10-27 ENCOUNTER — Ambulatory Visit (INDEPENDENT_AMBULATORY_CARE_PROVIDER_SITE_OTHER): Payer: Medicare HMO

## 2023-10-27 DIAGNOSIS — I639 Cerebral infarction, unspecified: Secondary | ICD-10-CM

## 2023-10-27 MED ORDER — STUDY - OCEANIC-STROKE - ASUNDEXIAN 50 MG OR PLACEBO TABLET (PI-SETHI): 1.0000 | ORAL_TABLET | Freq: Every day | ORAL | 0 refills | Status: AC

## 2023-11-03 ENCOUNTER — Encounter: Payer: Self-pay | Admitting: Cardiology

## 2023-11-07 NOTE — Progress Notes (Signed)
 Carelink Summary Report / Loop Recorder

## 2023-11-07 NOTE — Addendum Note (Signed)
 Addended by: Edra Govern D on: 11/07/2023 04:06 PM   Modules accepted: Orders

## 2023-11-18 ENCOUNTER — Ambulatory Visit (HOSPITAL_COMMUNITY): Admitting: Licensed Clinical Social Worker

## 2023-12-02 ENCOUNTER — Telehealth (HOSPITAL_COMMUNITY): Payer: Self-pay

## 2023-12-02 ENCOUNTER — Ambulatory Visit (INDEPENDENT_AMBULATORY_CARE_PROVIDER_SITE_OTHER): Payer: Medicare HMO

## 2023-12-02 DIAGNOSIS — I639 Cerebral infarction, unspecified: Secondary | ICD-10-CM | POA: Diagnosis not present

## 2023-12-02 LAB — CUP PACEART REMOTE DEVICE CHECK
Date Time Interrogation Session: 20250526231851
Implantable Pulse Generator Implant Date: 20230417

## 2023-12-02 NOTE — Telephone Encounter (Signed)
 Pt is requesting a refill of SERTRALINE  to be sent to on file pharmacy.   Pt canceled on 3/21 with no rescheduled apps, not sure if you want to send this but just documenting.

## 2023-12-03 ENCOUNTER — Other Ambulatory Visit: Payer: Self-pay | Admitting: Student in an Organized Health Care Education/Training Program

## 2023-12-03 ENCOUNTER — Ambulatory Visit: Payer: Self-pay | Admitting: Cardiology

## 2023-12-03 DIAGNOSIS — F331 Major depressive disorder, recurrent, moderate: Secondary | ICD-10-CM

## 2023-12-03 MED ORDER — SERTRALINE HCL 100 MG PO TABS: 200.0000 mg | ORAL_TABLET | Freq: Every day | ORAL | 1 refills | Status: DC

## 2023-12-03 NOTE — Telephone Encounter (Addendum)
 Will see patient this week and assess for refill then.   Meenakshi Sazama, MD

## 2023-12-15 NOTE — Progress Notes (Signed)
 Carelink Summary Report / Loop Recorder

## 2024-01-01 ENCOUNTER — Ambulatory Visit (INDEPENDENT_AMBULATORY_CARE_PROVIDER_SITE_OTHER): Payer: Self-pay

## 2024-01-01 DIAGNOSIS — I639 Cerebral infarction, unspecified: Secondary | ICD-10-CM | POA: Diagnosis not present

## 2024-01-01 LAB — CUP PACEART REMOTE DEVICE CHECK
Date Time Interrogation Session: 20250625231522
Implantable Pulse Generator Implant Date: 20230417

## 2024-01-04 ENCOUNTER — Ambulatory Visit: Payer: Self-pay | Admitting: Cardiology

## 2024-01-06 ENCOUNTER — Ambulatory Visit (HOSPITAL_COMMUNITY): Admitting: Licensed Clinical Social Worker

## 2024-01-21 NOTE — Progress Notes (Signed)
 Carelink Summary Report / Loop Recorder

## 2024-02-02 ENCOUNTER — Ambulatory Visit (INDEPENDENT_AMBULATORY_CARE_PROVIDER_SITE_OTHER): Payer: Self-pay

## 2024-02-02 DIAGNOSIS — I639 Cerebral infarction, unspecified: Secondary | ICD-10-CM | POA: Diagnosis not present

## 2024-02-02 LAB — CUP PACEART REMOTE DEVICE CHECK
Date Time Interrogation Session: 20250727232119
Implantable Pulse Generator Implant Date: 20230417

## 2024-02-08 ENCOUNTER — Ambulatory Visit: Payer: Self-pay | Admitting: Cardiology

## 2024-02-10 NOTE — Progress Notes (Deleted)
 [facility] MD/PA/NP OP Progress Note  02/10/2024 8:37 AM [name]  MRN:  [id number]   Assessment and Plan: Patient no longer endorsing significant irritability however he is having some issues sleeping.  While patient endorsed good sleep and fair energy during the day it is taking longer to fall asleep.  It does appear that patient continues to be impacted with some rumination regarding stressful events in his family.    ***  # MDD recurrent, mild w/ anxious distress # Insomnia-acute exacerbation # PTSD - Continue Zoloft  to 200mg  daily - Continue trazodone  100 mg nightly - Continue hydroxyzine  25mg  TID PRN, for anxiety - Therapy appointment via referral 10/01/2023  # Tobacco use d/o - Not quite ready to quit, but wants to quit in the future  # Hx of Complicated grief-resolved  Identifying information: Jerry [name] is a 68 year old patient with a past psychiatric history of reported PTSD, MDD, GAD who presents today for medication management.  Chief Complaint:  No chief complaint on file.   Subjective   Patient seen ***.  Patient reports feeling *** today. Since the previous visit, ***. Regarding medications, patient notes ***. Patient reports the following adverse effects: ***.   Patient reports *** sleep, ***. Patient reports *** appetite, ***.   Patient rates anxiety a ***/10, depression a ***/10, and anger a ***/10.   Patient denies current SI, HI, and AVH. ***  Stressors include ***.   Substance use: ***  Forensic psychologist Tobacco     Visit Diagnosis:  No diagnosis found.   Past Psychiatric History: Previously seen at [facility] for therapy and medication management; diagnosed with PTSD, MDD and GAD.  Completed therapy with Dr. Juleen 01/24/2022 - 11/25/2022.   Previous medications: trazodone  and Lexapro  02/2023-endorses feeling that he has moved on to acceptance of both his mother's death and the passing of multiple family friends and members. Continued Zoloft   150mg  aily and trazodone  100mg  at bedtime 05/2023- Patient endorsed increased irritability, insomnia and dysphoria with triggers being ongoing family issues. Increased Zoloft  to 200mg  DAILY and started Hydroxyzine  25mg  TID PRN for anxiety symptoms.   No history of inpatient psychiatric care - No history of suicide attempts   Past Medical History:  Past Medical History:  Diagnosis Date   Arthritis    Diabetes mellitus 2008   type II   Dyslipidemia    Erectile dysfunction    History of cardiac catheterization 06/2013   Dr. Peter Swaziland   HTN (hypertension)    Hypogonadism male     Past Surgical History:  Procedure Laterality Date   APPENDECTOMY     BUBBLE STUDY  11/02/2021   Procedure: BUBBLE STUDY;  Surgeon: Alvan Ronal BRAVO, MD;  Location: [facility] ENDOSCOPY;  Service: Cardiovascular;;   COLONOSCOPY  08/2006   Dr. Aneita - repeat 2018   INCISION AND DRAINAGE PERIRECTAL ABSCESS     KNEE ARTHROSCOPY Left 12/28/2014   Procedure: ARTHROSCOPY LEFT KNEE WITH  MEDIAL MENSICUS DEBRIDEMENT;  Surgeon: Dempsey Moan, MD;  Location: [facility] ORS;  Service: Orthopedics;  Laterality: Left;   KNEE SURGERY     left knee - remote past   LEFT HEART CATHETERIZATION WITH CORONARY ANGIOGRAM N/A 06/11/2013   Procedure: LEFT HEART CATHETERIZATION WITH CORONARY ANGIOGRAM;  Surgeon: Peter M Swaziland, MD;  Location: [facility] CATH LAB;  Service: Cardiovascular;  Laterality: N/A;   LOOP RECORDER INSERTION N/A 10/22/2021   Procedure: LOOP RECORDER INSERTION;  Surgeon: Fernande Elspeth BROCKS, MD;  Location: [facility] INVASIVE CV LAB;  Service:  Cardiovascular;  Laterality: N/A;   right arm and elbow surgery     TEE WITHOUT CARDIOVERSION N/A 11/02/2021   Procedure: TRANSESOPHAGEAL ECHOCARDIOGRAM (TEE);  Surgeon: Alvan Ronal BRAVO, MD;  Location: [facility] ENDOSCOPY;  Service: Cardiovascular;  Laterality: N/A;   TONSILLECTOMY     TOTAL KNEE ARTHROPLASTY Left 10/04/2015   Procedure: LEFT TOTAL KNEE ARTHROPLASTY right knee aspiration and injection;  Surgeon:  Dempsey Moan, MD;  Location: [facility] ORS;  Service: Orthopedics;  Laterality: Left;   TOTAL KNEE ARTHROPLASTY Right 11/04/2016   Procedure: RIGHT TOTAL KNEE ARTHROPLASTY;  Surgeon: Dempsey Moan, MD;  Location: [facility] ORS;  Service: Orthopedics;  Laterality: Right;  with abductor block   wisdom teeth extractions      Family Psychiatric History: Granddaughter: Deceased, substance use disorder, bipolar, PTSD - History of depression in multiple sisters - Oldest sister: EtOH use disorder Mother: Dementia   Family History:  Family History  Problem Relation Age of Onset   Diabetes Mother    Other Father        death by MVA   Other Brother        died of MVA   Stroke Neg Hx    Heart disease Neg Hx    Cancer Neg Hx    Hypertension Neg Hx     Social History:  Social History   Socioeconomic History   Marital status: Married    Spouse name: Not on file   Number of children: Not on file   Years of education: Not on file   Highest education level: Not on file  Occupational History   Occupation: retired    Comment: prior physical therapist  Tobacco Use   Smoking status: Every Day    Current packs/day: 0.25    Types: Cigarettes   Smokeless tobacco: Never   Tobacco comments:    Started smoking again, 1 pack every 4 days  Substance and Sexual Activity   Alcohol use: No   Drug use: No   Sexual activity: Never  Other Topics Concern   Not on file  Social History Narrative   Married 22 years, 2 daughter and 1 son. Retired, used to work as a PT.  Exercises treadmill daily, some weight bearing exercise. Diet    Social Drivers of Corporate investment banker Strain: Not on file  Food Insecurity: High Risk (05/28/2023)   Received from [facility]   Hunger Vital Sign    Within the past 12 months, you worried that your food would run out before you got money to buy more: Never true    Within the past 12 months, the food you bought just didn't last and you didn't have money to get more. :  Often true  Transportation Needs: No Transportation Needs (05/28/2023)   Received from [facility]    In the past 12 months, has lack of reliable transportation kept you from medical appointments, meetings, work or from getting things needed for daily living? : No  Physical Activity: Not on file  Stress: Not on file  Social Connections: Not on file    Allergies:  Allergies  Allergen Reactions   Diclofenac  Sodium Other (See Comments)    Gel caused a burning sensation    Lisinopril  Cough   Losartan  Cough    Unknown bad side effects Unknown bad side effects    Metabolic Disorder Labs: Lab Results  Component Value Date   HGBA1C 6.2 (H) 10/21/2021   MPG 131.24 10/21/2021   MPG  131 09/28/2015   Lab Results  Component Value Date   PROLACTIN 3.7 11/15/2011   Lab Results  Component Value Date   CHOL 163 10/21/2021   TRIG 84 10/21/2021   HDL 58 10/21/2021   CHOLHDL 2.8 10/21/2021   VLDL 17 10/21/2021   LDLCALC 88 10/21/2021   LDLCALC 76 08/23/2014   Lab Results  Component Value Date   TSH 1.841 01/14/2014   TSH 1.304 11/15/2011    Therapeutic Level Labs: No results found for: LITHIUM No results found for: VALPROATE No results found for: CBMZ  Current Medications: Current Outpatient Medications  Medication Sig Dispense Refill   acetaminophen  (TYLENOL ) 500 MG tablet Take 1,000 mg by mouth every 6 (six) hours as needed for mild pain.     aspirin  EC 81 MG tablet Take 1 tablet (81 mg total) by mouth daily. Swallow whole. 30 tablet    Blood Glucose Monitoring Suppl (GLUCOCOM BLOOD GLUCOSE MONITOR) DEVI Please check blood sugar 5 times per day due to fluctuating blood sugars.     clopidogrel  (PLAVIX ) 75 MG tablet Take 1 tablet (75 mg total) by mouth daily. 90 tablet 3   docusate sodium  (COLACE) 100 MG capsule Take 1 capsule (100 mg total) by mouth every 12 (twelve) hours. (Patient taking differently: Take 100 mg by mouth 2 (two) times daily as  needed for moderate constipation.) 30 capsule 0   ezetimibe  (ZETIA ) 10 MG tablet Take 1 tablet (10 mg total) by mouth daily. 90 tablet 3   glucose blood test strip 2 times per day     HYDROcodone -acetaminophen  (NORCO/VICODIN) 5-325 MG tablet Take 1 tablet by mouth as needed.     hydrOXYzine  (VISTARIL ) 25 MG capsule Take 1 capsule (25 mg total) by mouth 3 (three) times daily as needed for anxiety. 90 capsule 1   ibuprofen (ADVIL) 200 MG tablet Take 400 mg by mouth every 6 (six) hours as needed for headache or moderate pain. (Patient not taking: Reported on 12/09/2022)     Insulin  Glargine (LANTUS  SOLOSTAR) 100 UNIT/ML Solostar Pen Inject 15 Units into the skin 2 (two) times daily. (Patient taking differently: Inject 15 Units into the skin at bedtime.) 15 mL 3   ketorolac  (ACULAR ) 0.5 % ophthalmic solution Place 1 drop into the right eye as needed.     melatonin 5 MG TABS Take 5 mg by mouth at bedtime as needed (sleep). (Patient not taking: Reported on 12/09/2022)     metFORMIN  (GLUCOPHAGE ) 1000 MG tablet Take 1 tablet (1,000 mg total) by mouth daily. (Patient taking differently: Take 1,000 mg by mouth 2 (two) times daily with a meal.) 180 tablet 0   Multiple Vitamins-Minerals (CENTRUM SILVER 50+MEN) TABS Take 1 tablet by mouth daily. (Patient not taking: Reported on 12/09/2022)     rosuvastatin  (CRESTOR ) 40 MG tablet Take 40 mg by mouth daily.     sertraline  (ZOLOFT ) 100 MG tablet Take 2 tablets (200 mg total) by mouth daily. 60 tablet 1   Study - OCEANIC-STROKE - asundexian 50 mg or placebo tablet (PI-Sethi) Take 1 tablet (50 mg total) by mouth daily. For Investigational Use Only. Take at the same time each day (preferably in the morning). Tablet should be swallowed whole with water; it CANNOT be crushed or broken. Please contact [facility] if you have any questions regarding this medication or study. 196 tablet 0   traZODone  (DESYREL ) 100 MG tablet Take 1 tablet (100 mg total) by mouth at  bedtime as needed for sleep. 30  tablet 1   No current facility-administered medications for this visit.    Psychiatric Specialty Exam: General Appearance: appears at stated age, casually dressed and groomed ***  Behavior: pleasant and cooperative ***  Psychomotor Activity: no psychomotor agitation or retardation noted ***  Eye Contact: fair *** Speech: normal amount, volume and fluency ***   Mood: euthymic *** Affect: congruent, pleasant and interactive ***  Thought Process: linear, goal directed, no circumstantial or tangential thought process noted, no racing thoughts or flight of ideas *** Descriptions of Associations: intact ***  Thought Content Hallucinations: denies AH, VH , does not appear responding to stimuli *** Delusions: no paranoia, delusions of control, grandeur, ideas of reference, thought broadcasting, and magical thinking *** Suicidal Thoughts: denies SI, intention, plan *** Homicidal Thoughts: denies HI, intention, plan ***  Alertness/Orientation: alert and fully oriented ***  Insight: fair*** Judgment: fair***  Memory: intact ***  Executive Functions  Concentration: intact *** Attention Span: fair *** Recall: intact *** Fund of Knowledge: fair ***  Physical Exam *** General: Pleasant, well-appearing ***. No acute distress. Pulmonary: Normal effort. No wheezing or rales. Skin: No obvious rash or lesions. Neuro: A&Ox3.No focal deficit.  Review of Systems *** No reported symptoms   Screenings: PHQ2-9    Flowsheet Row Counselor from 08/02/2022 in [facility] Counselor from 04/29/2022 in [facility] Counselor from 02/19/2022 in [facility] Counselor from 01/21/2022 in [facility]  PHQ-2 Total Score 3 3 6 5 3   PHQ-9 Total Score 12 11 15 25 15    Flowsheet Row Counselor from  [facility]21/2022 in [facility] (Discharged) from 10/31/2021 in [facility] to Hosp-Admission (Discharged) from 10/20/2021 in [facility]  C-SSRS RISK CATEGORY Error: Q3, 4, or 5 should not be populated when Q2 is No No Risk No Risk     Ismael Franco, MD PGY-3 Psychiatry Resident

## 2024-02-19 ENCOUNTER — Telehealth (HOSPITAL_COMMUNITY): Payer: Self-pay | Admitting: Psychiatry

## 2024-02-19 ENCOUNTER — Encounter (HOSPITAL_COMMUNITY): Admitting: Psychiatry

## 2024-02-19 NOTE — Telephone Encounter (Signed)
 Patient did not show up for the appointment.  Spoke with patient and would like to reschedule appointment for 9/11 at 3 PM. Patient reports having enough medications to make it to that appointment.   Ismael Franco, MD PGY-3 Psychiatry Resident

## 2024-02-25 ENCOUNTER — Ambulatory Visit (HOSPITAL_COMMUNITY): Admitting: Licensed Clinical Social Worker

## 2024-03-02 ENCOUNTER — Ambulatory Visit (HOSPITAL_COMMUNITY): Admitting: Licensed Clinical Social Worker

## 2024-03-02 DIAGNOSIS — F330 Major depressive disorder, recurrent, mild: Secondary | ICD-10-CM

## 2024-03-02 DIAGNOSIS — F411 Generalized anxiety disorder: Secondary | ICD-10-CM | POA: Insufficient documentation
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
## 2024-03-04 ENCOUNTER — Ambulatory Visit (INDEPENDENT_AMBULATORY_CARE_PROVIDER_SITE_OTHER): Payer: Self-pay

## 2024-03-04 DIAGNOSIS — I639 Cerebral infarction, unspecified: Secondary | ICD-10-CM | POA: Diagnosis not present

## 2024-03-04 LAB — CUP PACEART REMOTE DEVICE CHECK
Date Time Interrogation Session: 20250827231821
Implantable Pulse Generator Implant Date: 20230417

## 2024-03-07 ENCOUNTER — Ambulatory Visit: Payer: Self-pay | Admitting: Cardiology

## 2024-03-10 ENCOUNTER — Inpatient Hospital Stay (HOSPITAL_BASED_OUTPATIENT_CLINIC_OR_DEPARTMENT_OTHER)
Admission: EM | Admit: 2024-03-10 | Discharge: 2024-03-16 | DRG: 854 | Disposition: A | Discharge status: Home Health | Attending: Internal Medicine | Admitting: Internal Medicine

## 2024-03-10 ENCOUNTER — Emergency Department (HOSPITAL_BASED_OUTPATIENT_CLINIC_OR_DEPARTMENT_OTHER)

## 2024-03-10 ENCOUNTER — Other Ambulatory Visit: Payer: Self-pay

## 2024-03-10 DIAGNOSIS — K8001 Calculus of gallbladder with acute cholecystitis with obstruction: Secondary | ICD-10-CM | POA: Diagnosis present

## 2024-03-10 DIAGNOSIS — K82A1 Gangrene of gallbladder in cholecystitis: Secondary | ICD-10-CM | POA: Diagnosis present

## 2024-03-10 DIAGNOSIS — D7589 Other specified diseases of blood and blood-forming organs: Secondary | ICD-10-CM | POA: Diagnosis present

## 2024-03-10 DIAGNOSIS — Z888 Allergy status to other drugs, medicaments and biological substances status: Secondary | ICD-10-CM

## 2024-03-10 DIAGNOSIS — E119 Type 2 diabetes mellitus without complications: Secondary | ICD-10-CM

## 2024-03-10 DIAGNOSIS — Z538 Procedure and treatment not carried out for other reasons: Secondary | ICD-10-CM | POA: Diagnosis not present

## 2024-03-10 DIAGNOSIS — E785 Hyperlipidemia, unspecified: Secondary | ICD-10-CM | POA: Diagnosis present

## 2024-03-10 DIAGNOSIS — Z1152 Encounter for screening for COVID-19: Secondary | ICD-10-CM | POA: Diagnosis not present

## 2024-03-10 DIAGNOSIS — Z7985 Long-term (current) use of injectable non-insulin antidiabetic drugs: Secondary | ICD-10-CM | POA: Diagnosis not present

## 2024-03-10 DIAGNOSIS — Z833 Family history of diabetes mellitus: Secondary | ICD-10-CM | POA: Diagnosis not present

## 2024-03-10 DIAGNOSIS — Z7982 Long term (current) use of aspirin: Secondary | ICD-10-CM | POA: Diagnosis not present

## 2024-03-10 DIAGNOSIS — I10 Essential (primary) hypertension: Secondary | ICD-10-CM | POA: Diagnosis present

## 2024-03-10 DIAGNOSIS — Z8673 Personal history of transient ischemic attack (TIA), and cerebral infarction without residual deficits: Secondary | ICD-10-CM | POA: Diagnosis not present

## 2024-03-10 DIAGNOSIS — F17210 Nicotine dependence, cigarettes, uncomplicated: Secondary | ICD-10-CM | POA: Diagnosis present

## 2024-03-10 DIAGNOSIS — Z96653 Presence of artificial knee joint, bilateral: Secondary | ICD-10-CM | POA: Diagnosis present

## 2024-03-10 DIAGNOSIS — K810 Acute cholecystitis: Secondary | ICD-10-CM | POA: Diagnosis present

## 2024-03-10 DIAGNOSIS — E871 Hypo-osmolality and hyponatremia: Secondary | ICD-10-CM | POA: Diagnosis present

## 2024-03-10 DIAGNOSIS — F418 Other specified anxiety disorders: Secondary | ICD-10-CM | POA: Diagnosis not present

## 2024-03-10 DIAGNOSIS — A419 Sepsis, unspecified organism: Principal | ICD-10-CM | POA: Diagnosis present

## 2024-03-10 DIAGNOSIS — Z7902 Long term (current) use of antithrombotics/antiplatelets: Secondary | ICD-10-CM | POA: Diagnosis not present

## 2024-03-10 DIAGNOSIS — R7989 Other specified abnormal findings of blood chemistry: Secondary | ICD-10-CM | POA: Diagnosis present

## 2024-03-10 DIAGNOSIS — Z79899 Other long term (current) drug therapy: Secondary | ICD-10-CM | POA: Diagnosis not present

## 2024-03-10 DIAGNOSIS — Z794 Long term (current) use of insulin: Secondary | ICD-10-CM

## 2024-03-10 DIAGNOSIS — I679 Cerebrovascular disease, unspecified: Secondary | ICD-10-CM | POA: Diagnosis not present

## 2024-03-10 DIAGNOSIS — K819 Cholecystitis, unspecified: Principal | ICD-10-CM

## 2024-03-10 LAB — COMPREHENSIVE METABOLIC PANEL WITH GFR
ALT: 17 U/L (ref 0–44)
AST: 31 U/L (ref 15–41)
Albumin: 4.1 g/dL (ref 3.5–5.0)
Alkaline Phosphatase: 66 U/L (ref 38–126)
Anion gap: 15 (ref 5–15)
BUN: 12 mg/dL (ref 8–23)
CO2: 21 mmol/L — ABNORMAL LOW (ref 22–32)
Calcium: 10.4 mg/dL — ABNORMAL HIGH (ref 8.9–10.3)
Chloride: 95 mmol/L — ABNORMAL LOW (ref 98–111)
Creatinine, Ser: 0.98 mg/dL (ref 0.61–1.24)
GFR, Estimated: >60 mL/min (ref >60)
Glucose, Bld: 131 mg/dL — ABNORMAL HIGH (ref 70–99)
Potassium: 4.1 mmol/L (ref 3.5–5.1)
Sodium: 131 mmol/L — ABNORMAL LOW (ref 135–145)
Total Bilirubin: 2.3 mg/dL — ABNORMAL HIGH (ref 0.0–1.2)
Total Protein: 7.6 g/dL (ref 6.5–8.1)

## 2024-03-10 LAB — URINALYSIS, ROUTINE W REFLEX MICROSCOPIC

## 2024-03-10 LAB — TROPONIN T, HIGH SENSITIVITY
Troponin T High Sensitivity: <15 ng/L (ref 0–19)
Troponin T High Sensitivity: <15 ng/L (ref 0–19)

## 2024-03-10 LAB — URINALYSIS, MICROSCOPIC (REFLEX): Bacteria, UA: NONE SEEN

## 2024-03-10 LAB — CBC
HCT: 44.7 % (ref 39.0–52.0)
Hemoglobin: 16.3 g/dL (ref 13.0–17.0)
MCH: 36.6 pg — ABNORMAL HIGH (ref 26.0–34.0)
MCHC: 36.5 g/dL — ABNORMAL HIGH (ref 30.0–36.0)
MCV: 100.4 fL — ABNORMAL HIGH (ref 80.0–100.0)
Platelets: 215 K/uL (ref 150–400)
RBC: 4.45 MIL/uL (ref 4.22–5.81)
RDW: 13.1 % (ref 11.5–15.5)
WBC: 26.2 K/uL — ABNORMAL HIGH (ref 4.0–10.5)
nRBC: 0 % (ref 0.0–0.2)

## 2024-03-10 LAB — GLUCOSE, CAPILLARY: Glucose-Capillary: 121 mg/dL — ABNORMAL HIGH (ref 70–99)

## 2024-03-10 LAB — PRO BRAIN NATRIURETIC PEPTIDE: Pro Brain Natriuretic Peptide: 183 pg/mL (ref <300.0)

## 2024-03-10 LAB — RESP PANEL BY RT-PCR (RSV, FLU A&B, COVID)  RVPGX2
Influenza A by PCR: NEGATIVE
Influenza B by PCR: NEGATIVE
Resp Syncytial Virus by PCR: NEGATIVE
SARS Coronavirus 2 by RT PCR: NEGATIVE

## 2024-03-10 LAB — LIPASE, BLOOD: Lipase: 14 U/L (ref 11–51)

## 2024-03-10 LAB — LACTIC ACID, PLASMA: Lactic Acid, Venous: 1.8 mmol/L (ref 0.5–1.9)

## 2024-03-10 MED ORDER — MORPHINE SULFATE (PF) 4 MG/ML IV SOLN
4.0000 mg | Freq: Once | INTRAVENOUS | Status: AC
  Administered 2024-03-10: 4 mg via INTRAVENOUS
  Filled 2024-03-10: qty 1

## 2024-03-10 MED ORDER — SODIUM CHLORIDE 0.9 % IV BOLUS
1000.0000 mL | Freq: Once | INTRAVENOUS | Status: AC
  Administered 2024-03-10: 1000 mL via INTRAVENOUS

## 2024-03-10 MED ORDER — PROCHLORPERAZINE EDISYLATE 10 MG/2ML IJ SOLN
10.0000 mg | Freq: Once | INTRAMUSCULAR | Status: AC
  Administered 2024-03-10: 10 mg via INTRAVENOUS
  Filled 2024-03-10: qty 2

## 2024-03-10 MED ORDER — LACTATED RINGERS IV SOLN: INTRAVENOUS | Status: AC

## 2024-03-10 MED ORDER — AMLODIPINE BESYLATE 10 MG PO TABS
10.0000 mg | ORAL_TABLET | Freq: Every day | ORAL | Status: DC
  Administered 2024-03-11 – 2024-03-16 (×5): 10 mg via ORAL
  Filled 2024-03-10 (×6): qty 1

## 2024-03-10 MED ORDER — INSULIN ASPART 100 UNIT/ML IJ SOLN
0.0000 [IU] | Freq: Three times a day (TID) | INTRAMUSCULAR | Status: DC
  Administered 2024-03-14: 1 [IU] via SUBCUTANEOUS
  Administered 2024-03-14: 2 [IU] via SUBCUTANEOUS

## 2024-03-10 MED ORDER — SERTRALINE HCL 100 MG PO TABS
200.0000 mg | ORAL_TABLET | Freq: Every day | ORAL | Status: DC
  Administered 2024-03-11 – 2024-03-16 (×5): 200 mg via ORAL
  Filled 2024-03-10 (×6): qty 2

## 2024-03-10 MED ORDER — DIPHENHYDRAMINE HCL 50 MG/ML IJ SOLN
25.0000 mg | Freq: Once | INTRAMUSCULAR | Status: AC
  Administered 2024-03-10: 25 mg via INTRAVENOUS
  Filled 2024-03-10: qty 1

## 2024-03-10 MED ORDER — ROSUVASTATIN CALCIUM 20 MG PO TABS
40.0000 mg | ORAL_TABLET | Freq: Every day | ORAL | Status: DC
  Administered 2024-03-12 – 2024-03-16 (×4): 40 mg via ORAL
  Filled 2024-03-10 (×5): qty 2

## 2024-03-10 MED ORDER — PIPERACILLIN-TAZOBACTAM 3.375 G IVPB 30 MIN
3.3750 g | Freq: Once | INTRAVENOUS | Status: AC
  Administered 2024-03-10: 3.375 g via INTRAVENOUS
  Filled 2024-03-10: qty 50

## 2024-03-10 MED ORDER — IOHEXOL 350 MG/ML SOLN
100.0000 mL | Freq: Once | INTRAVENOUS | Status: AC | PRN
  Administered 2024-03-10: 100 mL via INTRAVENOUS

## 2024-03-10 MED ORDER — TRAZODONE HCL 50 MG PO TABS
100.0000 mg | ORAL_TABLET | Freq: Every evening | ORAL | Status: DC | PRN
  Administered 2024-03-14 – 2024-03-15 (×2): 100 mg via ORAL
  Filled 2024-03-10 (×2): qty 2

## 2024-03-10 MED ORDER — HYDROMORPHONE HCL 1 MG/ML IJ SOLN
0.5000 mg | INTRAMUSCULAR | Status: DC | PRN
  Administered 2024-03-11 – 2024-03-15 (×11): 0.5 mg via INTRAVENOUS
  Filled 2024-03-10 (×11): qty 0.5

## 2024-03-10 NOTE — ED Notes (Signed)
 Called [facility] to transport the patient to [facility] rm#[id number]

## 2024-03-10 NOTE — Progress Notes (Signed)
 RN messaged admitting to let them know pt has arrived and needs orders.

## 2024-03-10 NOTE — Progress Notes (Deleted)
 [facility] MD/PA/NP OP Progress Note  03/10/2024 7:30 PM PER BEAGLEY  MRN:  [id number]   Assessment and Plan: ***  # MDD recurrent, mild w/ anxious distress # Insomnia-acute exacerbation # PTSD - Continue Zoloft  to 200mg  daily - Continue trazodone  100 mg nightly - Continue hydroxyzine  25mg  TID PRN, for anxiety - Therapy appointment via referral 10/01/2023  # Tobacco use d/o - Not quite ready to quit, but wants to quit in the future  # Hx of Complicated grief-resolved  Identifying information: Jerry [name] is a 68 year old patient with a past psychiatric history of reported PTSD, MDD, GAD who presents today for medication management.  Chief Complaint:  No chief complaint on file.   Subjective   Patient seen ***.  Patient reports feeling *** today. Since the previous visit, ***. Regarding medications, patient notes ***. Patient reports the following adverse effects: ***.   Patient reports *** sleep, ***. Patient reports *** appetite, ***.   Patient rates anxiety a ***/10, depression a ***/10, and anger a ***/10.   Patient denies current SI, HI, and AVH. ***  Stressors include ***.   Substance use: ***  Forensic psychologist Tobacco     Visit Diagnosis:  No diagnosis found.   Past Psychiatric History: Previously seen at [facility] for therapy and medication management; diagnosed with PTSD, MDD and GAD.  Completed therapy with Dr. Juleen 01/24/2022 - 11/25/2022.   Previous medications: trazodone  and Lexapro  02/2023-endorses feeling that he has moved on to acceptance of both his mother's death and the passing of multiple family friends and members. Continued Zoloft  150mg  aily and trazodone  100mg  at bedtime 05/2023- Patient endorsed increased irritability, insomnia and dysphoria with triggers being ongoing family issues. Increased Zoloft  to 200mg  DAILY and started Hydroxyzine  25mg  TID PRN for anxiety symptoms.   No history of inpatient psychiatric care - No history of suicide  attempts   Past Medical History:  Past Medical History:  Diagnosis Date   Arthritis    Diabetes mellitus 2008   type II   Dyslipidemia    Erectile dysfunction    History of cardiac catheterization 06/2013   Dr. Peter Swaziland   HTN (hypertension)    Hypogonadism male     Past Surgical History:  Procedure Laterality Date   APPENDECTOMY     BUBBLE STUDY  11/02/2021   Procedure: BUBBLE STUDY;  Surgeon: Alvan Ronal BRAVO, MD;  Location: [facility] ENDOSCOPY;  Service: Cardiovascular;;   COLONOSCOPY  08/2006   Dr. Aneita - repeat 2018   INCISION AND DRAINAGE PERIRECTAL ABSCESS     KNEE ARTHROSCOPY Left 12/28/2014   Procedure: ARTHROSCOPY LEFT KNEE WITH  MEDIAL MENSICUS DEBRIDEMENT;  Surgeon: Dempsey Moan, MD;  Location: [facility] ORS;  Service: Orthopedics;  Laterality: Left;   KNEE SURGERY     left knee - remote past   LEFT HEART CATHETERIZATION WITH CORONARY ANGIOGRAM N/A 06/11/2013   Procedure: LEFT HEART CATHETERIZATION WITH CORONARY ANGIOGRAM;  Surgeon: Peter M Swaziland, MD;  Location: [facility] CATH LAB;  Service: Cardiovascular;  Laterality: N/A;   LOOP RECORDER INSERTION N/A 10/22/2021   Procedure: LOOP RECORDER INSERTION;  Surgeon: Fernande Elspeth BROCKS, MD;  [facility] INVASIVE CV LAB;  Service: Cardiovascular;  Laterality: N/A;   right arm and elbow surgery     TEE WITHOUT CARDIOVERSION N/A 11/02/2021   Procedure: TRANSESOPHAGEAL ECHOCARDIOGRAM (TEE);  Surgeon: Alvan Ronal BRAVO, MD;  Location: [facility] ENDOSCOPY;  Service: Cardiovascular;  Laterality: N/A;   TONSILLECTOMY     TOTAL KNEE ARTHROPLASTY Left 10/04/2015  Procedure: LEFT TOTAL KNEE ARTHROPLASTY right knee aspiration and injection;  Surgeon: Dempsey Moan, MD;  Location: [facility] ORS;  Service: Orthopedics;  Laterality: Left;   TOTAL KNEE ARTHROPLASTY Right 11/04/2016   Procedure: RIGHT TOTAL KNEE ARTHROPLASTY;  Surgeon: Dempsey Moan, MD;  Location: [facility] ORS;  Service: Orthopedics;  Laterality: Right;  with abductor block   wisdom teeth extractions      Family  Psychiatric History: Granddaughter: Deceased, substance use disorder, bipolar, PTSD - History of depression in multiple sisters - Oldest sister: EtOH use disorder Mother: Dementia   Family History:  Family History  Problem Relation Age of Onset   Diabetes Mother    Other Father        death by MVA   Other Brother        died of MVA   Stroke Neg Hx    Heart disease Neg Hx    Cancer Neg Hx    Hypertension Neg Hx     Social History:  Social History   Socioeconomic History   Marital status: Married    Spouse name: Not on file   Number of children: Not on file   Years of education: Not on file   Highest education level: Not on file  Occupational History   Occupation: retired    Comment: prior physical therapist  Tobacco Use   Smoking status: Every Day    Current packs/day: 0.25    Types: Cigarettes   Smokeless tobacco: Never   Tobacco comments:    Started smoking again, 1 pack every 4 days  Substance and Sexual Activity   Alcohol use: No   Drug use: No   Sexual activity: Never  Other Topics Concern   Not on file  Social History Narrative   Married 22 years, 2 daughter and 1 son. Retired, used to work as a PT.  Exercises treadmill daily, some weight bearing exercise. Diet    Social Drivers of Corporate investment banker Strain: Not on file  Food Insecurity: High Risk (05/28/2023)   Received from [facility]   Hunger Vital Sign    Within the past 12 months, you worried that your food would run out before you got money to buy more: Never true    Within the past 12 months, the food you bought just didn't last and you didn't have money to get more. : Often true  Transportation Needs: No Transportation Needs (05/28/2023)   Received from [facility]    In the past 12 months, has lack of reliable transportation kept you from medical appointments, meetings, work or from getting things needed for daily living? : No  Physical Activity: Not on file   Stress: Not on file  Social Connections: Not on file    Allergies:  Allergies  Allergen Reactions   Diclofenac  Sodium Other (See Comments)    Gel caused a burning sensation    Lisinopril  Cough   Losartan  Cough    Unknown bad side effects Unknown bad side effects    Metabolic Disorder Labs: Lab Results  Component Value Date   HGBA1C 6.2 (H) 10/21/2021   MPG 131.24 10/21/2021   MPG 131 09/28/2015   Lab Results  Component Value Date   PROLACTIN 3.7 11/15/2011   Lab Results  Component Value Date   CHOL 163 10/21/2021   TRIG 84 10/21/2021   HDL 58 10/21/2021   CHOLHDL 2.8 10/21/2021   VLDL 17 10/21/2021   LDLCALC 88 10/21/2021   LDLCALC  76 08/23/2014   Lab Results  Component Value Date   TSH 1.841 01/14/2014   TSH 1.304 11/15/2011    Therapeutic Level Labs: No results found for: LITHIUM No results found for: VALPROATE No results found for: CBMZ  Current Medications: Current Outpatient Medications  Medication Sig Dispense Refill   acetaminophen  (TYLENOL ) 500 MG tablet Take 1,000 mg by mouth every 6 (six) hours as needed for mild pain.     amLODipine  (NORVASC ) 10 MG tablet Take 10 mg by mouth daily.     aspirin  EC 81 MG tablet Take 1 tablet (81 mg total) by mouth daily. Swallow whole. 30 tablet    Blood Glucose Monitoring Suppl (GLUCOCOM BLOOD GLUCOSE MONITOR) DEVI Please check blood sugar 5 times per day due to fluctuating blood sugars.     clopidogrel  (PLAVIX ) 75 MG tablet Take 1 tablet (75 mg total) by mouth daily. 90 tablet 3   docusate sodium  (COLACE) 100 MG capsule Take 1 capsule (100 mg total) by mouth every 12 (twelve) hours. (Patient taking differently: Take 100 mg by mouth 2 (two) times daily as needed for moderate constipation.) 30 capsule 0   ezetimibe  (ZETIA ) 10 MG tablet Take 1 tablet (10 mg total) by mouth daily. 90 tablet 3   glucose blood test strip 2 times per day     HYDROcodone -acetaminophen  (NORCO/VICODIN) 5-325 MG tablet Take 1 tablet  by mouth as needed.     hydrOXYzine  (VISTARIL ) 25 MG capsule Take 1 capsule (25 mg total) by mouth 3 (three) times daily as needed for anxiety. 90 capsule 1   ibuprofen (ADVIL) 200 MG tablet Take 400 mg by mouth every 6 (six) hours as needed for headache or moderate pain. (Patient not taking: Reported on 12/09/2022)     Insulin  Glargine (LANTUS  SOLOSTAR) 100 UNIT/ML Solostar Pen Inject 15 Units into the skin 2 (two) times daily. (Patient taking differently: Inject 15 Units into the skin at bedtime.) 15 mL 3   ketorolac  (ACULAR ) 0.5 % ophthalmic solution Place 1 drop into the right eye as needed.     melatonin 5 MG TABS Take 5 mg by mouth at bedtime as needed (sleep). (Patient not taking: Reported on 12/09/2022)     metFORMIN  (GLUCOPHAGE ) 1000 MG tablet Take 1 tablet (1,000 mg total) by mouth daily. (Patient taking differently: Take 1,000 mg by mouth 2 (two) times daily with a meal.) 180 tablet 0   MOUNJARO 15 MG/0.5ML Pen Inject 15 mg into the skin once a week.     Multiple Vitamins-Minerals (CENTRUM SILVER 50+MEN) TABS Take 1 tablet by mouth daily. (Patient not taking: Reported on 12/09/2022)     rosuvastatin  (CRESTOR ) 40 MG tablet Take 40 mg by mouth daily.     sertraline  (ZOLOFT ) 100 MG tablet Take 2 tablets (200 mg total) by mouth daily. 60 tablet 1   Study - OCEANIC-STROKE - asundexian 50 mg or placebo tablet (PI-Sethi) Take 1 tablet (50 mg total) by mouth daily. For Investigational Use Only. Take at the same time each day (preferably in the morning). Tablet should be swallowed whole with water; it CANNOT be crushed or broken. Please contact [facility] if you have any questions regarding this medication or study. 196 tablet 0   traZODone  (DESYREL ) 100 MG tablet Take 1 tablet (100 mg total) by mouth at bedtime as needed for sleep. 30 tablet 1   No current facility-administered medications for this visit.    Psychiatric Specialty Exam: General Appearance: appears at stated age,  casually  dressed and groomed ***  Behavior: pleasant and cooperative ***  Psychomotor Activity: no psychomotor agitation or retardation noted ***  Eye Contact: fair *** Speech: normal amount, volume and fluency ***   Mood: euthymic *** Affect: congruent, pleasant and interactive ***  Thought Process: linear, goal directed, no circumstantial or tangential thought process noted, no racing thoughts or flight of ideas *** Descriptions of Associations: intact ***  Thought Content Hallucinations: denies AH, VH , does not appear responding to stimuli *** Delusions: no paranoia, delusions of control, grandeur, ideas of reference, thought broadcasting, and magical thinking *** Suicidal Thoughts: denies SI, intention, plan *** Homicidal Thoughts: denies HI, intention, plan ***  Alertness/Orientation: alert and fully oriented ***  Insight: fair*** Judgment: fair***  Memory: intact ***  Executive Functions  Concentration: intact *** Attention Span: fair *** Recall: intact *** Fund of Knowledge: fair ***  Physical Exam *** General: Pleasant, well-appearing ***. No acute distress. Pulmonary: Normal effort. No wheezing or rales. Skin: No obvious rash or lesions. Neuro: A&Ox3.No focal deficit.  Review of Systems *** No reported symptoms   Screenings: GAD-7    Advertising copywriter from 03/02/2024 in [facility]  Total GAD-7 Score 10   PHQ2-9    Flowsheet Row Counselor from 03/02/2024 in [facility] Counselor from 08/02/2022 in [facility] Counselor from 04/29/2022 in [facility] Counselor from 02/19/2022 in [facility] Counselor from 01/21/2022 in [facility]  PHQ-2 Total Score 3 3 3 6 5   PHQ-9 Total Score 10 12 11 15 25    Flowsheet Row ED from 03/10/2024 in [facility] Emergency Department at [facility] Counselor from 03/02/2024 in [facility] Counselor from 01/21/2022 in [facility]  C-SSRS RISK CATEGORY No Risk No Risk Error: Q3, 4, or 5 should not be populated when Q2 is No     Ismael Franco, MD PGY-3 Psychiatry Resident

## 2024-03-10 NOTE — ED Provider Notes (Signed)
 [place] EMERGENCY DEPARTMENT AT [facility] Provider Note   CSN: [id number] Arrival date & time: 03/10/24  1149     Patient presents with: Emesis   Jerry [name] is a 68 y.o. male lifelong smoker, history of hypertension, CVA, diabetes presents with complaints of generalized malaise, dyspnea on exertion, lightheadedness, cough.  Patient states that his symptoms have been ongoing for the past several days.  Endorses abdominal pain as well with associated nausea and vomiting.  No diarrhea.  No urinary symptoms.  Does report that he has been having night sweats and unintentional weight loss.  Reports nearly 70 pound weight loss in the past year.  Has no cardiac history.  No prior blood clots.  Is not on blood thinners.   =   Emesis     Past Medical History:  Diagnosis Date   Arthritis    Diabetes mellitus 2008   type II   Dyslipidemia    Erectile dysfunction    History of cardiac catheterization 06/2013   Dr. Peter Swaziland   HTN (hypertension)    Hypogonadism male    Past Surgical History:  Procedure Laterality Date   APPENDECTOMY     BUBBLE STUDY  11/02/2021   Procedure: BUBBLE STUDY;  Surgeon: Alvan Ronal BRAVO, MD;  Location: [facility] ENDOSCOPY;  Service: Cardiovascular;;   COLONOSCOPY  08/2006   Dr. Aneita - repeat 2018   INCISION AND DRAINAGE PERIRECTAL ABSCESS     KNEE ARTHROSCOPY Left 12/28/2014   Procedure: ARTHROSCOPY LEFT KNEE WITH  MEDIAL MENSICUS DEBRIDEMENT;  Surgeon: Dempsey Moan, MD;  Location: [facility] ORS;  Service: Orthopedics;  Laterality: Left;   KNEE SURGERY     left knee - remote past   LEFT HEART CATHETERIZATION WITH CORONARY ANGIOGRAM N/A 06/11/2013   Procedure: LEFT HEART CATHETERIZATION WITH CORONARY ANGIOGRAM;  Surgeon: Peter M Swaziland, MD;  Location: [facility] CATH LAB;  Service: Cardiovascular;  Laterality: N/A;   LOOP RECORDER INSERTION N/A 10/22/2021   Procedure: LOOP RECORDER INSERTION;  Surgeon: Fernande Elspeth BROCKS, MD;  Location: [facility] INVASIVE CV LAB;   Service: Cardiovascular;  Laterality: N/A;   right arm and elbow surgery     TEE WITHOUT CARDIOVERSION N/A 11/02/2021   Procedure: TRANSESOPHAGEAL ECHOCARDIOGRAM (TEE);  Surgeon: Alvan Ronal BRAVO, MD;  Location: [facility] ENDOSCOPY;  Service: Cardiovascular;  Laterality: N/A;   TONSILLECTOMY     TOTAL KNEE ARTHROPLASTY Left 10/04/2015   Procedure: LEFT TOTAL KNEE ARTHROPLASTY right knee aspiration and injection;  Surgeon: Dempsey Moan, MD;  Location: [facility] ORS;  Service: Orthopedics;  Laterality: Left;   TOTAL KNEE ARTHROPLASTY Right 11/04/2016   Procedure: RIGHT TOTAL KNEE ARTHROPLASTY;  Surgeon: Dempsey Moan, MD;  Location: [facility] ORS;  Service: Orthopedics;  Laterality: Right;  with abductor block   wisdom teeth extractions       Prior to Admission medications   Medication Sig Start Date End Date Taking? Authorizing Provider  acetaminophen  (TYLENOL ) 500 MG tablet Take 1,000 mg by mouth every 6 (six) hours as needed for mild pain.    [provider]  amLODipine  (NORVASC ) 10 MG tablet Take 10 mg by mouth daily.    [provider]  aspirin  EC 81 MG tablet Take 1 tablet (81 mg total) by mouth daily. Swallow whole. 06/04/22   Alvan Ronal BRAVO, MD  Blood Glucose Monitoring Suppl (GLUCOCOM BLOOD GLUCOSE MONITOR) DEVI Please check blood sugar 5 times per day due to fluctuating blood sugars. 05/31/16   [provider]  clopidogrel  (PLAVIX ) 75 MG tablet  Take 1 tablet (75 mg total) by mouth daily. 06/04/22   Alvan Ronal BRAVO, MD  docusate sodium  (COLACE) 100 MG capsule Take 1 capsule (100 mg total) by mouth every 12 (twelve) hours. Patient taking differently: Take 100 mg by mouth 2 (two) times daily as needed for moderate constipation. 08/13/21   Keith Sor, PA-C  ezetimibe  (ZETIA ) 10 MG tablet Take 1 tablet (10 mg total) by mouth daily. 12/04/21   Alvan Ronal BRAVO, MD  glucose blood test strip 2 times per day 05/31/16   [provider]  HYDROcodone -acetaminophen  (NORCO/VICODIN) 5-325 MG  tablet Take 1 tablet by mouth as needed.    [provider]  hydrOXYzine  (VISTARIL ) 25 MG capsule Take 1 capsule (25 mg total) by mouth 3 (three) times daily as needed for anxiety. 08/15/23   McQuilla, Jai B, MD  ibuprofen (ADVIL) 200 MG tablet Take 400 mg by mouth every 6 (six) hours as needed for headache or moderate pain. Patient not taking: Reported on 12/09/2022    [provider]  Insulin  Glargine (LANTUS  SOLOSTAR) 100 UNIT/ML Solostar Pen Inject 15 Units into the skin 2 (two) times daily. Patient taking differently: Inject 15 Units into the skin at bedtime. 12/28/14   Aluisio, Dempsey, MD  ketorolac  (ACULAR ) 0.5 % ophthalmic solution Place 1 drop into the right eye as needed.    [provider]  melatonin 5 MG TABS Take 5 mg by mouth at bedtime as needed (sleep). Patient not taking: Reported on 12/09/2022    [provider]  metFORMIN  (GLUCOPHAGE ) 1000 MG tablet Take 1 tablet (1,000 mg total) by mouth daily. Patient taking differently: Take 1,000 mg by mouth 2 (two) times daily with a meal. 12/28/14   Aluisio, Dempsey, MD  MOUNJARO 15 MG/0.5ML Pen Inject 15 mg into the skin once a week.    [provider]  Multiple Vitamins-Minerals (CENTRUM SILVER 50+MEN) TABS Take 1 tablet by mouth daily. Patient not taking: Reported on 12/09/2022    [provider]  rosuvastatin  (CRESTOR ) 40 MG tablet Take 40 mg by mouth daily. 10/23/21   [provider]  sertraline  (ZOLOFT ) 100 MG tablet Take 2 tablets (200 mg total) by mouth daily. 12/03/23   McQuilla, Jai B, MD  Study [facility]ER - asundexian 50 mg or placebo tablet (PI-Sethi) Take 1 tablet (50 mg total) by mouth daily. For Investigational Use Only. Take at the same time each day (preferably in the morning). Tablet should be swallowed whole with water; it CANNOT be crushed or broken. Please contact [facility] if you have any questions regarding this medication or study. 10/27/23   Sethi,  Pramod S, MD  traZODone  (DESYREL ) 100 MG tablet Take 1 tablet (100 mg total) by mouth at bedtime as needed for sleep. 08/15/23   McQuilla, Jai B, MD    Allergies: Diclofenac  sodium, Lisinopril , and Losartan     Review of Systems  Gastrointestinal:  Positive for vomiting.    Updated Vital Signs BP (!) 161/88   Pulse (!) 107   Temp 98.5 F (36.9 C) (Oral)   Resp 18   SpO2 96%   Physical Exam Vitals and nursing note reviewed.  Constitutional:      General: He is not in acute distress.    Appearance: He is well-developed.  HENT:     Head: Normocephalic and atraumatic.  Eyes:     Conjunctiva/sclera: Conjunctivae normal.  Cardiovascular:     Rate and Rhythm: Normal rate and regular rhythm.  Heart sounds: No murmur heard. Pulmonary:     Effort: Pulmonary effort is normal. No respiratory distress.     Breath sounds: Normal breath sounds.  Abdominal:     Palpations: Abdomen is soft.     Tenderness: There is abdominal tenderness.     Comments: +murphy signs, no tenderness of McBurney's point, abdomen soft, nondistended  Musculoskeletal:        General: No swelling.     Cervical back: Neck supple.  Skin:    General: Skin is warm and dry.     Capillary Refill: Capillary refill takes less than 2 seconds.  Neurological:     Mental Status: He is alert.  Psychiatric:        Mood and Affect: Mood normal.     (all labs ordered are listed, but only abnormal results are displayed) Labs Reviewed  COMPREHENSIVE METABOLIC PANEL WITH GFR - Abnormal; Notable for the following components:      Result Value   Sodium 131 (*)    Chloride 95 (*)    CO2 21 (*)    Glucose, Bld 131 (*)    Calcium  10.4 (*)    Total Bilirubin 2.3 (*)    All other components within normal limits  CBC - Abnormal; Notable for the following components:   WBC 26.2 (*)    MCV 100.4 (*)    MCH 36.6 (*)    MCHC 36.5 (*)    All other components within normal limits  URINALYSIS, ROUTINE W REFLEX MICROSCOPIC -  Abnormal; Notable for the following components:   Color, Urine BROWN (*)    APPearance HAZY (*)    Glucose, UA   (*)    Value: TEST NOT REPORTED DUE TO COLOR INTERFERENCE OF URINE PIGMENT   Hgb urine dipstick   (*)    Value: TEST NOT REPORTED DUE TO COLOR INTERFERENCE OF URINE PIGMENT   Bilirubin Urine   (*)    Value: TEST NOT REPORTED DUE TO COLOR INTERFERENCE OF URINE PIGMENT   Ketones, ur   (*)    Value: TEST NOT REPORTED DUE TO COLOR INTERFERENCE OF URINE PIGMENT   Protein, ur   (*)    Value: TEST NOT REPORTED DUE TO COLOR INTERFERENCE OF URINE PIGMENT   Nitrite   (*)    Value: TEST NOT REPORTED DUE TO COLOR INTERFERENCE OF URINE PIGMENT   Leukocytes,Ua   (*)    Value: TEST NOT REPORTED DUE TO COLOR INTERFERENCE OF URINE PIGMENT   All other components within normal limits  RESP PANEL BY RT-PCR (RSV, FLU A&B, COVID)  RVPGX2  CULTURE, BLOOD (ROUTINE X 2)  CULTURE, BLOOD (ROUTINE X 2)  LIPASE, BLOOD  PRO BRAIN NATRIURETIC PEPTIDE  LACTIC ACID, PLASMA  URINALYSIS, MICROSCOPIC (REFLEX)  TROPONIN T, HIGH SENSITIVITY  TROPONIN T, HIGH SENSITIVITY    EKG: None  Radiology: US  Abdomen Limited RUQ (LIVER/GB) Result Date: 03/10/2024 CLINICAL DATA:  Abdominal pain. EXAM: ULTRASOUND ABDOMEN LIMITED RIGHT UPPER QUADRANT COMPARISON:  CT scan of same day. FINDINGS: Gallbladder: Cholelithiasis is noted with moderate gallbladder wall thickening measured at 8 mm. No sonographic Murphy's sign is noted, but pericholecystic fluid is noted. Sludge is noted as well. Largest gallstone measures 5 mm. Probable 5 mm gallbladder polyp is noted as well. Common bile duct: Diameter: 5 mm which is within normal limits Liver: No focal lesion identified. Within normal limits in parenchymal echogenicity. Portal vein is patent on color Doppler imaging with normal direction of blood flow towards the  liver. Other: None. IMPRESSION: 1. Cholelithiasis is noted with moderate gallbladder wall thickening and pericholecystic  fluid. No sonographic Murphy's sign is noted. Findings are concerning for possible cholecystitis. HIDA scan may be performed for further evaluation. 2. Probable 5 mm gallbladder polyp is noted. Electronically Signed   By: Lynwood Landy Raddle M.D.   On: 03/10/2024 15:49   CT ABDOMEN PELVIS W CONTRAST Result Date: 03/10/2024 CLINICAL DATA:  Abdominal pain EXAM: CT ABDOMEN AND PELVIS WITH CONTRAST TECHNIQUE: Multidetector CT imaging of the abdomen and pelvis was performed using the standard protocol following bolus administration of intravenous contrast. RADIATION DOSE REDUCTION: This exam was performed according to the departmental dose-optimization program which includes automated exposure control, adjustment of the mA and/or kV according to patient size and/or use of iterative reconstruction technique. CONTRAST:  OMNIPAQUE  IOHEXOL  350 MG/ML SOLN COMPARISON:  CT angio chest March 10, 2024 on August 15, 2022 FINDINGS: Lower chest: Right lung base scarring/subsegmental atelectasis. Hepatobiliary: Hypoattenuating liver parenchyma consistent with hepatic steatosis. Focal fat deposition is segment 4 measuring 1.4 cm, typical location. Gallbladder wall thickening and pericholecystic and subhepatic mild fat stranding with associated cholelithiasis suggestive of acute infectious/inflammatory changes. No pericholecystic fluid collection. Pancreas: Mild atrophic changes of the pancreas. No pancreatic ductal dilatation or mass lesion. Spleen: Normal in size without focal abnormality. Adrenals/Urinary Tract: Simple renal cortical cysts in both kidneys which does not require imaging follow-up. Otherwise unremarkable. No hydronephrosis. Adrenal glands are unremarkable. Bladder is thick-walled and under distended. Stomach/Bowel: Stomach is within normal limits. Scattered colonic diverticula. No evidence of bowel wall thickening, distention, or inflammatory changes. Vascular/Lymphatic: No significant vascular findings are  present. No enlarged abdominal or pelvic lymph nodes. Reproductive: Prostatomegaly. Other: Tiny fat containing paraumbilical hernia with a defect measuring 7 mm. Bilateral fat containing inguinal hernias left greater than right. Musculoskeletal: Multilevel degenerative changes of the spine. IMPRESSION: Cholelithiasis with CT findings suggestive of acute cholecystitis. Correlate with clinical findings. Hepatic steatosis with focal fat in segment 4. Additional findings as above. Electronically Signed   By: Megan  Zare M.D.   On: 03/10/2024 14:51   CT Angio Chest PE W and/or Wo Contrast Addendum Date: 03/10/2024 ADDENDUM REPORT: 03/10/2024 14:26 ADDENDUM: Gallstones with gallbladder distension and inflammatory stranding surrounding the gallbladder concerning for acute cholecystitis. This could be further evaluated with right upper quadrant ultrasound. These results were called by telephone at the time of interpretation on 03/10/2024 at 2:25 pm to provider [facility] , who verbally acknowledged these results. Electronically Signed   By: Franky Crease M.D.   On: 03/10/2024 14:26   Result Date: 03/10/2024 CLINICAL DATA:  Pulmonary embolism (PE) suspected, high prob. Weakness, dizziness, vomiting EXAM: CT ANGIOGRAPHY CHEST WITH CONTRAST TECHNIQUE: Multidetector CT imaging of the chest was performed using the standard protocol during bolus administration of intravenous contrast. Multiplanar CT image reconstructions and MIPs were obtained to evaluate the vascular anatomy. RADIATION DOSE REDUCTION: This exam was performed according to the departmental dose-optimization program which includes automated exposure control, adjustment of the mA and/or kV according to patient size and/or use of iterative reconstruction technique. CONTRAST:  OMNIPAQUE  IOHEXOL  350 MG/ML SOLN COMPARISON:  08/15/2022 FINDINGS: Cardiovascular: Three-vessel coronary artery disease and aortic atherosclerosis. Heart is normal size. Aorta is  normal caliber. No filling defects in the pulmonary arteries to suggest pulmonary emboli. Mediastinum/Nodes: No mediastinal, hilar, or axillary adenopathy. Trachea and esophagus are unremarkable. Thyroid  unremarkable. Lungs/Pleura: Linear areas of atelectasis in the lower lobes bilaterally. Nodular area in the lingula is  new since prior study and may also reflect atelectasis, measuring 12 mm. This warrants follow-up. No effusions. Upper Abdomen: Gallbladder is distended with layering gallstones. Surrounding inflammatory change. Findings concerning for acute cholecystitis. Musculoskeletal: Chest wall soft tissues are unremarkable. No acute bony abnormality. Review of the MIP images confirms the above findings. IMPRESSION: No evidence of pulmonary embolus. Three-vessel coronary artery disease. Lower lobe atelectasis. 12 mm nodular density in the lingula may also reflect atelectasis but recommend follow-up CT and 6 months to ensure resolution. Aortic Atherosclerosis (ICD10-I70.0). Electronically Signed: By: Franky Crease M.D. On: 03/10/2024 14:12     Procedures   Medications Ordered in the ED  sodium chloride  0.9 % bolus 1,000 mL (0 mLs Intravenous Stopped 03/10/24 1519)  iohexol  (OMNIPAQUE ) 350 MG/ML injection 100 mL (100 mLs Intravenous Contrast Given 03/10/24 1330)  piperacillin -tazobactam (ZOSYN ) IVPB 3.375 g (0 g Intravenous Stopped 03/10/24 1600)  sodium chloride  0.9 % bolus 1,000 mL (1,000 mLs Intravenous New Bag/Given 03/10/24 1543)  morphine  (PF) 4 MG/ML injection 4 mg (4 mg Intravenous Given 03/10/24 1651)    Clinical Course as of 03/10/24 1717  Wed Mar 10, 2024  1347 Patient evaluated for complaints of dyspnea on exertion, lightheadedness, abdominal pain, nausea and vomiting over the past several days.  Also associated with night sweats and unintentional weight loss.  Patient is noted to be tachycardic upon arrival, otherwise hemodynamically stable.  He has tenderness to the epigastrium and right upper  quadrant regions.  His lungs are clear.  Will begin broad workup. [JT]  1348 CBC(!) Significant leukocytosis of 26.2  [JT]  1348 Comprehensive metabolic panel(!) Mild hyponatremia [JT]  1400 Resp panel by RT-PCR (RSV, Flu A&B, Covid) Anterior Nasal Swab Negative [JT]  1400 Lipase, blood Within normal limits [JT]  1400 Troponin T, High Sensitivity Without elevation [JT]  1400 Pro Brain natriuretic peptide Within normal limits [JT]  1452 Ct with No evidence of PE, gallstones with gallbladder distention and surrounding stranding concerning for acute cholecystitis.  Will obtain right upper quadrant ultrasound and begin empiric antibiotics. [JT]  1552 US  Abdomen Limited RUQ (LIVER/GB) Cholelithiasis with evidence of cholecystitis [JT]  1606 Discussed patient with general surgery, kelly Munsch PA-C.  Recommended hospitalist admit, hold Plavix .  N.p.o. at midnight. [JT]  1639 Discussed patient with hospitalist Dr. Lue.  Agreed for admission [JT]    Clinical Course User Index [JT] Donnajean Lynwood DEL, PA-C                                 Medical Decision Making Amount and/or Complexity of Data Reviewed Labs: ordered. Decision-making details documented in ED Course. Radiology: ordered. Decision-making details documented in ED Course.  Risk Prescription drug management. Decision regarding hospitalization.   This patient presents to the ED with chief complaint(s) of abdominal pain.  The complaint involves an extensive differential diagnosis and also carries with it a high risk of complications and morbidity.   Pertinent past medical history as listed in HPI  The differential diagnosis includes  Considered ACS however patient has no EKG changes and troponin is without elevation.  CT without any evidence of PE.  No evidence of malignancy on imaging.   Additional history obtained: Additional history obtained from spouse Records reviewed Care Everywhere/External  Records  Disposition:   Patient will be admitted and transferred to [facility] for further surgical evaluation.  Social Determinants of Health:   none  This note was dictated with  voice recognition software.  Despite best efforts at proofreading, errors may have occurred which can change the documentation meaning.       Final diagnoses:  Cholecystitis    ED Discharge Orders     None          Donnajean Lynwood VEAR DEVONNA 03/10/24 1717    Jerrol Lynwood, MD 03/11/24 9077    Jerrol Lynwood, MD 03/22/24 [phone]

## 2024-03-10 NOTE — ED Notes (Signed)
 US  at bedside.

## 2024-03-10 NOTE — Consult Note (Signed)
 Reason for Consult/Chief Complaint: cholecystitis Consultant: Lue, MD  Jerry [name] is an 68 y.o. male.   HPI: 71M presented to [facility] with dyspnea, lightheadedness, and abdominal pain. Localizes abdominal pain to BLQ, L>R. He describes that these were similar symptoms as when he had his stroke previously, so that is why he sought care. He denies any prior abdominal pain, nausea, vomiting. Last BM 9/3 and described as normal. He is followed by Cardiology, Dr. Lozar (?), last seen in May 2025. Last colonoscopy 3y ago. Prior open appy in 1980s.   Past Medical History:  Diagnosis Date   Arthritis    Diabetes mellitus 2008   type II   Dyslipidemia    Erectile dysfunction    History of cardiac catheterization 06/2013   Dr. Peter Swaziland   HTN (hypertension)    Hypogonadism male     Past Surgical History:  Procedure Laterality Date   APPENDECTOMY     BUBBLE STUDY  11/02/2021   Procedure: BUBBLE STUDY;  Surgeon: Alvan Ronal BRAVO, MD;  Location: [facility] ENDOSCOPY;  Service: Cardiovascular;;   COLONOSCOPY  08/2006   Dr. Aneita - repeat 2018   INCISION AND DRAINAGE PERIRECTAL ABSCESS     KNEE ARTHROSCOPY Left 12/28/2014   Procedure: ARTHROSCOPY LEFT KNEE WITH  MEDIAL MENSICUS DEBRIDEMENT;  Surgeon: Dempsey Moan, MD;  Location: [facility] ORS;  Service: Orthopedics;  Laterality: Left;   KNEE SURGERY     left knee - remote past   LEFT HEART CATHETERIZATION WITH CORONARY ANGIOGRAM N/A 06/11/2013   Procedure: LEFT HEART CATHETERIZATION WITH CORONARY ANGIOGRAM;  Surgeon: Peter M Swaziland, MD;  Location: [facility] CATH LAB;  Service: Cardiovascular;  Laterality: N/A;   LOOP RECORDER INSERTION N/A 10/22/2021   Procedure: LOOP RECORDER INSERTION;  Surgeon: Fernande Elspeth BROCKS, MD;  Location: [facility] INVASIVE CV LAB;  Service: Cardiovascular;  Laterality: N/A;   right arm and elbow surgery     TEE WITHOUT CARDIOVERSION N/A 11/02/2021   Procedure: TRANSESOPHAGEAL ECHOCARDIOGRAM (TEE);  Surgeon: Alvan Ronal BRAVO, MD;   Location: [facility] ENDOSCOPY;  Service: Cardiovascular;  Laterality: N/A;   TONSILLECTOMY     TOTAL KNEE ARTHROPLASTY Left 10/04/2015   Procedure: LEFT TOTAL KNEE ARTHROPLASTY right knee aspiration and injection;  Surgeon: Dempsey Moan, MD;  Location: [facility] ORS;  Service: Orthopedics;  Laterality: Left;   TOTAL KNEE ARTHROPLASTY Right 11/04/2016   Procedure: RIGHT TOTAL KNEE ARTHROPLASTY;  Surgeon: Dempsey Moan, MD;  Location: [facility]S;  Service: Orthopedics;  Laterality: Right;  with abductor block   wisdom teeth extractions      Family History  Problem Relation Age of Onset   Diabetes Mother    Other Father        death by MVA   Other Brother        died of MVA   Stroke Neg Hx    Heart disease Neg Hx    Cancer Neg Hx    Hypertension Neg Hx     Social History:  reports that he has been smoking cigarettes. He has never used smokeless tobacco. He reports that he does not drink alcohol and does not use drugs.  Allergies:  Allergies  Allergen Reactions   Diclofenac  Sodium Other (See Comments)    Gel caused a burning sensation    Lisinopril  Cough   Losartan  Cough    Unknown bad side effects Unknown bad side effects    Medications: I have reviewed the patient's current medications.  Results for orders placed or performed during  the hospital encounter of 03/10/24 (from the past 48 hours)  Lipase, blood     Status: None   Collection Time: 03/10/24 12:19 PM  Result Value Ref Range   Lipase 14 11 - 51 U/L    Comment: Performed at [facility], [address] [place], [place] [address]  Comprehensive metabolic panel     Status: Abnormal   Collection Time: 03/10/24 12:19 PM  Result Value Ref Range   Sodium 131 (L) 135 - 145 mmol/L   Potassium 4.1 3.5 - 5.1 mmol/L   Chloride 95 (L) 98 - 111 mmol/L   CO2 21 (L) 22 - 32 mmol/L   Glucose, Bld 131 (H) 70 - 99 mg/dL    Comment: Glucose reference range applies only to samples taken after fasting for at least 8 hours.   BUN  12 8 - 23 mg/dL   Creatinine, Ser 9.01 0.61 - 1.24 mg/dL   Calcium  10.4 (H) 8.9 - 10.3 mg/dL   Total Protein 7.6 6.5 - 8.1 g/dL   Albumin 4.1 3.5 - 5.0 g/dL   AST 31 15 - 41 U/L    Comment: HEMOLYSIS AT THIS LEVEL MAY AFFECT RESULT   ALT 17 0 - 44 U/L   Alkaline Phosphatase 66 38 - 126 U/L   Total Bilirubin 2.3 (H) 0.0 - 1.2 mg/dL   GFR, Estimated >39 >39 mL/min    Comment: (NOTE) Calculated using the CKD-EPI Creatinine Equation (2021)    Anion gap 15 5 - 15    Comment: Performed at [facility], [address], [place], [place] [address]  CBC     Status: Abnormal   Collection Time: 03/10/24 12:19 PM  Result Value Ref Range   WBC 26.2 (H) 4.0 - 10.5 K/uL   RBC 4.45 4.22 - 5.81 MIL/uL   Hemoglobin 16.3 13.0 - 17.0 g/dL   HCT 55.2 60.9 - 47.9 %   MCV 100.4 (H) 80.0 - 100.0 fL   MCH 36.6 (H) 26.0 - 34.0 pg   MCHC 36.5 (H) 30.0 - 36.0 g/dL   RDW 86.8 88.4 - 84.4 %   Platelets 215 150 - 400 K/uL   nRBC 0.0 0.0 - 0.2 %    Comment: Performed at [facility], [address] [place], [place] [address]  Troponin T, High Sensitivity     Status: None   Collection Time: 03/10/24 12:19 PM  Result Value Ref Range   Troponin T High Sensitivity <15 0 - 19 ng/L    Comment: (NOTE) Biotin concentrations > 1000 ng/mL falsely decrease TnT results.  Serial cardiac troponin measurements are suggested.  Refer to the Links section for chest pain algorithms and additional  guidance. Performed at [facility], [address] [place], [place] [address]   Pro Brain natriuretic peptide     Status: None   Collection Time: 03/10/24 12:19 PM  Result Value Ref Range   Pro Brain Natriuretic Peptide 183.0 <300.0 pg/mL    Comment: (NOTE) Age Group        Cut-Points    Interpretation  < 50 years     450 pg/mL       NT-proBNP > 450 pg/mL indicates                                ADHF is likely              50 to 75 years  900 pg/mL       NT-proBNP > 900 pg/mL indicates          ADHF is likely  > 75 years      1800 pg/mL     NT-proBNP > 1800 pg/mL indicates          ADHF is likely                           All ages    Results between       Indeterminate. Further clinical             300 and the cut-   information is needed to determine            point for age group   if ADHF is present.                                                             Elecsys proBNP II/ Elecsys proBNP II STAT           Cut-Point                       Interpretation  300 pg/mL                    NT-proBNP <300pg/mL indicates                             ADHF is not likely  Performed at [facility], [address], [place], [place] [address]   Resp panel by RT-PCR (RSV, Flu A&B, Covid) Anterior Nasal Swab     Status: None   Collection Time: 03/10/24  1:01 PM   Specimen: Anterior Nasal Swab  Result Value Ref Range   SARS Coronavirus 2 by RT PCR NEGATIVE NEGATIVE    Comment: (NOTE) SARS-CoV-2 target nucleic acids are NOT DETECTED.  The SARS-CoV-2 RNA is generally detectable in upper respiratory specimens during the acute phase of infection. The lowest concentration of SARS-CoV-2 viral copies this assay can detect is 138 copies/mL. A negative result does not preclude SARS-Cov-2 infection and should not be used as the sole basis for treatment or other patient management decisions. A negative result may occur with  improper specimen collection/handling, submission of specimen other than nasopharyngeal swab, presence of viral mutation(s) within the areas targeted by this assay, and inadequate number of viral copies(<138 copies/mL). A negative result must be combined with clinical observations, patient history, and epidemiological information. The expected result is Negative.  Fact Sheet for Patients:  BloggerCourse.com  Fact Sheet for Healthcare Providers:   SeriousBroker.it  This test is no t yet approved or cleared by the [place]  FDA and  has been authorized for detection and/or diagnosis of SARS-CoV-2 by FDA under an Emergency Use Authorization (EUA). This EUA will remain  in effect (meaning this test can be used) for the duration of the COVID-19 declaration under Section 564(b)(1) of the Act, 21 U.S.C.section 360bbb-3(b)(1), unless the authorization is terminated  or revoked sooner.       Influenza A by PCR NEGATIVE NEGATIVE   Influenza B by PCR NEGATIVE NEGATIVE    Comment: (NOTE) The Xpert Xpress SARS-CoV-2/FLU/RSV  plus assay is intended as an aid in the diagnosis of influenza from Nasopharyngeal swab specimens and should not be used as a sole basis for treatment. Nasal washings and aspirates are unacceptable for Xpert Xpress SARS-CoV-2/FLU/RSV testing.  Fact Sheet for Patients: BloggerCourse.com  Fact Sheet for Healthcare Providers: SeriousBroker.it  This test is not yet approved or cleared by the [place]  FDA and has been authorized for detection and/or diagnosis of SARS-CoV-2 by FDA under an Emergency Use Authorization (EUA). This EUA will remain in effect (meaning this test can be used) for the duration of the COVID-19 declaration under Section 564(b)(1) of the Act, 21 U.S.C. section 360bbb-3(b)(1), unless the authorization is terminated or revoked.     Resp Syncytial Virus by PCR NEGATIVE NEGATIVE    Comment: (NOTE) Fact Sheet for Patients: BloggerCourse.com  Fact Sheet for Healthcare Providers: SeriousBroker.it  This test is not yet approved or cleared by the [place]  FDA and has been authorized for detection and/or diagnosis of SARS-CoV-2 by FDA under an Emergency Use Authorization (EUA). This EUA will remain in effect (meaning this test can be used) for the duration of  the COVID-19 declaration under Section 564(b)(1) of the Act, 21 U.S.C. section 360bbb-3(b)(1), unless the authorization is terminated or revoked.  Performed at [facility], [address] [place], [place] [address]   Urinalysis, Routine w reflex microscopic -Urine, Clean Catch     Status: Abnormal   Collection Time: 03/10/24  3:00 PM  Result Value Ref Range   Color, Urine BROWN (A) YELLOW    Comment: BIOCHEMICALS MAY BE AFFECTED BY COLOR   APPearance HAZY (A) CLEAR   Specific Gravity, Urine  1.005 - 1.030    TEST NOT REPORTED DUE TO COLOR INTERFERENCE OF URINE PIGMENT   pH  5.0 - 8.0    TEST NOT REPORTED DUE TO COLOR INTERFERENCE OF URINE PIGMENT   Glucose, UA (A) NEGATIVE mg/dL    TEST NOT REPORTED DUE TO COLOR INTERFERENCE OF URINE PIGMENT   Hgb urine dipstick (A) NEGATIVE    TEST NOT REPORTED DUE TO COLOR INTERFERENCE OF URINE PIGMENT   Bilirubin Urine (A) NEGATIVE    TEST NOT REPORTED DUE TO COLOR INTERFERENCE OF URINE PIGMENT   Ketones, ur (A) NEGATIVE mg/dL    TEST NOT REPORTED DUE TO COLOR INTERFERENCE OF URINE PIGMENT   Protein, ur (A) NEGATIVE mg/dL    TEST NOT REPORTED DUE TO COLOR INTERFERENCE OF URINE PIGMENT   Nitrite (A) NEGATIVE    TEST NOT REPORTED DUE TO COLOR INTERFERENCE OF URINE PIGMENT   Leukocytes,Ua (A) NEGATIVE    TEST NOT REPORTED DUE TO COLOR INTERFERENCE OF URINE PIGMENT    Comment: Performed at [facility], [address] [place], [place] [address]  Troponin T, High Sensitivity     Status: None   Collection Time: 03/10/24  3:00 PM  Result Value Ref Range   Troponin T High Sensitivity <15 0 - 19 ng/L    Comment: (NOTE) Biotin concentrations > 1000 ng/mL falsely decrease TnT results.  Serial cardiac troponin measurements are suggested.  Refer to the Links section for chest pain algorithms and additional  guidance. Performed at [facility], [address] [place], [place] [address]    Lactic acid, plasma     Status: None   Collection Time: 03/10/24  3:00 PM  Result Value Ref Range   Lactic Acid, Venous 1.8 0.5 - 1.9 mmol/L    Comment: Performed at [facility], [address], [place], [place] [address]  Urinalysis, Microscopic (reflex)     Status: None   Collection Time: 03/10/24  3:00 PM  Result Value Ref Range   RBC / HPF 11-20 0 - 5 RBC/hpf   WBC, UA 0-5 0 - 5 WBC/hpf   Bacteria, UA NONE SEEN NONE SEEN   Squamous Epithelial / HPF 0-5 0 - 5 /HPF   Mucus PRESENT    Hyaline Casts, UA PRESENT     Comment: Performed at [facility], [address] [place], [place] [address]    US  Abdomen Limited RUQ (LIVER/GB) Result Date: 03/10/2024 CLINICAL DATA:  Abdominal pain. EXAM: ULTRASOUND ABDOMEN LIMITED RIGHT UPPER QUADRANT COMPARISON:  CT scan of same day. FINDINGS: Gallbladder: Cholelithiasis is noted with moderate gallbladder wall thickening measured at 8 mm. No sonographic Murphy's sign is noted, but pericholecystic fluid is noted. Sludge is noted as well. Largest gallstone measures 5 mm. Probable 5 mm gallbladder polyp is noted as well. Common bile duct: Diameter: 5 mm which is within normal limits Liver: No focal lesion identified. Within normal limits in parenchymal echogenicity. Portal vein is patent on color Doppler imaging with normal direction of blood flow towards the liver. Other: None. IMPRESSION: 1. Cholelithiasis is noted with moderate gallbladder wall thickening and pericholecystic fluid. No sonographic Murphy's sign is noted. Findings are concerning for possible cholecystitis. HIDA scan may be performed for further evaluation. 2. Probable 5 mm gallbladder polyp is noted. Electronically Signed   By: Lynwood Landy Raddle M.D.   On: 03/10/2024 15:49   CT ABDOMEN PELVIS W CONTRAST Result Date: 03/10/2024 CLINICAL DATA:  Abdominal pain EXAM: CT ABDOMEN AND PELVIS WITH CONTRAST TECHNIQUE: Multidetector CT imaging of the abdomen and  pelvis was performed using the standard protocol following bolus administration of intravenous contrast. RADIATION DOSE REDUCTION: This exam was performed according to the departmental dose-optimization program which includes automated exposure control, adjustment of the mA and/or kV according to patient size and/or use of iterative reconstruction technique. CONTRAST:  OMNIPAQUE  IOHEXOL  350 MG/ML SOLN COMPARISON:  CT angio chest March 10, 2024 on August 15, 2022 FINDINGS: Lower chest: Right lung base scarring/subsegmental atelectasis. Hepatobiliary: Hypoattenuating liver parenchyma consistent with hepatic steatosis. Focal fat deposition is segment 4 measuring 1.4 cm, typical location. Gallbladder wall thickening and pericholecystic and subhepatic mild fat stranding with associated cholelithiasis suggestive of acute infectious/inflammatory changes. No pericholecystic fluid collection. Pancreas: Mild atrophic changes of the pancreas. No pancreatic ductal dilatation or mass lesion. Spleen: Normal in size without focal abnormality. Adrenals/Urinary Tract: Simple renal cortical cysts in both kidneys which does not require imaging follow-up. Otherwise unremarkable. No hydronephrosis. Adrenal glands are unremarkable. Bladder is thick-walled and under distended. Stomach/Bowel: Stomach is within normal limits. Scattered colonic diverticula. No evidence of bowel wall thickening, distention, or inflammatory changes. Vascular/Lymphatic: No significant vascular findings are present. No enlarged abdominal or pelvic lymph nodes. Reproductive: Prostatomegaly. Other: Tiny fat containing paraumbilical hernia with a defect measuring 7 mm. Bilateral fat containing inguinal hernias left greater than right. Musculoskeletal: Multilevel degenerative changes of the spine. IMPRESSION: Cholelithiasis with CT findings suggestive of acute cholecystitis. Correlate with clinical findings. Hepatic steatosis with focal fat in segment 4.  Additional findings as above. Electronically Signed   By: Megan  Zare M.D.   On: 03/10/2024 14:51   CT Angio Chest PE W and/or Wo Contrast Addendum Date: 03/10/2024 ADDENDUM REPORT: 03/10/2024 14:26 ADDENDUM: Gallstones with gallbladder distension and inflammatory stranding surrounding the gallbladder concerning for acute cholecystitis. This  could be further evaluated with right upper quadrant ultrasound. These results were called by telephone at the time of interpretation on 03/10/2024 at 2:25 pm to provider [facility] , who verbally acknowledged these results. Electronically Signed   By: Franky Crease M.D.   On: 03/10/2024 14:26   Result Date: 03/10/2024 CLINICAL DATA:  Pulmonary embolism (PE) suspected, high prob. Weakness, dizziness, vomiting EXAM: CT ANGIOGRAPHY CHEST WITH CONTRAST TECHNIQUE: Multidetector CT imaging of the chest was performed using the standard protocol during bolus administration of intravenous contrast. Multiplanar CT image reconstructions and MIPs were obtained to evaluate the vascular anatomy. RADIATION DOSE REDUCTION: This exam was performed according to the departmental dose-optimization program which includes automated exposure control, adjustment of the mA and/or kV according to patient size and/or use of iterative reconstruction technique. CONTRAST:  OMNIPAQUE  IOHEXOL  350 MG/ML SOLN COMPARISON:  08/15/2022 FINDINGS: Cardiovascular: Three-vessel coronary artery disease and aortic atherosclerosis. Heart is normal size. Aorta is normal caliber. No filling defects in the pulmonary arteries to suggest pulmonary emboli. Mediastinum/Nodes: No mediastinal, hilar, or axillary adenopathy. Trachea and esophagus are unremarkable. Thyroid  unremarkable. Lungs/Pleura: Linear areas of atelectasis in the lower lobes bilaterally. Nodular area in the lingula is new since prior study and may also reflect atelectasis, measuring 12 mm. This warrants follow-up. No effusions. Upper Abdomen:  Gallbladder is distended with layering gallstones. Surrounding inflammatory change. Findings concerning for acute cholecystitis. Musculoskeletal: Chest wall soft tissues are unremarkable. No acute bony abnormality. Review of the MIP images confirms the above findings. IMPRESSION: No evidence of pulmonary embolus. Three-vessel coronary artery disease. Lower lobe atelectasis. 12 mm nodular density in the lingula may also reflect atelectasis but recommend follow-up CT and 6 months to ensure resolution. Aortic Atherosclerosis (ICD10-I70.0). Electronically Signed: By: Franky Crease M.D. On: 03/10/2024 14:12    ROS 10 point review of systems is negative except as listed above in HPI.   Physical Exam Blood pressure (!) 161/88, pulse (!) 107, temperature 98.5 F (36.9 C), temperature source Oral, resp. rate 18, SpO2 96%. Constitutional: well-developed, well-nourished HEENT: pupils equal, round, reactive to light, 2mm b/l, moist conjunctiva, external inspection of ears and nose normal, hearing intact Oropharynx: normal oropharyngeal mucosa, normal dentition Neck: no thyromegaly, trachea midline, no midline cervical tenderness to palpation Chest: breath sounds equal bilaterally, normal respiratory effort, no midline or lateral chest wall tenderness to palpation/deformity Abdomen: soft, BLQ TTP L>R, no bruising, no hepatosplenomegaly Skin: warm, dry, no rashes Psych: normal memory, normal mood/affect     Assessment/Plan: Acute cholecystitis - WBC 24, started on zosyn . Clinical presentation not overly c/w cholecystitis and is on DAPT for prior h/o stroke. Recommend abx for now and plavix  washout. Reassess clinical exam and consider lap chole this admission. Okay for CLD, ADAT today. Continue abx.     Dreama GEANNIE Hanger, MD General and Trauma Surgery [facility]

## 2024-03-10 NOTE — ED Notes (Signed)
 Pt unable to provide UA. Aware a specimen is needed.

## 2024-03-10 NOTE — ED Triage Notes (Signed)
 Patient states weakness, dizziness, and vomiting since Monday.

## 2024-03-10 NOTE — ED Notes (Signed)
 [facility] at bedside

## 2024-03-10 NOTE — H&P (Signed)
 History and Physical    Patient: Jerry [name] FMW:[id number] DOB: 04-06-1956 DOA: 03/10/2024 DOS: the patient was seen and examined on 03/10/2024 PCP: Lazoff, Shawn P, DO  Patient coming from: Home  Chief Complaint:  Chief Complaint  Patient presents with   Emesis   HPI: Jerry [name] is a 68 y.o. male with medical history significant for DMT2 on insulin , BPH, Htn, and hyperlipidemia presents with     Review of Systems: As mentioned in the history of present illness. All other systems reviewed and are negative. Past Medical History:  Diagnosis Date   Arthritis    Diabetes mellitus 2008   type II   Dyslipidemia    Erectile dysfunction    History of cardiac catheterization 06/2013   Dr. Peter Swaz[place]and   HTN (hypertension)    Hypogonadism male    Past Surgical History:  Procedure Laterality Date   APPENDECTOMY     BUBBLE STUDY  11/02/2021   Procedure: BUBBLE STUDY;  Surgeon: Alvan Ronal BRAVO, MD;  Location: [facility] ENDOSCOPY;  Service: Cardiovascular;;   COLONOSCOPY  08/2006   Dr. Aneita - repeat 2018   INCISION AND DRAINAGE PERIRECTAL ABSCESS     KNEE ARTHROSCOPY Left 12/28/2014   Procedure: ARTHROSCOPY LEFT KNEE WITH  MEDIAL MENSICUS DEBRIDEMENT;  Surgeon: Dempsey Moan, MD;  Location: [facility] ORS;  Service: Orthopedics;  Laterality: Left;   KNEE SURGERY     left knee - remote past   LEFT HEART CATHETERIZATION WITH CORONARY ANGIOGRAM N/A 06/11/2013   Procedure: LEFT HEART CATHETERIZATION WITH CORONARY ANGIOGRAM;  Surgeon: Peter M Swaziland, MD;  Location: [facility] CATH LAB;  Service: Cardiovascular;  Laterality: N/A;   LOOP RECORDER INSERTION N/A 10/22/2021   Procedure: LOOP RECORDER INSERTION;  Surgeon: Fernande Elspeth BROCKS, MD;  Location: [facility] INVASIVE CV LAB;  Service: Cardiovascular;  Laterality: N/A;   right arm and elbow surgery     TEE WITHOUT CARDIOVERSION N/A 11/02/2021   Procedure: TRANSESOPHAGEAL ECHOCARDIOGRAM (TEE);  Surgeon: Alvan Ronal BRAVO, MD;  Location: [facility] ENDOSCOPY;  Service:  Cardiovascular;  Laterality: N/A;   TONSILLECTOMY     TOTAL KNEE ARTHROPLASTY Left 10/04/2015   Procedure: LEFT TOTAL KNEE ARTHROPLASTY right knee aspiration and injection;  Surgeon: Dempsey Moan, MD;  Location: [facility] ORS;  Service: Orthopedics;  Laterality: Left;   TOTAL KNEE ARTHROPLASTY Right 11/04/2016   Procedure: RIGHT TOTAL KNEE ARTHROPLASTY;  Surgeon: Dempsey Moan, MD;  Location: [facility] ORS;  Service: Orthopedics;  Laterality: Right;  with abductor block   wisdom teeth extractions     Social History:  reports that he has been smoking cigarettes. He has never used smokeless tobacco. He reports that he does not drink alcohol and does not use drugs.  Allergies  Allergen Reactions   Diclofenac  Sodium Other (See Comments)    Gel caused a burning sensation    Lisinopril  Cough   Losartan  Cough    Unknown bad side effects Unknown bad side effects    Family History  Problem Relation Age of Onset   Diabetes Mother    Other Father        death by MVA   Other Brother        died of MVA   Stroke Neg Hx    Heart disease Neg Hx    Cancer Neg Hx    Hypertension Neg Hx     Prior to Admission medications   Medication Sig Start Date End Date Taking? Authorizing Provider  acetaminophen  (TYLENOL ) 500 MG tablet Take 1,000 mg  by mouth every 6 (six) hours as needed for mild pain.    [provider]  amLODipine  (NORVASC ) 10 MG tablet Take 10 mg by mouth daily.    [provider]  aspirin  EC 81 MG tablet Take 1 tablet (81 mg total) by mouth daily. Swallow whole. 06/04/22   Alvan Ronal BRAVO, MD  Blood Glucose Monitoring Suppl (GLUCOCOM BLOOD GLUCOSE MONITOR) DEVI Please check blood sugar 5 times per day due to fluctuating blood sugars. 05/31/16   [provider]  clopidogrel  (PLAVIX ) 75 MG tablet Take 1 tablet (75 mg total) by mouth daily. 06/04/22   Alvan Ronal BRAVO, MD  docusate sodium  (COLACE) 100 MG capsule Take 1 capsule (100 mg total) by mouth every 12 (twelve)  hours. Patient taking differently: Take 100 mg by mouth 2 (two) times daily as needed for moderate constipation. 08/13/21   Keith Sor, PA-C  ezetimibe  (ZETIA ) 10 MG tablet Take 1 tablet (10 mg total) by mouth daily. 12/04/21   Alvan Ronal BRAVO, MD  glucose blood test strip 2 times per day 05/31/16   [provider]  HYDROcodone -acetaminophen  (NORCO/VICODIN) 5-325 MG tablet Take 1 tablet by mouth as needed.    [provider]  hydrOXYzine  (VISTARIL ) 25 MG capsule Take 1 capsule (25 mg total) by mouth 3 (three) times daily as needed for anxiety. 08/15/23   McQuilla, Jai B, MD  ibuprofen (ADVIL) 200 MG tablet Take 400 mg by mouth every 6 (six) hours as needed for headache or moderate pain. Patient not taking: Reported on 12/09/2022    [provider]  Insulin  Glargine (LANTUS  SOLOSTAR) 100 UNIT/ML Solostar Pen Inject 15 Units into the skin 2 (two) times daily. Patient taking differently: Inject 15 Units into the skin at bedtime. 12/28/14   Melodi Lerner, MD  ketorolac  (ACULAR ) 0.5 % ophthalmic solution Place 1 drop into the right eye as needed.    [provider]  melatonin 5 MG TABS Take 5 mg by mouth at bedtime as needed (sleep). Patient not taking: Reported on 12/09/2022    [provider]  metFORMIN  (GLUCOPHAGE ) 1000 MG tablet Take 1 tablet (1,000 mg total) by mouth daily. Patient taking differently: Take 1,000 mg by mouth 2 (two) times daily with a meal. 12/28/14   Aluisio, Lerner, MD  MOUNJARO 15 MG/0.5ML Pen Inject 15 mg into the skin once a week.    [provider]  Multiple Vitamins-Minerals (CENTRUM SILVER 50+MEN) TABS Take 1 tablet by mouth daily. Patient not taking: Reported on 12/09/2022    [provider]  rosuvastatin  (CRESTOR ) 40 MG tablet Take 40 mg by mouth daily. 10/23/21   [provider]  sertraline  (ZOLOFT ) 100 MG tablet Take 2 tablets (200 mg total) by mouth daily. 12/03/23   McQuilla, Jai B, MD  Study [facility]  - asundexian 50 mg or placebo tablet (PI-Sethi) Take 1 tablet (50 mg total) by mouth daily. For Investigational Use Only. Take at the same time each day (preferably in the morning). Tablet should be swallowed whole with water; it CANNOT be crushed or broken. Please contact [facility] if you have any questions regarding this medication or study. 10/27/23   Sethi, Pramod S, MD  traZODone  (DESYREL ) 100 MG tablet Take 1 tablet (100 mg total) by mouth at bedtime as needed for sleep. 08/15/23   Juleen Reggie NOVAK, MD    Physical Exam: Vitals:   03/10/24 2015 03/10/24 2059 03/10/24 2100 03/10/24 2139  BP: (!) 147/88  (!) 174/88 ROLLEN)  159/90  Pulse: (!) 109  (!) 101 (!) 110  Resp: 18  18 18   Temp:  98.9 F (37.2 C)  98.4 F (36.9 C)  TempSrc:  Oral  Oral  SpO2: 94%  96% 98%   *** Data Reviewed: {Tip this will not be part of the note when signed- Document your independent interpretation of telemetry tracing, EKG, lab, Radiology test or any other diagnostic tests. Add any new diagnostic test ordered today. (Optional):[id number]} Results for orders placed or performed during the hospital encounter of 03/10/24 (from the past 24 hours)  Lipase, blood     Status: None   Collection Time: 03/10/24 12:19 PM  Result Value Ref Range   Lipase 14 11 - 51 U/L  Comprehensive metabolic panel     Status: Abnormal   Collection Time: 03/10/24 12:19 PM  Result Value Ref Range   Sodium 131 (L) 135 - 145 mmol/L   Potassium 4.1 3.5 - 5.1 mmol/L   Chloride 95 (L) 98 - 111 mmol/L   CO2 21 (L) 22 - 32 mmol/L   Glucose, Bld 131 (H) 70 - 99 mg/dL   BUN 12 8 - 23 mg/dL   Creatinine, Ser 9.01 0.61 - 1.24 mg/dL   Calcium  10.4 (H) 8.9 - 10.3 mg/dL   Total Protein 7.6 6.5 - 8.1 g/dL   Albumin 4.1 3.5 - 5.0 g/dL   AST 31 15 - 41 U/L   ALT 17 0 - 44 U/L   Alkaline Phosphatase 66 38 - 126 U/L   Total Bilirubin 2.3 (H) 0.0 - 1.2 mg/dL   GFR, Estimated >39 >39 mL/min   Anion gap 15 5 - 15  CBC     Status: Abnormal    Collection Time: 03/10/24 12:19 PM  Result Value Ref Range   WBC 26.2 (H) 4.0 - 10.5 K/uL   RBC 4.45 4.22 - 5.81 MIL/uL   Hemoglobin 16.3 13.0 - 17.0 g/dL   HCT 55.2 60.9 - 47.9 %   MCV 100.4 (H) 80.0 - 100.0 fL   MCH 36.6 (H) 26.0 - 34.0 pg   MCHC 36.5 (H) 30.0 - 36.0 g/dL   RDW 86.8 88.4 - 84.4 %   Platelets 215 150 - 400 K/uL   nRBC 0.0 0.0 - 0.2 %  Troponin T, High Sensitivity     Status: None   Collection Time: 03/10/24 12:19 PM  Result Value Ref Range   Troponin T High Sensitivity <15 0 - 19 ng/L  Pro Brain natriuretic peptide     Status: None   Collection Time: 03/10/24 12:19 PM  Result Value Ref Range   Pro Brain Natriuretic Peptide 183.0 <300.0 pg/mL  Resp panel by RT-PCR (RSV, Flu A&B, Covid) Anterior Nasal Swab     Status: None   Collection Time: 03/10/24  1:01 PM   Specimen: Anterior Nasal Swab  Result Value Ref Range   SARS Coronavirus 2 by RT PCR NEGATIVE NEGATIVE   Influenza A by PCR NEGATIVE NEGATIVE   Influenza B by PCR NEGATIVE NEGATIVE   Resp Syncytial Virus by PCR NEGATIVE NEGATIVE  Urinalysis, Routine w reflex microscopic -Urine, Clean Catch     Status: Abnormal   Collection Time: 03/10/24  3:00 PM  Result Value Ref Range   Color, Urine BROWN (A) YELLOW   APPearance HAZY (A) CLEAR   Specific Gravity, Urine  1.005 - 1.030    TEST NOT REPORTED DUE TO COLOR INTERFERENCE OF URINE PIGMENT   pH  5.0 - 8.0  TEST NOT REPORTED DUE TO COLOR INTERFERENCE OF URINE PIGMENT   Glucose, UA (A) NEGATIVE mg/dL    TEST NOT REPORTED DUE TO COLOR INTERFERENCE OF URINE PIGMENT   Hgb urine dipstick (A) NEGATIVE    TEST NOT REPORTED DUE TO COLOR INTERFERENCE OF URINE PIGMENT   Bilirubin Urine (A) NEGATIVE    TEST NOT REPORTED DUE TO COLOR INTERFERENCE OF URINE PIGMENT   Ketones, ur (A) NEGATIVE mg/dL    TEST NOT REPORTED DUE TO COLOR INTERFERENCE OF URINE PIGMENT   Protein, ur (A) NEGATIVE mg/dL    TEST NOT REPORTED DUE TO COLOR INTERFERENCE OF URINE PIGMENT   Nitrite  (A) NEGATIVE    TEST NOT REPORTED DUE TO COLOR INTERFERENCE OF URINE PIGMENT   Leukocytes,Ua (A) NEGATIVE    TEST NOT REPORTED DUE TO COLOR INTERFERENCE OF URINE PIGMENT  Troponin T, High Sensitivity     Status: None   Collection Time: 03/10/24  3:00 PM  Result Value Ref Range   Troponin T High Sensitivity <15 0 - 19 ng/L  Lactic acid, plasma     Status: None   Collection Time: 03/10/24  3:00 PM  Result Value Ref Range   Lactic Acid, Venous 1.8 0.5 - 1.9 mmol/L  Urinalysis, Microscopic (reflex)     Status: None   Collection Time: 03/10/24  3:00 PM  Result Value Ref Range   RBC / HPF 11-20 0 - 5 RBC/hpf   WBC, UA 0-5 0 - 5 WBC/hpf   Bacteria, UA NONE SEEN NONE SEEN   Squamous Epithelial / HPF 0-5 0 - 5 /HPF   Mucus PRESENT    Hyaline Casts, UA PRESENT   Glucose, capillary     Status: Abnormal   Collection Time: 03/10/24  9:48 PM  Result Value Ref Range   Glucose-Capillary 121 (H) 70 - 99 mg/dL     Assessment and Plan: No notes have been filed under this hospital service. Service: Hospitalist     Advance Care Planning:   Code Status: Full Code ***  Consults: general surgery  Family Communication: ***  Severity of Illness: The appropriate patient status for this patient is INPATIENT. Inpatient status is judged to be reasonable and necessary in order to provide the required intensity of service to ensure the patient's safety. The patient's presenting symptoms, physical exam findings, and initial radiographic and laboratory data in the context of their chronic comorbidities is felt to place them at high risk for further clinical deterioration. Furthermore, it is not anticipated that the patient will be medically stable for discharge from the hospital within 2 midnights of admission.   * I certify that at the point of admission it is my clinical judgment that the patient will require inpatient hospital care spanning beyond 2 midnights from the point of admission due to high  intensity of service, high risk for further deterioration and high frequency of surveillance required.*  Author: ARTHEA CHILD, MD 03/10/2024 10:55 PM  For on call review [email or web address].

## 2024-03-11 ENCOUNTER — Encounter (HOSPITAL_COMMUNITY): Payer: Self-pay | Admitting: Internal Medicine

## 2024-03-11 DIAGNOSIS — K810 Acute cholecystitis: Secondary | ICD-10-CM | POA: Diagnosis not present

## 2024-03-11 LAB — CBC
HCT: 40.1 % (ref 39.0–52.0)
Hemoglobin: 14.5 g/dL (ref 13.0–17.0)
MCH: 36.4 pg — ABNORMAL HIGH (ref 26.0–34.0)
MCHC: 36.2 g/dL — ABNORMAL HIGH (ref 30.0–36.0)
MCV: 100.8 fL — ABNORMAL HIGH (ref 80.0–100.0)
Platelets: 182 K/uL (ref 150–400)
RBC: 3.98 MIL/uL — ABNORMAL LOW (ref 4.22–5.81)
RDW: 13 % (ref 11.5–15.5)
WBC: 24.9 K/uL — ABNORMAL HIGH (ref 4.0–10.5)
nRBC: 0 % (ref 0.0–0.2)

## 2024-03-11 LAB — COMPREHENSIVE METABOLIC PANEL WITH GFR
ALT: 20 U/L (ref 0–44)
AST: 29 U/L (ref 15–41)
Albumin: 2.7 g/dL — ABNORMAL LOW (ref 3.5–5.0)
Alkaline Phosphatase: 68 U/L (ref 38–126)
Anion gap: 12 (ref 5–15)
BUN: 7 mg/dL — ABNORMAL LOW (ref 8–23)
CO2: 22 mmol/L (ref 22–32)
Calcium: 8.7 mg/dL — ABNORMAL LOW (ref 8.9–10.3)
Chloride: 98 mmol/L (ref 98–111)
Creatinine, Ser: 0.83 mg/dL (ref 0.61–1.24)
GFR, Estimated: >60 mL/min (ref >60)
Glucose, Bld: 94 mg/dL (ref 70–99)
Potassium: 3.6 mmol/L (ref 3.5–5.1)
Sodium: 132 mmol/L — ABNORMAL LOW (ref 135–145)
Total Bilirubin: 2.2 mg/dL — ABNORMAL HIGH (ref 0.0–1.2)
Total Protein: 6.1 g/dL — ABNORMAL LOW (ref 6.5–8.1)

## 2024-03-11 LAB — GLUCOSE, CAPILLARY
Glucose-Capillary: 99 mg/dL (ref 70–99)
Glucose-Capillary: 127 mg/dL — ABNORMAL HIGH (ref 70–99)
Glucose-Capillary: 115 mg/dL — ABNORMAL HIGH (ref 70–99)
Glucose-Capillary: 115 mg/dL — ABNORMAL HIGH (ref 70–99)

## 2024-03-11 LAB — HIV ANTIBODY (ROUTINE TESTING W REFLEX): HIV Screen 4th Generation wRfx: NONREACTIVE

## 2024-03-11 LAB — HEMOGLOBIN A1C
Hgb A1c MFr Bld: 3.6 % — ABNORMAL LOW (ref 4.8–5.6)
Mean Plasma Glucose: 56.62 mg/dL

## 2024-03-11 MED ORDER — ENOXAPARIN SODIUM 40 MG/0.4ML IJ SOSY
40.0000 mg | PREFILLED_SYRINGE | INTRAMUSCULAR | Status: DC
  Administered 2024-03-11 – 2024-03-13 (×3): 40 mg via SUBCUTANEOUS
  Filled 2024-03-11 (×3): qty 0.4

## 2024-03-11 MED ORDER — EZETIMIBE 10 MG PO TABS
10.0000 mg | ORAL_TABLET | Freq: Every day | ORAL | Status: DC
  Administered 2024-03-11 – 2024-03-16 (×5): 10 mg via ORAL
  Filled 2024-03-11 (×6): qty 1

## 2024-03-11 MED ORDER — PIPERACILLIN-TAZOBACTAM 3.375 G IVPB
3.3750 g | Freq: Three times a day (TID) | INTRAVENOUS | Status: DC
  Administered 2024-03-11 – 2024-03-12 (×7): 3.375 g via INTRAVENOUS
  Filled 2024-03-11 (×8): qty 50

## 2024-03-11 NOTE — Progress Notes (Signed)
 Acute cholecystitis   03/11/24 0815  TOC Brief Assessment  Insurance and Status Reviewed  Patient has primary care physician Yes  Home environment has been reviewed From home with spouse.  Prior level of function: PTA independent with ADL'S, no DME USAGE  Prior/Current Home Services No current home services  Social Drivers of Health Review SDOH reviewed no interventions necessary  Readmission risk has been reviewed No  Transition of care needs no transition of care needs at this time   Per MD recommending  abx for now and plavix  washout. Reassess clinical exam and consider lap chole this admission.  IP CM following and will assist with needs as presented. Jon Hoit RN,BSN,CM [phone]

## 2024-03-11 NOTE — H&P (Incomplete)
 History and Physical    Patient: Jerry [name] FMW:[id number] DOB: 09/19/1955 DOA: 03/10/2024 DOS: the patient was seen and examined on 03/10/2024 PCP: Lazoff, Shawn P, DO  Patient coming from: Home  Chief Complaint:  Chief Complaint  Patient presents with  . Emesis   HPI: Jerry [name] is a 68 y.o. male with medical history significant for DMT2 on insulin , BPH, Htn, and hyperlipidemia presents with     Review of Systems: As mentioned in the history of present illness. All other systems reviewed and are negative. Past Medical History:  Diagnosis Date  . Arthritis   . Diabetes mellitus 2008   type II  . Dyslipidemia   . Erectile dysfunction   . History of cardiac catheterization 06/2013   Dr. Peter Swaziland  . HTN (hypertension)   . Hypogonadism male    Past Surgical History:  Procedure Laterality Date  . APPENDECTOMY    . BUBBLE STUDY  11/02/2021   Procedure: BUBBLE STUDY;  Surgeon: Alvan Ronal BRAVO, MD;  Location: [facility] ENDOSCOPY;  Service: Cardiovascular;;  . COLONOSCOPY  08/2006   Dr. Aneita - repeat 2018  . INCISION AND DRAINAGE PERIRECTAL ABSCESS    . KNEE ARTHROSCOPY Left 12/28/2014   Procedure: ARTHROSCOPY LEFT KNEE WITH  MEDIAL MENSICUS DEBRIDEMENT;  Surgeon: Dempsey Moan, MD;  Location: [facility] ORS;  Service: Orthopedics;  Laterality: Left;  . KNEE SURGERY     left knee - remote past  . LEFT HEART CATHETERIZATION WITH CORONARY ANGIOGRAM N/A 06/11/2013   Procedure: LEFT HEART CATHETERIZATION WITH CORONARY ANGIOGRAM;  Surgeon: Peter M Swaziland, MD;  Location: [facility] CATH LAB;  Service: Cardiovascular;  Laterality: N/A;  . LOOP RECORDER INSERTION N/A 10/22/2021   Procedure: LOOP RECORDER INSERTION;  Surgeon: Fernande Elspeth BROCKS, MD;  Location: [facility] INVASIVE CV LAB;  Service: Cardiovascular;  Laterality: N/A;  . right arm and elbow surgery    . TEE WITHOUT CARDIOVERSION N/A 11/02/2021   Procedure: TRANSESOPHAGEAL ECHOCARDIOGRAM (TEE);  Surgeon: Alvan Ronal BRAVO, MD;  Location: [facility] ENDOSCOPY;   Service: Cardiovascular;  Laterality: N/A;  . TONSILLECTOMY    . TOTAL KNEE ARTHROPLASTY Left 10/04/2015   Procedure: LEFT TOTAL KNEE ARTHROPLASTY right knee aspiration and injection;  Surgeon: Dempsey Moan, MD;  Location: [facility] ORS;  Service: Orthopedics;  Laterality: Left;  . TOTAL KNEE ARTHROPLASTY Right 11/04/2016   Procedure: RIGHT TOTAL KNEE ARTHROPLASTY;  Surgeon: Dempsey Moan, MD;  Location: [facility] ORS;  Service: Orthopedics;  Laterality: Right;  with abductor block  . wisdom teeth extractions     Social History:  reports that he has been smoking cigarettes. He has never used smokeless tobacco. He reports that he does not drink alcohol and does not use drugs.  Allergies  Allergen Reactions  . Diclofenac  Sodium Other (See Comments)    Gel caused a burning sensation   . Lisinopril  Cough  . Losartan  Cough    Unknown bad side effects Unknown bad side effects    Family History  Problem Relation Age of Onset  . Diabetes Mother   . Other Father        death by MVA  . Other Brother        died of MVA  . Stroke Neg Hx   . Heart disease Neg Hx   . Cancer Neg Hx   . Hypertension Neg Hx     Prior to Admission medications   Medication Sig Start Date End Date Taking? Authorizing Provider  acetaminophen  (TYLENOL ) 500 MG tablet Take 1,000 mg  by mouth every 6 (six) hours as needed for mild pain.    [provider]  amLODipine  (NORVASC ) 10 MG tablet Take 10 mg by mouth daily.    [provider]  aspirin  EC 81 MG tablet Take 1 tablet (81 mg total) by mouth daily. Swallow whole. 06/04/22   Alvan Ronal BRAVO, MD  Blood Glucose Monitoring Suppl (GLUCOCOM BLOOD GLUCOSE MONITOR) DEVI Please check blood sugar 5 times per day due to fluctuating blood sugars. 05/31/16   [provider]  clopidogrel  (PLAVIX ) 75 MG tablet Take 1 tablet (75 mg total) by mouth daily. 06/04/22   Alvan Ronal BRAVO, MD  docusate sodium  (COLACE) 100 MG capsule Take 1 capsule (100 mg total) by mouth every  12 (twelve) hours. Patient taking differently: Take 100 mg by mouth 2 (two) times daily as needed for moderate constipation. 08/13/21   Keith Sor, PA-C  ezetimibe  (ZETIA ) 10 MG tablet Take 1 tablet (10 mg total) by mouth daily. 12/04/21   Alvan Ronal BRAVO, MD  glucose blood test strip 2 times per day 05/31/16   [provider]  HYDROcodone -acetaminophen  (NORCO/VICODIN) 5-325 MG tablet Take 1 tablet by mouth as needed.    [provider]  hydrOXYzine  (VISTARIL ) 25 MG capsule Take 1 capsule (25 mg total) by mouth 3 (three) times daily as needed for anxiety. 08/15/23   McQuilla, Jai B, MD  ibuprofen (ADVIL) 200 MG tablet Take 400 mg by mouth every 6 (six) hours as needed for headache or moderate pain. Patient not taking: Reported on 12/09/2022    [provider]  Insulin  Glargine (LANTUS  SOLOSTAR) 100 UNIT/ML Solostar Pen Inject 15 Units into the skin 2 (two) times daily. Patient taking differently: Inject 15 Units into the skin at bedtime. 12/28/14   Melodi Lerner, MD  ketorolac  (ACULAR ) 0.5 % ophthalmic solution Place 1 drop into the right eye as needed.    [provider]  melatonin 5 MG TABS Take 5 mg by mouth at bedtime as needed (sleep). Patient not taking: Reported on 12/09/2022    [provider]  metFORMIN  (GLUCOPHAGE ) 1000 MG tablet Take 1 tablet (1,000 mg total) by mouth daily. Patient taking differently: Take 1,000 mg by mouth 2 (two) times daily with a meal. 12/28/14   Aluisio, Lerner, MD  MOUNJARO 15 MG/0.5ML Pen Inject 15 mg into the skin once a week.    [provider]  Multiple Vitamins-Minerals (CENTRUM SILVER 50+MEN) TABS Take 1 tablet by mouth daily. Patient not taking: Reported on 12/09/2022    [provider]  rosuvastatin  (CRESTOR ) 40 MG tablet Take 40 mg by mouth daily. 10/23/21   [provider]  sertraline  (ZOLOFT ) 100 MG tablet Take 2 tablets (200 mg total) by mouth daily. 12/03/23   McQuilla, Jai B, MD  Study [facility] - asundexian 50 mg or placebo tablet (PI-Sethi) Take 1 tablet (50 mg total) by mouth daily. For Investigational Use Only. Take at the same time each day (preferably in the morning). Tablet should be swallowed whole with water; it CANNOT be crushed or broken. Please contact [facility] if you have any questions regarding this medication or study. 10/27/23   Sethi, Pramod S, MD  traZODone  (DESYREL ) 100 MG tablet Take 1 tablet (100 mg total) by mouth at bedtime as needed for sleep. 08/15/23   Juleen Reggie NOVAK, MD    Physical Exam: Vitals:   03/10/24 2015 03/10/24 2059 03/10/24 2100 03/10/24 2139  BP: (!) 147/88  (!) 174/88 ROLLEN)  159/90  Pulse: (!) 109  (!) 101 (!) 110  Resp: 18  18 18   Temp:  98.9 F (37.2 C)  98.4 F (36.9 C)  TempSrc:  Oral  Oral  SpO2: 94%  96% 98%   Physical Exam:  General: No acute distress, well developed, well nourished HEENT: Normocephalic, atraumatic, PERRL Cardiovascular: Normal rate and rhythm. Distal pulses intact. Pulmonary: Normal pulmonary effort, normal breath sounds Gastrointestinal: Nondistended abdomen, soft, RUQ tender, normoactive bowel sounds Musculoskeletal:Normal ROM, no lower ext edema Lymphadenopathy: No cervical LAD. Skin: Skin is warm and dry. Neuro: No focal deficits noted, AAOx3. PSYCH: Attentive and cooperative  Data Reviewed: {Tip this will not be part of the note when signed- Document your independent interpretation of telemetry tracing, EKG, lab, Radiology test or any other diagnostic tests. Add any new diagnostic test ordered today. (Optional):[id number]} Results for orders placed or performed during the hospital encounter of 03/10/24 (from the past 24 hours)  Lipase, blood     Status: None   Collection Time: 03/10/24 12:19 PM  Result Value Ref Range   Lipase 14 11 - 51 U/L  Comprehensive metabolic panel     Status: Abnormal   Collection Time: 03/10/24 12:19 PM  Result Value Ref Range   Sodium 131 (L) 135 -  145 mmol/L   Potassium 4.1 3.5 - 5.1 mmol/L   Chloride 95 (L) 98 - 111 mmol/L   CO2 21 (L) 22 - 32 mmol/L   Glucose, Bld 131 (H) 70 - 99 mg/dL   BUN 12 8 - 23 mg/dL   Creatinine, Ser 9.01 0.61 - 1.24 mg/dL   Calcium  10.4 (H) 8.9 - 10.3 mg/dL   Total Protein 7.6 6.5 - 8.1 g/dL   Albumin 4.1 3.5 - 5.0 g/dL   AST 31 15 - 41 U/L   ALT 17 0 - 44 U/L   Alkaline Phosphatase 66 38 - 126 U/L   Total Bilirubin 2.3 (H) 0.0 - 1.2 mg/dL   GFR, Estimated >39 >39 mL/min   Anion gap 15 5 - 15  CBC     Status: Abnormal   Collection Time: 03/10/24 12:19 PM  Result Value Ref Range   WBC 26.2 (H) 4.0 - 10.5 K/uL   RBC 4.45 4.22 - 5.81 MIL/uL   Hemoglobin 16.3 13.0 - 17.0 g/dL   HCT 55.2 60.9 - 47.9 %   MCV 100.4 (H) 80.0 - 100.0 fL   MCH 36.6 (H) 26.0 - 34.0 pg   MCHC 36.5 (H) 30.0 - 36.0 g/dL   RDW 86.8 88.4 - 84.4 %   Platelets 215 150 - 400 K/uL   nRBC 0.0 0.0 - 0.2 %  Troponin T, High Sensitivity     Status: None   Collection Time: 03/10/24 12:19 PM  Result Value Ref Range   Troponin T High Sensitivity <15 0 - 19 ng/L  Pro Brain natriuretic peptide     Status: None   Collection Time: 03/10/24 12:19 PM  Result Value Ref Range   Pro Brain Natriuretic Peptide 183.0 <300.0 pg/mL  Resp panel by RT-PCR (RSV, Flu A&B, Covid) Anterior Nasal Swab     Status: None   Collection Time: 03/10/24  1:01 PM   Specimen: Anterior Nasal Swab  Result Value Ref Range   SARS Coronavirus 2 by RT PCR NEGATIVE NEGATIVE   Influenza A by PCR NEGATIVE NEGATIVE   Influenza B by PCR NEGATIVE NEGATIVE   Resp Syncytial Virus by PCR NEGATIVE NEGATIVE  Urinalysis, Routine w reflex microscopic -  Urine, Clean Catch     Status: Abnormal   Collection Time: 03/10/24  3:00 PM  Result Value Ref Range   Color, Urine BROWN (A) YELLOW   APPearance HAZY (A) CLEAR   Specific Gravity, Urine  1.005 - 1.030    TEST NOT REPORTED DUE TO COLOR INTERFERENCE OF URINE PIGMENT   pH  5.0 - 8.0    TEST NOT REPORTED DUE TO COLOR  INTERFERENCE OF URINE PIGMENT   Glucose, UA (A) NEGATIVE mg/dL    TEST NOT REPORTED DUE TO COLOR INTERFERENCE OF URINE PIGMENT   Hgb urine dipstick (A) NEGATIVE    TEST NOT REPORTED DUE TO COLOR INTERFERENCE OF URINE PIGMENT   Bilirubin Urine (A) NEGATIVE    TEST NOT REPORTED DUE TO COLOR INTERFERENCE OF URINE PIGMENT   Ketones, ur (A) NEGATIVE mg/dL    TEST NOT REPORTED DUE TO COLOR INTERFERENCE OF URINE PIGMENT   Protein, ur (A) NEGATIVE mg/dL    TEST NOT REPORTED DUE TO COLOR INTERFERENCE OF URINE PIGMENT   Nitrite (A) NEGATIVE    TEST NOT REPORTED DUE TO COLOR INTERFERENCE OF URINE PIGMENT   Leukocytes,Ua (A) NEGATIVE    TEST NOT REPORTED DUE TO COLOR INTERFERENCE OF URINE PIGMENT  Troponin T, High Sensitivity     Status: None   Collection Time: 03/10/24  3:00 PM  Result Value Ref Range   Troponin T High Sensitivity <15 0 - 19 ng/L  Lactic acid, plasma     Status: None   Collection Time: 03/10/24  3:00 PM  Result Value Ref Range   Lactic Acid, Venous 1.8 0.5 - 1.9 mmol/L  Urinalysis, Microscopic (reflex)     Status: None   Collection Time: 03/10/24  3:00 PM  Result Value Ref Range   RBC / HPF 11-20 0 - 5 RBC/hpf   WBC, UA 0-5 0 - 5 WBC/hpf   Bacteria, UA NONE SEEN NONE SEEN   Squamous Epithelial / HPF 0-5 0 - 5 /HPF   Mucus PRESENT    Hyaline Casts, UA PRESENT   Glucose, capillary     Status: Abnormal   Collection Time: 03/10/24  9:48 PM  Result Value Ref Range   Glucose-Capillary 121 (H) 70 - 99 mg/dL     Assessment and Plan: Acute cholecystitis  - General surgery consulted - NPO, IV fluids, Pain control -      Advance Care Planning:   Code Status: Full Code The patient neames his wife as his surrogate decision maker and he wants to be full code.  Consults: general surgery  Family Communication: none  Severity of Illness: The appropriate patient status for this patient is INPATIENT. Inpatient status is judged to be reasonable and necessary in order to  provide the required intensity of service to ensure the patient's safety. The patient's presenting symptoms, physical exam findings, and initial radiographic and laboratory data in the context of their chronic comorbidities is felt to place them at high risk for further clinical deterioration. Furthermore, it is not anticipated that the patient will be medically stable for discharge from the hospital within 2 midnights of admission.   * I certify that at the point of admission it is my clinical judgment that the patient will require inpatient hospital care spanning beyond 2 midnights from the point of admission due to high intensity of service, high risk for further deterioration and high frequency of surveillance required.*  Author: ARTHEA CHILD, MD 03/10/2024 10:55 PM  For on call review [email or web address].

## 2024-03-11 NOTE — Hospital Course (Addendum)
 Patient with PMH of HTN, type II DM, HLD, cryptogenic stroke, ILR in situ. Presented the hospital with complaints of dizziness and fatigue. He also had episodes of vomiting on a daily basis for last 3 days. Workup in the ED shows evidence of possible cholecystitis. General surgery consulted. Underwent lap chole on 9/7.  Assessment and Plan: Acute calculous cholecystitis. Status post lap cholecystectomy 9/7. CT abdomen shows evidence of gallbladder wall thickening with pericholecystic stranding. Receiving IV antibiotics. Underwent surgery. Has a JP drain.  Monitoring for improvement in JP drain output.  On clear liquid diet due to episodes of vomiting. Monitor postop recovery.  History of cryptogenic stroke. Patient is on aspirin  81 mg, Plavix  75 mg as well as [employer] medication asundexian.  Currently on medications are on hold. Last dose of this medication was on Wednesday morning. Recommendation from neurology is to hold asundexian for 48 to 72 hours before performing surgery. Last ILR evaluation did not show any evidence of arrhythmia or pause. Resuming aspirin  and Plavix  for now.  Family did not bring research medication.  Currently on Lovenox  for DVT prophy X.  HTN. Blood pressure stable. On Norvasc .  For now continue.  Type 2 diabetes mellitus, controlled with long-term insulin  use with HLD and stroke Recent hemoglobin A1c 3.6. Currently on sliding scale insulin . For now monitor.  Weight loss. Patient and the family reports that he has lost 70 pounds in last 1 year. Discussed that this could be just an effect of Mounjaro. CT chest abdomen and pelvis negative for any occult malignancy for now. Monitor clinically outpatient.  HLD. On Zetia  and statin. For now on hold.  Hyponatremia. clinically nonsignificant.  Macrocytosis. For now we will monitor.

## 2024-03-11 NOTE — Progress Notes (Signed)
 [facility] Progress Note Patient: Jerry [name] FMW:[id number] DOB: [birth year]  DOA: 03/10/2024 DOS: the patient was seen and examined on 03/11/2024  Brief Hospital Course: Patient with PMH of HTN, type II DM, HLD, cryptogenic stroke, ILR in situ. Presented the hospital with complaints of dizziness and fatigue. He also had episodes of vomiting on a daily basis for last 3 days. Workup in the ED shows evidence of possible cholecystitis. General surgery consulted. Currently being monitored clinically with IV antibiotics and also awaiting for Plavix /asundexian washout.  Assessment and Plan: Acute cholecystitis. Calculus. CT abdomen shows evidence of gallbladder wall thickening with [name] cholecystic stranding. So far no evidence of choledocholithiasis. Receiving IV antibiotics. Has leukocytosis. On clear liquid diet. Surgery monitoring clinically.  History of cryptogenic stroke. Patient is on aspirin  81 mg, Plavix  75 mg as well as [employer] medication asundexian.  Currently on medications are on hold. Last dose of this medication was on Wednesday morning. Recommendation from neurology is to hold asundexian for 48 to 78 hours before performing surgery. Last ILR evaluation did not show any evidence of arrhythmia or pause.  HTN. Blood pressure stable. On Norvasc .  For now continue.  Type 2 diabetes mellitus, controlled with long-term insulin  use with HLD and stroke Recent hemoglobin A1c 3.6. Currently on sliding scale insulin . For now monitor.  Weight loss. Patient and the family reports that he has lost 70 pounds in last 1 year. Discussed that this could be just an effect of Mounjaro. CT chest abdomen and pelvis negative for any occult malignancy for now. Monitor clinically outpatient.  HLD. On Zetia  and statin. For now on hold.  Hyponatremia. Sodium 132, clinically nonsignificant.  Macrocytosis. For now we will monitor.   Subjective: Abdominal pain still  present but improving.  No nausea no vomiting.  No fever no chills.  Passing gas.  Physical Exam: Clear to auscultation. Bowel sound present.  Abdominal tenderness in the central abdomen area. S1-S2 present. No edema.  Data Reviewed: I have Reviewed nursing notes, Vitals, and Lab results. Since last encounter, pertinent lab results CBC and BMP   . I have ordered test including CBC and CMP  . I have discussed pt's care plan and test results with general surgery and neurology  .  Disposition: Status is: Inpatient Remains inpatient appropriate because: Improvement in cholecystitis  enoxaparin  (LOVENOX ) injection 40 mg Start: 03/11/24 1400 SCDs Start: 03/10/24 2200   Family Communication: Discussed with wife on the phone Level of care: Med-Surg   Vitals:   03/10/24 2139 03/11/24 0000 03/11/24 0437 03/11/24 0757  BP: (!) 159/90  135/74 136/78  Pulse: (!) 110  100 95  Resp: 18  13 17   Temp: 98.4 F (36.9 C) 98.3 F (36.8 C) 98.6 F (37 C) 98 F (36.7 C)  TempSrc: Oral Oral    SpO2: 98%  96% 96%  Weight: 103.3 kg     Height: 6' 4 (1.93 m)        Author: Yetta Blanch, MD 03/11/2024 6:09 PM  Please look on [email or web address] to find out who is on call.

## 2024-03-12 DIAGNOSIS — K810 Acute cholecystitis: Secondary | ICD-10-CM | POA: Diagnosis not present

## 2024-03-12 LAB — CBC WITH DIFFERENTIAL/PLATELET
Abs Immature Granulocytes: 0.11 K/uL — ABNORMAL HIGH (ref 0.00–0.07)
Basophils Absolute: 0 K/uL (ref 0.0–0.1)
Basophils Relative: 0 %
Eosinophils Absolute: 0.1 K/uL (ref 0.0–0.5)
Eosinophils Relative: 0 %
HCT: 37.9 % — ABNORMAL LOW (ref 39.0–52.0)
Hemoglobin: 13.8 g/dL (ref 13.0–17.0)
Immature Granulocytes: 1 %
Lymphocytes Relative: 11 %
Lymphs Abs: 1.7 K/uL (ref 0.7–4.0)
MCH: 36.4 pg — ABNORMAL HIGH (ref 26.0–34.0)
MCHC: 36.4 g/dL — ABNORMAL HIGH (ref 30.0–36.0)
MCV: 100 fL (ref 80.0–100.0)
Monocytes Absolute: 1.2 K/uL — ABNORMAL HIGH (ref 0.1–1.0)
Monocytes Relative: 8 %
Neutro Abs: 12.5 K/uL — ABNORMAL HIGH (ref 1.7–7.7)
Neutrophils Relative %: 80 %
Platelets: 202 K/uL (ref 150–400)
RBC: 3.79 MIL/uL — ABNORMAL LOW (ref 4.22–5.81)
RDW: 12.5 % (ref 11.5–15.5)
WBC: 15.6 K/uL — ABNORMAL HIGH (ref 4.0–10.5)
nRBC: 0 % (ref 0.0–0.2)

## 2024-03-12 LAB — COMPREHENSIVE METABOLIC PANEL WITH GFR
ALT: 57 U/L — ABNORMAL HIGH (ref 0–44)
AST: 96 U/L — ABNORMAL HIGH (ref 15–41)
Albumin: 2.6 g/dL — ABNORMAL LOW (ref 3.5–5.0)
Alkaline Phosphatase: 82 U/L (ref 38–126)
Anion gap: 8 (ref 5–15)
BUN: 6 mg/dL — ABNORMAL LOW (ref 8–23)
CO2: 25 mmol/L (ref 22–32)
Calcium: 8.7 mg/dL — ABNORMAL LOW (ref 8.9–10.3)
Chloride: 99 mmol/L (ref 98–111)
Creatinine, Ser: 0.83 mg/dL (ref 0.61–1.24)
GFR, Estimated: >60 mL/min (ref >60)
Glucose, Bld: 91 mg/dL (ref 70–99)
Potassium: 3.2 mmol/L — ABNORMAL LOW (ref 3.5–5.1)
Sodium: 132 mmol/L — ABNORMAL LOW (ref 135–145)
Total Bilirubin: 2 mg/dL — ABNORMAL HIGH (ref 0.0–1.2)
Total Protein: 6 g/dL — ABNORMAL LOW (ref 6.5–8.1)

## 2024-03-12 LAB — GLUCOSE, CAPILLARY
Glucose-Capillary: 86 mg/dL (ref 70–99)
Glucose-Capillary: 95 mg/dL (ref 70–99)
Glucose-Capillary: 94 mg/dL (ref 70–99)
Glucose-Capillary: 97 mg/dL (ref 70–99)

## 2024-03-12 LAB — MAGNESIUM: Magnesium: 1.5 mg/dL — ABNORMAL LOW (ref 1.7–2.4)

## 2024-03-12 MED ORDER — POTASSIUM CHLORIDE CRYS ER 20 MEQ PO TBCR
40.0000 meq | EXTENDED_RELEASE_TABLET | ORAL | Status: AC
  Administered 2024-03-12 (×2): 40 meq via ORAL
  Filled 2024-03-12 (×2): qty 2

## 2024-03-12 MED ORDER — MAGNESIUM SULFATE 2 GM/50ML IV SOLN
2.0000 g | Freq: Once | INTRAVENOUS | Status: AC
  Administered 2024-03-12: 2 g via INTRAVENOUS
  Filled 2024-03-12: qty 50

## 2024-03-12 NOTE — Progress Notes (Signed)
 Subjective: Says his pain is improved today.  Tolerating his CLD with no nausea.  No other complaints   Objective: Vital signs in last 24 hours: Temp:  [97.9 F (36.6 C)-98.4 F (36.9 C)] 97.9 F (36.6 C) (09/05 0837) Pulse Rate:  [87-99] 88 (09/05 0837) Resp:  [15-18] 18 (09/05 0837) BP: (133-149)/(79-85) 133/79 (09/05 0837) SpO2:  [93 %-97 %] 97 % (09/05 0837) Last BM Date : 03/12/24  Intake/Output from previous day: 09/04 0701 - 09/05 0700 In: 2400.2 [P.O.:600; I.V.:1699.1; IV Piggyback:101] Out: 200 [Urine:200] Intake/Output this shift: No intake/output data recorded.  PE: Gen: NAD Heart: regular Lungs: respiratory effort nonlabored, CTAB Abd: soft, mild epigastric tenderness, but none in RUQ, ND  Lab Results:  Recent Labs    03/11/24 0420 03/12/24 0442  WBC 24.9* 15.6*  HGB 14.5 13.8  HCT 40.1 37.9*  PLT 182 202   BMET Recent Labs    03/11/24 0420 03/12/24 0442  NA 132* 132*  K 3.6 3.2*  CL 98 99  CO2 22 25  GLUCOSE 94 91  BUN 7* 6*  CREATININE 0.83 0.83  CALCIUM  8.7* 8.7*   PT/INR No results for input(s): LABPROT, INR in the last 72 hours. CMP     Component Value Date/Time   NA 132 (L) 03/12/2024 0442   NA 141 02/05/2022 1431   K 3.2 (L) 03/12/2024 0442   CL 99 03/12/2024 0442   CO2 25 03/12/2024 0442   GLUCOSE 91 03/12/2024 0442   BUN 6 (L) 03/12/2024 0442   BUN 12 02/05/2022 1431   CREATININE 0.83 03/12/2024 0442   CREATININE 0.82 08/23/2014 1153   CALCIUM  8.7 (L) 03/12/2024 0442   PROT 6.0 (L) 03/12/2024 0442   ALBUMIN 2.6 (L) 03/12/2024 0442   AST 96 (H) 03/12/2024 0442   ALT 57 (H) 03/12/2024 0442   ALKPHOS 82 03/12/2024 0442   BILITOT 2.0 (H) 03/12/2024 0442   GFRNONAA >60 03/12/2024 0442   GFRAA >60 11/06/2016 0435   Lipase     Component Value Date/Time   LIPASE 14 03/10/2024 1219       Studies/Results: US  Abdomen Limited RUQ (LIVER/GB) Result Date: 03/10/2024 CLINICAL DATA:  Abdominal pain. EXAM:  ULTRASOUND ABDOMEN LIMITED RIGHT UPPER QUADRANT COMPARISON:  CT scan of same day. FINDINGS: Gallbladder: Cholelithiasis is noted with moderate gallbladder wall thickening measured at 8 mm. No sonographic Murphy's sign is noted, but pericholecystic fluid is noted. Sludge is noted as well. Largest gallstone measures 5 mm. Probable 5 mm gallbladder polyp is noted as well. Common bile duct: Diameter: 5 mm which is within normal limits Liver: No focal lesion identified. Within normal limits in parenchymal echogenicity. Portal vein is patent on color Doppler imaging with normal direction of blood flow towards the liver. Other: None. IMPRESSION: 1. Cholelithiasis is noted with moderate gallbladder wall thickening and pericholecystic fluid. No sonographic Murphy's sign is noted. Findings are concerning for possible cholecystitis. HIDA scan may be performed for further evaluation. 2. Probable 5 mm gallbladder polyp is noted. Electronically Signed   By: Lynwood Landy Raddle M.D.   On: 03/10/2024 15:49   CT ABDOMEN PELVIS W CONTRAST Result Date: 03/10/2024 CLINICAL DATA:  Abdominal pain EXAM: CT ABDOMEN AND PELVIS WITH CONTRAST TECHNIQUE: Multidetector CT imaging of the abdomen and pelvis was performed using the standard protocol following bolus administration of intravenous contrast. RADIATION DOSE REDUCTION: This exam was performed according to the departmental dose-optimization program which includes automated exposure control, adjustment of the  mA and/or kV according to patient size and/or use of iterative reconstruction technique. CONTRAST:  OMNIPAQUE  IOHEXOL  350 MG/ML SOLN COMPARISON:  CT angio chest March 10, 2024 on August 15, 2022 FINDINGS: Lower chest: Right lung base scarring/subsegmental atelectasis. Hepatobiliary: Hypoattenuating liver parenchyma consistent with hepatic steatosis. Focal fat deposition is segment 4 measuring 1.4 cm, typical location. Gallbladder wall thickening and pericholecystic and  subhepatic mild fat stranding with associated cholelithiasis suggestive of acute infectious/inflammatory changes. No pericholecystic fluid collection. Pancreas: Mild atrophic changes of the pancreas. No pancreatic ductal dilatation or mass lesion. Spleen: Normal in size without focal abnormality. Adrenals/Urinary Tract: Simple renal cortical cysts in both kidneys which does not require imaging follow-up. Otherwise unremarkable. No hydronephrosis. Adrenal glands are unremarkable. Bladder is thick-walled and under distended. Stomach/Bowel: Stomach is within normal limits. Scattered colonic diverticula. No evidence of bowel wall thickening, distention, or inflammatory changes. Vascular/Lymphatic: No significant vascular findings are present. No enlarged abdominal or pelvic lymph nodes. Reproductive: Prostatomegaly. Other: Tiny fat containing paraumbilical hernia with a defect measuring 7 mm. Bilateral fat containing inguinal hernias left greater than right. Musculoskeletal: Multilevel degenerative changes of the spine. IMPRESSION: Cholelithiasis with CT findings suggestive of acute cholecystitis. Correlate with clinical findings. Hepatic steatosis with focal fat in segment 4. Additional findings as above. Electronically Signed   By: Megan  Zare M.D.   On: 03/10/2024 14:51   CT Angio Chest PE W and/or Wo Contrast Addendum Date: 03/10/2024 ADDENDUM REPORT: 03/10/2024 14:26 ADDENDUM: Gallstones with gallbladder distension and inflammatory stranding surrounding the gallbladder concerning for acute cholecystitis. This could be further evaluated with right upper quadrant ultrasound. These results were called by telephone at the time of interpretation on 03/10/2024 at 2:25 pm to provider [facility] , who verbally acknowledged these results. Electronically Signed   By: Franky Crease M.D.   On: 03/10/2024 14:26   Result Date: 03/10/2024 CLINICAL DATA:  Pulmonary embolism (PE) suspected, high prob. Weakness, dizziness,  vomiting EXAM: CT ANGIOGRAPHY CHEST WITH CONTRAST TECHNIQUE: Multidetector CT imaging of the chest was performed using the standard protocol during bolus administration of intravenous contrast. Multiplanar CT image reconstructions and MIPs were obtained to evaluate the vascular anatomy. RADIATION DOSE REDUCTION: This exam was performed according to the departmental dose-optimization program which includes automated exposure control, adjustment of the mA and/or kV according to patient size and/or use of iterative reconstruction technique. CONTRAST:  OMNIPAQUE  IOHEXOL  350 MG/ML SOLN COMPARISON:  08/15/2022 FINDINGS: Cardiovascular: Three-vessel coronary artery disease and aortic atherosclerosis. Heart is normal size. Aorta is normal caliber. No filling defects in the pulmonary arteries to suggest pulmonary emboli. Mediastinum/Nodes: No mediastinal, hilar, or axillary adenopathy. Trachea and esophagus are unremarkable. Thyroid  unremarkable. Lungs/Pleura: Linear areas of atelectasis in the lower lobes bilaterally. Nodular area in the lingula is new since prior study and may also reflect atelectasis, measuring 12 mm. This warrants follow-up. No effusions. Upper Abdomen: Gallbladder is distended with layering gallstones. Surrounding inflammatory change. Findings concerning for acute cholecystitis. Musculoskeletal: Chest wall soft tissues are unremarkable. No acute bony abnormality. Review of the MIP images confirms the above findings. IMPRESSION: No evidence of pulmonary embolus. Three-vessel coronary artery disease. Lower lobe atelectasis. 12 mm nodular density in the lingula may also reflect atelectasis but recommend follow-up CT and 6 months to ensure resolution. Aortic Atherosclerosis (ICD10-I70.0). Electronically Signed: By: Franky Crease M.D. On: 03/10/2024 14:12    Anti-infectives: Anti-infectives (From admission, onward)    Start     Dose/Rate Route Frequency Ordered Stop   03/11/24  0015   piperacillin -tazobactam (ZOSYN ) IVPB 3.375 g       Note to Pharmacy: Please dose.   3.375 g 12.5 mL/hr over 240 Minutes Intravenous Every 8 hours 03/11/24 0005     03/10/24 1430  piperacillin -tazobactam (ZOSYN ) IVPB 3.375 g        3.375 g 100 mL/hr over 30 Minutes Intravenous  Once 03/10/24 1426 03/10/24 1600        Assessment/Plan Acute cholecystitis -WBC down to 14K today.  Cont abx therapy -AST/ALT up some today, TB stable around 2, alk phos normal, monitor -cont CLD today, NPO p MN -last dose of plavix  and research drug (asundexian) were Wednesday am.  Neuro has recommended to treat this research drug like Eliquis and hold for 48-72 hrs pre-[name] and resume post op as able.  We will make him NPO p MN for Saturday and discuss with oncoming MD about comfort in proceeding Saturday vs Sunday for continued washout of both medications -d/w the above with the patient who expresses understanding.   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   LOS: 2 days    Jerry [name] , Jerry [name] 03/12/2024, 11:28 AM Please see Amion for pager number during day hours 7:00am-4:30pm or 7:00am -11:30am on weekends

## 2024-03-12 NOTE — Care Management Important Message (Signed)
 Important Message  Patient Details  Name: Jerry [name] MRN: [id number] Date of Birth: 1956-05-29   Important Message Given:  Yes - Medicare IM     [name] 03/12/2024, 3:55 PM

## 2024-03-12 NOTE — Progress Notes (Signed)
 [facility] Progress Note Patient: Jerry [name] FMW:[id number] DOB: [birth year]  DOA: 03/10/2024 DOS: the patient was seen and examined on 03/12/2024  Brief Hospital Course: Patient with PMH of HTN, type II DM, HLD, cryptogenic stroke, ILR in situ. Presented the hospital with complaints of dizziness and fatigue. He also had episodes of vomiting on a daily basis for last 3 days. Workup in the ED shows evidence of possible cholecystitis. General surgery consulted. Currently being monitored clinically with IV antibiotics and also awaiting for Plavix /asundexian washout.  Assessment and Plan: Acute cholecystitis. Calculus. CT abdomen shows evidence of gallbladder wall thickening with [name] cholecystic stranding. So far no evidence of choledocholithiasis. Receiving IV antibiotics. Has leukocytosis. On clear liquid diet. Surgery monitoring clinically.  History of cryptogenic stroke. Patient is on aspirin  81 mg, Plavix  75 mg as well as [employer] medication asundexian.  Currently on medications are on hold. Last dose of this medication was on Wednesday morning. Recommendation from neurology is to hold asundexian for 48 to 72 hours before performing surgery. Last ILR evaluation did not show any evidence of arrhythmia or pause.  HTN. Blood pressure stable. On Norvasc .  For now continue.  Type 2 diabetes mellitus, controlled with long-term insulin  use with HLD and stroke Recent hemoglobin A1c 3.6. Currently on sliding scale insulin . For now monitor.  Weight loss. Patient and the family reports that he has lost 70 pounds in last 1 year. Discussed that this could be just an effect of Mounjaro. CT chest abdomen and pelvis negative for any occult malignancy for now. Monitor clinically outpatient.  HLD. On Zetia  and statin. For now on hold.  Hyponatremia. Sodium 132, clinically nonsignificant.  Macrocytosis. For now we will monitor.   Subjective: No nausea no vomiting  no fever no chills.  No chest pain.  Abdominal pain improving.  Had a BM.  Physical Exam: Clear to auscultation back Bowel sound present No edema.  Data Reviewed: I have Reviewed nursing notes, Vitals, and Lab results. Since last encounter, pertinent lab results CBC and BMP   . I have ordered test including CBC and BMP  .   Disposition: Status is: Inpatient Remains inpatient appropriate because: Monitor for surgical planning and postop recovery  enoxaparin  (LOVENOX ) injection 40 mg Start: 03/11/24 1400 SCDs Start: 03/10/24 2200   Family Communication: Family at bedside Level of care: Med-Surg   Vitals:   03/11/24 2007 03/12/24 0404 03/12/24 0837 03/12/24 1500  BP: (!) 149/85 134/80 133/79 128/87  Pulse: 99 87 88 88  Resp:  15 18 18   Temp: 98.2 F (36.8 C) 98.4 F (36.9 C) 97.9 F (36.6 C) 98 F (36.7 C)  TempSrc:  Oral    SpO2: 93% 97% 97% 97%  Weight:      Height:         Author: Yetta Blanch, MD 03/12/2024 6:48 PM  Please look on [email or web address] to find out who is on call.

## 2024-03-13 DIAGNOSIS — K810 Acute cholecystitis: Secondary | ICD-10-CM | POA: Diagnosis not present

## 2024-03-13 LAB — COMPREHENSIVE METABOLIC PANEL WITH GFR
ALT: 66 U/L — ABNORMAL HIGH (ref 0–44)
AST: 93 U/L — ABNORMAL HIGH (ref 15–41)
Albumin: 2.6 g/dL — ABNORMAL LOW (ref 3.5–5.0)
Alkaline Phosphatase: 80 U/L (ref 38–126)
Anion gap: 12 (ref 5–15)
BUN: <5 mg/dL — ABNORMAL LOW (ref 8–23)
CO2: 23 mmol/L (ref 22–32)
Calcium: 8.6 mg/dL — ABNORMAL LOW (ref 8.9–10.3)
Chloride: 100 mmol/L (ref 98–111)
Creatinine, Ser: 0.75 mg/dL (ref 0.61–1.24)
GFR, Estimated: >60 mL/min (ref >60)
Glucose, Bld: 92 mg/dL (ref 70–99)
Potassium: 3.4 mmol/L — ABNORMAL LOW (ref 3.5–5.1)
Sodium: 135 mmol/L (ref 135–145)
Total Bilirubin: 1.5 mg/dL — ABNORMAL HIGH (ref 0.0–1.2)
Total Protein: 6.2 g/dL — ABNORMAL LOW (ref 6.5–8.1)

## 2024-03-13 LAB — CBC WITH DIFFERENTIAL/PLATELET
Abs Immature Granulocytes: 0.07 K/uL (ref 0.00–0.07)
Basophils Absolute: 0 K/uL (ref 0.0–0.1)
Basophils Relative: 0 %
Eosinophils Absolute: 0.1 K/uL (ref 0.0–0.5)
Eosinophils Relative: 1 %
HCT: 39.5 % (ref 39.0–52.0)
Hemoglobin: 14.1 g/dL (ref 13.0–17.0)
Immature Granulocytes: 1 %
Lymphocytes Relative: 18 %
Lymphs Abs: 1.9 K/uL (ref 0.7–4.0)
MCH: 35.9 pg — ABNORMAL HIGH (ref 26.0–34.0)
MCHC: 35.7 g/dL (ref 30.0–36.0)
MCV: 100.5 fL — ABNORMAL HIGH (ref 80.0–100.0)
Monocytes Absolute: 1 K/uL (ref 0.1–1.0)
Monocytes Relative: 10 %
Neutro Abs: 7.5 K/uL (ref 1.7–7.7)
Neutrophils Relative %: 70 %
Platelets: 243 K/uL (ref 150–400)
RBC: 3.93 MIL/uL — ABNORMAL LOW (ref 4.22–5.81)
RDW: 12.3 % (ref 11.5–15.5)
WBC: 10.5 K/uL (ref 4.0–10.5)
nRBC: 0 % (ref 0.0–0.2)

## 2024-03-13 LAB — GLUCOSE, CAPILLARY
Glucose-Capillary: 95 mg/dL (ref 70–99)
Glucose-Capillary: 127 mg/dL — ABNORMAL HIGH (ref 70–99)
Glucose-Capillary: 122 mg/dL — ABNORMAL HIGH (ref 70–99)
Glucose-Capillary: 138 mg/dL — ABNORMAL HIGH (ref 70–99)

## 2024-03-13 LAB — MAGNESIUM: Magnesium: 1.6 mg/dL — ABNORMAL LOW (ref 1.7–2.4)

## 2024-03-13 MED ORDER — PIPERACILLIN-TAZOBACTAM 3.375 G IVPB
3.3750 g | Freq: Three times a day (TID) | INTRAVENOUS | Status: DC
  Administered 2024-03-13 – 2024-03-16 (×10): 3.375 g via INTRAVENOUS
  Filled 2024-03-13 (×9): qty 50

## 2024-03-13 MED ORDER — MAGNESIUM SULFATE 4 GM/100ML IV SOLN
4.0000 g | Freq: Once | INTRAVENOUS | Status: AC
  Administered 2024-03-13: 4 g via INTRAVENOUS
  Filled 2024-03-13: qty 100

## 2024-03-13 MED ORDER — ONDANSETRON HCL 4 MG/2ML IJ SOLN
4.0000 mg | Freq: Four times a day (QID) | INTRAMUSCULAR | Status: DC | PRN
  Administered 2024-03-14: 4 mg via INTRAVENOUS
  Filled 2024-03-13: qty 2

## 2024-03-13 MED ORDER — POTASSIUM CHLORIDE CRYS ER 20 MEQ PO TBCR
40.0000 meq | EXTENDED_RELEASE_TABLET | ORAL | Status: AC
  Administered 2024-03-13 (×2): 40 meq via ORAL
  Filled 2024-03-13 (×2): qty 2

## 2024-03-13 MED ORDER — SACCHAROMYCES BOULARDII 250 MG PO CAPS
250.0000 mg | ORAL_CAPSULE | Freq: Two times a day (BID) | ORAL | Status: DC
  Administered 2024-03-13 – 2024-03-16 (×6): 250 mg via ORAL
  Filled 2024-03-13 (×8): qty 1

## 2024-03-13 MED ORDER — OXYCODONE HCL 5 MG PO TABS
5.0000 mg | ORAL_TABLET | ORAL | Status: DC | PRN
  Administered 2024-03-13 – 2024-03-16 (×11): 5 mg via ORAL
  Filled 2024-03-13 (×12): qty 1

## 2024-03-13 MED ORDER — ACETAMINOPHEN 325 MG PO TABS: 650.0000 mg | ORAL_TABLET | Freq: Four times a day (QID) | ORAL | Status: DC | PRN

## 2024-03-13 NOTE — Plan of Care (Signed)

## 2024-03-13 NOTE — Progress Notes (Signed)
 Subjective: Pain continues to improve, WBC now normal. Tbili downtrending to 1.5.   Objective: Vital signs in last 24 hours: Temp:  [97.9 F (36.6 C)-98 F (36.7 C)] 98 F (36.7 C) (09/06 0820) Pulse Rate:  [79-88] 79 (09/06 0820) Resp:  [13-18] 18 (09/06 0820) BP: (122-160)/(79-93) 160/93 (09/06 0820) SpO2:  [94 %-97 %] 96 % (09/06 0820) Last BM Date : 03/12/24  Intake/Output from previous day: 09/05 0701 - 09/06 0700 In: 99.3 [IV Piggyback:99.3] Out: -  Intake/Output this shift: No intake/output data recorded.  PE: Gen: NAD Lungs: normal work of breathing on room air Abd: soft, nondistended, nontender to palpation  Lab Results:  Recent Labs    03/12/24 0442 03/13/24 0621  WBC 15.6* 10.5  HGB 13.8 14.1  HCT 37.9* 39.5  PLT 202 243   BMET Recent Labs    03/12/24 0442 03/13/24 0621  NA 132* 135  K 3.2* 3.4*  CL 99 100  CO2 25 23  GLUCOSE 91 92  BUN 6* <5*  CREATININE 0.83 0.75  CALCIUM  8.7* 8.6*   PT/INR No results for input(s): LABPROT, INR in the last 72 hours. CMP     Component Value Date/Time   NA 135 03/13/2024 0621   NA 141 02/05/2022 1431   K 3.4 (L) 03/13/2024 0621   CL 100 03/13/2024 0621   CO2 23 03/13/2024 0621   GLUCOSE 92 03/13/2024 0621   BUN <5 (L) 03/13/2024 0621   BUN 12 02/05/2022 1431   CREATININE 0.75 03/13/2024 0621   CREATININE 0.82 08/23/2014 1153   CALCIUM  8.6 (L) 03/13/2024 0621   PROT 6.2 (L) 03/13/2024 0621   ALBUMIN 2.6 (L) 03/13/2024 0621   AST 93 (H) 03/13/2024 0621   ALT 66 (H) 03/13/2024 0621   ALKPHOS 80 03/13/2024 0621   BILITOT 1.5 (H) 03/13/2024 0621   GFRNONAA >60 03/13/2024 0621   GFRAA >60 11/06/2016 0435   Lipase     Component Value Date/Time   LIPASE 14 03/10/2024 1219       Studies/Results: No results found.   Anti-infectives: Anti-infectives (From admission, onward)    Start     Dose/Rate Route Frequency Ordered Stop   03/13/24 1000  piperacillin -tazobactam (ZOSYN )  IVPB 3.375 g       Note to Pharmacy: Please dose.   3.375 g 12.5 mL/hr over 240 Minutes Intravenous Every 8 hours 03/13/24 0922     03/11/24 0015  piperacillin -tazobactam (ZOSYN ) IVPB 3.375 g  Status:  Discontinued       Note to Pharmacy: Please dose.   3.375 g 12.5 mL/hr over 240 Minutes Intravenous Every 8 hours 03/11/24 0005 03/13/24 0922   03/10/24 1430  piperacillin -tazobactam (ZOSYN ) IVPB 3.375 g        3.375 g 100 mL/hr over 30 Minutes Intravenous  Once 03/10/24 1426 03/10/24 1600        Assessment/Plan Acute cholecystitis -Continue antibiotics, patient is clinically improving -Will not be able to due surgery today due to full schedule with other emergent cases, will tentatively plan for surgery tomorrow. -Patient has had significant improvement on antibiotics with resolution of pain, ok to trail regular diet today. Keep NPO after midnight for possible surgery tomorrow. -last dose of plavix  and research drug lovette) were Wednesday am.  Neuro has recommended to treat this research drug like Eliquis and hold for 48-72 hrs pre-surgery and resume post op as able.    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   LOS: 3 days    Leonor LITTIE Dawn , MD [facility] Surgery 03/13/2024, 9:55 AM Please see Amion for pager number during day hours 7:00am-4:30pm or 7:00am -11:30am on weekends

## 2024-03-13 NOTE — Progress Notes (Addendum)
 Patient up to the bathroom independently and walking frequently, refused SCD's at this time.  Patient educated on the importance of wearing SCD's to prevent clots, especially since he has been off of his Plavix  and Asundexian blood thinners since Wednesday morning.  Patient states he will try using SCD's after surgery.

## 2024-03-13 NOTE — Progress Notes (Signed)
 [facility] Progress Note Patient: Jerry [name] FMW:[id number] DOB: [birth year]  DOA: 03/10/2024 DOS: the patient was seen and examined on 03/13/2024  Brief Hospital Course: Patient with PMH of HTN, type II DM, HLD, cryptogenic stroke, ILR in situ. Presented the hospital with complaints of dizziness and fatigue. He also had episodes of vomiting on a daily basis for last 3 days. Workup in the ED shows evidence of possible cholecystitis. General surgery consulted. Currently being monitored clinically with IV antibiotics and also awaiting for Plavix /asundexian washout.  Assessment and Plan: Acute cholecystitis. Calculus. CT abdomen shows evidence of gallbladder wall thickening with [name] cholecystic stranding. Receiving IV antibiotics. Has leukocytosis. On regular diet. Plan for surgery on 9/7.  History of cryptogenic stroke. Patient is on aspirin  81 mg, Plavix  75 mg as well as [employer] medication asundexian.  Currently on medications are on hold. Last dose of this medication was on Wednesday morning. Recommendation from neurology is to hold asundexian for 48 to 72 hours before performing surgery. Last ILR evaluation did not show any evidence of arrhythmia or pause.  HTN. Blood pressure stable. On Norvasc .  For now continue.  Type 2 diabetes mellitus, controlled with long-term insulin  use with HLD and stroke Recent hemoglobin A1c 3.6. Currently on sliding scale insulin . For now monitor.  Weight loss. Patient and the family reports that he has lost 70 pounds in last 1 year. Discussed that this could be just an effect of Mounjaro. CT chest abdomen and pelvis negative for any occult malignancy for now. Monitor clinically outpatient.  HLD. On Zetia  and statin. For now on hold.  Hyponatremia. Sodium 132, clinically nonsignificant.  Macrocytosis. For now we will monitor.   Subjective: Feeling better.  No nausea no vomiting.  Abdominal pain resolved.  Physical  Exam: Clear to auscultation. Bowel sound present.  Nontender. No edema.  Data Reviewed: I have Reviewed nursing notes, Vitals, and Lab results. Since last encounter, pertinent lab results CBC and CMP   . I have ordered test including CBC and CMP  .   Disposition: Status is: Inpatient Remains inpatient appropriate because: Surgery tomorrow.  enoxaparin  (LOVENOX ) injection 40 mg Start: 03/11/24 1400 SCDs Start: 03/10/24 2200   Family Communication: No one at bedside Level of care: Med-Surg   Vitals:   03/12/24 1947 03/13/24 0439 03/13/24 0820 03/13/24 1357  BP: (!) 140/86 122/79 (!) 160/93 132/81  Pulse: 83 84 79 74  Resp: 13 15 18 17   Temp: 97.9 F (36.6 C) 97.9 F (36.6 C) 98 F (36.7 C) 98.6 F (37 C)  TempSrc: Oral  Oral Oral  SpO2: 94% 94% 96% 97%  Weight:      Height:         Author: Yetta Blanch, MD 03/13/2024 6:19 PM  Please look on [email or web address] to find out who is on call.

## 2024-03-14 ENCOUNTER — Inpatient Hospital Stay (HOSPITAL_COMMUNITY): Admitting: Anesthesiology

## 2024-03-14 ENCOUNTER — Other Ambulatory Visit: Payer: Self-pay

## 2024-03-14 ENCOUNTER — Encounter (HOSPITAL_COMMUNITY)
Admission: EM | Disposition: A | Discharge status: Home Health | Payer: Self-pay | Source: Home / Self Care | Attending: Internal Medicine

## 2024-03-14 ENCOUNTER — Inpatient Hospital Stay (HOSPITAL_COMMUNITY)

## 2024-03-14 ENCOUNTER — Encounter (HOSPITAL_COMMUNITY): Payer: Self-pay | Admitting: Internal Medicine

## 2024-03-14 DIAGNOSIS — E119 Type 2 diabetes mellitus without complications: Secondary | ICD-10-CM | POA: Diagnosis not present

## 2024-03-14 DIAGNOSIS — K810 Acute cholecystitis: Secondary | ICD-10-CM | POA: Diagnosis not present

## 2024-03-14 DIAGNOSIS — I679 Cerebrovascular disease, unspecified: Secondary | ICD-10-CM

## 2024-03-14 DIAGNOSIS — F418 Other specified anxiety disorders: Secondary | ICD-10-CM | POA: Diagnosis not present

## 2024-03-14 HISTORY — PX: CHOLECYSTECTOMY: SHX55

## 2024-03-14 HISTORY — PX: INDOCYANINE GREEN FLUORESCENCE IMAGING (ICG): SHX7595

## 2024-03-14 LAB — CBC WITH DIFFERENTIAL/PLATELET
Abs Immature Granulocytes: 0.05 K/uL (ref 0.00–0.07)
Basophils Absolute: 0 K/uL (ref 0.0–0.1)
Basophils Relative: 0 %
Eosinophils Absolute: 0.1 K/uL (ref 0.0–0.5)
Eosinophils Relative: 1 %
HCT: 36.8 % — ABNORMAL LOW (ref 39.0–52.0)
Hemoglobin: 13.4 g/dL (ref 13.0–17.0)
Immature Granulocytes: 1 %
Lymphocytes Relative: 22 %
Lymphs Abs: 1.8 K/uL (ref 0.7–4.0)
MCH: 36.1 pg — ABNORMAL HIGH (ref 26.0–34.0)
MCHC: 36.4 g/dL — ABNORMAL HIGH (ref 30.0–36.0)
MCV: 99.2 fL (ref 80.0–100.0)
Monocytes Absolute: 0.9 K/uL (ref 0.1–1.0)
Monocytes Relative: 12 %
Neutro Abs: 5.1 K/uL (ref 1.7–7.7)
Neutrophils Relative %: 64 %
Platelets: 224 K/uL (ref 150–400)
RBC: 3.71 MIL/uL — ABNORMAL LOW (ref 4.22–5.81)
RDW: 12.1 % (ref 11.5–15.5)
WBC: 7.9 K/uL (ref 4.0–10.5)
nRBC: 0 % (ref 0.0–0.2)

## 2024-03-14 LAB — GLUCOSE, CAPILLARY
Glucose-Capillary: 99 mg/dL (ref 70–99)
Glucose-Capillary: 118 mg/dL — ABNORMAL HIGH (ref 70–99)
Glucose-Capillary: 155 mg/dL — ABNORMAL HIGH (ref 70–99)
Glucose-Capillary: 216 mg/dL — ABNORMAL HIGH (ref 70–99)
Glucose-Capillary: 188 mg/dL — ABNORMAL HIGH (ref 70–99)

## 2024-03-14 LAB — COMPREHENSIVE METABOLIC PANEL WITH GFR
ALT: 82 U/L — ABNORMAL HIGH (ref 0–44)
AST: 92 U/L — ABNORMAL HIGH (ref 15–41)
Albumin: 2.5 g/dL — ABNORMAL LOW (ref 3.5–5.0)
Alkaline Phosphatase: 70 U/L (ref 38–126)
Anion gap: 11 (ref 5–15)
BUN: <5 mg/dL — ABNORMAL LOW (ref 8–23)
CO2: 20 mmol/L — ABNORMAL LOW (ref 22–32)
Calcium: 8.4 mg/dL — ABNORMAL LOW (ref 8.9–10.3)
Chloride: 102 mmol/L (ref 98–111)
Creatinine, Ser: 0.72 mg/dL (ref 0.61–1.24)
GFR, Estimated: >60 mL/min (ref >60)
Glucose, Bld: 94 mg/dL (ref 70–99)
Potassium: 3.7 mmol/L (ref 3.5–5.1)
Sodium: 133 mmol/L — ABNORMAL LOW (ref 135–145)
Total Bilirubin: 1.1 mg/dL (ref 0.0–1.2)
Total Protein: 5.8 g/dL — ABNORMAL LOW (ref 6.5–8.1)

## 2024-03-14 LAB — TYPE AND SCREEN
ABO/RH(D): A POS
Antibody Screen: NEGATIVE

## 2024-03-14 LAB — MAGNESIUM: Magnesium: 1.7 mg/dL (ref 1.7–2.4)

## 2024-03-14 SURGERY — LAPAROSCOPIC CHOLECYSTECTOMY WITH INTRAOPERATIVE CHOLANGIOGRAM
Anesthesia: General | Site: Abdomen

## 2024-03-14 MED ORDER — PROPOFOL 10 MG/ML IV BOLUS
INTRAVENOUS | Status: AC
Start: 2024-03-14 — End: 2024-03-14
  Filled 2024-03-14: qty 20

## 2024-03-14 MED ORDER — MIDAZOLAM HCL 2 MG/2ML IJ SOLN
INTRAMUSCULAR | Status: AC
Start: 2024-03-14 — End: 2024-03-14
  Filled 2024-03-14: qty 2

## 2024-03-14 MED ORDER — LACTATED RINGERS IV SOLN: INTRAVENOUS | Status: DC | PRN

## 2024-03-14 MED ORDER — SUGAMMADEX SODIUM 200 MG/2ML IV SOLN
INTRAVENOUS | Status: DC | PRN
  Administered 2024-03-14 (×2): 200 mg via INTRAVENOUS

## 2024-03-14 MED ORDER — INDOCYANINE GREEN 25 MG IV SOLR
1.2500 mg | Freq: Once | INTRAVENOUS | Status: AC
  Administered 2024-03-14: 1.25 mg via INTRAVENOUS
  Filled 2024-03-14: qty 10

## 2024-03-14 MED ORDER — PHENYLEPHRINE HCL-NACL 20-0.9 MG/250ML-% IV SOLN
INTRAVENOUS | Status: DC | PRN
Start: 2024-03-14 — End: 2024-03-14
  Administered 2024-03-14: 50 ug/min via INTRAVENOUS

## 2024-03-14 MED ORDER — CHLORHEXIDINE GLUCONATE 0.12 % MT SOLN
OROMUCOSAL | Status: AC
  Filled 2024-03-14: qty 15

## 2024-03-14 MED ORDER — BUPIVACAINE-EPINEPHRINE (PF) 0.25% -1:200000 IJ SOLN
INTRAMUSCULAR | Status: AC
Start: 2024-03-14 — End: 2024-03-14
  Filled 2024-03-14: qty 30

## 2024-03-14 MED ORDER — PHENYLEPHRINE 80 MCG/ML (10ML) SYRINGE FOR IV PUSH (FOR BLOOD PRESSURE SUPPORT)
PREFILLED_SYRINGE | INTRAVENOUS | Status: DC | PRN
  Administered 2024-03-14: 160 ug via INTRAVENOUS
  Administered 2024-03-14: 80 ug via INTRAVENOUS
  Administered 2024-03-14: 160 ug via INTRAVENOUS

## 2024-03-14 MED ORDER — MIDAZOLAM HCL 2 MG/2ML IJ SOLN
INTRAMUSCULAR | Status: DC | PRN
  Administered 2024-03-14: 2 mg via INTRAVENOUS

## 2024-03-14 MED ORDER — ONDANSETRON HCL 4 MG/2ML IJ SOLN
INTRAMUSCULAR | Status: DC | PRN
  Administered 2024-03-14: 4 mg via INTRAVENOUS

## 2024-03-14 MED ORDER — CHLORHEXIDINE GLUCONATE 0.12 % MT SOLN
15.0000 mL | Freq: Once | OROMUCOSAL | Status: AC
  Administered 2024-03-14: 15 mL via OROMUCOSAL

## 2024-03-14 MED ORDER — SODIUM CHLORIDE 0.9 % IR SOLN
Status: DC | PRN
  Administered 2024-03-14: 1000 mL

## 2024-03-14 MED ORDER — FENTANYL CITRATE (PF) 250 MCG/5ML IJ SOLN
INTRAMUSCULAR | Status: DC | PRN
  Administered 2024-03-14: 50 ug via INTRAVENOUS
  Administered 2024-03-14: 100 ug via INTRAVENOUS

## 2024-03-14 MED ORDER — LACTATED RINGERS IV SOLN: INTRAVENOUS | Status: DC

## 2024-03-14 MED ORDER — OXYCODONE HCL 5 MG PO TABS: 5.0000 mg | ORAL_TABLET | Freq: Once | ORAL | Status: DC | PRN

## 2024-03-14 MED ORDER — FENTANYL CITRATE (PF) 100 MCG/2ML IJ SOLN
INTRAMUSCULAR | Status: AC
  Filled 2024-03-14: qty 2

## 2024-03-14 MED ORDER — ROCURONIUM BROMIDE 10 MG/ML (PF) SYRINGE
PREFILLED_SYRINGE | INTRAVENOUS | Status: DC | PRN
  Administered 2024-03-14: 70 mg via INTRAVENOUS

## 2024-03-14 MED ORDER — PROPOFOL 10 MG/ML IV BOLUS
INTRAVENOUS | Status: DC | PRN
  Administered 2024-03-14: 100 mg via INTRAVENOUS
  Administered 2024-03-14: 40 mg via INTRAVENOUS

## 2024-03-14 MED ORDER — FENTANYL CITRATE (PF) 100 MCG/2ML IJ SOLN
25.0000 ug | INTRAMUSCULAR | Status: DC | PRN
  Administered 2024-03-14 (×2): 50 ug via INTRAVENOUS

## 2024-03-14 MED ORDER — 0.9 % SODIUM CHLORIDE (POUR BTL) OPTIME
TOPICAL | Status: DC | PRN
  Administered 2024-03-14: 1000 mL

## 2024-03-14 MED ORDER — PROPOFOL 10 MG/ML IV BOLUS
INTRAVENOUS | Status: AC
  Filled 2024-03-14: qty 20

## 2024-03-14 MED ORDER — BUPIVACAINE-EPINEPHRINE 0.25% -1:200000 IJ SOLN
INTRAMUSCULAR | Status: DC | PRN
  Administered 2024-03-14: 30 mL

## 2024-03-14 MED ORDER — ACETAMINOPHEN 10 MG/ML IV SOLN
1000.0000 mg | Freq: Once | INTRAVENOUS | Status: DC | PRN
Start: 2024-03-14 — End: 2024-03-14
  Administered 2024-03-14: 1000 mg via INTRAVENOUS

## 2024-03-14 MED ORDER — OXYCODONE HCL 5 MG/5ML PO SOLN: 5.0000 mg | Freq: Once | ORAL | Status: DC | PRN

## 2024-03-14 MED ORDER — ORAL CARE MOUTH RINSE: 15.0000 mL | Freq: Once | OROMUCOSAL | Status: AC

## 2024-03-14 MED ORDER — FENTANYL CITRATE (PF) 250 MCG/5ML IJ SOLN
INTRAMUSCULAR | Status: AC
  Filled 2024-03-14: qty 5

## 2024-03-14 MED ORDER — HEMOSTATIC AGENTS (NO CHARGE) OPTIME
TOPICAL | Status: DC | PRN
Start: 2024-03-14 — End: 2024-03-14
  Administered 2024-03-14: 1 via TOPICAL

## 2024-03-14 MED ORDER — DEXAMETHASONE SODIUM PHOSPHATE 10 MG/ML IJ SOLN
INTRAMUSCULAR | Status: DC | PRN
  Administered 2024-03-14: 5 mg via INTRAVENOUS

## 2024-03-14 MED ORDER — LIDOCAINE 2% (20 MG/ML) 5 ML SYRINGE
INTRAMUSCULAR | Status: DC | PRN
  Administered 2024-03-14: 60 mg via INTRAVENOUS

## 2024-03-14 SURGICAL SUPPLY — 36 items
BAG COUNTER SPONGE SURGICOUNT (BAG) ×2 IMPLANT
BLADE CLIPPER SURG (BLADE) IMPLANT
CANISTER SUCTION 3000ML PPV (SUCTIONS) ×2 IMPLANT
CHLORAPREP W/TINT 26 (MISCELLANEOUS) ×2 IMPLANT
CLIP APPLIE 5 13 M/L LIGAMAX5 (MISCELLANEOUS) ×2 IMPLANT
COVER SURGICAL LIGHT HANDLE (MISCELLANEOUS) ×2 IMPLANT
DERMABOND ADVANCED .7 DNX12 (GAUZE/BANDAGES/DRESSINGS) ×2 IMPLANT
DRAIN CHANNEL 19F RND (DRAIN) IMPLANT
ELECTRODE REM PT RTRN 9FT ADLT (ELECTROSURGICAL) ×2 IMPLANT
EVACUATOR SILICONE 100CC (DRAIN) IMPLANT
GLOVE BIOGEL PI IND STRL 6 (GLOVE) ×2 IMPLANT
GLOVE BIOGEL PI MICRO STRL 5.5 (GLOVE) ×2 IMPLANT
GOWN STRL REUS W/ TWL LRG LVL3 (GOWN DISPOSABLE) ×6 IMPLANT
IRRIGATION SUCT STRKRFLW 2 WTP (MISCELLANEOUS) ×2 IMPLANT
KIT BASIN OR (CUSTOM PROCEDURE TRAY) ×2 IMPLANT
KIT TURNOVER KIT B (KITS) ×2 IMPLANT
LHOOK LAP DISP 36CM (ELECTROSURGICAL) ×2 IMPLANT
NS IRRIG 1000ML POUR BTL (IV SOLUTION) ×2 IMPLANT
PAD ARMBOARD POSITIONER FOAM (MISCELLANEOUS) ×2 IMPLANT
PENCIL BUTTON HOLSTER BLD 10FT (ELECTRODE) ×2 IMPLANT
POUCH RETRIEVAL ECOSAC 10 (ENDOMECHANICALS) IMPLANT
POWDER SURGICEL 3.0 GRAM (HEMOSTASIS) IMPLANT
SCISSORS LAP 5X35 DISP (ENDOMECHANICALS) ×2 IMPLANT
SET TUBE SMOKE EVAC HIGH FLOW (TUBING) ×2 IMPLANT
SLEEVE Z-THREAD 5X100MM (TROCAR) ×4 IMPLANT
SPECIMEN JAR SMALL (MISCELLANEOUS) ×2 IMPLANT
SUT ETHILON 2 0 FS 18 (SUTURE) IMPLANT
SUT MNCRL AB 4-0 PS2 18 (SUTURE) ×2 IMPLANT
SUT VICRYL 0 UR6 27IN ABS (SUTURE) ×2 IMPLANT
TIP ENDOSCOPIC SURGICEL (TIP) IMPLANT
TOWEL GREEN STERILE FF (TOWEL DISPOSABLE) ×2 IMPLANT
TRAY LAPAROSCOPIC MC (CUSTOM PROCEDURE TRAY) ×2 IMPLANT
TROCAR BALLN 12MMX100 BLUNT (TROCAR) ×2 IMPLANT
TROCAR Z-THREAD OPTICAL 5X100M (TROCAR) ×2 IMPLANT
WARMER LAPAROSCOPE (MISCELLANEOUS) ×2 IMPLANT
WATER STERILE IRR 1000ML POUR (IV SOLUTION) ×2 IMPLANT

## 2024-03-14 NOTE — Anesthesia Preprocedure Evaluation (Addendum)
 Anesthesia Evaluation  Patient identified by MRN, date of birth, ID band Patient awake    Reviewed: Allergy & Precautions, NPO status , Patient's Chart, lab work & pertinent test results  History of Anesthesia Complications Negative for: history of anesthetic complications  Airway Mallampati: II  TM Distance: >3 FB Neck ROM: Full    Dental  (+) Upper Dentures, Dental Advisory Given, Partial Lower,    Pulmonary shortness of breath, neg sleep apnea, neg COPD, neg recent URI, former smoker   breath sounds clear to auscultation       Cardiovascular hypertension, (-) angina (-) Past MI and (-) CHF (-) dysrhythmias  Rhythm:Regular  2023:   1. Left ventricular ejection fraction, by estimation, is 70 to 75%. The  left ventricle has hyperdynamic function.   2. Right ventricular systolic function is normal. The right ventricular  size is normal.   3. No left atrial/left atrial appendage thrombus was detected.   4. The mitral valve is normal in structure. No evidence of mitral valve  regurgitation.   5. The aortic valve is normal in structure. Aortic valve regurgitation is  not visualized.   6. There is Severe (Grade IV) plaque.   7. 1-2 bubbles at ~ 3-4 cardiac cycles. possible small PFO.     Neuro/Psych  PSYCHIATRIC DISORDERS Anxiety Depression     Neuromuscular disease CVA, No Residual Symptoms    GI/Hepatic negative GI ROS,,,  Endo/Other  diabetes, Insulin  Dependent  Lab Results      Component                Value               Date                      HGBA1C                   3.6 (L)             03/11/2024             Renal/GU Lab Results      Component                Value               Date                      NA                       133 (L)             03/14/2024                K                        3.7                 03/14/2024                CO2                      20 (L)              03/14/2024                 GLUCOSE  94                  03/14/2024                BUN                      <5 (L)              03/14/2024                CREATININE               0.72                03/14/2024                CALCIUM                   8.4 (L)             03/14/2024                EGFR                     95                  02/05/2022                GFRNONAA                 >60                 03/14/2024                Musculoskeletal  (+) Arthritis ,    Abdominal   Peds  Hematology  (+) Blood dyscrasia Lab Results      Component                Value               Date                      WBC                      7.9                 03/14/2024                HGB                      13.4                03/14/2024                HCT                      36.8 (L)            03/14/2024                MCV                      99.2                03/14/2024                PLT  224                 03/14/2024              Anesthesia Other Findings   Reproductive/Obstetrics                              Anesthesia Physical Anesthesia Plan  ASA: 3  Anesthesia Plan: General   Post-op Pain Management: Ofirmev  IV (intra-op)* and Toradol  IV (intra-op)*   Induction: Intravenous  PONV Risk Score and Plan: 3 and Ondansetron  and Dexamethasone   Airway Management Planned: Oral ETT  Additional Equipment: None  Intra-op Plan:   Post-operative Plan: Extubation in OR  Informed Consent: I have reviewed the patients History and Physical, chart, labs and discussed the procedure including the risks, benefits and alternatives for the proposed anesthesia with the patient or authorized representative who has indicated his/her understanding and acceptance.     Dental advisory given  Plan Discussed with: CRNA  Anesthesia Plan Comments:         Anesthesia Quick Evaluation

## 2024-03-14 NOTE — Progress Notes (Signed)
 Pt to OR with Staff.

## 2024-03-14 NOTE — Progress Notes (Signed)
 * Day of Surgery *  Subjective: No acute changes. Bilirubin continues to downtrend, transaminases mildly elevated.  Objective: Vital signs in last 24 hours: Temp:  [97.8 F (36.6 C)-98.6 F (37 C)] 97.8 F (36.6 C) (09/07 0548) Pulse Rate:  [73-79] 77 (09/07 0548) Resp:  [17-19] 19 (09/07 0548) BP: (132-160)/(80-93) 148/80 (09/07 0548) SpO2:  [96 %-99 %] 99 % (09/07 0548) Last BM Date : 03/12/24  Intake/Output from previous day: 09/06 0701 - 09/07 0700 In: 860 [P.O.:660; IV Piggyback:200] Out: 720 [Urine:720] Intake/Output this shift: Total I/O In: 620 [P.O.:420; IV Piggyback:200] Out: 720 [Urine:720]  PE: Gen: NAD Lungs: normal work of breathing on room air Abd: soft, nondistended, nontender to palpation  Lab Results:  Recent Labs    03/13/24 0621 03/14/24 0422  WBC 10.5 7.9  HGB 14.1 13.4  HCT 39.5 36.8*  PLT 243 224   BMET Recent Labs    03/13/24 0621 03/14/24 0422  NA 135 133*  K 3.4* 3.7  CL 100 102  CO2 23 20*  GLUCOSE 92 94  BUN <5* <5*  CREATININE 0.75 0.72  CALCIUM  8.6* 8.4*   PT/INR No results for input(s): LABPROT, INR in the last 72 hours. CMP     Component Value Date/Time   NA 133 (L) 03/14/2024 0422   NA 141 02/05/2022 1431   K 3.7 03/14/2024 0422   CL 102 03/14/2024 0422   CO2 20 (L) 03/14/2024 0422   GLUCOSE 94 03/14/2024 0422   BUN <5 (L) 03/14/2024 0422   BUN 12 02/05/2022 1431   CREATININE 0.72 03/14/2024 0422   CREATININE 0.82 08/23/2014 1153   CALCIUM  8.4 (L) 03/14/2024 0422   PROT 5.8 (L) 03/14/2024 0422   ALBUMIN 2.5 (L) 03/14/2024 0422   AST 92 (H) 03/14/2024 0422   ALT 82 (H) 03/14/2024 0422   ALKPHOS 70 03/14/2024 0422   BILITOT 1.1 03/14/2024 0422   GFRNONAA >60 03/14/2024 0422   GFRAA >60 11/06/2016 0435   Lipase     Component Value Date/Time   LIPASE 14 03/10/2024 1219       Studies/Results: No results found.   Anti-infectives: Anti-infectives (From admission, onward)    Start      Dose/Rate Route Frequency Ordered Stop   03/13/24 1000  [MAR Hold]  piperacillin -tazobactam (ZOSYN ) IVPB 3.375 g        (MAR Hold since Sun 03/14/2024 at 0624.Hold Reason: Transfer to a Procedural area)  Note to Pharmacy: Please dose.   3.375 g 12.5 mL/hr over 240 Minutes Intravenous Every 8 hours 03/13/24 0922     03/11/24 0015  piperacillin -tazobactam (ZOSYN ) IVPB 3.375 g  Status:  Discontinued       Note to Pharmacy: Please dose.   3.375 g 12.5 mL/hr over 240 Minutes Intravenous Every 8 hours 03/11/24 0005 03/13/24 0922   03/10/24 1430  piperacillin -tazobactam (ZOSYN ) IVPB 3.375 g        3.375 g 100 mL/hr over 30 Minutes Intravenous  Once 03/10/24 1426 03/10/24 1600        Assessment/Plan Acute cholecystitis -Continue antibiotics -Proceed to OR today for laparoscopic cholecystectomy with intra-op cholangiogram. Elevated Tbili was likely secondary to cholecystitis, but will perform cholangiogram to rule out common duct stones. I reviewed the procedure details with the patient, including the beneftis and the risks of bleeding, infection, <0.5% risk of common bile duct injury, and the possibility of a subtotal cholecystectomy with drain placement. He expressed understanding and consents to proceed with surgery. -last dose  of plavix  and research drug lovette) were Wednesday am.  Neuro has recommended to treat this research drug like Eliquis and hold for 48-72 hrs pre-surgery and resume post op as able.    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   LOS: 4 days    Leonor LITTIE Dawn , MD [facility] 03/14/2024, 6:43 AM Please see Amion for pager number during day hours 7:00am-4:30pm or 7:00am -11:30am on  weekends

## 2024-03-14 NOTE — Anesthesia Procedure Notes (Signed)
 Procedure Name: Intubation Date/Time: 03/14/2024 8:07 AM  Performed by: Arvell Edsel HERO, CRNAPre-anesthesia Checklist: Patient identified, Emergency Drugs available, Suction available, Patient being monitored and Timeout performed Patient Re-evaluated:Patient Re-evaluated prior to induction Oxygen Delivery Method: Circle system utilized Preoxygenation: Pre-oxygenation with 100% oxygen Induction Type: IV induction Ventilation: Oral airway inserted - appropriate to patient size and Two handed mask ventilation required Laryngoscope Size: Mac and 3 Grade View: Grade II Tube size: 7.0 mm Number of attempts: 1 Airway Equipment and Method: Stylet and Patient positioned with wedge pillow Placement Confirmation: ETT inserted through vocal cords under direct vision, positive ETCO2, CO2 detector and breath sounds checked- equal and bilateral Secured at: 23 cm Tube secured with: Tape

## 2024-03-14 NOTE — Plan of Care (Signed)
  Problem: Activity: Goal: Risk for activity intolerance will decrease Outcome: Progressing   Problem: Nutrition: Goal: Adequate nutrition will be maintained Outcome: Progressing   Problem: Pain Managment: Goal: General experience of comfort will improve and/or be controlled Outcome: Progressing   Problem: Skin Integrity: Goal: Risk for impaired skin integrity will decrease Outcome: Progressing

## 2024-03-14 NOTE — Anesthesia Postprocedure Evaluation (Signed)
 Anesthesia Post Note  Patient: Jerry [name]  Procedure(s) Performed: LAPAROSCOPIC CHOLECYSTECTOMY (Abdomen) INDOCYANINE GREEN  FLUORESCENCE IMAGING (ICG)     Patient location during evaluation: Nursing Unit Anesthesia Type: General Level of consciousness: awake and alert Pain management: pain level controlled Vital Signs Assessment: post-procedure vital signs reviewed and stable Respiratory status: spontaneous breathing, nonlabored ventilation and respiratory function stable Cardiovascular status: blood pressure returned to baseline and stable Postop Assessment: no apparent nausea or vomiting Anesthetic complications: no   No notable events documented.  Last Vitals:  Vitals:   03/14/24 0959 03/14/24 1010  BP:  (!) 147/83  Pulse: 77 74  Resp: 16 19  Temp: 36.4 C (!) 36.4 C  SpO2: 93% 95%    Last Pain:  Vitals:   03/14/24 1247  TempSrc:   PainSc: 6                  Carsyn Boster

## 2024-03-14 NOTE — Op Note (Addendum)
 Date: 03/14/24  Patient: Jerry [name] MRN: [id number]  Preoperative Diagnosis: Acute cholecystitis Postoperative Diagnosis: Gangrenous cholecystitis  Procedure: Laparoscopic subtotal fenestrating cholecystectomy  Surgeon: Leonor Dawn, MD  EBL: 50 mL  Anesthesia: General endotracheal  Specimens: Gallbladder  Indications: [name] is a 68 yo male who presented with abdominal pain and elevated LFTs, and was found to have acute cholecystitis. He is on antiplatelet therapy for a history of cryptogenic stroke, and surgery was deferred for several days from his last dose. After a discussion of the risks and benefits of cholecystectomy, he agreed to proceed with surgery today.  Findings: Severe acute cholecystitis with gangrenous patches. The cystic duct could not be identified due to extensive inflammation, thus a subtotal fenestrating cholecystectomy was performed. A 19-Fr JP drain was left by the remnant gallbladder. Cholangiogram was not able to be performed due to cystic duct occlusion.  Procedure details: Informed consent was obtained in the preoperative area prior to the procedure. The patient was brought to the operating room and placed on the table in the supine position. General anesthesia was induced and appropriate lines and drains were placed for intraoperative monitoring. Perioperative antibiotics were administered per SCIP guidelines. The abdomen was prepped and draped in the usual sterile fashion. A pre-procedure timeout was taken verifying patient identity, surgical site and procedure to be performed.  A small supraumbilical skin incision was made, the subcutaneous tissue was divided with cautery, and the umbilical stalk was grasped and elevated. The fascia was incised and the peritoneal cavity was directly visualized. A 12mm Hassan trocar was placed and the abdomen was insufflated. The peritoneal cavity was inspected with no evidence of visceral or vascular injury. Three 5mm  ports were placed in the right subcostal margin, all under direct visualization.  The gallbladder was not initially visible as there was omentum adherent over it. The omentum was gently swept down and separated easily from the gallbladder.  The gallbladder was very edematous with areas of gangrene.  The gallbladder was decompressed at the fundus to facilitate retraction.  The fundus of the gallbladder was grasped and retracted cephalad and the infundibulum was retracted laterally.  There was a thick rind over the gallbladder which was opened with cautery.  The rind was gently swept down to expose the infundibulum.  Multiple attempts were made to expose the cystic triangle, however there was dense inflammatory tissue in this area.  ICG had been given preop, however it did not appear that there had been any uptake in the cystic duct or gallbladder, consistent with cystic duct occlusion.  I did not feel that a critical view could be safely obtained, thus I elected to perform a subtotal fenestrating cholecystectomy.  The gallbladder wall was opened near the infundibulum using cautery, and the entire anterior wall of the gallbladder was then removed using cautery.  The cystic artery was transected within the wall of the gallbladder, and hemostasis was achieved with clip placement.  Thick bilious fluid and small stones were evacuated from inside the gallbladder.  The entire anterior wall was removed from the fundus down to the infundibulum.  All stones were removed from within the lumen of the gallbladder.  The cystic duct orifice could not be clearly identified, and there was no return of bile noted in the infundibulum, consistent with cystic duct occlusion secondary to cholecystitis.  The posterior wall of the gallbladder was fulgurated with cautery.  The abdomen was copiously irrigated with sterile saline and all visible stones were extracted.  The  excised portion of the gallbladder was placed in an Ecosac and  extracted via the umbilical port site.  This required extension of the fascial incision.  The abdomen was again irrigated.  There was some oozing on the back wall of the gallbladder which was controlled with cautery.  Surgicel powder was applied to the posterior gallbladder wall.  A 19-Fr JP drain was then placed adjacent to the remnant gallbladder and brought out through the right lateral port site.  The drain was secured to the skin using 2-0 nylon suture.  The remaining ports were removed and the abdomen was desufflated.  The fascia at the umbilical port site was closed with 0 Vicryl figure-of-eight sutures.  The skin at all port sites was closed with 4-0 Monocryl subcuticular suture.  Dermabond was applied.  The patient tolerated the procedure well with no apparent complications.  All counts were correct x2 at the end of the procedure. The patient was extubated and taken to PACU in stable condition.  Leonor Dawn, MD 03/14/24 4:00 PM

## 2024-03-14 NOTE — Progress Notes (Signed)
 [facility] Progress Note Patient: Jerry [name] FMW:[id number] DOB: March 25, 1956  DOA: 03/10/2024 DOS: the patient was seen and examined on 03/14/2024  Brief Hospital Course: Patient with PMH of HTN, type II DM, HLD, cryptogenic stroke, ILR in situ. Presented the hospital with complaints of dizziness and fatigue. He also had episodes of vomiting on a daily basis for last 3 days. Workup in the ED shows evidence of possible cholecystitis. General surgery consulted. Underwent lap chole on 9/7.  Assessment and Plan: Acute calculous cholecystitis. Status post lap cholecystectomy 9/7. CT abdomen shows evidence of gallbladder wall thickening with [name] cholecystic stranding. Receiving IV antibiotics. Monitor postop recovery.  History of cryptogenic stroke. Patient is on aspirin  81 mg, Plavix  75 mg as well as [employer] medication asundexian.  Currently on medications are on hold. Last dose of this medication was on Wednesday morning. Recommendation from neurology is to hold asundexian for 48 to 72 hours before performing surgery. Last ILR evaluation did not show any evidence of arrhythmia or pause. Requested family to bring the medication from home so that it can be resumed in the hospital postoperatively.  HTN. Blood pressure stable. On Norvasc .  For now continue.  Type 2 diabetes mellitus, controlled with long-term insulin  use with HLD and stroke Recent hemoglobin A1c 3.6. Currently on sliding scale insulin . For now monitor.  Weight loss. Patient and the family reports that he has lost 70 pounds in last 1 year. Discussed that this could be just an effect of Mounjaro. CT chest abdomen and pelvis negative for any occult malignancy for now. Monitor clinically outpatient.  HLD. On Zetia  and statin. For now on hold.  Hyponatremia. clinically nonsignificant.  Macrocytosis. For now we will monitor.   Subjective: Seen after the surgery.  No nausea no vomiting no fever  no chills.  Physical Exam: Clear to auscultation. Bowel sound present No edema. Diffuse abdominal tenderness.  Data Reviewed: I have Reviewed nursing notes, Vitals, and Lab results. Since last encounter, pertinent lab results CBC and CMP   . I have ordered test including CBC and CMP  .   Disposition: Status is: Inpatient Remains inpatient appropriate because: Monitor for postop recovery.  enoxaparin  (LOVENOX ) injection 40 mg Start: 03/11/24 1400 SCDs Start: 03/10/24 2200  Family Communication: No one at bedside Level of care: Med-Surg   Vitals:   03/14/24 0950 03/14/24 0959 03/14/24 1010 03/14/24 1506  BP: 136/76  (!) 147/83 134/85  Pulse: 76 77 74 80  Resp: 17 16 19 17   Temp:  97.6 F (36.4 C) (!) 97.5 F (36.4 C) 97.7 F (36.5 C)  TempSrc:   Oral Oral  SpO2: 92% 93% 95% 98%  Weight:      Height:         Author: Yetta Blanch, MD 03/14/2024 6:05 PM  Please look on [email or web address] to find out who is on call.

## 2024-03-14 NOTE — Transfer of Care (Signed)
 Immediate Anesthesia Transfer of Care Note  Patient: Jerry [name]  Procedure(s) Performed: LAPAROSCOPIC CHOLECYSTECTOMY (Abdomen) INDOCYANINE GREEN  FLUORESCENCE IMAGING (ICG)  Patient Location: PACU  Anesthesia Type:General  Level of Consciousness: awake, alert , oriented, and patient cooperative  Airway & Oxygen Therapy: Patient Spontanous Breathing and Patient connected to face mask oxygen  Post-op Assessment: Report given to RN, Post -op Vital signs reviewed and stable, Patient moving all extremities, Patient moving all extremities X 4, and Patient able to stick tongue midline  Post vital signs: Reviewed and stable  Last Vitals:  Vitals Value Taken Time  BP    Temp    Pulse 75 03/14/24 09:22  Resp 18 03/14/24 09:22  SpO2 97 % 03/14/24 09:22  Vitals shown include unfiled device data.  Last Pain:  Vitals:   03/14/24 0548  TempSrc: Oral  PainSc:       Patients Stated Pain Goal: 0 (03/13/24 2001)  Complications: No notable events documented.

## 2024-03-15 ENCOUNTER — Encounter (HOSPITAL_COMMUNITY): Payer: Self-pay | Admitting: Surgery

## 2024-03-15 DIAGNOSIS — K810 Acute cholecystitis: Secondary | ICD-10-CM | POA: Diagnosis not present

## 2024-03-15 LAB — CBC WITH DIFFERENTIAL/PLATELET
Abs Immature Granulocytes: 0.11 K/uL — ABNORMAL HIGH (ref 0.00–0.07)
Basophils Absolute: 0 K/uL (ref 0.0–0.1)
Basophils Relative: 0 %
Eosinophils Absolute: 0 K/uL (ref 0.0–0.5)
Eosinophils Relative: 0 %
HCT: 37 % — ABNORMAL LOW (ref 39.0–52.0)
Hemoglobin: 13.5 g/dL (ref 13.0–17.0)
Immature Granulocytes: 1 %
Lymphocytes Relative: 14 %
Lymphs Abs: 1.9 K/uL (ref 0.7–4.0)
MCH: 35.7 pg — ABNORMAL HIGH (ref 26.0–34.0)
MCHC: 36.5 g/dL — ABNORMAL HIGH (ref 30.0–36.0)
MCV: 97.9 fL (ref 80.0–100.0)
Monocytes Absolute: 1.8 K/uL — ABNORMAL HIGH (ref 0.1–1.0)
Monocytes Relative: 13 %
Neutro Abs: 9.4 K/uL — ABNORMAL HIGH (ref 1.7–7.7)
Neutrophils Relative %: 72 %
Platelets: 269 K/uL (ref 150–400)
RBC: 3.78 MIL/uL — ABNORMAL LOW (ref 4.22–5.81)
RDW: 11.9 % (ref 11.5–15.5)
WBC: 13.2 K/uL — ABNORMAL HIGH (ref 4.0–10.5)
nRBC: 0 % (ref 0.0–0.2)

## 2024-03-15 LAB — COMPREHENSIVE METABOLIC PANEL WITH GFR
ALT: 60 U/L — ABNORMAL HIGH (ref 0–44)
AST: 39 U/L (ref 15–41)
Albumin: 2.6 g/dL — ABNORMAL LOW (ref 3.5–5.0)
Alkaline Phosphatase: 61 U/L (ref 38–126)
Anion gap: 10 (ref 5–15)
BUN: <5 mg/dL — ABNORMAL LOW (ref 8–23)
CO2: 24 mmol/L (ref 22–32)
Calcium: 8.9 mg/dL (ref 8.9–10.3)
Chloride: 98 mmol/L (ref 98–111)
Creatinine, Ser: 0.73 mg/dL (ref 0.61–1.24)
GFR, Estimated: >60 mL/min (ref >60)
Glucose, Bld: 127 mg/dL — ABNORMAL HIGH (ref 70–99)
Potassium: 4.1 mmol/L (ref 3.5–5.1)
Sodium: 132 mmol/L — ABNORMAL LOW (ref 135–145)
Total Bilirubin: 0.8 mg/dL (ref 0.0–1.2)
Total Protein: 6 g/dL — ABNORMAL LOW (ref 6.5–8.1)

## 2024-03-15 LAB — CULTURE, BLOOD (ROUTINE X 2)
Culture: NO GROWTH
Culture: NO GROWTH
Special Requests: ADEQUATE
Special Requests: ADEQUATE

## 2024-03-15 LAB — GLUCOSE, CAPILLARY
Glucose-Capillary: 134 mg/dL — ABNORMAL HIGH (ref 70–99)
Glucose-Capillary: 92 mg/dL (ref 70–99)
Glucose-Capillary: 109 mg/dL — ABNORMAL HIGH (ref 70–99)
Glucose-Capillary: 130 mg/dL — ABNORMAL HIGH (ref 70–99)

## 2024-03-15 LAB — MAGNESIUM: Magnesium: 1.6 mg/dL — ABNORMAL LOW (ref 1.7–2.4)

## 2024-03-15 MED ORDER — ENOXAPARIN SODIUM 40 MG/0.4ML IJ SOSY
40.0000 mg | PREFILLED_SYRINGE | INTRAMUSCULAR | Status: DC
  Administered 2024-03-15: 40 mg via SUBCUTANEOUS
  Filled 2024-03-15: qty 0.4

## 2024-03-15 MED ORDER — CLOPIDOGREL BISULFATE 75 MG PO TABS
75.0000 mg | ORAL_TABLET | Freq: Every day | ORAL | Status: DC
  Administered 2024-03-15 – 2024-03-16 (×2): 75 mg via ORAL
  Filled 2024-03-15 (×2): qty 1

## 2024-03-15 MED ORDER — MAGNESIUM SULFATE 2 GM/50ML IV SOLN
2.0000 g | Freq: Once | INTRAVENOUS | Status: AC
  Administered 2024-03-15: 2 g via INTRAVENOUS
  Filled 2024-03-15: qty 50

## 2024-03-15 MED ORDER — ALUM & MAG HYDROXIDE-SIMETH 200-200-20 MG/5ML PO SUSP
15.0000 mL | ORAL | Status: DC | PRN
  Administered 2024-03-15 (×2): 15 mL via ORAL
  Filled 2024-03-15 (×2): qty 30

## 2024-03-15 MED ORDER — STUDY - OCEANIC-STROKE - ASUNDEXIAN 50 MG OR PLACEBO TABLET (PI-SETHI)
1.0000 | ORAL_TABLET | Freq: Every day | ORAL | Status: DC
  Filled 2024-03-15: qty 1

## 2024-03-15 MED ORDER — ASPIRIN 81 MG PO TBEC
81.0000 mg | DELAYED_RELEASE_TABLET | Freq: Every day | ORAL | Status: DC
  Administered 2024-03-15 – 2024-03-16 (×2): 81 mg via ORAL
  Filled 2024-03-15 (×2): qty 1

## 2024-03-15 MED ORDER — BISACODYL 10 MG RE SUPP
10.0000 mg | Freq: Once | RECTAL | Status: DC
Start: 2024-03-15 — End: 2024-03-16
  Filled 2024-03-15: qty 1

## 2024-03-15 MED ORDER — SENNOSIDES-DOCUSATE SODIUM 8.6-50 MG PO TABS
1.0000 | ORAL_TABLET | Freq: Two times a day (BID) | ORAL | Status: DC
  Administered 2024-03-15 – 2024-03-16 (×3): 1 via ORAL
  Filled 2024-03-15 (×3): qty 1

## 2024-03-15 NOTE — Plan of Care (Signed)
  Problem: Education: Goal: Knowledge of General Education information will improve Description Including pain rating scale, medication(s)/side effects and non-pharmacologic comfort measures Outcome: Progressing   Problem: Health Behavior/Discharge Planning: Goal: Ability to manage health-related needs will improve Outcome: Progressing   Problem: Clinical Measurements: Goal: Ability to maintain clinical measurements within normal limits will improve Outcome: Progressing Goal: Will remain free from infection Outcome: Progressing   Problem: Activity: Goal: Risk for activity intolerance will decrease Outcome: Progressing   Problem: Nutrition: Goal: Adequate nutrition will be maintained Outcome: Progressing   Problem: Elimination: Goal: Will not experience complications related to bowel motility Outcome: Progressing Goal: Will not experience complications related to urinary retention Outcome: Progressing

## 2024-03-15 NOTE — Progress Notes (Signed)
 Progress Note  1 Day Post-Op  Subjective: Patient having abdominal pain/soreness, but feels this is manageable. Patient reports some nausea and one episode of vomiting this morning. Has not tried food intake. Passing flatus. Denies bowel movement since surgery. Requesting stool softer or laxative to help with bowel movement as he takes these routinely at home.   ROS  All negative with the exception of above.   Objective: Vital signs in last 24 hours: Temp:  [97.7 F (36.5 C)-98.6 F (37 C)] 98.5 F (36.9 C) (09/08 0730) Pulse Rate:  [75-80] 75 (09/08 0730) Resp:  [16-18] 18 (09/08 0730) BP: (119-151)/(77-92) 119/77 (09/08 0730) SpO2:  [97 %-99 %] 99 % (09/08 0730) Last BM Date : 03/12/24  Intake/Output from previous day: 09/07 0701 - 09/08 0700 In: 1220 [P.O.:120; I.V.:1000; IV Piggyback:100] Out: 1970 [Urine:1700; Drains:270] Intake/Output this shift: Total I/O In: 240 [P.O.:240] Out: -   PE: General: Pleasant male who is laying in bed in NAD. Lungs: Respiratory effort nonlabored Abd: Soft with mild distention. Generalized tenderness to palpation. Incisions C/D/I. Right sided JP drain noted with minimal brown thin fluid in bulb.  Skin: Warm and dry with no masses, lesions, or rashes Psych: A&Ox3 with an appropriate affect.    Lab Results:  Recent Labs    03/14/24 0422 03/15/24 0706  WBC 7.9 13.2*  HGB 13.4 13.5  HCT 36.8* 37.0*  PLT 224 269   BMET Recent Labs    03/14/24 0422 03/15/24 0706  NA 133* 132*  K 3.7 4.1  CL 102 98  CO2 20* 24  GLUCOSE 94 127*  BUN <5* <5*  CREATININE 0.72 0.73  CALCIUM  8.4* 8.9   PT/INR No results for input(s): LABPROT, INR in the last 72 hours. CMP     Component Value Date/Time   NA 132 (L) 03/15/2024 0706   NA 141 02/05/2022 1431   K 4.1 03/15/2024 0706   CL 98 03/15/2024 0706   CO2 24 03/15/2024 0706   GLUCOSE 127 (H) 03/15/2024 0706   BUN <5 (L) 03/15/2024 0706   BUN 12 02/05/2022 1431   CREATININE  0.73 03/15/2024 0706   CREATININE 0.82 08/23/2014 1153   CALCIUM  8.9 03/15/2024 0706   PROT 6.0 (L) 03/15/2024 0706   ALBUMIN 2.6 (L) 03/15/2024 0706   AST 39 03/15/2024 0706   ALT 60 (H) 03/15/2024 0706   ALKPHOS 61 03/15/2024 0706   BILITOT 0.8 03/15/2024 0706   GFRNONAA >60 03/15/2024 0706   GFRAA >60 11/06/2016 0435   Lipase     Component Value Date/Time   LIPASE 14 03/10/2024 1219       Studies/Results: No results found.  Anti-infectives: Anti-infectives (From admission, onward)    Start     Dose/Rate Route Frequency Ordered Stop   03/13/24 1000  piperacillin -tazobactam (ZOSYN ) IVPB 3.375 g       Note to Pharmacy: Please dose.   3.375 g 12.5 mL/hr over 240 Minutes Intravenous Every 8 hours 03/13/24 0922     03/11/24 0015  piperacillin -tazobactam (ZOSYN ) IVPB 3.375 g  Status:  Discontinued       Note to Pharmacy: Please dose.   3.375 g 12.5 mL/hr over 240 Minutes Intravenous Every 8 hours 03/11/24 0005 03/13/24 0922   03/10/24 1430  piperacillin -tazobactam (ZOSYN ) IVPB 3.375 g        3.375 g 100 mL/hr over 30 Minutes Intravenous  Once 03/10/24 1426 03/10/24 1600        Assessment/Plan POD1: S/P Laparoscopic subtotal fenestrating cholecystectomy  on 03/14/2024 by Dr. Leonor Dawn. -Afebrile. Vitals stable -WBC 13.2 from 7.9 -HGB 13.5. Stable -AST 39 from 92; ALT 60 from 82; Alk Phos 61 from 70; Tbili 0.8 from 1.1 - Having flatus. No BM since surgery. Provided dulcolax as patient has a bowel regimen at home. -Had 1 episode of emesis. Will place patient on CLD. -Total JP drain output recorded was 270. Minimal amount of output noted during encounter (brown, thin). -Will continue to follow   FEN: CLD;  VTE: SCDs; Aspirin , Plavix  ID: [email or web address]     LOS: 5 days   I reviewed specialist notes, hospitalist notes, last 24 h vitals and pain scores, last 48 h intake and output, last 24 h labs and trends, and last 24 h imaging results.   Marjorie Carlyon Favre, [facility] 03/15/2024, 11:19 AM Please see Amion for pager number during day hours 7:00am-4:30pm

## 2024-03-15 NOTE — Progress Notes (Signed)
 [facility] Progress Note Patient: Jerry [name] FMW:[id number] DOB: 02/05/1956  DOA: 03/10/2024 DOS: the patient was seen and examined on 03/15/2024  Brief Hospital Course: Patient with PMH of HTN, type II DM, HLD, cryptogenic stroke, ILR in situ. Presented the hospital with complaints of dizziness and fatigue. He also had episodes of vomiting on a daily basis for last 3 days. Workup in the ED shows evidence of possible cholecystitis. General surgery consulted. Underwent lap chole on 9/7.  Assessment and Plan: Acute calculous cholecystitis. Status post lap cholecystectomy 9/7. CT abdomen shows evidence of gallbladder wall thickening with pericholecystic stranding. Receiving IV antibiotics. Underwent surgery. Has a JP drain.  Monitoring for improvement in JP drain output.  On clear liquid diet due to episodes of vomiting. Monitor postop recovery.  History of cryptogenic stroke. Patient is on aspirin  81 mg, Plavix  75 mg as well as [employer] medication asundexian.  Currently on medications are on hold. Last dose of this medication was on Wednesday morning. Recommendation from neurology is to hold asundexian for 48 to 72 hours before performing surgery. Last ILR evaluation did not show any evidence of arrhythmia or pause. Resuming aspirin  and Plavix  for now.  Family did not bring research medication.  Currently on Lovenox  for DVT prophy X.  HTN. Blood pressure stable. On Norvasc .  For now continue.  Type 2 diabetes mellitus, controlled with long-term insulin  use with HLD and stroke Recent hemoglobin A1c 3.6. Currently on sliding scale insulin . For now monitor.  Weight loss. Patient and the family reports that he has lost 70 pounds in last 1 year. Discussed that this could be just an effect of Mounjaro. CT chest abdomen and pelvis negative for any occult malignancy for now. Monitor clinically outpatient.  HLD. On Zetia  and statin. For now on  hold.  Hyponatremia. clinically nonsignificant.  Macrocytosis. For now we will monitor.   Subjective: No nausea no vomiting at the time of my evaluation.  Reported episode of vomiting with general surgery.  Reports ongoing unchanged abdominal pain after the surgery.  Physical Exam: Clear to auscultation. Bowel sound present.  Diffusely tender. No edema.  No new focal deficit.  Data Reviewed: I have Reviewed nursing notes, Vitals, and Lab results. Since last encounter, pertinent lab results CBC and BMP   . I have ordered test including CBC and CMP  . I have discussed pt's care plan and test results with general surgery  .   Disposition: Status is: Inpatient Remains inpatient appropriate because: Monitor for improvement in oral intake and drain output  enoxaparin  (LOVENOX ) injection 40 mg Start: 03/15/24 1915 SCDs Start: 03/10/24 2200   Family Communication: Family at bedside Level of care: Med-Surg   Vitals:   03/14/24 1954 03/15/24 0452 03/15/24 0730 03/15/24 1523  BP: (!) 151/92 (!) 150/81 119/77 105/74  Pulse: 80 80 75 80  Resp: 16 18 18 19   Temp: 98.6 F (37 C) 98.5 F (36.9 C) 98.5 F (36.9 C) 98 F (36.7 C)  TempSrc: Oral Oral Oral Oral  SpO2: 97% 99% 99% 97%  Weight:      Height:         Author: Yetta Blanch, MD 03/15/2024 6:30 PM  Please look on [email or web address] to find out who is on call.

## 2024-03-16 DIAGNOSIS — K810 Acute cholecystitis: Secondary | ICD-10-CM | POA: Diagnosis not present

## 2024-03-16 LAB — MAGNESIUM: Magnesium: 1.6 mg/dL — ABNORMAL LOW (ref 1.7–2.4)

## 2024-03-16 LAB — CBC WITH DIFFERENTIAL/PLATELET
Abs Immature Granulocytes: 0.12 K/uL — ABNORMAL HIGH (ref 0.00–0.07)
Basophils Absolute: 0.1 K/uL (ref 0.0–0.1)
Basophils Relative: 1 %
Eosinophils Absolute: 0.1 K/uL (ref 0.0–0.5)
Eosinophils Relative: 1 %
HCT: 35.2 % — ABNORMAL LOW (ref 39.0–52.0)
Hemoglobin: 12.7 g/dL — ABNORMAL LOW (ref 13.0–17.0)
Immature Granulocytes: 1 %
Lymphocytes Relative: 25 %
Lymphs Abs: 2.5 K/uL (ref 0.7–4.0)
MCH: 36.2 pg — ABNORMAL HIGH (ref 26.0–34.0)
MCHC: 36.1 g/dL — ABNORMAL HIGH (ref 30.0–36.0)
MCV: 100.3 fL — ABNORMAL HIGH (ref 80.0–100.0)
Monocytes Absolute: 1.5 K/uL — ABNORMAL HIGH (ref 0.1–1.0)
Monocytes Relative: 15 %
Neutro Abs: 5.5 K/uL (ref 1.7–7.7)
Neutrophils Relative %: 57 %
Platelets: 268 K/uL (ref 150–400)
RBC: 3.51 MIL/uL — ABNORMAL LOW (ref 4.22–5.81)
RDW: 12.2 % (ref 11.5–15.5)
WBC: 9.7 K/uL (ref 4.0–10.5)
nRBC: 0 % (ref 0.0–0.2)

## 2024-03-16 LAB — COMPREHENSIVE METABOLIC PANEL WITH GFR
ALT: 48 U/L — ABNORMAL HIGH (ref 0–44)
AST: 31 U/L (ref 15–41)
Albumin: 2.5 g/dL — ABNORMAL LOW (ref 3.5–5.0)
Alkaline Phosphatase: 53 U/L (ref 38–126)
Anion gap: 9 (ref 5–15)
BUN: 5 mg/dL — ABNORMAL LOW (ref 8–23)
CO2: 23 mmol/L (ref 22–32)
Calcium: 8.6 mg/dL — ABNORMAL LOW (ref 8.9–10.3)
Chloride: 104 mmol/L (ref 98–111)
Creatinine, Ser: 0.69 mg/dL (ref 0.61–1.24)
GFR, Estimated: >60 mL/min (ref >60)
Glucose, Bld: 116 mg/dL — ABNORMAL HIGH (ref 70–99)
Potassium: 3.4 mmol/L — ABNORMAL LOW (ref 3.5–5.1)
Sodium: 136 mmol/L (ref 135–145)
Total Bilirubin: 0.8 mg/dL (ref 0.0–1.2)
Total Protein: 5.7 g/dL — ABNORMAL LOW (ref 6.5–8.1)

## 2024-03-16 LAB — SURGICAL PATHOLOGY

## 2024-03-16 LAB — GLUCOSE, CAPILLARY
Glucose-Capillary: 105 mg/dL — ABNORMAL HIGH (ref 70–99)
Glucose-Capillary: 75 mg/dL (ref 70–99)

## 2024-03-16 MED ORDER — HYDROMORPHONE HCL 1 MG/ML IJ SOLN: 0.5000 mg | INTRAMUSCULAR | Status: DC | PRN

## 2024-03-16 MED ORDER — ACETAMINOPHEN 325 MG PO TABS: 650.0000 mg | ORAL_TABLET | Freq: Four times a day (QID) | ORAL | Status: DC

## 2024-03-16 MED ORDER — MAGNESIUM SULFATE 2 GM/50ML IV SOLN
2.0000 g | Freq: Once | INTRAVENOUS | Status: AC
  Administered 2024-03-16: 2 g via INTRAVENOUS
  Filled 2024-03-16: qty 50

## 2024-03-16 MED ORDER — EZETIMIBE 10 MG PO TABS: 10.0000 mg | ORAL_TABLET | Freq: Every day | ORAL | Status: DC

## 2024-03-16 MED ORDER — SACCHAROMYCES BOULARDII 250 MG PO CAPS: 250.0000 mg | ORAL_CAPSULE | Freq: Two times a day (BID) | ORAL | 0 refills | Status: AC

## 2024-03-16 MED ORDER — BISACODYL 10 MG RE SUPP: 10.0000 mg | Freq: Every day | RECTAL | 0 refills | Status: AC | PRN

## 2024-03-16 MED ORDER — ACETAMINOPHEN 325 MG PO TABS: 650.0000 mg | ORAL_TABLET | Freq: Four times a day (QID) | ORAL | Status: DC | PRN

## 2024-03-16 MED ORDER — POTASSIUM CHLORIDE CRYS ER 20 MEQ PO TBCR
40.0000 meq | EXTENDED_RELEASE_TABLET | ORAL | Status: AC
  Administered 2024-03-16 (×2): 40 meq via ORAL
  Filled 2024-03-16 (×2): qty 2

## 2024-03-16 MED ORDER — ROSUVASTATIN CALCIUM 40 MG PO TABS: 40.0000 mg | ORAL_TABLET | Freq: Every day | ORAL | Status: AC

## 2024-03-16 MED ORDER — SENNOSIDES-DOCUSATE SODIUM 8.6-50 MG PO TABS: 1.0000 | ORAL_TABLET | Freq: Two times a day (BID) | ORAL | 0 refills | Status: AC

## 2024-03-16 MED ORDER — OXYCODONE HCL 5 MG PO TABS: 5.0000 mg | ORAL_TABLET | ORAL | 0 refills | Status: AC | PRN

## 2024-03-16 MED ORDER — AMOXICILLIN-POT CLAVULANATE 875-125 MG PO TABS: 1.0000 | ORAL_TABLET | Freq: Two times a day (BID) | ORAL | 0 refills | Status: AC

## 2024-03-16 MED ORDER — OXYCODONE HCL 5 MG PO TABS
5.0000 mg | ORAL_TABLET | ORAL | Status: DC | PRN
  Administered 2024-03-16: 5 mg via ORAL
  Filled 2024-03-16: qty 1

## 2024-03-16 NOTE — Progress Notes (Signed)
 Patient and wife expressed understanding of discharge POC and education regarding JP drain.  PIV removed.  JP drained by RN. Discharge RN changed dressing to site. site unremarkable.  Transferred to MAIN A.

## 2024-03-16 NOTE — TOC Transition Note (Signed)
 Transition of Care [facility]) - Discharge Note   Patient Details  Name: Jerry [name] MRN: [id number] Date of Birth: 03-May-1956  Transition of Care [facility]) CM/SW Contact:  Rosalva Jon Bloch, RN Phone Number: 03/16/2024, 2:14 PM   Clinical Narrative:    Patient will DC to: Home Anticipated DC date: 03/16/2024 Family notified: yes Transport by: car  Per MD patient ready for DC today . RN, patient, and patient's wife notified of DC.  Order noted for home health services, pt agreeable. Pt without provider preference. Referral made with [facility] and accepted. Pt with no DME needs, Wife to provide transportation to home. Post hospital f/u noted on AVS.  Pt without RX med concerns... Pt's home address: [address], [address], [place] [address] [address]) RNCM will sign off for now as intervention is no longer needed. Please consult us  again if new needs arise.   [address], [place] [address] [address]  Final next level of care: Home w Home Health Services Barriers to Discharge: No Barriers Identified   Patient Goals and CMS Choice     Choice offered to / list presented to : Patient      Discharge Placement                       Discharge Plan and Services Additional resources added to the After Visit Summary for                            [facility] Arranged: RN [facility] Agency: [facility] Date [facility] Agency Contacted: 03/16/24 Time HH Agency Contacted: 1414 Representative spoke with at [facility] Agency: [place]  Social Drivers of Health (SDOH) Interventions SDOH Screenings   Food Insecurity: No Food Insecurity (03/10/2024)  Housing: Low Risk  (03/10/2024)  Transportation Needs: No Transportation Needs (03/10/2024)  Utilities: Not At Risk (03/10/2024)  Depression (PHQ2-9): Medium Risk (03/02/2024)  Social Connections: Socially Integrated (03/10/2024)  Tobacco Use: Medium Risk (03/14/2024)     Readmission Risk Interventions     No data to display

## 2024-03-16 NOTE — Discharge Instructions (Signed)

## 2024-03-16 NOTE — Progress Notes (Signed)
 Progress Note  2 Days Post-Op  Subjective: Patient reports some abdominal soreness but overall doing well. Passing flatus and denies further nausea or vomiting. Wife at bedside also. Explained subtotal cholecystectomy and reasoning for keeping drain in place with patient who verbalized understanding. Patient reports he feels comfortable with emptying drain.   Objective: Vital signs in last 24 hours: Temp:  [98 F (36.7 C)-98.9 F (37.2 C)] 98.5 F (36.9 C) (09/09 0740) Pulse Rate:  [74-91] 86 (09/09 0740) Resp:  [16-19] 16 (09/09 0740) BP: (105-160)/(66-87) 114/66 (09/09 0740) SpO2:  [94 %-97 %] 94 % (09/09 0740) Last BM Date : 03/14/24  Intake/Output from previous day: 09/08 0701 - 09/09 0700 In: 969.8 [P.O.:700; IV Piggyback:269.8] Out: 360 [Urine:300; Drains:60] Intake/Output this shift: Total I/O In: -  Out: 80 [Drains:80]  PE: General: Pleasant male who is laying in bed in NAD. Lungs: Respiratory effort nonlabored Abd: Soft with mild distention. Appropriately TTP. Incisions C/D/I. Right sided JP drain with small amount bile tinged fluid  Skin: Warm and dry with no masses, lesions, or rashes Psych: A&Ox3 with an appropriate affect.    Lab Results:  Recent Labs    03/15/24 0706 03/16/24 0457  WBC 13.2* 9.7  HGB 13.5 12.7*  HCT 37.0* 35.2*  PLT 269 268   BMET Recent Labs    03/15/24 0706 03/16/24 0457  NA 132* 136  K 4.1 3.4*  CL 98 104  CO2 24 23  GLUCOSE 127* 116*  BUN <5* 5*  CREATININE 0.73 0.69  CALCIUM  8.9 8.6*   PT/INR No results for input(s): LABPROT, INR in the last 72 hours. CMP     Component Value Date/Time   NA 136 03/16/2024 0457   NA 141 02/05/2022 1431   K 3.4 (L) 03/16/2024 0457   CL 104 03/16/2024 0457   CO2 23 03/16/2024 0457   GLUCOSE 116 (H) 03/16/2024 0457   BUN 5 (L) 03/16/2024 0457   BUN 12 02/05/2022 1431   CREATININE 0.69 03/16/2024 0457   CREATININE 0.82 08/23/2014 1153   CALCIUM  8.6 (L) 03/16/2024 0457    PROT 5.7 (L) 03/16/2024 0457   ALBUMIN 2.5 (L) 03/16/2024 0457   AST 31 03/16/2024 0457   ALT 48 (H) 03/16/2024 0457   ALKPHOS 53 03/16/2024 0457   BILITOT 0.8 03/16/2024 0457   GFRNONAA >60 03/16/2024 0457   GFRAA >60 11/06/2016 0435   Lipase     Component Value Date/Time   LIPASE 14 03/10/2024 1219       Studies/Results: No results found.  Anti-infectives: Anti-infectives (From admission, onward)    Start     Dose/Rate Route Frequency Ordered Stop   03/16/24 0000  amoxicillin -clavulanate (AUGMENTIN ) 875-125 MG tablet        1 tablet Oral 2 times daily 03/16/24 1040 03/20/24 2359   03/13/24 1000  piperacillin -tazobactam (ZOSYN ) IVPB 3.375 g       Note to Pharmacy: Please dose.   3.375 g 12.5 mL/hr over 240 Minutes Intravenous Every 8 hours 03/13/24 0922     03/11/24 0015  piperacillin -tazobactam (ZOSYN ) IVPB 3.375 g  Status:  Discontinued       Note to Pharmacy: Please dose.   3.375 g 12.5 mL/hr over 240 Minutes Intravenous Every 8 hours 03/11/24 0005 03/13/24 0922   03/10/24 1430  piperacillin -tazobactam (ZOSYN ) IVPB 3.375 g        3.375 g 100 mL/hr over 30 Minutes Intravenous  Once 03/10/24 1426 03/10/24 1600  Assessment/Plan POD2 S/P Laparoscopic subtotal fenestrating cholecystectomy on 03/14/2024 by Dr. Leonor Dawn. - Afebrile. Vitals stable - WBC normalized and LFTs continue to improve - HGB 12.7 from 13.5, drain is not particularly bloody - tolerating HH diet  - JP output is bile tinged but low volume - would recommend DC home with drain and have arranged outpatient follow up for drain check next week - stable for DC home from surgery standpoint, recommend 4 more days PO abx which I have sent along with pain Rx. Reviewed discharge instructions and follow up with patient and wife and put instructions/follow up in AVS.  FEN: HH diet  VTE: SCDs; Aspirin , Plavix  ID: [email or web address]     LOS: 6 days     [place] JONELLE Louder, [facility]  Surgery 03/16/2024, 10:41 AM Please see Amion for pager number during day hours 7:00am-4:30pm

## 2024-03-18 ENCOUNTER — Encounter (HOSPITAL_COMMUNITY): Admitting: Psychiatry

## 2024-03-18 NOTE — Telephone Encounter (Signed)
 Live Answer: When patient returns call, please communicate the follow information:  Live Answer can communication the following information:   Left a message for the patient to call back to schedule a hospital follow up. Please transfer to [id number]. Thank You

## 2024-03-18 NOTE — Discharge Summary (Signed)
 Physician Discharge Summary   Patient: Jerry [name] MRN: [id number] DOB: [birth year]  Admit date:     03/10/2024  Discharge date: 03/16/2024  Discharge Physician: Yetta Blanch  PCP: Lazoff, Shawn P, DO  Recommendations at discharge: Follow-up with PCP in 1 week. Follow-up with general surgery as recommended.    Follow-up Information     Lazoff, Shawn P, DO Follow up.   Specialty: Family Medicine Contact information: [address] [place] [address] [phone]         Dasie Leonor CROME, MD. Go on 03/23/2024.   Specialty: General Surgery Why: 3 PM for drain check and possible removal. Please arrive 30 min prior to appointment time for check in. Contact information: [address] [place] [address] [phone]         Lazoff, Shawn P, DO. Schedule an appointment as soon as possible for a visit in 1 week(s).   Specialty: Family Medicine Why: with BMP lab to look at kidney/electrolyte numbers, with CBC lab to look at blood counts Contact information: [address] [place] [address] [phone]         [facility] Follow up.   Specialty: Home Health Services Why: home health services will be provided by [facility], start of care  within 48 hours post discharge Contact information: [address] [place] [address] [phone]                Hospital Course: Patient with PMH of HTN, type II DM, HLD, cryptogenic stroke, ILR in situ. Presented the hospital with complaints of dizziness and fatigue. He also had episodes of vomiting on a daily basis for last 3 days. Workup in the ED shows evidence of possible cholecystitis. General surgery consulted. Underwent lap chole on 9/7.  Assessment and Plan: Acute calculous cholecystitis. Status post lap cholecystectomy 9/7. CT abdomen shows evidence of gallbladder wall thickening with pericholecystic stranding. Receiving IV antibiotics. Underwent  surgery. Has a JP drain.  Oral intake improving.  Pain control improving. Patient was going home with JP drain for general surgery with close follow-up.  History of cryptogenic stroke. Patient is on aspirin  81 mg, Plavix  75 mg as well as [employer] medication asundexian.  Currently on medications are on hold. Last dose of this medication was on Wednesday morning. Recommendation from neurology is to hold asundexian for 48 to 72 hours before performing surgery. Last ILR evaluation did not show any evidence of arrhythmia or pause. Resuming aspirin  and Plavix  for now.  Resume home medications  HTN. Blood pressure stable. On Norvasc .  For now continue.  Type 2 diabetes mellitus, controlled with long-term insulin  use with HLD and stroke Recent hemoglobin A1c 3.6. Currently on sliding scale insulin . Resume home medications  Weight loss. Patient and the family reports that he has lost 70 pounds in last 1 year. Discussed that this could be just an effect of Mounjaro. CT chest abdomen and pelvis negative for any occult malignancy for now. Monitor clinically outpatient.  HLD. On Zetia  and statin. For now resume.  Hyponatremia. clinically nonsignificant.  Macrocytosis. For now we will monitor.  Pain control - [place]  Controlled Substance Reporting System database was reviewed. and patient was instructed, not to drive, operate heavy machinery, perform activities at heights, swimming or participation in water activities or provide baby-sitting services while on Pain, Sleep and Anxiety Medications; until their outpatient Physician has advised to do so again. Also recommended to  not to take more than prescribed Pain, Sleep and Anxiety Medications.  Consultants:  General Surgery  Procedures performed:  Laparoscopic cholecystectomy with drain placement  DISCHARGE MEDICATION: Allergies as of 03/16/2024       Reactions   Diclofenac  Sodium Other (See Comments)   Gel caused a  burning sensation    Lisinopril  Cough   Losartan  Cough   Unknown bad side effects        Medication List     PAUSE taking these medications    HYDROcodone -acetaminophen  7.5-325 MG tablet Wait to take this until your doctor or other care provider tells you to start again. Commonly known as: NORCO Take 1-2 tablets by mouth daily as needed for moderate pain (pain score 4-6) or severe pain (pain score 7-10).   insulin  glargine 100 UNIT/ML Solostar Pen Wait to take this until your doctor or other care provider tells you to start again. Commonly known as: Lantus  SoloStar Inject 15 Units into the skin 2 (two) times daily.       TAKE these medications    acetaminophen  500 MG tablet Commonly known as: TYLENOL  Take 1,000 mg by mouth every 6 (six) hours as needed for mild pain.   albuterol 108 (90 Base) MCG/ACT inhaler Commonly known as: VENTOLIN HFA SMARTSIG:2 Puff(s) By Mouth Every 4 Hours PRN   amLODipine  10 MG tablet Commonly known as: NORVASC  Take 10 mg by mouth daily.   amoxicillin -clavulanate 875-125 MG tablet Commonly known as: AUGMENTIN  Take 1 tablet by mouth 2 (two) times daily for 4 days.   aspirin  EC 81 MG tablet Take 1 tablet (81 mg total) by mouth daily. Swallow whole.   bisacodyl  10 MG suppository Commonly known as: DULCOLAX Place 1 suppository (10 mg total) rectally daily as needed for moderate constipation.   clopidogrel  75 MG tablet Commonly known as: PLAVIX  Take 1 tablet (75 mg total) by mouth daily.   cyclobenzaprine 10 MG tablet Commonly known as: FLEXERIL Take 10 mg by mouth daily as needed for muscle spasms.   ezetimibe  10 MG tablet Commonly known as: ZETIA  Take 1 tablet (10 mg total) by mouth daily.   hydrOXYzine  25 MG capsule Commonly known as: VISTARIL  Take 1 capsule (25 mg total) by mouth 3 (three) times daily as needed for anxiety. What changed: when to take this   ibuprofen 200 MG tablet Commonly known as: ADVIL Take 200-400 mg  by mouth daily as needed for headache, moderate pain (pain score 4-6) or mild pain (pain score 1-3).   ketorolac  0.5 % ophthalmic solution Commonly known as: ACULAR  Place 1 drop into the right eye as needed (every 3 months after eye injection).   melatonin 5 MG Tabs Take 5 mg by mouth at bedtime as needed (sleep).   metFORMIN  1000 MG tablet Commonly known as: GLUCOPHAGE  Take 1 tablet (1,000 mg total) by mouth daily. What changed: when to take this   Mounjaro 15 MG/0.5ML Pen Generic drug: tirzepatide Inject 15 mg into the skin once a week. Mondays   OCEANIC-STROKE asundexian or placebo 50 mg tablet Take 1 tablet (50 mg total) by mouth daily. For Investigational Use Only. Take at the same time each day (preferably in the morning). Tablet should be swallowed whole with water; it CANNOT be crushed or broken. Please contact [facility] if you have any questions regarding this medication or study.   oxyCODONE  5 MG immediate release tablet Commonly known as: Oxy IR/ROXICODONE  Take 1 tablet (5 mg total) by mouth every 4 (four)  hours as needed for moderate pain (pain score 4-6) or severe pain (pain score 7-10).   rosuvastatin  40 MG tablet Commonly known as: CRESTOR  Take 1 tablet (40 mg total) by mouth daily.   saccharomyces boulardii 250 MG capsule Commonly known as: FLORASTOR Take 1 capsule (250 mg total) by mouth 2 (two) times daily for 7 days.   senna-docusate 8.6-50 MG tablet Commonly known as: Senokot-S Take 1 tablet by mouth 2 (two) times daily for 7 days.   sertraline  100 MG tablet Commonly known as: Zoloft  Take 2 tablets (200 mg total) by mouth daily. What changed:  how much to take when to take this   traZODone  100 MG tablet Commonly known as: DESYREL  Take 1 tablet (100 mg total) by mouth at bedtime as needed for sleep. What changed: when to take this   TUMS PO Take 2 tablets by mouth daily as needed (reflux).               Discharge Care  Instructions  (From admission, onward)           Start     Ordered   03/16/24 0000  Discharge wound care:       Comments: Drain management per General surgery recommendation   03/16/24 1307           Disposition: Home Diet recommendation: Cardiac diet  Discharge Exam: Vitals:   03/15/24 1915 03/16/24 0337 03/16/24 0740 03/16/24 1415  BP: (!) 141/80 (!) 160/87 114/66 111/71  Pulse: 74 91 86 72  Resp: 18 18 16    Temp: 98.5 F (36.9 C) 98.9 F (37.2 C) 98.5 F (36.9 C) 98.7 F (37.1 C)  TempSrc: Oral Oral Oral Oral  SpO2: 96% 97% 94% 97%  Weight:      Height:       Clear to auscultation. Bowel sound present. Mildly tender in the right upper quadrant area. No edema.  Filed Weights   03/10/24 2139  Weight: 103.3 kg   Condition at discharge: stable  The results of significant diagnostics from this hospitalization (including imaging, microbiology, ancillary and laboratory) are listed below for reference.   Imaging Studies: US  Abdomen Limited RUQ (LIVER/GB) Result Date: 03/10/2024 CLINICAL DATA:  Abdominal pain. EXAM: ULTRASOUND ABDOMEN LIMITED RIGHT UPPER QUADRANT COMPARISON:  CT scan of same day. FINDINGS: Gallbladder: Cholelithiasis is noted with moderate gallbladder wall thickening measured at 8 mm. No sonographic Murphy's sign is noted, but pericholecystic fluid is noted. Sludge is noted as well. Largest gallstone measures 5 mm. Probable 5 mm gallbladder polyp is noted as well. Common bile duct: Diameter: 5 mm which is within normal limits Liver: No focal lesion identified. Within normal limits in parenchymal echogenicity. Portal vein is patent on color Doppler imaging with normal direction of blood flow towards the liver. Other: None. IMPRESSION: 1. Cholelithiasis is noted with moderate gallbladder wall thickening and pericholecystic fluid. No sonographic Murphy's sign is noted. Findings are concerning for possible cholecystitis. HIDA scan may be performed for further  evaluation. 2. Probable 5 mm gallbladder polyp is noted. Electronically Signed   By: Lynwood Landy Raddle M.D.   On: 03/10/2024 15:49   CT ABDOMEN PELVIS W CONTRAST Result Date: 03/10/2024 CLINICAL DATA:  Abdominal pain EXAM: CT ABDOMEN AND PELVIS WITH CONTRAST TECHNIQUE: Multidetector CT imaging of the abdomen and pelvis was performed using the standard protocol following bolus administration of intravenous contrast. RADIATION DOSE REDUCTION: This exam was performed according to the departmental dose-optimization program which includes automated exposure control, adjustment  of the mA and/or kV according to patient size and/or use of iterative reconstruction technique. CONTRAST:  OMNIPAQUE  IOHEXOL  350 MG/ML SOLN COMPARISON:  CT angio chest March 10, 2024 on August 15, 2022 FINDINGS: Lower chest: Right lung base scarring/subsegmental atelectasis. Hepatobiliary: Hypoattenuating liver parenchyma consistent with hepatic steatosis. Focal fat deposition is segment 4 measuring 1.4 cm, typical location. Gallbladder wall thickening and pericholecystic and subhepatic mild fat stranding with associated cholelithiasis suggestive of acute infectious/inflammatory changes. No pericholecystic fluid collection. Pancreas: Mild atrophic changes of the pancreas. No pancreatic ductal dilatation or mass lesion. Spleen: Normal in size without focal abnormality. Adrenals/Urinary Tract: Simple renal cortical cysts in both kidneys which does not require imaging follow-up. Otherwise unremarkable. No hydronephrosis. Adrenal glands are unremarkable. Bladder is thick-walled and under distended. Stomach/Bowel: Stomach is within normal limits. Scattered colonic diverticula. No evidence of bowel wall thickening, distention, or inflammatory changes. Vascular/Lymphatic: No significant vascular findings are present. No enlarged abdominal or pelvic lymph nodes. Reproductive: Prostatomegaly. Other: Tiny fat containing paraumbilical hernia with a  defect measuring 7 mm. Bilateral fat containing inguinal hernias left greater than right. Musculoskeletal: Multilevel degenerative changes of the spine. IMPRESSION: Cholelithiasis with CT findings suggestive of acute cholecystitis. Correlate with clinical findings. Hepatic steatosis with focal fat in segment 4. Additional findings as above. Electronically Signed   By: Megan  Zare M.D.   On: 03/10/2024 14:51   CT Angio Chest PE W and/or Wo Contrast Addendum Date: 03/10/2024 ADDENDUM REPORT: 03/10/2024 14:26 ADDENDUM: Gallstones with gallbladder distension and inflammatory stranding surrounding the gallbladder concerning for acute cholecystitis. This could be further evaluated with right upper quadrant ultrasound. These results were called by telephone at the time of interpretation on 03/10/2024 at 2:25 pm to provider [facility] , who verbally acknowledged these results. Electronically Signed   By: Franky Crease M.D.   On: 03/10/2024 14:26   Result Date: 03/10/2024 CLINICAL DATA:  Pulmonary embolism (PE) suspected, high prob. Weakness, dizziness, vomiting EXAM: CT ANGIOGRAPHY CHEST WITH CONTRAST TECHNIQUE: Multidetector CT imaging of the chest was performed using the standard protocol during bolus administration of intravenous contrast. Multiplanar CT image reconstructions and MIPs were obtained to evaluate the vascular anatomy. RADIATION DOSE REDUCTION: This exam was performed according to the departmental dose-optimization program which includes automated exposure control, adjustment of the mA and/or kV according to patient size and/or use of iterative reconstruction technique. CONTRAST:  OMNIPAQUE  IOHEXOL  350 MG/ML SOLN COMPARISON:  08/15/2022 FINDINGS: Cardiovascular: Three-vessel coronary artery disease and aortic atherosclerosis. Heart is normal size. Aorta is normal caliber. No filling defects in the pulmonary arteries to suggest pulmonary emboli. Mediastinum/Nodes: No mediastinal, hilar, or axillary  adenopathy. Trachea and esophagus are unremarkable. Thyroid  unremarkable. Lungs/Pleura: Linear areas of atelectasis in the lower lobes bilaterally. Nodular area in the lingula is new since prior study and may also reflect atelectasis, measuring 12 mm. This warrants follow-up. No effusions. Upper Abdomen: Gallbladder is distended with layering gallstones. Surrounding inflammatory change. Findings concerning for acute cholecystitis. Musculoskeletal: Chest wall soft tissues are unremarkable. No acute bony abnormality. Review of the MIP images confirms the above findings. IMPRESSION: No evidence of pulmonary embolus. Three-vessel coronary artery disease. Lower lobe atelectasis. 12 mm nodular density in the lingula may also reflect atelectasis but recommend follow-up CT and 6 months to ensure resolution. Aortic Atherosclerosis (ICD10-I70.0). Electronically Signed: By: Franky Crease M.D. On: 03/10/2024 14:12   CUP PACEART REMOTE DEVICE CHECK Result Date: 03/04/2024 ILR summary report received. Battery status OK. Normal device function. No new  symptom, tachy, brady, or pause episodes. No new AF episodes. Monthly summary reports and ROV/PRN. MC, CVRS   Microbiology: Results for orders placed or performed during the hospital encounter of 03/10/24  Resp panel by RT-PCR (RSV, Flu A&B, Covid) Anterior Nasal Swab     Status: None   Collection Time: 03/10/24  1:01 PM   Specimen: Anterior Nasal Swab  Result Value Ref Range Status   SARS Coronavirus 2 by RT PCR NEGATIVE NEGATIVE Final    Comment: (NOTE) SARS-CoV-2 target nucleic acids are NOT DETECTED.  The SARS-CoV-2 RNA is generally detectable in upper respiratory specimens during the acute phase of infection. The lowest concentration of SARS-CoV-2 viral copies this assay can detect is 138 copies/mL. A negative result does not preclude SARS-Cov-2 infection and should not be used as the sole basis for treatment or other patient management decisions. A negative  result may occur with  improper specimen collection/handling, submission of specimen other than nasopharyngeal swab, presence of viral mutation(s) within the areas targeted by this assay, and inadequate number of viral copies(<138 copies/mL). A negative result must be combined with clinical observations, patient history, and epidemiological information. The expected result is Negative.  Fact Sheet for Patients:  BloggerCourse.com  Fact Sheet for Healthcare Providers:  SeriousBroker.it  This test is no t yet approved or cleared by the [place]  FDA and  has been authorized for detection and/or diagnosis of SARS-CoV-2 by FDA under an Emergency Use Authorization (EUA). This EUA will remain  in effect (meaning this test can be used) for the duration of the COVID-19 declaration under Section 564(b)(1) of the Act, 21 U.S.C.section 360bbb-3(b)(1), unless the authorization is terminated  or revoked sooner.       Influenza A by PCR NEGATIVE NEGATIVE Final   Influenza B by PCR NEGATIVE NEGATIVE Final    Comment: (NOTE) The Xpert Xpress SARS-CoV-2/FLU/RSV plus assay is intended as an aid in the diagnosis of influenza from Nasopharyngeal swab specimens and should not be used as a sole basis for treatment. Nasal washings and aspirates are unacceptable for Xpert Xpress SARS-CoV-2/FLU/RSV testing.  Fact Sheet for Patients: BloggerCourse.com  Fact Sheet for Healthcare Providers: SeriousBroker.it  This test is not yet approved or cleared by the [place]  FDA and has been authorized for detection and/or diagnosis of SARS-CoV-2 by FDA under an Emergency Use Authorization (EUA). This EUA will remain in effect (meaning this test can be used) for the duration of the COVID-19 declaration under Section 564(b)(1) of the Act, 21 U.S.C. section 360bbb-3(b)(1), unless the authorization is  terminated or revoked.     Resp Syncytial Virus by PCR NEGATIVE NEGATIVE Final    Comment: (NOTE) Fact Sheet for Patients: BloggerCourse.com  Fact Sheet for Healthcare Providers: SeriousBroker.it  This test is not yet approved or cleared by the [place]  FDA and has been authorized for detection and/or diagnosis of SARS-CoV-2 by FDA under an Emergency Use Authorization (EUA). This EUA will remain in effect (meaning this test can be used) for the duration of the COVID-19 declaration under Section 564(b)(1) of the Act, 21 U.S.C. section 360bbb-3(b)(1), unless the authorization is terminated or revoked.  Performed at [facility], [address] [place], [place] [address]   Blood culture (routine x 2)     Status: None   Collection Time: 03/10/24  3:09 PM   Specimen: BLOOD  Result Value Ref Range Status   Specimen Description   Final    BLOOD BLOOD RIGHT FOREARM Performed at [facility], [address] [place], [place] [address]    Special Requests   Final    BOTTLES DRAWN AEROBIC AND ANAEROBIC Blood Culture adequate volume Performed at [facility], [address] [place], [place] [address]    Culture   Final    NO GROWTH 5 DAYS Performed at [facility], [address] [place], [place] [address]    Report Status 03/15/2024 FINAL  Final  Blood culture (routine x 2)     Status: None   Collection Time: 03/10/24  3:10 PM   Specimen: BLOOD  Result Value Ref Range Status   Specimen Description   Final    BLOOD BLOOD LEFT FOREARM Performed at [facility], [address], [place], [place] [address]    Special Requests   Final    BOTTLES DRAWN AEROBIC ONLY Blood Culture adequate volume Performed at [facility], [address] [place], [place] [address]    Culture   Final    NO GROWTH 5 DAYS Performed at [facility], [address] [place], [place] [address]    Report Status 03/15/2024 FINAL  Final   Labs: CBC: Recent Labs  Lab 03/12/24 0442 03/13/24 0621 03/14/24 0422 03/15/24 0706 03/16/24 0457  WBC 15.6* 10.5 7.9 13.2* 9.7  NEUTROABS 12.5* 7.5 5.1 9.4* 5.5  HGB 13.8 14.1 13.4 13.5 12.7*  HCT 37.9* 39.5 36.8* 37.0* 35.2*  MCV 100.0 100.5* 99.2 97.9 100.3*  PLT 202 243 224 269 268   Basic Metabolic Panel: Recent Labs  Lab 03/12/24 0442 03/13/24 0621 03/14/24 0422 03/15/24 0706 03/16/24 0457  NA 132* 135 133* 132* 136  K 3.2* 3.4* 3.7 4.1 3.4*  CL 99 100 102 98 104  CO2 25 23 20* 24 23  GLUCOSE 91 92 94 127* 116*  BUN 6* <5* <5* <5* 5*  CREATININE 0.83 0.75 0.72 0.73 0.69  CALCIUM  8.7* 8.6* 8.4* 8.9 8.6*  MG 1.5* 1.6* 1.7 1.6* 1.6*   Liver Function Tests: Recent Labs  Lab 03/12/24 0442 03/13/24 0621 03/14/24 0422 03/15/24 0706 03/16/24 0457  AST 96* 93* 92* 39 31  ALT 57* 66* 82* 60* 48*  ALKPHOS 82 80 70 61 53  BILITOT 2.0* 1.5* 1.1 0.8 0.8  PROT 6.0* 6.2* 5.8* 6.0* 5.7*  ALBUMIN 2.6* 2.6* 2.5* 2.6* 2.5*   CBG: Recent Labs  Lab 03/15/24 1118 03/15/24 1608 03/15/24 2113 03/16/24 0553 03/16/24 1108  GLUCAP 92 109* 130* 105* 75    Discharge time spent: greater than 30 minutes.  Author: Yetta Blanch, MD  [facility] 03/16/2024

## 2024-03-18 NOTE — Progress Notes (Signed)
 Remote Loop Recorder Transmission

## 2024-03-18 NOTE — Progress Notes (Signed)
Left a message for the patient to call back to schedule.

## 2024-03-19 NOTE — Telephone Encounter (Signed)
 Med was sent

## 2024-03-19 NOTE — Telephone Encounter (Signed)
 Copied from CRM #[id number]. Topic: Medication/Rx - Rx Refill >> Mar 19, 2024 12:30 PM Sari DASEN wrote: Jerry, [name] is calling for medication request (Obtain: Medication Name, Dosage, Quantity, Frequency, Quantity Requested (example: 30-day supply.)   Include all details related to the request(s) below:  Paient wife called and scheduled a HOS follow up 10/03, she was advised that a new prescription for clopidogreL  (PLAVIX ) 75 mg tablet  is needed, he is completely out    Confirm and type the Best Contact Number below:  Patient/caller contact number:          [phone]   [] Home  [] Mobile  [] Work [] Other   [] Okay to leave a voicemail   Medication List:  Current Outpatient Medications:  .  amLODIPine  (NORVASC ) 10 mg tablet, Take 1 tablet (10 mg total) by mouth daily., Disp: 90 tablet, Rfl: 3 .  amoxicillin -pot clavulanate (AUGMENTIN ) 875-125 mg per tablet, Take 1 tablet by mouth 2 (two) times a day. Added per [facility] ordered Rx list, Disp: , Rfl:  .  aspirin  81 mg EC tablet, Take  by mouth Once Daily. (Patient not taking: Reported on 12/27/2023), Disp: , Rfl:  .  BisaCODYL  (DULCOLAX) 10 mg suppository, Insert 10 mg into the rectum daily as needed for constipation (moderate constipation). Added per [facility] ordered Rx list, Disp: , Rfl:  .  clopidogreL  (PLAVIX ) 75 mg tablet, Take 75 mg by mouth., Disp: , Rfl:  .  cyclobenzaprine (FLEXERIL) 10 mg tablet, Take 1 tablet (10 mg total) by mouth 3 (three) times a day as needed for muscle spasms., Disp: 30 tablet, Rfl: 0 .  docusate sodium  (COLACE) 100 mg capsule, Take 100 mg by mouth every 12 (twelve) hours., Disp: , Rfl:  .  ezetimibe  (ZETIA ) 10 mg tablet, Take 10 mg by mouth Once Daily., Disp: , Rfl:  .  glucose blood (Blood Glucose Test) test strip, check blood sugar 5 times per day due to fluctuating blood sugars Dx E11, Disp: 200 each, Rfl: 2 .  glucose blood (OneTouch Verio test strips) test strip, USE TO TEST BLOOD GLUCOSE UP TO FIVE TIMES  DAILY, Disp: 200 strip, Rfl: 0 .  glucose monitoring kit kit, check blood sugar 5 times per day due to fluctuating blood sugars Dx E11, Disp: 1 each, Rfl: 0 .  HYDROcodone -acetaminophen  (NORCO) 7.5-325 mg per tablet, Take 1 tablet by mouth every 6 (six) hours as needed for moderate pain (4-6)., Disp: 60 tablet, Rfl: 0 .  HYDROcodone -acetaminophen  (NORCO) 7.5-325 mg per tablet, Take 1 tablet by mouth every 6 (six) hours as needed for moderate pain (4-6)., Disp: 60 tablet, Rfl: 0 .  HYDROcodone -acetaminophen  (NORCO) 7.5-325 mg per tablet, Take 1 tablet by mouth every 6 (six) hours as needed for moderate pain (4-6) or severe pain (7-10)., Disp: 60 tablet, Rfl: 0 .  hydrOXYzine  (ATARAX ) 25 mg tablet, Take 25 mg by mouth 3 (three) times a day as needed for anxiety., Disp: , Rfl:  .  Lancets misc, check blood sugar 5 times per day due to fluctuating blood sugars Dx E11., Disp: 200 each, Rfl: 2 .  metFORMIN  (GLUCOPHAGE ) 1,000 mg tablet, TAKE 1 TABLET(1000 MG) BY MOUTH IN THE MORNING AND IN THE EVENING WITH MEALS, Disp: 180 tablet, Rfl: 0 .  miscellaneous medical supply misc, Adult diapers #90 with 3 refills.  To be wore daily for urinary incontinence Dx: Urinary incontinence R32, Disp: 90 each, Rfl: 3 .  oxyCODONE  (ROXICODONE ) 5 mg immediate release tablet, Take 5 mg by mouth  every 4 (four) hours as needed for moderate pain (4-6) or severe pain (7-10). Added per [facility] ordered Rx list, Disp: , Rfl:  .  pen needle, diabetic (BD Nano 2nd Gen Pen Needle) 32 gauge x 5/32 ndle, USE 1 PEN NEEDLE PER INJECTION DAILY, Disp: 100 each, Rfl: 0 .  rosuvastatin  (CRESTOR ) 40 mg tablet, Take 1 tablet (40 mg total) by mouth daily., Disp: 30 tablet, Rfl: 2 .  saccharomyces boulardii (FLORASTOR) 250 mg capsule, Take 250 mg by mouth 2 (two) times a day. Added per [facility] ordered Rx list, Disp: , Rfl:  .  sennosides-docusate sodium  (PERICOLACE) 8.6-50 mg per tablet, Take 1 tablet by mouth 2 (two) times a day. Added per [facility] ordered  Rx list, Disp: , Rfl:  .  sertraline  (ZOLOFT ) 100 mg tablet, Take 2 tablets (200 mg total) by mouth daily., Disp: , Rfl:  .  sildenafiL  (VIAGRA ) 100 mg tablet, Take 100 mg by mouth daily as needed for erectile dysfunction. (Patient not taking: Reported on 12/27/2023), Disp: 30 tablet, Rfl: 0 .  tirzepatide (Mounjaro) 15 mg/0.5 mL subcutaneous pen injector, Inject 0.5 mL (15 mg total) under the skin every 7 days., Disp: 4 mL, Rfl: 3 .  traZODone  (DESYREL ) 50 mg tablet, Take 2 tablets (100 mg total) by mouth nightly., Disp: , Rfl:      Medication Request/Refills: Pharmacy Information (if applicable)   [] Not Applicable       [x]  Pharmacy listed  Send Medication Request to: [facility] on [address]                                                 [] Pharmacy not listed (added to pharmacy list in Epic) Send Medication Request to:      Listed Pharmacies: [id number] [place], [place] - [address] AT [facility] OF [address] - PHONE: [phone] - FAX: [phone] [facility] [phone] - [place], [place] - [address] AT [address] - PHONE: [phone] - FAX: [phone]

## 2024-03-22 NOTE — Progress Notes (Signed)
 [facility] MD/PA/NP OP Progress Note  03/29/2024 10:49 AM Jerry [name]  MRN:  [id number]   Assessment and Plan: Patient presents in person for today's visit. Patient is doing fairly in the context of increased psychosocial stressors of recent surgery and a close friend passing away. Patient is motivated to stop tobacco and alcohol use, continue monitoring progress. Patient reports chronic poor sleep, I discussed sleep hygiene interventions such as decreasing soda intake and stopping electronic use prior to bed time. Reassuringly, patient's wife is a strong support and patient has good behavioral activation activities throughout his day. No medication changes today, f/u in about 1.5 months.  # MDD recurrent, mild w/ anxious distress # Insomnia # PTSD - Continue Zoloft  200 mg daily - Continue trazodone  100 mg nightly - Continue hydroxyzine  25mg  TID PRN, for anxiety  -taking about twice a day - Start seeing Adam Goldammer for therapy in 10/15  # Tobacco use d/o - encourage cessation, pt is motivated  # Hx of Complicated grief-resolved  Identifying information: Jerry [name] is a 68 year old patient with a past psychiatric history of reported PTSD, MDD, GAD who presents today for medication management.  Chief Complaint:  Chief Complaint  Patient presents with   Follow-up    Subjective   Patient seen with his wife.  Patient reports feeling weak today. Since the previous visit, he notes prior to the surgery, his mood was steady. He notes his best friend passed away 3 weeks ago. Regarding medications, patient notes fair benefit. Patient reports the following adverse effects: denies. Reports some irritability with the surgery. In his typical day, he reports doing bible study, going to church, spend time with his grandkids, and watching sports.   Patient reports poor sleep, reporting difficulty falling and staying asleep- reporting about 6 hours of sleep nightly. He reports soda past noon 1-2  sodas afternoon. Patient reports poor appetite attributing this to the recent surgery. He does have an upcoming appointment with his surgeon.  Patient denies current SI, HI, and AVH.   Substance use:  Reports cigarette use, most recently yesterday. Plans to stop due to the recent surgery. Preivously smoking 3-4 cigarettes daily.  Reports occasional alcohol use- previously drank alcohol weekly   Visit Diagnosis:    ICD-10-CM   1. MDD (major depressive disorder), recurrent episode, moderate (HCC)  F33.1 sertraline  (ZOLOFT ) 100 MG tablet    traZODone  (DESYREL ) 100 MG tablet    hydrOXYzine  (VISTARIL ) 25 MG capsule       Past Psychiatric History:  Previously seen at [facility] for therapy and medication management Diagnosed with PTSD, MDD and GAD Completed therapy with Dr. Juleen 01/24/2022 - 11/25/2022; reports benefit Previous medications: trazodone  and Lexapro  No history of inpatient psychiatric care No history of suicide attempts  Family Psychiatric History: Granddaughter: Deceased, substance use disorder, bipolar, PTSD - History of depression in multiple sisters - Oldest sister: EtOH use disorder Mother: Dementia   Social History:  Living: with wife Occupation: retired for 3 years, used to be a Paramedic Relationship: married for 44 years Children: 3 daughters and 1 son Support: Geologist, engineering History: denies   Past Medical History:  Past Medical History:  Diagnosis Date   Arthritis    Diabetes mellitus 2008   type II   Dyslipidemia    Erectile dysfunction    History of cardiac catheterization 06/2013   Dr. Peter Swaziland   HTN (hypertension)    Hypogonadism male     Past Surgical History:  Procedure Laterality Date  APPENDECTOMY     BUBBLE STUDY  11/02/2021   Procedure: BUBBLE STUDY;  Surgeon: Alvan Ronal BRAVO, MD;  Location[facility] ENDOSCOPY;  Service: Cardiovascular;;   CHOLECYSTECTOMY N/A 03/14/2024   Procedure: LAPAROSCOPIC CHOLECYSTECTOMY;  Surgeon: Dasie Leonor CROME, MD;  Location: [facility] OR;  Service: General;  Laterality: N/A;   COLONOSCOPY  08/2006   Dr. Aneita - repeat 2018   INCISION AND DRAINAGE PERIRECTAL ABSCESS     INDOCYANINE GREEN  FLUORESCENCE IMAGING (ICG) N/A 03/14/2024   Procedure: INDOCYANINE GREEN  FLUORESCENCE IMAGING (ICG);  Surgeon: Dasie Leonor CROME, MD;  Location: [facility] OR;  Service: General;  Laterality: N/A;   KNEE ARTHROSCOPY Left 12/28/2014   Procedure: ARTHROSCOPY LEFT KNEE WITH  MEDIAL MENSICUS DEBRIDEMENT;  Surgeon: Dempsey Moan, MD;  Location: [facility] ORS;  Service: Orthopedics;  Laterality: Left;   KNEE SURGERY     left knee - remote past   LEFT HEART CATHETERIZATION WITH CORONARY ANGIOGRAM N/A 06/11/2013   Procedure: LEFT HEART CATHETERIZATION WITH CORONARY ANGIOGRAM;  Surgeon: Peter M Swaziland, MD;  Location: [facility] CATH LAB;  Service: Cardiovascular;  Laterality: N/A;   LOOP RECORDER INSERTION N/A 10/22/2021   Procedure: LOOP RECORDER INSERTION;  Surgeon: Fernande Elspeth BROCKS, MD;  Location: [facility] INVASIVE CV LAB;  Service: Cardiovascular;  Laterality: N/A;   right arm and elbow surgery     TEE WITHOUT CARDIOVERSION N/A 11/02/2021   Procedure: TRANSESOPHAGEAL ECHOCARDIOGRAM (TEE);  Surgeon: Alvan Ronal BRAVO, MD;  Location: [facility] ENDOSCOPY;  Service: Cardiovascular;  Laterality: N/A;   TONSILLECTOMY     TOTAL KNEE ARTHROPLASTY Left 10/04/2015   Procedure: LEFT TOTAL KNEE ARTHROPLASTY right knee aspiration and injection;  Surgeon: Dempsey Moan, MD;  Location: [facility] ORS;  Service: Orthopedics;  Laterality: Left;   TOTAL KNEE ARTHROPLASTY Right 11/04/2016   Procedure: RIGHT TOTAL KNEE ARTHROPLASTY;  Surgeon: Dempsey Moan, MD;  Location: [facility] ORS;  Service: Orthopedics;  Laterality: Right;  with abductor block   wisdom teeth extractions      Family History:  Family History  Problem Relation Age of Onset   Diabetes Mother    Other Father        death by MVA   Other Brother        died of MVA   Stroke Neg Hx    Heart disease Neg Hx    Cancer Neg Hx     Hypertension Neg Hx     Social History:  Social History   Socioeconomic History   Marital status: Married    Spouse name: Not on file   Number of children: Not on file   Years of education: Not on file   Highest education level: Not on file  Occupational History   Occupation: retired    Comment: prior physical therapist  Tobacco Use   Smoking status: Former    Current packs/day: 0.25    Types: Cigarettes   Smokeless tobacco: Never   Tobacco comments:    Started smoking again, 1 pack every 4 days  Substance and Sexual Activity   Alcohol use: No   Drug use: No   Sexual activity: Never  Other Topics Concern   Not on file  Social History Narrative   Married 22 years, 2 daughter and 1 son. Retired, used to work as a PT.  Exercises treadmill daily, some weight bearing exercise. Diet    Social Drivers of Corporate investment banker Strain: Not on file  Food Insecurity: No Food Insecurity (03/10/2024)   Hunger Vital Sign  Worried About Programme researcher, broadcasting/film/video in the Last Year: Never true    Ran Out of Food in the Last Year: Never true  Transportation Needs: No Transportation Needs (03/10/2024)   PRAPARE - Administrator, Civil Service (Medical): No    Lack of Transportation (Non-Medical): No  Physical Activity: Not on file  Stress: Not on file  Social Connections: Socially Integrated (03/10/2024)   Social Connection and Isolation Panel    Frequency of Communication with Friends and Family: More than three times a week    Frequency of Social Gatherings with Friends and Family: More than three times a week    Attends Religious Services: More than 4 times per year    Active Member of [employer] or Organizations: Yes    Attends Engineer, structural: More than 4 times per year    Marital Status: Married    Allergies:  Allergies  Allergen Reactions   Diclofenac  Sodium Other (See Comments)    Gel caused a burning sensation    Lisinopril  Cough   Losartan  Cough     Unknown bad side effects     Metabolic Disorder Labs: Lab Results  Component Value Date   HGBA1C 3.6 (L) 03/11/2024   MPG 56.62 03/11/2024   MPG 131.24 10/21/2021   Lab Results  Component Value Date   PROLACTIN 3.7 11/15/2011   Lab Results  Component Value Date   CHOL 163 10/21/2021   TRIG 84 10/21/2021   HDL 58 10/21/2021   CHOLHDL 2.8 10/21/2021   VLDL 17 10/21/2021   LDLCALC 88 10/21/2021   LDLCALC 76 08/23/2014   Lab Results  Component Value Date   TSH 1.841 01/14/2014   TSH 1.304 11/15/2011    Therapeutic Level Labs: No results found for: LITHIUM No results found for: VALPROATE No results found for: CBMZ  Current Medications: Current Outpatient Medications  Medication Sig Dispense Refill   acetaminophen  (TYLENOL ) 500 MG tablet Take 1,000 mg by mouth every 6 (six) hours as needed for mild pain.     albuterol (VENTOLIN HFA) 108 (90 Base) MCG/ACT inhaler SMARTSIG:2 Puff(s) By Mouth Every 4 Hours PRN     amLODipine  (NORVASC ) 10 MG tablet Take 10 mg by mouth daily.     aspirin  EC 81 MG tablet Take 1 tablet (81 mg total) by mouth daily. Swallow whole. 30 tablet    bisacodyl  (DULCOLAX) 10 MG suppository Place 1 suppository (10 mg total) rectally daily as needed for moderate constipation. 12 suppository 0   Calcium  Carbonate Antacid (TUMS PO) Take 2 tablets by mouth daily as needed (reflux).     clopidogrel  (PLAVIX ) 75 MG tablet Take 1 tablet (75 mg total) by mouth daily. 90 tablet 3   cyclobenzaprine (FLEXERIL) 10 MG tablet Take 10 mg by mouth daily as needed for muscle spasms.     ezetimibe  (ZETIA ) 10 MG tablet Take 1 tablet (10 mg total) by mouth daily.     [Paused] HYDROcodone -acetaminophen  (NORCO) 7.5-325 MG tablet Take 1-2 tablets by mouth daily as needed for moderate pain (pain score 4-6) or severe pain (pain score 7-10).     hydrOXYzine  (VISTARIL ) 25 MG capsule Take 1 capsule (25 mg total) by mouth 3 (three) times daily as needed for anxiety. 90 capsule 1    ibuprofen (ADVIL) 200 MG tablet Take 200-400 mg by mouth daily as needed for headache, moderate pain (pain score 4-6) or mild pain (pain score 1-3).     [Paused] Insulin  Glargine (LANTUS  SOLOSTAR) 100  UNIT/ML Solostar Pen Inject 15 Units into the skin 2 (two) times daily. (Patient not taking: Reported on 03/11/2024) 15 mL 3   ketorolac  (ACULAR ) 0.5 % ophthalmic solution Place 1 drop into the right eye as needed (every 3 months after eye injection).     melatonin 5 MG TABS Take 5 mg by mouth at bedtime as needed (sleep).     metFORMIN  (GLUCOPHAGE ) 1000 MG tablet Take 1 tablet (1,000 mg total) by mouth daily. (Patient taking differently: Take 1,000 mg by mouth 2 (two) times daily with a meal.) 180 tablet 0   MOUNJARO 15 MG/0.5ML Pen Inject 15 mg into the skin once a week. Mondays     oxyCODONE  (OXY IR/ROXICODONE ) 5 MG immediate release tablet Take 1 tablet (5 mg total) by mouth every 4 (four) hours as needed for moderate pain (pain score 4-6) or severe pain (pain score 7-10). 15 tablet 0   rosuvastatin  (CRESTOR ) 40 MG tablet Take 1 tablet (40 mg total) by mouth daily.     sertraline  (ZOLOFT ) 100 MG tablet Take 1 tablet (100 mg total) by mouth in the morning and at bedtime. 60 tablet 1   Study - OCEANIC-STROKE - asundexian 50 mg or placebo tablet (PI-Sethi) Take 1 tablet (50 mg total) by mouth daily. For Investigational Use Only. Take at the same time each day (preferably in the morning). Tablet should be swallowed whole with water; it CANNOT be crushed or broken. Please contact [facility] if you have any questions regarding this medication or study. 196 tablet 0   traZODone  (DESYREL ) 100 MG tablet Take 1 tablet (100 mg total) by mouth at bedtime. 30 tablet 1   No current facility-administered medications for this visit.    Psychiatric Specialty Exam: General Appearance: appears at stated age, casually dressed and groomed   Behavior: pleasant and cooperative, tired  Psychomotor  Activity: no psychomotor agitation or retardation noted   Eye Contact: fair  Speech: normal amount, volume and fluency    Mood: weak Affect: congruent  Thought Process: linear, goal directed, no circumstantial or tangential thought process noted, no racing thoughts or flight of ideas  Descriptions of Associations: intact   Thought Content Hallucinations: denies AH, VH , does not appear responding to stimuli  Delusions: no paranoia, delusions of control, grandeur, ideas of reference, thought broadcasting, and magical thinking  Suicidal Thoughts: denies SI, intention, plan  Homicidal Thoughts: denies HI, intention, plan   Alertness/Orientation: alert and fully oriented   Insight: fair Judgment: fair  Memory: intact   Executive Functions  Concentration: intact  Attention Span: fair  Recall: intact  Fund of Knowledge: fair   Physical Exam  General: Pleasant, well-appearing. No acute distress. Pulmonary: Normal effort. No wheezing or rales. Skin: No obvious rash or lesions. Neuro: A&Ox3.No focal deficit.  Review of Systems  Positive nausea and weakness   Screenings: GAD-7    Flowsheet Row Counselor from 03/02/2024 in [facility]  Total GAD-7 Score 10   PHQ2-9    Flowsheet Row Counselor from 03/02/2024 in [facility] Counselor from 08/02/2022 in [facility] Counselor from 04/29/2022 in [facility] Counselor from 02/19/2022 in [facility] Counselor from 01/21/2022 in [facility]  PHQ-2 Total Score 3 3 3 6 5   PHQ-9 Total Score 10 12 11 15 25    Flowsheet Row ED to Hosp-Admission (Discharged) from 03/10/2024 in [facility] [place]  [facility] Counselor from 03/02/2024 in [facility] Counselor from 01/21/2022 in [facility]  C-SSRS RISK  CATEGORY No Risk No Risk Error: Q3, 4, or 5 should not be populated when Q2 is No     Ismael Franco, MD PGY-3 Psychiatry Resident

## 2024-03-25 NOTE — Progress Notes (Signed)
 Carelink Summary Report / Loop Recorder

## 2024-03-29 ENCOUNTER — Ambulatory Visit (HOSPITAL_BASED_OUTPATIENT_CLINIC_OR_DEPARTMENT_OTHER): Admitting: Psychiatry

## 2024-03-29 DIAGNOSIS — F331 Major depressive disorder, recurrent, moderate: Secondary | ICD-10-CM | POA: Diagnosis not present

## 2024-03-29 MED ORDER — SERTRALINE HCL 100 MG PO TABS: 100.0000 mg | ORAL_TABLET | Freq: Two times a day (BID) | ORAL | 1 refills | Status: DC

## 2024-03-29 MED ORDER — HYDROXYZINE PAMOATE 25 MG PO CAPS: 25.0000 mg | ORAL_CAPSULE | Freq: Three times a day (TID) | ORAL | 1 refills | Status: DC | PRN

## 2024-03-29 MED ORDER — TRAZODONE HCL 100 MG PO TABS: 100.0000 mg | ORAL_TABLET | Freq: Every day | ORAL | 1 refills | Status: DC

## 2024-03-29 NOTE — Addendum Note (Signed)
 Addended by: CARVIN CROCK on: 03/29/2024 11:04 AM   Modules accepted: Level of Service

## 2024-03-30 NOTE — Patient Instructions (Signed)
 Dr. Dasie is going to send Dr. Rosalie a message to see if we can arrange for ERCP We are going to make you an appointment with Dr. Dasie next week as well Continue to keep a log of the drain output and call if you have any questions or concerns before follow-up such as increased abdominal pain or fever

## 2024-03-30 NOTE — Progress Notes (Signed)
   PROVIDER:  PUJA GOSAI MACZIS, PA  MRN: [id number] DOB: August 02, 1955 DATE OF ENCOUNTER: 03/30/2024 Interval History:     Mr. Feenstra is a 68 yo male who presents for postop follow up and drain check after undergoing a lap subtotal fenestrating cholecystectomy on 9/7 for gangrenous cholecystitis by Dr. Dasie. He was discharged on POD2, at which time there was bile-tinged fluid in his drain, with relatively low volume output.   He saw Dr. Dasie on 03/23/2024 at which time the drain output was bilious about 250 mL/day.  The drain output for the first few days after discharge was <142mL per day.  He is presenting for another drain check today.  His wife provided me with the drain log that shows 160 to per day over the past week.  He denies any major abdominal pain or fever but states he does feel lightheaded and occasional shortness of breath with movement.  Denies nausea and vomiting.  Surgical Pathology: A. 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  Constitutional:      General: He is not in acute distress.    Appearance: Normal appearance.  Pulmonary:     Effort: Pulmonary effort is normal. No respiratory distress.  Abdominal:     General: There is no distension.     Palpations: Abdomen is soft.     Tenderness: There is no abdominal tenderness.     Comments: Incisions are healing well with no erythema, induration or drainage. RUQ JP is bilious.  Skin:    General: Skin is warm and dry.  Neurological:     General: No focal deficit present.     Mental Status: He is alert and oriented to person, place, and time.        Assessment and Plan:     SALBADOR FIVEASH is a 68 y.o. male who underwent laparoscopic subtotal fenestrating cholecystectomy on 03/14/24 for acute gangrenous cholecystitis. He has a bile leak, which is controlled with his surgical drain. He  clinically appears well without signs of infection.   His drain volume remains under per day but he does appear deconditioned/weak.  I spoke to Dr. Dasie on the phone who stated with Dr. Rosalie regarding ERCP and stent placement.  The patient was instructed to stop by [facility] and speak to Dr. Janett nurse regarding steps moving forward. He will call if he develops any fevers, chills, worsening abdominal pain, or general malaise. He expressed understanding.  Diagnoses and all orders for this visit:  S/P laparoscopic cholecystectomy  Bile leak, postoperative -     Complete Blood Count (CBC) with Differential; Future -     CMP14+eGFR - [facility]; Future      Return for appt with surgeon as planned.   The plan was discussed in detail with the patient today, who expressed understanding.  The patient has my contact information, and understands to call me with any additional questions or concerns in the interval.  I would be happy to see the patient back sooner if the need arises.   PUJA GOSAI MACZIS, PA

## 2024-03-31 ENCOUNTER — Other Ambulatory Visit: Payer: Self-pay | Admitting: Gastroenterology

## 2024-03-31 ENCOUNTER — Ambulatory Visit
Admission: RE | Admit: 2024-03-31 | Discharge: 2024-03-31 | Disposition: A | Discharge status: Home / Self Care | Source: Ambulatory Visit | Attending: Gastroenterology | Admitting: Gastroenterology

## 2024-03-31 DIAGNOSIS — R0602 Shortness of breath: Secondary | ICD-10-CM

## 2024-04-02 ENCOUNTER — Ambulatory Visit (HOSPITAL_COMMUNITY)

## 2024-04-02 ENCOUNTER — Other Ambulatory Visit: Payer: Self-pay

## 2024-04-02 ENCOUNTER — Encounter (HOSPITAL_COMMUNITY): Payer: Self-pay | Admitting: Gastroenterology

## 2024-04-02 ENCOUNTER — Ambulatory Visit (HOSPITAL_COMMUNITY): Admitting: Anesthesiology

## 2024-04-02 ENCOUNTER — Encounter (HOSPITAL_COMMUNITY)
Admission: RE | Disposition: A | Discharge status: Home / Self Care | Payer: Self-pay | Source: Home / Self Care | Attending: Gastroenterology

## 2024-04-02 ENCOUNTER — Ambulatory Visit (HOSPITAL_COMMUNITY)
Admission: RE | Admit: 2024-04-02 | Discharge: 2024-04-02 | Disposition: A | Discharge status: Home / Self Care | Source: Home / Self Care | Attending: Gastroenterology | Admitting: Gastroenterology

## 2024-04-02 DIAGNOSIS — Z87891 Personal history of nicotine dependence: Secondary | ICD-10-CM

## 2024-04-02 DIAGNOSIS — I10 Essential (primary) hypertension: Secondary | ICD-10-CM

## 2024-04-02 DIAGNOSIS — F418 Other specified anxiety disorders: Secondary | ICD-10-CM | POA: Diagnosis not present

## 2024-04-02 DIAGNOSIS — T8579XA Infection and inflammatory reaction due to other internal prosthetic devices, implants and grafts, initial encounter: Secondary | ICD-10-CM | POA: Diagnosis not present

## 2024-04-02 DIAGNOSIS — K9189 Other postprocedural complications and disorders of digestive system: Secondary | ICD-10-CM | POA: Diagnosis not present

## 2024-04-02 DIAGNOSIS — F32A Depression, unspecified: Secondary | ICD-10-CM | POA: Insufficient documentation

## 2024-04-02 DIAGNOSIS — R0602 Shortness of breath: Secondary | ICD-10-CM | POA: Insufficient documentation

## 2024-04-02 DIAGNOSIS — Z794 Long term (current) use of insulin: Secondary | ICD-10-CM | POA: Insufficient documentation

## 2024-04-02 DIAGNOSIS — K8050 Calculus of bile duct without cholangitis or cholecystitis without obstruction: Secondary | ICD-10-CM

## 2024-04-02 DIAGNOSIS — Z7984 Long term (current) use of oral hypoglycemic drugs: Secondary | ICD-10-CM | POA: Insufficient documentation

## 2024-04-02 DIAGNOSIS — F419 Anxiety disorder, unspecified: Secondary | ICD-10-CM | POA: Insufficient documentation

## 2024-04-02 DIAGNOSIS — Z7951 Long term (current) use of inhaled steroids: Secondary | ICD-10-CM | POA: Insufficient documentation

## 2024-04-02 DIAGNOSIS — E119 Type 2 diabetes mellitus without complications: Secondary | ICD-10-CM | POA: Insufficient documentation

## 2024-04-02 HISTORY — PX: ERCP: SHX5425

## 2024-04-02 HISTORY — PX: PANCREATIC STENT PLACEMENT: SHX5539

## 2024-04-02 HISTORY — PX: STENT REMOVAL: SHX6421

## 2024-04-02 HISTORY — PX: BILIARY STENT PLACEMENT: SHX5538

## 2024-04-02 LAB — GLUCOSE, CAPILLARY
Glucose-Capillary: 81 mg/dL (ref 70–99)
Glucose-Capillary: 113 mg/dL — ABNORMAL HIGH (ref 70–99)

## 2024-04-02 SURGERY — ERCP, WITH INTERVENTION IF INDICATED
Anesthesia: General

## 2024-04-02 MED ORDER — ROCURONIUM BROMIDE 10 MG/ML (PF) SYRINGE
PREFILLED_SYRINGE | INTRAVENOUS | Status: DC | PRN
  Administered 2024-04-02: 5 mg via INTRAVENOUS

## 2024-04-02 MED ORDER — LACTATED RINGERS IV SOLN: INTRAVENOUS | Status: DC | PRN

## 2024-04-02 MED ORDER — SUGAMMADEX SODIUM 200 MG/2ML IV SOLN
INTRAVENOUS | Status: DC | PRN
  Administered 2024-04-02: 200 mg via INTRAVENOUS

## 2024-04-02 MED ORDER — PROPOFOL 10 MG/ML IV BOLUS
INTRAVENOUS | Status: DC | PRN
  Administered 2024-04-02: 150 mg via INTRAVENOUS
  Administered 2024-04-02: 30 mg via INTRAVENOUS

## 2024-04-02 MED ORDER — ONDANSETRON HCL 4 MG/2ML IJ SOLN
INTRAMUSCULAR | Status: DC | PRN
  Administered 2024-04-02: 4 mg via INTRAVENOUS

## 2024-04-02 MED ORDER — FENTANYL CITRATE (PF) 100 MCG/2ML IJ SOLN
INTRAMUSCULAR | Status: AC
  Filled 2024-04-02: qty 2

## 2024-04-02 MED ORDER — DEXAMETHASONE SODIUM PHOSPHATE 10 MG/ML IJ SOLN
INTRAMUSCULAR | Status: DC | PRN
  Administered 2024-04-02: 5 mg via INTRAVENOUS

## 2024-04-02 MED ORDER — DICLOFENAC SUPPOSITORY 100 MG
RECTAL | Status: AC
  Filled 2024-04-02: qty 1

## 2024-04-02 MED ORDER — INDOMETHACIN 50 MG RE SUPP
RECTAL | Status: DC | PRN
Start: 2024-04-02 — End: 2024-04-02
  Administered 2024-04-02: 100 mg via RECTAL

## 2024-04-02 MED ORDER — INDOMETHACIN 50 MG RE SUPP
RECTAL | Status: AC
  Filled 2024-04-02: qty 2

## 2024-04-02 MED ORDER — GLUCAGON HCL RDNA (DIAGNOSTIC) 1 MG IJ SOLR
INTRAMUSCULAR | Status: AC
  Filled 2024-04-02: qty 1

## 2024-04-02 MED ORDER — LIDOCAINE 2% (20 MG/ML) 5 ML SYRINGE
INTRAMUSCULAR | Status: DC | PRN
  Administered 2024-04-02: 60 mg via INTRAVENOUS

## 2024-04-02 MED ORDER — PHENYLEPHRINE 80 MCG/ML (10ML) SYRINGE FOR IV PUSH (FOR BLOOD PRESSURE SUPPORT)
PREFILLED_SYRINGE | INTRAVENOUS | Status: DC | PRN
  Administered 2024-04-02 (×2): 160 ug via INTRAVENOUS

## 2024-04-02 MED ORDER — CIPROFLOXACIN IN D5W 400 MG/200ML IV SOLN
INTRAVENOUS | Status: AC
  Filled 2024-04-02: qty 200

## 2024-04-02 MED ORDER — SODIUM CHLORIDE 0.9 % IV SOLN
INTRAVENOUS | Status: DC | PRN
  Administered 2024-04-02: 10 mL

## 2024-04-02 MED ORDER — FENTANYL CITRATE (PF) 250 MCG/5ML IJ SOLN
INTRAMUSCULAR | Status: DC | PRN
  Administered 2024-04-02 (×2): 50 ug via INTRAVENOUS

## 2024-04-02 MED ORDER — CIPROFLOXACIN IN D5W 400 MG/200ML IV SOLN
INTRAVENOUS | Status: DC | PRN
Start: 2024-04-02 — End: 2024-04-02
  Administered 2024-04-02: 400 mg via INTRAVENOUS

## 2024-04-02 MED ORDER — PHENYLEPHRINE HCL-NACL 20-0.9 MG/250ML-% IV SOLN
INTRAVENOUS | Status: DC | PRN
  Administered 2024-04-02: 30 ug/min via INTRAVENOUS

## 2024-04-02 NOTE — Discharge Instructions (Addendum)
 Call if GI question or problem otherwise clear liquids until 3 PM and if doing well may have soft solids and if doing well tomorrow may resume your Plavix  and then on Sunday your other blood thinner and if you are external drain size continues to bother you please call interventional radiology or the surgical team Dr. Dasie to assist in follow-up with me 2 weeks in the office after your drain is removed please call me when that happens to set up the appointmentYOU HAD AN ENDOSCOPIC PROCEDURE TODAY: Refer to the procedure report and other information in the discharge instructions given to you for any specific questions about what was found during the examination. If this information does not answer your questions, please call [facility] office at [phone] to clarify.   YOU SHOULD EXPECT: Some feelings of bloating in the abdomen. Passage of more gas than usual. Walking can help get rid of the air that was put into your GI tract during the procedure and reduce the bloating. If you had a lower endoscopy (such as a colonoscopy or flexible sigmoidoscopy) you may notice spotting of blood in your stool or on the toilet paper. Some abdominal soreness may be present for a day or two, also.  DIET: Your first meal following the procedure should be a light meal and then it is ok to progress to your normal diet. A half-sandwich or bowl of soup is an example of a good first meal. Heavy or fried foods are harder to digest and may make you feel nauseous or bloated. Drink plenty of fluids but you should avoid alcoholic beverages for 24 hours. If you had a esophageal dilation, please see attached instructions for diet.    ACTIVITY: Your care partner should take you home directly after the procedure. You should plan to take it easy, moving slowly for the rest of the day. You can resume normal activity the day after the procedure however YOU SHOULD NOT DRIVE, use power tools, machinery or perform tasks that involve climbing or  major physical exertion for 24 hours (because of the sedation medicines used during the test).   SYMPTOMS TO REPORT IMMEDIATELY: A gastroenterologist can be reached at any hour. Please call [phone]  for any of the following symptoms:  Following lower endoscopy (colonoscopy, flexible sigmoidoscopy) Excessive amounts of blood in the stool  Significant tenderness, worsening of abdominal pains  Swelling of the abdomen that is new, acute  Fever of 100 or higher  Following upper endoscopy (EGD, EUS, ERCP, esophageal dilation) Vomiting of blood or coffee ground material  New, significant abdominal pain  New, significant chest pain or pain under the shoulder blades  Painful or persistently difficult swallowing  New shortness of breath  Black, tarry-looking or red, bloody stools  FOLLOW UP:  If any biopsies were taken you will be contacted by phone or by letter within the next 1-3 weeks. Call [phone]  if you have not heard about the biopsies in 3 weeks.  Please also call with any specific questions about appointments or follow up tests.

## 2024-04-02 NOTE — Op Note (Signed)
 [facility] Patient Name: Jerry [name] Procedure Date : 04/02/2024 MRN: [id number] Attending MD: Oliva Boots , MD, [id number] Date of Birth: [birth year] CSN: [id number] Age: 68 Admit Type: Outpatient Procedure:                ERCP Indications:              Bile duct stone(s), Bile leak Providers:                Oliva Boots, MD, Ozell Pouch, Coye Bade,                            Technician Referring MD:              Medicines:                General Anesthesia Complications:            No immediate complications. Estimated Blood Loss:     Estimated blood loss: none. Procedure:                Pre-Anesthesia Assessment:                           - Prior to the procedure, a History and Physical                            was performed, and patient medications and                            allergies were reviewed. The patient's tolerance of                            previous anesthesia was also reviewed. The risks                            and benefits of the procedure and the sedation                            options and risks were discussed with the patient.                            All questions were answered, and informed consent                            was obtained. Prior Anticoagulants: The patient has                            taken Plavix  (clopidogrel ), last dose was 4 days                            prior to procedure. ASA Grade Assessment: III - A                            patient with severe systemic disease. After  reviewing the risks and benefits, the patient was                            deemed in satisfactory condition to undergo the                            procedure.                           After obtaining informed consent, the scope was                            passed under direct vision. Throughout the                            procedure, the patient's blood pressure, pulse, and                             oxygen saturations were monitored continuously. The                            TJF-Q190L ([id number] Olympus duodenoscope was                            introduced through the mouth, and used to inject                            contrast into and used to cannulate the bile duct.                            The ERCP was technically difficult and complex due                            to challenging cannulation because of abnormal                            anatomy. The patient tolerated the procedure well. Scope In: Scope Out: Findings:      There was a moderate amount of old food in the stomach and unfortunately       the wire went towards the pancreas a few times so we elected to place       one 4 Fr by 3 cm temporary plastic pancreatic stent with a 3/4 external       pigtail and no internal flaps was placed 3 cm into the ventral       pancreatic duct. The stent was in good position. We had some difficulty       cannulating next to the stent and we switched to the smaller       sphincterotome and then we were able to cannulate with the smaller wire       the CBD and a small bile leak was confirmed on injection and we       proceeded with a biliary sphincterotomy was made with a Hydratome       sphincterotome using ERBE electrocautery. There was no       post-sphincterotomy bleeding. The  biliary tree was swept with an       adjustable 9 to 12 mm balloon using the 9 mm balloon starting at the       bifurcation. Sludge was swept from the duct. The balloon passed readily       through the patent sphincterotomy site and then one 10 Fr by 7 cm       temporary plastic biliary stent with a single external flap and a single       internal flap was placed 6.5 cm into the common bile duct. Bile flowed       through the stent. The stent was in good position. Unfortunately the       introducer stayed in the stent and using the Raptor grasper we were able       to grab the introducer and remove it  through the scope and it was deemed       to be intact and in doing the sphincterotomy not mentioned above the       pancreatic stent fell out and it was removed from Duodenum using a       Raptor grasping device. We then briefly tried using the sphincterotome       to recannulate the pancreatic duct and replaced of the stent in order to       decrease the risk of pancreatitis but on a few efforts could not       cannulate the pancreatic duct and elected to stop the procedure at this       point and the scope was removed and the patient tolerated the procedure       well Impression:               - One temporary plastic pancreatic stent was placed                            into the ventral pancreatic duct. There was no dye                            injected into the pancreas and there was old food                            in the stomach                           - A biliary sphincterotomy was performed.                           - The biliary tree was swept and sludge was found.                           - One temporary plastic biliary stent was placed                            into the common bile duct.                           - One pancreatic duct stent was removed from  Duodenum after it had fallen out as above. Recommendation:           - Clear liquid diet for 6 hours. If doing well at 3                            PM may have soft solid                           - Continue present medications.                           - Resume Plavix  (clopidogrel ) at prior dose                            tomorrow. And if doing well in 2 days may restart                            your other blood thinner                           - Return to GI clinic in 4 weeks. And call when you                            are drain is removed to schedule an appointment in                            2 weeks after that                           - Telephone GI clinic if symptomatic  PRN. And call                            interventional radiology or general surgeon Dr.                            Dasie if the external drain continues to bother you Procedure Code(s):        --- Professional ---                           [phone], Endoscopic retrograde                            cholangiopancreatography (ERCP); with placement of                            endoscopic stent into biliary or pancreatic duct,                            including pre- and post-dilation and guide wire                            passage, when performed, including sphincterotomy,  when performed, each stent                           43274, 59, Endoscopic retrograde                            cholangiopancreatography (ERCP); with placement of                            endoscopic stent into biliary or pancreatic duct,                            including pre- and post-dilation and guide wire                            passage, when performed, including sphincterotomy,                            when performed, each stent                           43264, Endoscopic retrograde                            cholangiopancreatography (ERCP); with removal of                            calculi/debris from biliary/pancreatic duct(s) Diagnosis Code(s):        --- Professional ---                           S53.40, Encounter for fitting and adjustment of                            other gastrointestinal appliance and device                           K80.50, Calculus of bile duct without cholangitis                            or cholecystitis without obstruction                           K83.8, Other specified diseases of biliary tract CPT copyright 2022 [employer]. All rights reserved. The codes documented in this report are preliminary and upon coder review may  be revised to meet current compliance requirements. Oliva Boots, MD 04/02/2024 9:28:21 AM This report has  been signed electronically. Number of Addenda: 0

## 2024-04-02 NOTE — Anesthesia Preprocedure Evaluation (Signed)
 Anesthesia Evaluation  Patient identified by MRN, date of birth, ID band Patient awake    Reviewed: Allergy & Precautions, NPO status , Patient's Chart, lab work & pertinent test results  History of Anesthesia Complications Negative for: history of anesthetic complications  Airway Mallampati: II  TM Distance: >3 FB Neck ROM: Full    Dental  (+) Upper Dentures, Dental Advisory Given, Partial Lower,    Pulmonary shortness of breath, neg sleep apnea, neg COPD, neg recent URI, former smoker   breath sounds clear to auscultation       Cardiovascular hypertension, (-) angina (-) Past MI and (-) CHF (-) dysrhythmias  Rhythm:Regular  2023:   1. Left ventricular ejection fraction, by estimation, is 70 to 75%. The  left ventricle has hyperdynamic function.   2. Right ventricular systolic function is normal. The right ventricular  size is normal.   3. No left atrial/left atrial appendage thrombus was detected.   4. The mitral valve is normal in structure. No evidence of mitral valve  regurgitation.   5. The aortic valve is normal in structure. Aortic valve regurgitation is  not visualized.   6. There is Severe (Grade IV) plaque.   7. 1-2 bubbles at ~ 3-4 cardiac cycles. possible small PFO.     Neuro/Psych  PSYCHIATRIC DISORDERS Anxiety Depression     Neuromuscular disease CVA, No Residual Symptoms    GI/Hepatic negative GI ROS,,,  Endo/Other  diabetes, Insulin  Dependent  Lab Results      Component                Value               Date                      HGBA1C                   3.6 (L)             03/11/2024             Renal/GU      Musculoskeletal  (+) Arthritis ,    Abdominal   Peds  Hematology  (+) Blood dyscrasia   Anesthesia Other Findings   Reproductive/Obstetrics                              Anesthesia Physical Anesthesia Plan  ASA: 3  Anesthesia Plan: General   Post-op Pain  Management: Minimal or no pain anticipated   Induction: Intravenous  PONV Risk Score and Plan: 3 and Ondansetron  and Dexamethasone   Airway Management Planned: Oral ETT  Additional Equipment: None  Intra-op Plan:   Post-operative Plan: Extubation in OR  Informed Consent: I have reviewed the patients History and Physical, chart, labs and discussed the procedure including the risks, benefits and alternatives for the proposed anesthesia with the patient or authorized representative who has indicated his/her understanding and acceptance.     Dental advisory given  Plan Discussed with: CRNA  Anesthesia Plan Comments:          Anesthesia Quick Evaluation

## 2024-04-02 NOTE — Progress Notes (Signed)
 Jerry [name] 7:33 AM  Subjective: Patient seen and examined and his main complaint is his drainage tube is too short and we rediscussed the procedure and answered all of his questions  Objective: Vital signs stable afebrile no acute distress exam please see preassessment evaluation  Assessment: Bile leak  Plan: Okay to proceed with ERCP and hopefully CBD stenting with anesthesia assistance  [facility]  office [phone] After 5PM or if no answer call [phone]

## 2024-04-02 NOTE — Transfer of Care (Signed)
 Immediate Anesthesia Transfer of Care Note  Patient: Jerry [name]  Procedure(s) Performed: ERCP, WITH INTERVENTION IF INDICATED INSERTION, STENT, PANCREATIC DUCT STENT REMOVAL INSERTION, STENT, BILE DUCT  Patient Location: Endoscopy Unit  Anesthesia Type:General  Level of Consciousness: awake, alert , and oriented  Airway & Oxygen Therapy: Patient Spontanous Breathing and Patient connected to face mask oxygen  Post-op Assessment: Report given to RN and Post -op Vital signs reviewed and stable  Post vital signs: Reviewed and stable  Last Vitals:  Vitals Value Taken Time  BP    Temp    Pulse    Resp    SpO2      Last Pain:  Vitals:   04/02/24 0650  TempSrc: Temporal  PainSc: 0-No pain         Complications: No notable events documented.

## 2024-04-02 NOTE — Anesthesia Postprocedure Evaluation (Signed)
 Anesthesia Post Note  Patient: JAELAN RASHEED  Procedure(s) Performed: ERCP, WITH INTERVENTION IF INDICATED INSERTION, STENT, PANCREATIC DUCT STENT REMOVAL INSERTION, STENT, BILE DUCT     Patient location during evaluation: PACU Anesthesia Type: General Level of consciousness: awake and alert Pain management: pain level controlled Vital Signs Assessment: post-procedure vital signs reviewed and stable Respiratory status: spontaneous breathing, nonlabored ventilation, respiratory function stable and patient connected to nasal cannula oxygen Cardiovascular status: blood pressure returned to baseline and stable Postop Assessment: no apparent nausea or vomiting Anesthetic complications: no   No notable events documented.  Last Vitals:  Vitals:   04/02/24 0940 04/02/24 0945  BP: (!) 144/89   Pulse: 84 84  Resp: 17 (!) 21  Temp:    SpO2: 98% 97%    Last Pain:  Vitals:   04/02/24 0945  TempSrc:   PainSc: 0-No pain                 Epifanio Lamar BRAVO

## 2024-04-02 NOTE — Anesthesia Procedure Notes (Signed)
 Procedure Name: Intubation Date/Time: 04/02/2024 7:42 AM  Performed by: Virgil Ee, CRNAPre-anesthesia Checklist: Patient identified, Patient being monitored, Timeout performed, Emergency Drugs available and Suction available Patient Re-evaluated:Patient Re-evaluated prior to induction Oxygen Delivery Method: Circle system utilized Preoxygenation: Pre-oxygenation with 100% oxygen Induction Type: IV induction Ventilation: Mask ventilation without difficulty Laryngoscope Size: Mac and 4 Grade View: Grade I Tube type: Oral Tube size: 7.0 mm Number of attempts: 1 Airway Equipment and Method: Stylet Placement Confirmation: ETT inserted through vocal cords under direct vision, positive ETCO2 and breath sounds checked- equal and bilateral Secured at: 23 cm Tube secured with: Tape Dental Injury: Teeth and Oropharynx as per pre-operative assessment

## 2024-04-03 ENCOUNTER — Inpatient Hospital Stay (HOSPITAL_COMMUNITY)
Admission: EM | Admit: 2024-04-03 | Discharge: 2024-04-05 | DRG: 919 | Disposition: A | Discharge status: Home / Self Care | Attending: Internal Medicine | Admitting: Internal Medicine

## 2024-04-03 ENCOUNTER — Emergency Department (HOSPITAL_COMMUNITY)

## 2024-04-03 ENCOUNTER — Encounter (HOSPITAL_COMMUNITY): Payer: Self-pay | Admitting: Internal Medicine

## 2024-04-03 DIAGNOSIS — T8579XA Infection and inflammatory reaction due to other internal prosthetic devices, implants and grafts, initial encounter: Secondary | ICD-10-CM | POA: Diagnosis present

## 2024-04-03 DIAGNOSIS — K8581 Other acute pancreatitis with uninfected necrosis: Secondary | ICD-10-CM | POA: Diagnosis present

## 2024-04-03 DIAGNOSIS — K82A1 Gangrene of gallbladder in cholecystitis: Secondary | ICD-10-CM | POA: Diagnosis present

## 2024-04-03 DIAGNOSIS — K9189 Other postprocedural complications and disorders of digestive system: Secondary | ICD-10-CM | POA: Diagnosis present

## 2024-04-03 DIAGNOSIS — K859 Acute pancreatitis without necrosis or infection, unspecified: Secondary | ICD-10-CM | POA: Diagnosis not present

## 2024-04-03 DIAGNOSIS — Z79899 Other long term (current) drug therapy: Secondary | ICD-10-CM

## 2024-04-03 DIAGNOSIS — D649 Anemia, unspecified: Secondary | ICD-10-CM

## 2024-04-03 DIAGNOSIS — Z23 Encounter for immunization: Secondary | ICD-10-CM | POA: Diagnosis present

## 2024-04-03 DIAGNOSIS — F32A Depression, unspecified: Secondary | ICD-10-CM | POA: Diagnosis present

## 2024-04-03 DIAGNOSIS — F411 Generalized anxiety disorder: Secondary | ICD-10-CM | POA: Diagnosis present

## 2024-04-03 DIAGNOSIS — Z9049 Acquired absence of other specified parts of digestive tract: Secondary | ICD-10-CM

## 2024-04-03 DIAGNOSIS — K91872 Postprocedural seroma of a digestive system organ or structure following a digestive system procedure: Secondary | ICD-10-CM | POA: Diagnosis present

## 2024-04-03 DIAGNOSIS — Y848 Other medical procedures as the cause of abnormal reaction of the patient, or of later complication, without mention of misadventure at the time of the procedure: Secondary | ICD-10-CM | POA: Diagnosis present

## 2024-04-03 DIAGNOSIS — E119 Type 2 diabetes mellitus without complications: Secondary | ICD-10-CM | POA: Diagnosis present

## 2024-04-03 DIAGNOSIS — Z96653 Presence of artificial knee joint, bilateral: Secondary | ICD-10-CM | POA: Diagnosis present

## 2024-04-03 DIAGNOSIS — N400 Enlarged prostate without lower urinary tract symptoms: Secondary | ICD-10-CM | POA: Diagnosis present

## 2024-04-03 DIAGNOSIS — Z888 Allergy status to other drugs, medicaments and biological substances status: Secondary | ICD-10-CM

## 2024-04-03 DIAGNOSIS — Z7951 Long term (current) use of inhaled steroids: Secondary | ICD-10-CM

## 2024-04-03 DIAGNOSIS — Z7984 Long term (current) use of oral hypoglycemic drugs: Secondary | ICD-10-CM

## 2024-04-03 DIAGNOSIS — Z9689 Presence of other specified functional implants: Secondary | ICD-10-CM | POA: Diagnosis present

## 2024-04-03 DIAGNOSIS — Z8673 Personal history of transient ischemic attack (TIA), and cerebral infarction without residual deficits: Secondary | ICD-10-CM

## 2024-04-03 DIAGNOSIS — E871 Hypo-osmolality and hyponatremia: Secondary | ICD-10-CM | POA: Diagnosis not present

## 2024-04-03 DIAGNOSIS — D509 Iron deficiency anemia, unspecified: Secondary | ICD-10-CM | POA: Diagnosis present

## 2024-04-03 DIAGNOSIS — E7849 Other hyperlipidemia: Secondary | ICD-10-CM | POA: Diagnosis not present

## 2024-04-03 DIAGNOSIS — Z1152 Encounter for screening for COVID-19: Secondary | ICD-10-CM | POA: Diagnosis not present

## 2024-04-03 DIAGNOSIS — E785 Hyperlipidemia, unspecified: Secondary | ICD-10-CM | POA: Diagnosis present

## 2024-04-03 DIAGNOSIS — G4700 Insomnia, unspecified: Secondary | ICD-10-CM | POA: Diagnosis present

## 2024-04-03 DIAGNOSIS — Z8639 Personal history of other endocrine, nutritional and metabolic disease: Secondary | ICD-10-CM | POA: Insufficient documentation

## 2024-04-03 DIAGNOSIS — R0602 Shortness of breath: Secondary | ICD-10-CM | POA: Diagnosis present

## 2024-04-03 DIAGNOSIS — I700 Atherosclerosis of aorta: Secondary | ICD-10-CM | POA: Diagnosis present

## 2024-04-03 DIAGNOSIS — B999 Unspecified infectious disease: Secondary | ICD-10-CM

## 2024-04-03 DIAGNOSIS — K8050 Calculus of bile duct without cholangitis or cholecystitis without obstruction: Secondary | ICD-10-CM | POA: Diagnosis present

## 2024-04-03 DIAGNOSIS — R1011 Right upper quadrant pain: Principal | ICD-10-CM

## 2024-04-03 DIAGNOSIS — Z7902 Long term (current) use of antithrombotics/antiplatelets: Secondary | ICD-10-CM

## 2024-04-03 DIAGNOSIS — Y828 Other medical devices associated with adverse incidents: Secondary | ICD-10-CM | POA: Diagnosis present

## 2024-04-03 DIAGNOSIS — Z833 Family history of diabetes mellitus: Secondary | ICD-10-CM

## 2024-04-03 DIAGNOSIS — F17210 Nicotine dependence, cigarettes, uncomplicated: Secondary | ICD-10-CM | POA: Diagnosis present

## 2024-04-03 DIAGNOSIS — K651 Peritoneal abscess: Secondary | ICD-10-CM | POA: Diagnosis present

## 2024-04-03 DIAGNOSIS — Y838 Other surgical procedures as the cause of abnormal reaction of the patient, or of later complication, without mention of misadventure at the time of the procedure: Secondary | ICD-10-CM | POA: Diagnosis present

## 2024-04-03 DIAGNOSIS — I2510 Atherosclerotic heart disease of native coronary artery without angina pectoris: Secondary | ICD-10-CM | POA: Diagnosis present

## 2024-04-03 DIAGNOSIS — Z794 Long term (current) use of insulin: Secondary | ICD-10-CM

## 2024-04-03 DIAGNOSIS — Z7985 Long-term (current) use of injectable non-insulin antidiabetic drugs: Secondary | ICD-10-CM

## 2024-04-03 DIAGNOSIS — I639 Cerebral infarction, unspecified: Secondary | ICD-10-CM | POA: Diagnosis not present

## 2024-04-03 DIAGNOSIS — Z7982 Long term (current) use of aspirin: Secondary | ICD-10-CM

## 2024-04-03 DIAGNOSIS — I10 Essential (primary) hypertension: Secondary | ICD-10-CM | POA: Diagnosis present

## 2024-04-03 DIAGNOSIS — Z87898 Personal history of other specified conditions: Secondary | ICD-10-CM | POA: Diagnosis not present

## 2024-04-03 LAB — URINALYSIS, ROUTINE W REFLEX MICROSCOPIC
Bilirubin Urine: NEGATIVE
Glucose, UA: NEGATIVE mg/dL
Hgb urine dipstick: NEGATIVE
Ketones, ur: NEGATIVE mg/dL
Leukocytes,Ua: NEGATIVE
Nitrite: NEGATIVE
Protein, ur: NEGATIVE mg/dL
Specific Gravity, Urine: 1.008 (ref 1.005–1.030)
pH: 8 (ref 5.0–8.0)

## 2024-04-03 LAB — CBC WITH DIFFERENTIAL/PLATELET
Abs Immature Granulocytes: 0.07 K/uL (ref 0.00–0.07)
Basophils Absolute: 0 K/uL (ref 0.0–0.1)
Basophils Relative: 0 %
Eosinophils Absolute: 0 K/uL (ref 0.0–0.5)
Eosinophils Relative: 0 %
HCT: 27.7 % — ABNORMAL LOW (ref 39.0–52.0)
Hemoglobin: 9.9 g/dL — ABNORMAL LOW (ref 13.0–17.0)
Immature Granulocytes: 1 %
Lymphocytes Relative: 10 %
Lymphs Abs: 1.6 K/uL (ref 0.7–4.0)
MCH: 35.1 pg — ABNORMAL HIGH (ref 26.0–34.0)
MCHC: 35.7 g/dL (ref 30.0–36.0)
MCV: 98.2 fL (ref 80.0–100.0)
Monocytes Absolute: 1.4 K/uL — ABNORMAL HIGH (ref 0.1–1.0)
Monocytes Relative: 9 %
Neutro Abs: 12.4 K/uL — ABNORMAL HIGH (ref 1.7–7.7)
Neutrophils Relative %: 80 %
Platelets: 325 K/uL (ref 150–400)
RBC: 2.82 MIL/uL — ABNORMAL LOW (ref 4.22–5.81)
RDW: 11.8 % (ref 11.5–15.5)
WBC: 15.5 K/uL — ABNORMAL HIGH (ref 4.0–10.5)
nRBC: 0 % (ref 0.0–0.2)

## 2024-04-03 LAB — MAGNESIUM: Magnesium: 1.5 mg/dL — ABNORMAL LOW (ref 1.7–2.4)

## 2024-04-03 LAB — TYPE AND SCREEN
ABO/RH(D): A POS
Antibody Screen: NEGATIVE

## 2024-04-03 LAB — GLUCOSE, CAPILLARY: Glucose-Capillary: 86 mg/dL (ref 70–99)

## 2024-04-03 LAB — COMPREHENSIVE METABOLIC PANEL WITH GFR
ALT: 11 U/L (ref 0–44)
AST: 20 U/L (ref 15–41)
Albumin: 2.3 g/dL — ABNORMAL LOW (ref 3.5–5.0)
Alkaline Phosphatase: 44 U/L (ref 38–126)
Anion gap: 7 (ref 5–15)
BUN: 6 mg/dL — ABNORMAL LOW (ref 8–23)
CO2: 19 mmol/L — ABNORMAL LOW (ref 22–32)
Calcium: 7.3 mg/dL — ABNORMAL LOW (ref 8.9–10.3)
Chloride: 109 mmol/L (ref 98–111)
Creatinine, Ser: 0.49 mg/dL — ABNORMAL LOW (ref 0.61–1.24)
GFR, Estimated: >60 mL/min (ref >60)
Glucose, Bld: 76 mg/dL (ref 70–99)
Potassium: 3.6 mmol/L (ref 3.5–5.1)
Sodium: 135 mmol/L (ref 135–145)
Total Bilirubin: 1.4 mg/dL — ABNORMAL HIGH (ref 0.0–1.2)
Total Protein: 5.2 g/dL — ABNORMAL LOW (ref 6.5–8.1)

## 2024-04-03 LAB — I-STAT CHEM 8, ED
BUN: 8 mg/dL (ref 8–23)
Calcium, Ion: 1.07 mmol/L — ABNORMAL LOW (ref 1.15–1.40)
Chloride: 98 mmol/L (ref 98–111)
Creatinine, Ser: 0.6 mg/dL — ABNORMAL LOW (ref 0.61–1.24)
Glucose, Bld: 132 mg/dL — ABNORMAL HIGH (ref 70–99)
HCT: 40 % (ref 39.0–52.0)
Hemoglobin: 13.6 g/dL (ref 13.0–17.0)
Potassium: 6.3 mmol/L (ref 3.5–5.1)
Sodium: 131 mmol/L — ABNORMAL LOW (ref 135–145)
TCO2: 28 mmol/L (ref 22–32)

## 2024-04-03 LAB — I-STAT CG4 LACTIC ACID, ED: Lactic Acid, Venous: 1.7 mmol/L (ref 0.5–1.9)

## 2024-04-03 LAB — LIPASE, BLOOD: Lipase: 1008 U/L — ABNORMAL HIGH (ref 11–51)

## 2024-04-03 MED ORDER — ACETAMINOPHEN 650 MG RE SUPP: 650.0000 mg | Freq: Four times a day (QID) | RECTAL | Status: DC | PRN

## 2024-04-03 MED ORDER — ASPIRIN 81 MG PO TBEC
81.0000 mg | DELAYED_RELEASE_TABLET | Freq: Every day | ORAL | Status: DC
  Administered 2024-04-04 – 2024-04-05 (×2): 81 mg via ORAL
  Filled 2024-04-03 (×2): qty 1

## 2024-04-03 MED ORDER — SENNOSIDES-DOCUSATE SODIUM 8.6-50 MG PO TABS
1.0000 | ORAL_TABLET | Freq: Two times a day (BID) | ORAL | Status: DC
  Administered 2024-04-03 – 2024-04-05 (×4): 1 via ORAL
  Filled 2024-04-03 (×4): qty 1

## 2024-04-03 MED ORDER — SODIUM CHLORIDE 0.9% FLUSH: 3.0000 mL | Freq: Two times a day (BID) | INTRAVENOUS | Status: DC

## 2024-04-03 MED ORDER — CLOPIDOGREL BISULFATE 75 MG PO TABS: 75.0000 mg | ORAL_TABLET | Freq: Every day | ORAL | Status: DC

## 2024-04-03 MED ORDER — SODIUM CHLORIDE 0.9% FLUSH
3.0000 mL | Freq: Two times a day (BID) | INTRAVENOUS | Status: DC
  Administered 2024-04-03 – 2024-04-05 (×3): 3 mL via INTRAVENOUS

## 2024-04-03 MED ORDER — ONDANSETRON HCL 4 MG/2ML IJ SOLN
4.0000 mg | Freq: Once | INTRAMUSCULAR | Status: AC
  Administered 2024-04-03: 4 mg via INTRAVENOUS
  Filled 2024-04-03: qty 2

## 2024-04-03 MED ORDER — ENOXAPARIN SODIUM 40 MG/0.4ML IJ SOSY
40.0000 mg | PREFILLED_SYRINGE | INTRAMUSCULAR | Status: DC
  Administered 2024-04-04 – 2024-04-05 (×2): 40 mg via SUBCUTANEOUS
  Filled 2024-04-03 (×2): qty 0.4

## 2024-04-03 MED ORDER — SODIUM CHLORIDE 0.9% FLUSH: 3.0000 mL | INTRAVENOUS | Status: DC | PRN

## 2024-04-03 MED ORDER — SODIUM CHLORIDE 0.9 % IV SOLN: 250.0000 mL | INTRAVENOUS | Status: AC | PRN

## 2024-04-03 MED ORDER — AMLODIPINE BESYLATE 10 MG PO TABS
10.0000 mg | ORAL_TABLET | Freq: Every day | ORAL | Status: DC
Start: 2024-04-03 — End: 2024-04-05
  Administered 2024-04-03 – 2024-04-05 (×3): 10 mg via ORAL
  Filled 2024-04-03: qty 1
  Filled 2024-04-03: qty 2
  Filled 2024-04-03: qty 1

## 2024-04-03 MED ORDER — ACETAMINOPHEN 325 MG PO TABS
650.0000 mg | ORAL_TABLET | Freq: Four times a day (QID) | ORAL | Status: DC | PRN
  Administered 2024-04-04: 650 mg via ORAL
  Filled 2024-04-03: qty 2

## 2024-04-03 MED ORDER — HYDROMORPHONE HCL 1 MG/ML IJ SOLN
0.5000 mg | INTRAMUSCULAR | Status: DC | PRN
  Administered 2024-04-03: 0.5 mg via INTRAVENOUS
  Administered 2024-04-04: 1 mg via INTRAVENOUS
  Administered 2024-04-04: 0.5 mg via INTRAVENOUS
  Administered 2024-04-04 – 2024-04-05 (×3): 1 mg via INTRAVENOUS
  Filled 2024-04-03 (×6): qty 1

## 2024-04-03 MED ORDER — EZETIMIBE 10 MG PO TABS
10.0000 mg | ORAL_TABLET | Freq: Every day | ORAL | Status: DC
  Administered 2024-04-03 – 2024-04-05 (×3): 10 mg via ORAL
  Filled 2024-04-03 (×3): qty 1

## 2024-04-03 MED ORDER — TRAZODONE HCL 50 MG PO TABS
100.0000 mg | ORAL_TABLET | Freq: Every day | ORAL | Status: DC
  Administered 2024-04-03 – 2024-04-04 (×2): 100 mg via ORAL
  Filled 2024-04-03 (×2): qty 2

## 2024-04-03 MED ORDER — INSULIN ASPART 100 UNIT/ML IJ SOLN: 0.0000 [IU] | Freq: Three times a day (TID) | INTRAMUSCULAR | Status: DC

## 2024-04-03 MED ORDER — ENOXAPARIN SODIUM 40 MG/0.4ML IJ SOSY: 40.0000 mg | PREFILLED_SYRINGE | INTRAMUSCULAR | Status: DC

## 2024-04-03 MED ORDER — ASPIRIN 81 MG PO TBEC: 81.0000 mg | DELAYED_RELEASE_TABLET | Freq: Every day | ORAL | Status: DC

## 2024-04-03 MED ORDER — IOHEXOL 350 MG/ML SOLN
75.0000 mL | Freq: Once | INTRAVENOUS | Status: AC | PRN
  Administered 2024-04-03: 75 mL via INTRAVENOUS

## 2024-04-03 MED ORDER — ONDANSETRON HCL 4 MG/2ML IJ SOLN
4.0000 mg | Freq: Four times a day (QID) | INTRAMUSCULAR | Status: DC | PRN
  Administered 2024-04-04: 4 mg via INTRAVENOUS
  Filled 2024-04-03: qty 2

## 2024-04-03 MED ORDER — MORPHINE SULFATE (PF) 4 MG/ML IV SOLN
4.0000 mg | Freq: Once | INTRAVENOUS | Status: AC
  Administered 2024-04-03: 4 mg via INTRAVENOUS
  Filled 2024-04-03: qty 1

## 2024-04-03 MED ORDER — CLOPIDOGREL BISULFATE 75 MG PO TABS
75.0000 mg | ORAL_TABLET | Freq: Every day | ORAL | Status: DC
  Administered 2024-04-04 – 2024-04-05 (×2): 75 mg via ORAL
  Filled 2024-04-03 (×2): qty 1

## 2024-04-03 MED ORDER — ROSUVASTATIN CALCIUM 20 MG PO TABS
40.0000 mg | ORAL_TABLET | Freq: Every day | ORAL | Status: DC
  Administered 2024-04-03 – 2024-04-05 (×3): 40 mg via ORAL
  Filled 2024-04-03 (×3): qty 2

## 2024-04-03 MED ORDER — PIPERACILLIN-TAZOBACTAM 3.375 G IVPB 30 MIN
3.3750 g | Freq: Once | INTRAVENOUS | Status: AC
  Administered 2024-04-03: 3.375 g via INTRAVENOUS
  Filled 2024-04-03: qty 50

## 2024-04-03 MED ORDER — LACTULOSE 10 GM/15ML PO SOLN
30.0000 g | Freq: Once | ORAL | Status: AC
  Administered 2024-04-03: 30 g via ORAL
  Filled 2024-04-03: qty 45

## 2024-04-03 MED ORDER — SERTRALINE HCL 100 MG PO TABS
100.0000 mg | ORAL_TABLET | Freq: Every day | ORAL | Status: DC
  Administered 2024-04-03 – 2024-04-04 (×2): 100 mg via ORAL
  Filled 2024-04-03 (×2): qty 1

## 2024-04-03 MED ORDER — PIPERACILLIN-TAZOBACTAM 3.375 G IVPB
3.3750 g | Freq: Three times a day (TID) | INTRAVENOUS | Status: DC
  Administered 2024-04-03 – 2024-04-04 (×2): 3.375 g via INTRAVENOUS
  Filled 2024-04-03 (×2): qty 50

## 2024-04-03 MED ORDER — HYDROMORPHONE HCL 1 MG/ML IJ SOLN
0.5000 mg | INTRAMUSCULAR | Status: DC | PRN
  Filled 2024-04-03 (×2): qty 1

## 2024-04-03 MED ORDER — HYDROXYZINE PAMOATE 25 MG PO CAPS: 25.0000 mg | ORAL_CAPSULE | Freq: Three times a day (TID) | ORAL | Status: DC | PRN

## 2024-04-03 MED ORDER — MAGNESIUM SULFATE 2 GM/50ML IV SOLN
2.0000 g | Freq: Once | INTRAVENOUS | Status: AC
  Administered 2024-04-03: 2 g via INTRAVENOUS
  Filled 2024-04-03: qty 50

## 2024-04-03 MED ORDER — LACTATED RINGERS IV BOLUS
1000.0000 mL | Freq: Once | INTRAVENOUS | Status: AC
  Administered 2024-04-03: 1000 mL via INTRAVENOUS

## 2024-04-03 MED ORDER — INSULIN ASPART 100 UNIT/ML IJ SOLN: 0.0000 [IU] | Freq: Every day | INTRAMUSCULAR | Status: DC

## 2024-04-03 MED ORDER — ONDANSETRON HCL 4 MG PO TABS: 4.0000 mg | ORAL_TABLET | Freq: Four times a day (QID) | ORAL | Status: DC | PRN

## 2024-04-03 NOTE — ED Triage Notes (Signed)
 BIB [facility] from home for Abd pain N/V & headache. Gallstones removed 03/14/24 and drained placed. 7/10 . Hass 50 mcg Fent & 4mg  zofran  20 170s systolic. A&O x4

## 2024-04-03 NOTE — ED Provider Notes (Signed)
 [place] EMERGENCY DEPARTMENT AT [facility] Provider Note   CSN: [id number] Arrival date & time: 04/03/24  1418     Patient presents with: Abdominal Pain and Nausea   Jerry [name] is a 68 y.o. male who presents to the ED today with concerns of upper abdominal pain, vomiting x 3, as well as 1 episode of loose stools.  Seen by GI yesterday and had ERCP with common bile duct stenting.  Further noted that back on March 30, 2024, patient had laparoscopic cholecystectomy with placement of drain which she still has and is appropriately draining.  Per pt's spouse, there was purulent drainage into the drain as of last night.  States that approximately 3:00 this morning current pain episode began along with the associated nausea and vomiting.  Presents still nauseated and is continuing to have moderate pain in the upper abdomen.  Prior to arrival to the ED EMS provided him with 50 mcg of fentanyl  IV along with 4 mg Zofran  IV.  Previous medical diagnoses are of type 2 diabetes, hyperlipidemia, hypertension, osteoarthritis, microcytic anemia, known previous CVA, depression.    Abdominal Pain Associated symptoms: diarrhea, nausea and vomiting        Prior to Admission medications   Medication Sig Start Date End Date Taking? Authorizing Provider  acetaminophen  (TYLENOL ) 500 MG tablet Take 1,000 mg by mouth every 6 (six) hours as needed for mild pain.    [provider]  albuterol (VENTOLIN HFA) 108 (90 Base) MCG/ACT inhaler SMARTSIG:2 Puff(s) By Mouth Every 4 Hours PRN 10/05/23   [provider]  amLODipine  (NORVASC ) 10 MG tablet Take 10 mg by mouth daily.    [provider]  aspirin  EC 81 MG tablet Take 1 tablet (81 mg total) by mouth daily. Swallow whole. 06/04/22   Alvan Ronal BRAVO, MD  bisacodyl  (DULCOLAX) 10 MG suppository Place 1 suppository (10 mg total) rectally daily as needed for moderate constipation. 03/16/24   Patel, Pranav M, MD  Calcium   Carbonate Antacid (TUMS PO) Take 2 tablets by mouth daily as needed (reflux).    [provider]  clopidogrel  (PLAVIX ) 75 MG tablet Take 1 tablet (75 mg total) by mouth daily. 06/04/22   Alvan Ronal BRAVO, MD  cyclobenzaprine (FLEXERIL) 10 MG tablet Take 10 mg by mouth daily as needed for muscle spasms. 12/27/23   [provider]  ezetimibe  (ZETIA ) 10 MG tablet Take 1 tablet (10 mg total) by mouth daily. 03/16/24   Tobie Yetta HERO, MD  HYDROcodone -acetaminophen  (NORCO) 7.5-325 MG tablet Take 1-2 tablets by mouth daily as needed for moderate pain (pain score 4-6) or severe pain (pain score 7-10).    [provider]  hydrOXYzine  (VISTARIL ) 25 MG capsule Take 1 capsule (25 mg total) by mouth 3 (three) times daily as needed for anxiety. 03/29/24   Hoang, Daniela B, MD  ibuprofen (ADVIL) 200 MG tablet Take 200-400 mg by mouth daily as needed for headache, moderate pain (pain score 4-6) or mild pain (pain score 1-3).    [provider]  Insulin  Glargine (LANTUS  SOLOSTAR) 100 UNIT/ML Solostar Pen Inject 15 Units into the skin 2 (two) times daily. Patient not taking: Reported on 03/11/2024 12/28/14   Melodi Lerner, MD  ketorolac  (ACULAR ) 0.5 % ophthalmic solution Place 1 drop into the right eye as needed (every 3 months after eye injection).    [provider]  melatonin 5 MG TABS Take 5 mg by mouth at bedtime as needed (sleep).  [provider]  metFORMIN  (GLUCOPHAGE ) 1000 MG tablet Take 1 tablet (1,000 mg total) by mouth daily. Patient taking differently: Take 1,000 mg by mouth 2 (two) times daily with a meal. 12/28/14   Aluisio, Dempsey, MD  MOUNJARO 15 MG/0.5ML Pen Inject 15 mg into the skin once a week. Mondays    [provider]  oxyCODONE  (OXY IR/ROXICODONE ) 5 MG immediate release tablet Take 1 tablet (5 mg total) by mouth every 4 (four) hours as needed for moderate pain (pain score 4-6) or severe pain (pain score 7-10). 03/16/24   Vicci Burnard SAUNDERS,  PA-C  rosuvastatin  (CRESTOR ) 40 MG tablet Take 1 tablet (40 mg total) by mouth daily. 03/16/24   Tobie Yetta HERO, MD  sertraline  (ZOLOFT ) 100 MG tablet Take 1 tablet (100 mg total) by mouth in the morning and at bedtime. 03/29/24   Izella Ismael NOVAK, MD  Study [facility] - asundexian 50 mg or placebo tablet (PI-Sethi) Take 1 tablet (50 mg total) by mouth daily. For Investigational Use Only. Take at the same time each day (preferably in the morning). Tablet should be swallowed whole with water; it CANNOT be crushed or broken. Please contact [facility] if you have any questions regarding this medication or study. 10/27/23   Rosemarie Eather RAMAN, MD  traZODone  (DESYREL ) 100 MG tablet Take 1 tablet (100 mg total) by mouth at bedtime. 03/29/24   Izella Ismael NOVAK, MD    Allergies: Diclofenac  sodium, Lisinopril , and Losartan     Review of Systems  Gastrointestinal:  Positive for abdominal pain, diarrhea, nausea and vomiting.  All other systems reviewed and are negative.   Updated Vital Signs BP (!) 156/99   Pulse 90   Temp 98.4 F (36.9 C) (Oral)   Resp 17   SpO2 100%   Physical Exam Vitals and nursing note reviewed.  Constitutional:      General: He is not in acute distress.    Appearance: Normal appearance. He is well-developed.  HENT:     Head: Normocephalic and atraumatic.     Mouth/Throat:     Mouth: Mucous membranes are moist.     Pharynx: Oropharynx is clear.  Eyes:     Extraocular Movements: Extraocular movements intact.     Conjunctiva/sclera: Conjunctivae normal.     Pupils: Pupils are equal, round, and reactive to light.  Cardiovascular:     Rate and Rhythm: Normal rate and regular rhythm.     Pulses: Normal pulses.     Heart sounds: Normal heart sounds. No murmur heard.    No friction rub. No gallop.  Pulmonary:     Effort: Pulmonary effort is normal.     Breath sounds: Normal breath sounds.  Abdominal:     General: Abdomen is flat. Bowel sounds are  normal.     Palpations: Abdomen is soft.     Tenderness: There is generalized abdominal tenderness.  Musculoskeletal:        General: Normal range of motion.     Cervical back: Normal range of motion and neck supple.     Right lower leg: No edema.     Left lower leg: No edema.  Skin:    General: Skin is warm and dry.     Capillary Refill: Capillary refill takes less than 2 seconds.  Neurological:     General: No focal deficit present.     Mental Status: He is alert and oriented to person, place, and time. Mental status is at baseline.  Psychiatric:  Mood and Affect: Mood normal.        Behavior: Behavior normal.     (all labs ordered are listed, but only abnormal results are displayed) Labs Reviewed  CBC WITH DIFFERENTIAL/PLATELET - Abnormal; Notable for the following components:      Result Value   WBC 15.5 (*)    RBC 2.82 (*)    Hemoglobin 9.9 (*)    HCT 27.7 (*)    MCH 35.1 (*)    Neutro Abs 12.4 (*)    Monocytes Absolute 1.4 (*)    All other components within normal limits  URINALYSIS, ROUTINE W REFLEX MICROSCOPIC - Abnormal; Notable for the following components:   APPearance HAZY (*)    All other components within normal limits  COMPREHENSIVE METABOLIC PANEL WITH GFR - Abnormal; Notable for the following components:   CO2 19 (*)    BUN 6 (*)    Creatinine, Ser 0.49 (*)    Calcium  7.3 (*)    Total Protein 5.2 (*)    Albumin 2.3 (*)    Total Bilirubin 1.4 (*)    All other components within normal limits  MAGNESIUM  - Abnormal; Notable for the following components:   Magnesium  1.5 (*)    All other components within normal limits  I-STAT CHEM 8, ED - Abnormal; Notable for the following components:   Sodium 131 (*)    Potassium 6.3 (*)    Creatinine, Ser 0.60 (*)    Glucose, Bld 132 (*)    Calcium , Ion 1.07 (*)    All other components within normal limits  LIPASE, BLOOD  I-STAT CG4 LACTIC ACID, ED  TYPE AND SCREEN    EKG: None  Radiology: CT ABDOMEN  PELVIS W CONTRAST Result Date: 04/03/2024 CLINICAL DATA:  Postop abdominal pain.  Cholecystectomy 03/14/2024. EXAM: CT ABDOMEN AND PELVIS WITH CONTRAST TECHNIQUE: Multidetector CT imaging of the abdomen and pelvis was performed using the standard protocol following bolus administration of intravenous contrast. RADIATION DOSE REDUCTION: This exam was performed according to the departmental dose-optimization program which includes automated exposure control, adjustment of the mA and/or kV according to patient size and/or use of iterative reconstruction technique. CONTRAST:  75mL OMNIPAQUE  IOHEXOL  350 MG/ML SOLN COMPARISON:  Abdominopelvic CT 03/10/2024. FINDINGS: Lower chest: Mild linear atelectasis or scarring at both lung bases. There is no confluent airspace disease or significant pleural effusion. Aortic and coronary artery atherosclerosis noted. Hepatobiliary: The liver is normal in density without suspicious focal abnormality. Interval cholecystectomy and plastic biliary stent placement. The stent appears well positioned. There is a small amount of pneumobilia and no significant biliary dilatation. There is an ill-defined collection of gas and fluid within the cholecystectomy bed, measuring approximately 6.4 x 3.5 cm on image 34/5. Pancreas: Unremarkable. No pancreatic ductal dilatation or surrounding inflammatory changes. Spleen: Normal in size without focal abnormality. Adrenals/Urinary Tract: Both adrenal glands appear normal. No evidence of urinary tract calculus, suspicious renal lesion or hydronephrosis. Stable small renal cysts bilaterally for which no specific follow-up imaging is recommended. Stable mild bladder wall thickening without surrounding inflammation. Stomach/Bowel: No enteric contrast administered. The stomach appears unremarkable for its degree of distension. No evidence of bowel wall thickening, distention or surrounding inflammatory change. Prior appendectomy. Moderate stool throughout  the colon. Vascular/Lymphatic: There are no enlarged abdominal or pelvic lymph nodes. Aortic and branch vessel atherosclerosis without evidence of aneurysm or large vessel occlusion. The portal, superior mesenteric and splenic veins are patent. Reproductive: Stable mild enlargement of the prostate gland. Other: Stable  fat containing left inguinal hernia. Postsurgical changes at the umbilicus. A surgical drain is present within the subhepatic space and traverses the previously-described air-fluid collection in the cholecystectomy bed. No other intra-abdominal fluid collections are identified. No ascites or pneumoperitoneum. Musculoskeletal: No acute or significant osseous findings. Multilevel thoracolumbar scoliosis. Partially ankylosing osteophytes of the sacroiliac joints bilaterally. IMPRESSION: 1. Interval cholecystectomy and plastic biliary stent placement. There is an ill-defined collection of gas and fluid within the cholecystectomy bed which could reflect a postoperative seroma, biloma or abscess. A surgical drain traverses this collection. 2. No other acute findings or explanation for the patient's symptoms. 3. Stable mild bladder wall thickening and prostatomegaly. 4.  Aortic Atherosclerosis (ICD10-I70.0). Electronically Signed   By: Elsie Perone M.D.   On: 04/03/2024 17:41   DG ERCP Result Date: 04/02/2024 CLINICAL DATA:  [id number] Surgery, elective [id number] ERCP, WITH INTERVENTION IF INDICATED INSERTION, STENT, PANCREATIC DUCT STENT REMOVAL INSERTION, STENT EXAM: ERCP COMPARISON:  CT AP, 03/10/2024.  US  Abdomen, 03/10/2024. FLUOROSCOPY: Exposure Index (as provided by the fluoroscopic device): 103.5 mGy Kerma FINDINGS: Limited single planar images of the RIGHT upper quadrant obtained C-arm. Image demonstrating flexible endoscopy, biliary duct cannulation, and retrograde cholangiogram. No biliary ductal dilation. No evidence of biliary filling defect is demonstrated. IMPRESSION: Fluoroscopic imaging for  ERCP. For complete description of intra procedural findings, please see performing service dictation. Electronically Signed   By: Thom Hall M.D.   On: 04/02/2024 10:31   DG C-Arm 1-60 Min-No Report Result Date: 04/02/2024 Fluoroscopy was utilized by the requesting physician.  No radiographic interpretation.     Procedures   Medications Ordered in the ED  HYDROmorphone  (DILAUDID ) injection 0.5 mg (has no administration in time range)  magnesium  sulfate IVPB 2 g 50 mL (2 g Intravenous New Bag/Given 04/03/24 1938)  amLODipine  (NORVASC ) tablet 10 mg (10 mg Oral Given 04/03/24 1941)  lactated ringers  bolus 1,000 mL (0 mLs Intravenous Stopped 04/03/24 1929)  morphine  (PF) 4 MG/ML injection 4 mg (4 mg Intravenous Given 04/03/24 1508)  ondansetron  (ZOFRAN ) injection 4 mg (4 mg Intravenous Given 04/03/24 1600)  piperacillin -tazobactam (ZOSYN ) IVPB 3.375 g (0 g Intravenous Stopped 04/03/24 1605)  iohexol  (OMNIPAQUE ) 350 MG/ML injection 75 mL (75 mLs Intravenous Contrast Given 04/03/24 1704)                                    Medical Decision Making Amount and/or Complexity of Data Reviewed Labs: ordered. Radiology: ordered.  Risk Prescription drug management. Decision regarding hospitalization.   Medical Decision Making:   Jerry [name] is a 68 y.o. male who presented to the ED today with upper abdominal pain along with nausea and vomiting detailed above.    External chart has been reviewed including previous procedure notes, labs, imaging. Patient's presentation is complicated by their history of previous cholecystectomy and ERCP performed yesterday.  Patient placed on continuous vitals and telemetry monitoring while in ED which was reviewed periodically.  Complete initial physical exam performed, notably the patient  was alert and oriented in no apparent distress.  Generalized tenderness appreciated to the abdomen.    Reviewed and confirmed nursing documentation for past medical history,  family history, social history.    Initial Assessment:   With the patient's presentation of abdominal pain along with nausea and vomiting, given postprocedure, concern for complication postprocedure, pancreatitis, peritonitis, bowel obstruction.   Initial Plan:  Provide IV morphine  to manage pain  Screening labs including CBC and Metabolic panel to evaluate for infectious or metabolic etiology of disease.  Include serum magnesium . Evaluate serum lactate to evaluate tissue perfusion status. Evaluate UA to determine urologic etiology. Include serum lipase to evaluate for pancreatic pathology EKG to evaluate for cardiac pathology CT abdomen pelvis secondary to new onset of pain in the upper abdomen with recent ERCP, evaluate for complications. Objective evaluation as below reviewed   Initial Study Results:   Laboratory  All laboratory results reviewed without evidence of clinically relevant pathology.   Exceptions include: Creatinine 0.49 with BUN of 6 though GFR is greater than 60.  Decreased total protein 5.2, leukocytosis of 15.5, hemoglobin is decreased to 9.9, noted interval change from 2 weeks ago was 12.7.  Magnesium  1.5, stable when compared to interval.  EKG EKG was reviewed independently. Rate, rhythm, axis, intervals all examined and without medically relevant abnormality. ST segments without concerns for elevations.    Radiology:  All images reviewed independently. Agree with radiology report at this time.   CT ABDOMEN PELVIS W CONTRAST Result Date: 04/03/2024 CLINICAL DATA:  Postop abdominal pain.  Cholecystectomy 03/14/2024. EXAM: CT ABDOMEN AND PELVIS WITH CONTRAST TECHNIQUE: Multidetector CT imaging of the abdomen and pelvis was performed using the standard protocol following bolus administration of intravenous contrast. RADIATION DOSE REDUCTION: This exam was performed according to the departmental dose-optimization program which includes automated exposure control, adjustment  of the mA and/or kV according to patient size and/or use of iterative reconstruction technique. CONTRAST:  75mL OMNIPAQUE  IOHEXOL  350 MG/ML SOLN COMPARISON:  Abdominopelvic CT 03/10/2024. FINDINGS: Lower chest: Mild linear atelectasis or scarring at both lung bases. There is no confluent airspace disease or significant pleural effusion. Aortic and coronary artery atherosclerosis noted. Hepatobiliary: The liver is normal in density without suspicious focal abnormality. Interval cholecystectomy and plastic biliary stent placement. The stent appears well positioned. There is a small amount of pneumobilia and no significant biliary dilatation. There is an ill-defined collection of gas and fluid within the cholecystectomy bed, measuring approximately 6.4 x 3.5 cm on image 34/5. Pancreas: Unremarkable. No pancreatic ductal dilatation or surrounding inflammatory changes. Spleen: Normal in size without focal abnormality. Adrenals/Urinary Tract: Both adrenal glands appear normal. No evidence of urinary tract calculus, suspicious renal lesion or hydronephrosis. Stable small renal cysts bilaterally for which no specific follow-up imaging is recommended. Stable mild bladder wall thickening without surrounding inflammation. Stomach/Bowel: No enteric contrast administered. The stomach appears unremarkable for its degree of distension. No evidence of bowel wall thickening, distention or surrounding inflammatory change. Prior appendectomy. Moderate stool throughout the colon. Vascular/Lymphatic: There are no enlarged abdominal or pelvic lymph nodes. Aortic and branch vessel atherosclerosis without evidence of aneurysm or large vessel occlusion. The portal, superior mesenteric and splenic veins are patent. Reproductive: Stable mild enlargement of the prostate gland. Other: Stable fat containing left inguinal hernia. Postsurgical changes at the umbilicus. A surgical drain is present within the subhepatic space and traverses the  previously-described air-fluid collection in the cholecystectomy bed. No other intra-abdominal fluid collections are identified. No ascites or pneumoperitoneum. Musculoskeletal: No acute or significant osseous findings. Multilevel thoracolumbar scoliosis. Partially ankylosing osteophytes of the sacroiliac joints bilaterally. IMPRESSION: 1. Interval cholecystectomy and plastic biliary stent placement. There is an ill-defined collection of gas and fluid within the cholecystectomy bed which could reflect a postoperative seroma, biloma or abscess. A surgical drain traverses this collection. 2. No other acute findings or explanation for the patient's symptoms. 3. Stable mild bladder wall thickening and  prostatomegaly. 4.  Aortic Atherosclerosis (ICD10-I70.0). Electronically Signed   By: Elsie Perone M.D.   On: 04/03/2024 17:41    Consults: Case discussed with [facility], consulted with Dr. Sundil with hospitalist team, consulted with [facility] surgery.   Reassessment and Plan:   Imaging does show a collection of gas and fluid within the cholecystectomy bed indicating possible seroma or abscess.  There is a leukocytosis that is appreciated, as such IV Zosyn  was begun, patient has had also management for mild hypomagnesia, ongoing pain management with morphine  and Dilaudid .  Consulted with [facility] and they advised to consult with surgery and bring patient in for continued IV antibiotics.  Their plan is to round on this patient tomorrow morning.  Consulted with Dr. Sundil with the hospitalist team, they will accept this patient for admission for continued IV antibiotics.  Consulted with [facility] surgery, they are aware of case and will continue to follow.  At this time surgery had no further orders for this patient, patient will be admitted to hospitalist service with surgery and GI rounding on patient.       Final diagnoses:  Right upper quadrant abdominal pain  Postoperative surgical  complication involving digestive system associated with digestive system procedure, unspecified complication    ED Discharge Orders     None          Myriam Dorn BROCKS, PA 04/03/24 2011    Melvenia Motto, MD 04/03/24 2044

## 2024-04-03 NOTE — H&P (Addendum)
 History and Physical    Jerry [name] FMW:[id number] DOB: [birth year] DOA: 04/03/2024  PCP: Lazoff, Shawn P, DO   Patient coming from: Home   Chief Complaint:  Chief Complaint  Patient presents with   Abdominal Pain   Nausea   ED TRIAGE note:BIB [facility] from home for Abd pain N/V & headache. Gallstones removed 03/14/24 and drained placed. 7/10 . Hass 50 mcg Fent & 4mg  zofran  20 170s systolic. A&O x4  HPI:  Jerry [name] is a 68 y.o. male with medical history significant of recent gangrenous cholecystitis underwent partial cholecystectomy 9/7 with JP drain followed by biliary leak required ERCP with common bile duct stent placement 9/26, hypertension, DM type II, cryptogenic stroke-on aspirin  and Plavix , hyperlipidemia, chronic hyponatremia, chronic macrocytosis, presented to emergency department complaining of abdominal pain, nausea vomiting and 1 episode of loose stool in last 24 hours. Per patient and spouse at the bedside patient has purulent discharge into the drain since last night.  States that approximately 3:00 this morning current pain episode began along with the associated nausea and vomiting.  Presents still nauseated and is continuing to have moderate pain in the upper abdomen.  Prior to arrival to the ED EMS provided him with 50 mcg of fentanyl  IV along with 4 mg Zofran  IV.   Patient reported that the initial JP drain has been placed on 9/7 and initially the patient has around 80 cc of output which need to be drained 3-4 times daily however for last 1 week patient has 50 cc output and emptying the drain once daily.  The fluid color is greenish-black however patient's wife reported that for last 1 day it is smelling foul.  Patient did not have any fever chill but having sweating at home.  Patient is also concerned that the JP drain tube size is very short and requesting for a longer outside tube drain replacement.  No other complaint at this time.   ED Course:  At  presentation to ED patient is hypertensive otherwise hemodynamically stable.  Afebrile. Lab work, CBC showing leukocytosis 15.5, low hemoglobin 9.9 (baseline hemoglobin 12-13) and normal platelet count. CMP showing sodium 135, potassium 3.6, low bicarb 16, normal AST/ALT, elevated bilirubin 1.5.  Pending lipase level.  Low mag level 1.5.  Lactic acid within normal range 1.7.  CT abdomen pelvis showing: IMPRESSION: 1. Interval cholecystectomy and plastic biliary stent placement. There is an ill-defined collection of gas and fluid within the cholecystectomy bed which could reflect a postoperative seroma, biloma or abscess. A surgical drain traverses this collection. 2. No other acute findings or explanation for the patient's symptoms. 3. Stable mild bladder wall thickening and prostatomegaly. 4.  Aortic Atherosclerosis (ICD10-I70.0).  In the ED patient received Zosyn  for intra-abdominal infection, Dilaudid , 1 L of LR bolus, mag sulfate, morphine , Zofran , and amlodipine  10 mg.    Patient already has a JP drain in place.  ED physician already reach out to [facility] recommended to treat with IV antibiotic and consult general surgery and GI team will see patient tomorrow.  EDP waiting for general surgery recommendation.  Hospitalist has been consulted for further evaluation management of concern for intra-abdominal.  Significant labs in the ED: Lab Orders         Culture, blood (Routine X 2) w Reflex to ID Panel         CBC with Diff         Urinalysis, Routine w reflex microscopic -Urine, Clean Catch  Comprehensive metabolic panel with GFR         Magnesium          CBC         Comprehensive metabolic panel         Occult blood card to lab, stool         Lipase, blood         I-Stat Lactic Acid, ED         I-stat chem 8, ED       Review of Systems:  Review of Systems  Constitutional:  Negative for chills, fever and weight loss.  Respiratory:  Negative for cough.    Cardiovascular:  Negative for chest pain.  Gastrointestinal:  Positive for abdominal pain, nausea and vomiting. Negative for blood in stool, constipation, heartburn and melena.  Musculoskeletal:  Negative for myalgias.  Neurological:  Negative for dizziness and headaches.  Psychiatric/Behavioral:  The patient is not nervous/anxious.     Past Medical History:  Diagnosis Date   Arthritis    Diabetes mellitus 2008   type II   Dyslipidemia    Erectile dysfunction    History of cardiac catheterization 06/2013   Dr. Peter Swaziland   HTN (hypertension)    Hypogonadism male     Past Surgical History:  Procedure Laterality Date   APPENDECTOMY     BUBBLE STUDY  11/02/2021   Procedure: BUBBLE STUDY;  Surgeon: Alvan Ronal BRAVO, MD;  Location: [facility] ENDOSCOPY;  Service: Cardiovascular;;   CHOLECYSTECTOMY N/A 03/14/2024   Procedure: LAPAROSCOPIC CHOLECYSTECTOMY;  Surgeon: Dasie Leonor CROME, MD;  Location: [facility] OR;  Service: General;  Laterality: N/A;   COLONOSCOPY  08/2006   Dr. Aneita - repeat 2018   INCISION AND DRAINAGE PERIRECTAL ABSCESS     INDOCYANINE GREEN  FLUORESCENCE IMAGING (ICG) N/A 03/14/2024   Procedure: INDOCYANINE GREEN  FLUORESCENCE IMAGING (ICG);  Surgeon: Dasie Leonor CROME, MD;  Location: [facility] OR;  Service: General;  Laterality: N/A;   KNEE ARTHROSCOPY Left 12/28/2014   Procedure: ARTHROSCOPY LEFT KNEE WITH  MEDIAL MENSICUS DEBRIDEMENT;  Surgeon: Dempsey Moan, MD;  Location: [facility] ORS;  Service: Orthopedics;  Laterality: Left;   KNEE SURGERY     left knee - remote past   LEFT HEART CATHETERIZATION WITH CORONARY ANGIOGRAM N/A 06/11/2013   Procedure: LEFT HEART CATHETERIZATION WITH CORONARY ANGIOGRAM;  Surgeon: Peter M Swaziland, MD;  Location: [facility] CATH LAB;  Service: Cardiovascular;  Laterality: N/A;   LOOP RECORDER INSERTION N/A 10/22/2021   Procedure: LOOP RECORDER INSERTION;  Surgeon: Fernande Elspeth BROCKS, MD;  Location: [facility] INVASIVE CV LAB;  Service: Cardiovascular;  Laterality: N/A;   right arm and elbow  surgery     TEE WITHOUT CARDIOVERSION N/A 11/02/2021   Procedure: TRANSESOPHAGEAL ECHOCARDIOGRAM (TEE);  Surgeon: Alvan Ronal BRAVO, MD;  Location: [facility] ENDOSCOPY;  Service: Cardiovascular;  Laterality: N/A;   TONSILLECTOMY     TOTAL KNEE ARTHROPLASTY Left 10/04/2015   Procedure: LEFT TOTAL KNEE ARTHROPLASTY right knee aspiration and injection;  Surgeon: Dempsey Moan, MD;  Location: [facility] ORS;  Service: Orthopedics;  Laterality: Left;   TOTAL KNEE ARTHROPLASTY Right 11/04/2016   Procedure: RIGHT TOTAL KNEE ARTHROPLASTY;  Surgeon: Dempsey Moan, MD;  Location: [facility] ORS;  Service: Orthopedics;  Laterality: Right;  with abductor block   wisdom teeth extractions       reports that he has quit smoking. His smoking use included cigarettes. He has never used smokeless tobacco. He reports that he does not drink alcohol and does not use drugs.  Allergies  Allergen Reactions   Diclofenac  Sodium Other (See Comments)    Gel caused a burning sensation    Lisinopril  Cough   Losartan  Cough    Unknown bad side effects     Family History  Problem Relation Age of Onset   Diabetes Mother    Other Father        death by MVA   Other Brother        died of MVA   Stroke Neg Hx    Heart disease Neg Hx    Cancer Neg Hx    Hypertension Neg Hx     Prior to Admission medications   Medication Sig Start Date End Date Taking? Authorizing Provider  acetaminophen  (TYLENOL ) 500 MG tablet Take 1,000 mg by mouth every 6 (six) hours as needed for mild pain.    [provider]  albuterol (VENTOLIN HFA) 108 (90 Base) MCG/ACT inhaler SMARTSIG:2 Puff(s) By Mouth Every 4 Hours PRN 10/05/23   [provider]  amLODipine  (NORVASC ) 10 MG tablet Take 10 mg by mouth daily.    [provider]  aspirin  EC 81 MG tablet Take 1 tablet (81 mg total) by mouth daily. Swallow whole. 06/04/22   Alvan Ronal BRAVO, MD  bisacodyl  (DULCOLAX) 10 MG suppository Place 1 suppository (10 mg total) rectally daily as needed for  moderate constipation. 03/16/24   Patel, Pranav M, MD  Calcium  Carbonate Antacid (TUMS PO) Take 2 tablets by mouth daily as needed (reflux).    [provider]  clopidogrel  (PLAVIX ) 75 MG tablet Take 1 tablet (75 mg total) by mouth daily. 06/04/22   Alvan Ronal BRAVO, MD  cyclobenzaprine (FLEXERIL) 10 MG tablet Take 10 mg by mouth daily as needed for muscle spasms. 12/27/23   [provider]  ezetimibe  (ZETIA ) 10 MG tablet Take 1 tablet (10 mg total) by mouth daily. 03/16/24   Tobie Yetta HERO, MD  HYDROcodone -acetaminophen  (NORCO) 7.5-325 MG tablet Take 1-2 tablets by mouth daily as needed for moderate pain (pain score 4-6) or severe pain (pain score 7-10).    [provider]  hydrOXYzine  (VISTARIL ) 25 MG capsule Take 1 capsule (25 mg total) by mouth 3 (three) times daily as needed for anxiety. 03/29/24   Hoang, Daniela B, MD  ibuprofen (ADVIL) 200 MG tablet Take 200-400 mg by mouth daily as needed for headache, moderate pain (pain score 4-6) or mild pain (pain score 1-3).    [provider]  Insulin  Glargine (LANTUS  SOLOSTAR) 100 UNIT/ML Solostar Pen Inject 15 Units into the skin 2 (two) times daily. Patient not taking: Reported on 03/11/2024 12/28/14   Melodi Lerner, MD  ketorolac  (ACULAR ) 0.5 % ophthalmic solution Place 1 drop into the right eye as needed (every 3 months after eye injection).    [provider]  melatonin 5 MG TABS Take 5 mg by mouth at bedtime as needed (sleep).    [provider]  metFORMIN  (GLUCOPHAGE ) 1000 MG tablet Take 1 tablet (1,000 mg total) by mouth daily. Patient taking differently: Take 1,000 mg by mouth 2 (two) times daily with a meal. 12/28/14   Aluisio, Lerner, MD  MOUNJARO 15 MG/0.5ML Pen Inject 15 mg into the skin once a week. Mondays    [provider]  oxyCODONE  (OXY IR/ROXICODONE ) 5 MG immediate release tablet Take 1 tablet (5 mg total) by mouth every 4 (four) hours as needed for moderate pain (pain score 4-6) or  severe pain (pain score 7-10). 03/16/24   Vicci Burnard SAUNDERS,  PA-C  rosuvastatin  (CRESTOR ) 40 MG tablet Take 1 tablet (40 mg total) by mouth daily. 03/16/24   Tobie Yetta HERO, MD  sertraline  (ZOLOFT ) 100 MG tablet Take 1 tablet (100 mg total) by mouth in the morning and at bedtime. 03/29/24   Izella Ismael NOVAK, MD  Study [facility] PICKLER - asundexian 50 mg or placebo tablet (PI-Sethi) Take 1 tablet (50 mg total) by mouth daily. For Investigational Use Only. Take at the same time each day (preferably in the morning). Tablet should be swallowed whole with water; it CANNOT be crushed or broken. Please contact [facility] if you have any questions regarding this medication or study. 10/27/23   Rosemarie Eather RAMAN, MD  traZODone  (DESYREL ) 100 MG tablet Take 1 tablet (100 mg total) by mouth at bedtime. 03/29/24   Izella Ismael NOVAK, MD     Physical Exam: Vitals:   04/03/24 1430 04/03/24 1744 04/03/24 1900  BP: (!) 167/97 (!) 153/90 (!) 156/99  Pulse: 92 86 90  Resp: 19 17 17   Temp: 97.7 F (36.5 C) 98.4 F (36.9 C)   TempSrc: Oral Oral   SpO2: 100% 100% 100%    Physical Exam Vitals and nursing note reviewed.  Constitutional:      Appearance: He is ill-appearing.  Cardiovascular:     Rate and Rhythm: Normal rate and regular rhythm.     Heart sounds: Normal heart sounds.  Abdominal:     General: Abdomen is flat. Bowel sounds are normal. There is no distension.     Palpations: Abdomen is soft.     Tenderness: There is abdominal tenderness in the right upper quadrant. There is no guarding or rebound.     Comments: JP drain 30 cc of greenish-black drainage and foul smelling  Skin:    Capillary Refill: Capillary refill takes less than 2 seconds.  Neurological:     Mental Status: He is alert and oriented to person, place, and time.      Labs on Admission: I have personally reviewed following labs and imaging studies  CBC: Recent Labs  Lab 04/03/24 1450 04/03/24 1523  WBC 15.5*  --    NEUTROABS 12.4*  --   HGB 9.9* 13.6  HCT 27.7* 40.0  MCV 98.2  --   PLT 325  --    Basic Metabolic Panel: Recent Labs  Lab 04/03/24 1523 04/03/24 1650  NA 131* 135  K 6.3* 3.6  CL 98 109  CO2  --  19*  GLUCOSE 132* 76  BUN 8 6*  CREATININE 0.60* 0.49*  CALCIUM   --  7.3*  MG  --  1.5*   GFR: Estimated Creatinine Clearance: 108.5 mL/min (A) (by C-G formula based on SCr of 0.49 mg/dL (L)). Liver Function Tests: Recent Labs  Lab 04/03/24 1650  AST 20  ALT 11  ALKPHOS 44  BILITOT 1.4*  PROT 5.2*  ALBUMIN 2.3*   No results for input(s): LIPASE, AMYLASE in the last 168 hours. No results for input(s): AMMONIA in the last 168 hours. Coagulation Profile: No results for input(s): INR, PROTIME in the last 168 hours. Cardiac Enzymes: No results for input(s): CKTOTAL, CKMB, CKMBINDEX, TROPONINI, TROPONINIHS in the last 168 hours. BNP (last 3 results) No results for input(s): BNP in the last 8760 hours. HbA1C: No results for input(s): HGBA1C in the last 72 hours. CBG: Recent Labs  Lab 04/02/24 0707 04/02/24 0945  GLUCAP 81 113*   Lipid Profile: No results for input(s): CHOL, HDL, LDLCALC, TRIG, CHOLHDL, LDLDIRECT  in the last 72 hours. Thyroid  Function Tests: No results for input(s): TSH, T4TOTAL, FREET4, T3FREE, THYROIDAB in the last 72 hours. Anemia Panel: No results for input(s): VITAMINB12, FOLATE, FERRITIN, TIBC, IRON, RETICCTPCT in the last 72 hours. Urine analysis:    Component Value Date/Time   COLORURINE YELLOW 04/03/2024 1639   APPEARANCEUR HAZY (A) 04/03/2024 1639   LABSPEC 1.008 04/03/2024 1639   PHURINE 8.0 04/03/2024 1639   GLUCOSEU NEGATIVE 04/03/2024 1639   HGBUR NEGATIVE 04/03/2024 1639   BILIRUBINUR NEGATIVE 04/03/2024 1639   BILIRUBINUR NEG 01/14/2014 1001   KETONESUR NEGATIVE 04/03/2024 1639   PROTEINUR NEGATIVE 04/03/2024 1639   UROBILINOGEN negative 01/14/2014 1001   NITRITE  NEGATIVE 04/03/2024 1639   LEUKOCYTESUR NEGATIVE 04/03/2024 1639    Radiological Exams on Admission: I have personally reviewed images CT ABDOMEN PELVIS W CONTRAST Result Date: 04/03/2024 CLINICAL DATA:  Postop abdominal pain.  Cholecystectomy 03/14/2024. EXAM: CT ABDOMEN AND PELVIS WITH CONTRAST TECHNIQUE: Multidetector CT imaging of the abdomen and pelvis was performed using the standard protocol following bolus administration of intravenous contrast. RADIATION DOSE REDUCTION: This exam was performed according to the departmental dose-optimization program which includes automated exposure control, adjustment of the mA and/or kV according to patient size and/or use of iterative reconstruction technique. CONTRAST:  75mL OMNIPAQUE  IOHEXOL  350 MG/ML SOLN COMPARISON:  Abdominopelvic CT 03/10/2024. FINDINGS: Lower chest: Mild linear atelectasis or scarring at both lung bases. There is no confluent airspace disease or significant pleural effusion. Aortic and coronary artery atherosclerosis noted. Hepatobiliary: The liver is normal in density without suspicious focal abnormality. Interval cholecystectomy and plastic biliary stent placement. The stent appears well positioned. There is a small amount of pneumobilia and no significant biliary dilatation. There is an ill-defined collection of gas and fluid within the cholecystectomy bed, measuring approximately 6.4 x 3.5 cm on image 34/5. Pancreas: Unremarkable. No pancreatic ductal dilatation or surrounding inflammatory changes. Spleen: Normal in size without focal abnormality. Adrenals/Urinary Tract: Both adrenal glands appear normal. No evidence of urinary tract calculus, suspicious renal lesion or hydronephrosis. Stable small renal cysts bilaterally for which no specific follow-up imaging is recommended. Stable mild bladder wall thickening without surrounding inflammation. Stomach/Bowel: No enteric contrast administered. The stomach appears unremarkable for its  degree of distension. No evidence of bowel wall thickening, distention or surrounding inflammatory change. Prior appendectomy. Moderate stool throughout the colon. Vascular/Lymphatic: There are no enlarged abdominal or pelvic lymph nodes. Aortic and branch vessel atherosclerosis without evidence of aneurysm or large vessel occlusion. The portal, superior mesenteric and splenic veins are patent. Reproductive: Stable mild enlargement of the prostate gland. Other: Stable fat containing left inguinal hernia. Postsurgical changes at the umbilicus. A surgical drain is present within the subhepatic space and traverses the previously-described air-fluid collection in the cholecystectomy bed. No other intra-abdominal fluid collections are identified. No ascites or pneumoperitoneum. Musculoskeletal: No acute or significant osseous findings. Multilevel thoracolumbar scoliosis. Partially ankylosing osteophytes of the sacroiliac joints bilaterally. IMPRESSION: 1. Interval cholecystectomy and plastic biliary stent placement. There is an ill-defined collection of gas and fluid within the cholecystectomy bed which could reflect a postoperative seroma, biloma or abscess. A surgical drain traverses this collection. 2. No other acute findings or explanation for the patient's symptoms. 3. Stable mild bladder wall thickening and prostatomegaly. 4.  Aortic Atherosclerosis (ICD10-I70.0). Electronically Signed   By: Elsie Perone M.D.   On: 04/03/2024 17:41   DG ERCP Result Date: 04/02/2024 CLINICAL DATA:  [id number] Surgery, elective [id number] ERCP, WITH INTERVENTION IF INDICATED INSERTION,  STENT, PANCREATIC DUCT STENT REMOVAL INSERTION, STENT EXAM: ERCP COMPARISON:  CT AP, 03/10/2024.  US  Abdomen, 03/10/2024. FLUOROSCOPY: Exposure Index (as provided by the fluoroscopic device): 103.5 mGy Kerma FINDINGS: Limited single planar images of the RIGHT upper quadrant obtained C-arm. Image demonstrating flexible endoscopy, biliary duct cannulation,  and retrograde cholangiogram. No biliary ductal dilation. No evidence of biliary filling defect is demonstrated. IMPRESSION: Fluoroscopic imaging for ERCP. For complete description of intra procedural findings, please see performing service dictation. Electronically Signed   By: Thom Hall M.D.   On: 04/02/2024 10:31   DG C-Arm 1-60 Min-No Report Result Date: 04/02/2024 Fluoroscopy was utilized by the requesting physician.  No radiographic interpretation.     EKG: My personal interpretation of EKG shows: Normal sinus rhythm heart rate 89.    Assessment/Plan: Principal Problem:   Intra-abdominal infection Active Problems:   History of cholecystectomy-status post biliary stent and JP drain in place   Non-insulin  dependent type 2 diabetes mellitus (HCC)   Hyperlipidemia   Essential hypertension   Cryptogenic stroke (HCC)   Hypomagnesemia   Generalized anxiety disorder   History of hyperlipidemia   History of insomnia   Insomnia    Assessment and Plan: Intra-abdominal infection-postsurgical seroma/abscess History of gangrenous cholecystitis s/p cholecystectomy 9/7 with JP drain and ERCP with CBD stent on 9/26 Abdominal pain associated nausea and vomiting -Presented to emergency department complaining of right-sided abdominal pain associate nausea vomiting and wife noticed that JP drain has been draining foul smelling discharge concern for infection.  At presentation to ED patient is hemodynamically stable.  Afebrile.  CBC showed leukocytosis 15.5, low hemoglobin 9.9 (baseline hemoglobin around 11-13). -CT abdomen pelvis showing Interval cholecystectomy and plastic biliary stent placement. There is an ill-defined collection of gas and fluid within the cholecystectomy bed which could reflect a postoperative seroma, biloma or abscess. A surgical drain traverses this collection. -CT abdomen pelvis with verified that there is no no dislodgment of the biliary stent. - ED physician spoke  with [facility] GI plan to see tomorrow and recommended general surgery consult.  Dr. Teresa has been consulted pending recommendation. -Obtaining blood culture. - Plan to continue IV Zosyn  for concern for intra-abdominal infection. - Will follow-up with general surgery and gastroenterology recommendation. -Continue pain control and nausea control with Dilaudid  and Zofran  as needed.   Acute on chronic anemia History of microcytic anemia - Low hemoglobin 9.9.  Baseline hemoglobin 12-13.  Patient denies any history of GI bleed or hematemesis, hemoptysis.  Obtaining FOBT. -Currently patient is on aspirin , Plavix  also on pharmacological DVT prophylaxis Lovenox .  If FOBT comes back positive need to hold antiplatelet and anticoagulation.  Non-insulin -dependent DM type II -Holding metformin  in acute setting.  Continue sliding scale insulin  with mealtime coverage.  History of cryptogenic stroke -Patient has intra-abdominal procedure 9/26.  Plan to start aspirin  and Plavix  from tomorrow morning 9/28.  Hypomagnesia -Lobac 1.5 in the setting of vomiting.  Repleted with IV mag 2 g  Hyperlipidemia -Continue Zetia  and Crestor   Generalized anxiety disorder -Continue Zoloft   Insomnia Continue trazodone  at bedtime    DVT prophylaxis:  Lovenox  Code Status:  Full Code Diet: Heart healthy carb modified Family Communication:   Family was present at bedside, at the time of interview. Opportunity was given to ask question and all questions were answered satisfactorily.  Disposition Plan: Will follow-up with general surgery and GI recommendation.  Need to follow-up with blood culture result for appropriate antibiotic guidance. Consults: General surgery and gastroenterology Admission status:  Inpatient, Telemetry bed  Severity of Illness: The appropriate patient status for this patient is INPATIENT. Inpatient status is judged to be reasonable and necessary in order to provide the required intensity of  service to ensure the patient's safety. The patient's presenting symptoms, physical exam findings, and initial radiographic and laboratory data in the context of their chronic comorbidities is felt to place them at high risk for further clinical deterioration. Furthermore, it is not anticipated that the patient will be medically stable for discharge from the hospital within 2 midnights of admission.   * I certify that at the point of admission it is my clinical judgment that the patient will require inpatient hospital care spanning beyond 2 midnights from the point of admission due to high intensity of service, high risk for further deterioration and high frequency of surveillance required.DEWAINE    Zunaira Lamy, MD [facility]  How to contact the [facility] Attending or Consulting provider 7A - 7P or covering provider during after hours 7P -7A, for this patient.  Check the care team in [facility] and look for a) attending/consulting [facility] provider listed and b) the [facility] team listed Log into [email or web address] and use [facility]'s universal password to access. If you do not have the password, please contact the hospital operator. Locate the [facility] provider you are looking for under [facility] and page to a number that you can be directly reached. If you still have difficulty reaching the provider, please page the [facility] (Director on Call) for the Hospitalists listed on amion for assistance.  04/03/2024, 8:50 PM

## 2024-04-03 NOTE — Consult Note (Signed)
 CC/reason for consult: Upper abdominal pain; recent subtotal cholecystectomy 9/7; ERCP yesterday (9/26)  Requesting physician: Bernardino Fireman, MD  HPI: Jerry [name] is an 68 y.o. male with hx of HTN, HLD, DM, CAD, underwent laparoscopic subtotal cholecystectomy 03/14/24 with Dr. Dasie. Noted severe acute cholecystitis with gangrene. Cystic duct could not be identified due to extensive inflammation, thus a subtotal fenestrating cholecystectomy was performed. A 19-Fr JP drain was left by the remnant gallbladder. Cholangiogram was not able to be performed due to cystic duct occlusion.   Developed postop bile leak and ultimately taken for ERCP 04/02/24 with Dr. Rosalie. Temporary plastic pancreatic stent placed. Sphincterotomy performed. Biliary tree swept with sludge. Plastic stent placed into CBD as well. Pancreatic stent fell out and was then removed.  Developed upper abdominal pain, nausea, vomiting x3 earlier today and presented to ER for evaluation.   Underwent workup and we are asked to see. GI was consulted as well by ER provider.  Past Medical History:  Diagnosis Date   Arthritis    Diabetes mellitus 2008   type II   Dyslipidemia    Erectile dysfunction    History of cardiac catheterization 06/2013   Dr. Peter Swaziland   HTN (hypertension)    Hypogonadism male     Past Surgical History:  Procedure Laterality Date   APPENDECTOMY     BUBBLE STUDY  11/02/2021   Procedure: BUBBLE STUDY;  Surgeon: Alvan Ronal BRAVO, MD;  Location: [facility] ENDOSCOPY;  Service: Cardiovascular;;   CHOLECYSTECTOMY N/A 03/14/2024   Procedure: LAPAROSCOPIC CHOLECYSTECTOMY;  Surgeon: Dasie Leonor CROME, MD;  Location: [facility] OR;  Service: General;  Laterality: N/A;   COLONOSCOPY  08/2006   Dr. Aneita - repeat 2018   INCISION AND DRAINAGE PERIRECTAL ABSCESS     INDOCYANINE GREEN  FLUORESCENCE IMAGING (ICG) N/A 03/14/2024   Procedure: INDOCYANINE GREEN  FLUORESCENCE IMAGING (ICG);  Surgeon: Dasie Leonor CROME, MD;  Location: [facility] OR;   Service: General;  Laterality: N/A;   KNEE ARTHROSCOPY Left 12/28/2014   Procedure: ARTHROSCOPY LEFT KNEE WITH  MEDIAL MENSICUS DEBRIDEMENT;  Surgeon: Dempsey Moan, MD;  Location: [facility] ORS;  Service: Orthopedics;  Laterality: Left;   KNEE SURGERY     left knee - remote past   LEFT HEART CATHETERIZATION WITH CORONARY ANGIOGRAM N/A 06/11/2013   Procedure: LEFT HEART CATHETERIZATION WITH CORONARY ANGIOGRAM;  Surgeon: Peter M Swaziland, MD;  Location: [facility] CATH LAB;  Service: Cardiovascular;  Laterality: N/A;   LOOP RECORDER INSERTION N/A 10/22/2021   Procedure: LOOP RECORDER INSERTION;  Surgeon: Fernande Elspeth BROCKS, MD;  Location: [facility] INVASIVE CV LAB;  Service: Cardiovascular;  Laterality: N/A;   right arm and elbow surgery     TEE WITHOUT CARDIOVERSION N/A 11/02/2021   Procedure: TRANSESOPHAGEAL ECHOCARDIOGRAM (TEE);  Surgeon: Alvan Ronal BRAVO, MD;  Location: [facility] ENDOSCOPY;  Service: Cardiovascular;  Laterality: N/A;   TONSILLECTOMY     TOTAL KNEE ARTHROPLASTY Left 10/04/2015   Procedure: LEFT TOTAL KNEE ARTHROPLASTY right knee aspiration and injection;  Surgeon: Dempsey Moan, MD;  Location: [facility] ORS;  Service: Orthopedics;  Laterality: Left;   TOTAL KNEE ARTHROPLASTY Right 11/04/2016   Procedure: RIGHT TOTAL KNEE ARTHROPLASTY;  Surgeon: Dempsey Moan, MD;  Location: [facility] ORS;  Service: Orthopedics;  Laterality: Right;  with abductor block   wisdom teeth extractions      Family History  Problem Relation Age of Onset   Diabetes Mother    Other Father        death by MVA   Other Brother  died of MVA   Stroke Neg Hx    Heart disease Neg Hx    Cancer Neg Hx    Hypertension Neg Hx     Social:  reports that he has quit smoking. His smoking use included cigarettes. He has never used smokeless tobacco. He reports that he does not drink alcohol and does not use drugs.  Allergies:  Allergies  Allergen Reactions   Diclofenac  Sodium Other (See Comments)    Gel caused a burning sensation    Lisinopril  Cough    Losartan  Cough    Unknown bad side effects     Medications: I have reviewed the patient's current medications.  Results for orders placed or performed during the hospital encounter of 04/03/24 (from the past 48 hours)  CBC with Diff     Status: Abnormal   Collection Time: 04/03/24  2:50 PM  Result Value Ref Range   WBC 15.5 (H) 4.0 - 10.5 K/uL   RBC 2.82 (L) 4.22 - 5.81 MIL/uL   Hemoglobin 9.9 (L) 13.0 - 17.0 g/dL   HCT 72.2 (L) 60.9 - 47.9 %   MCV 98.2 80.0 - 100.0 fL   MCH 35.1 (H) 26.0 - 34.0 pg   MCHC 35.7 30.0 - 36.0 g/dL   RDW 88.1 88.4 - 84.4 %   Platelets 325 150 - 400 K/uL   nRBC 0.0 0.0 - 0.2 %   Neutrophils Relative % 80 %   Neutro Abs 12.4 (H) 1.7 - 7.7 K/uL   Lymphocytes Relative 10 %   Lymphs Abs 1.6 0.7 - 4.0 K/uL   Monocytes Relative 9 %   Monocytes Absolute 1.4 (H) 0.1 - 1.0 K/uL   Eosinophils Relative 0 %   Eosinophils Absolute 0.0 0.0 - 0.5 K/uL   Basophils Relative 0 %   Basophils Absolute 0.0 0.0 - 0.1 K/uL   Immature Granulocytes 1 %   Abs Immature Granulocytes 0.07 0.00 - 0.07 K/uL    Comment: Performed at [facility] Lab, [address], [place], [place] [address]  I-stat chem 8, ED     Status: Abnormal   Collection Time: 04/03/24  3:23 PM  Result Value Ref Range   Sodium 131 (L) 135 - 145 mmol/L   Potassium 6.3 (HH) 3.5 - 5.1 mmol/L   Chloride 98 98 - 111 mmol/L   BUN 8 8 - 23 mg/dL   Creatinine, Ser 9.39 (L) 0.61 - 1.24 mg/dL   Glucose, Bld 867 (H) 70 - 99 mg/dL    Comment: Glucose reference range applies only to samples taken after fasting for at least 8 hours.   Calcium , Ion 1.07 (L) 1.15 - 1.40 mmol/L   TCO2 28 22 - 32 mmol/L   Hemoglobin 13.6 13.0 - 17.0 g/dL   HCT 59.9 60.9 - 47.9 %   Comment NOTIFIED PHYSICIAN   I-Stat Lactic Acid, ED     Status: None   Collection Time: 04/03/24  3:27 PM  Result Value Ref Range   Lactic Acid, Venous 1.7 0.5 - 1.9 mmol/L  Type and screen [facility]     Status: None    Collection Time: 04/03/24  3:28 PM  Result Value Ref Range   ABO/RH(D) A POS    Antibody Screen NEG    Sample Expiration      04/06/2024,2359 Performed at [facility] Lab, [address], [place], [place] [address]   Urinalysis, Routine w reflex microscopic -Urine, Clean Catch     Status:  Abnormal   Collection Time: 04/03/24  4:39 PM  Result Value Ref Range   Color, Urine YELLOW YELLOW   APPearance HAZY (A) CLEAR   Specific Gravity, Urine 1.008 1.005 - 1.030   pH 8.0 5.0 - 8.0   Glucose, UA NEGATIVE NEGATIVE mg/dL   Hgb urine dipstick NEGATIVE NEGATIVE   Bilirubin Urine NEGATIVE NEGATIVE   Ketones, ur NEGATIVE NEGATIVE mg/dL   Protein, ur NEGATIVE NEGATIVE mg/dL   Nitrite NEGATIVE NEGATIVE   Leukocytes,Ua NEGATIVE NEGATIVE    Comment: Performed at [facility] Lab, [address] [place], [place] [address]  Comprehensive metabolic panel with GFR     Status: Abnormal   Collection Time: 04/03/24  4:50 PM  Result Value Ref Range   Sodium 135 135 - 145 mmol/L   Potassium 3.6 3.5 - 5.1 mmol/L    Comment: HEMOLYSIS AT THIS LEVEL MAY AFFECT RESULT   Chloride 109 98 - 111 mmol/L   CO2 19 (L) 22 - 32 mmol/L   Glucose, Bld 76 70 - 99 mg/dL    Comment: Glucose reference range applies only to samples taken after fasting for at least 8 hours.   BUN 6 (L) 8 - 23 mg/dL   Creatinine, Ser 9.50 (L) 0.61 - 1.24 mg/dL   Calcium  7.3 (L) 8.9 - 10.3 mg/dL   Total Protein 5.2 (L) 6.5 - 8.1 g/dL   Albumin 2.3 (L) 3.5 - 5.0 g/dL   AST 20 15 - 41 U/L    Comment: HEMOLYSIS AT THIS LEVEL MAY AFFECT RESULT   ALT 11 0 - 44 U/L    Comment: HEMOLYSIS AT THIS LEVEL MAY AFFECT RESULT   Alkaline Phosphatase 44 38 - 126 U/L   Total Bilirubin 1.4 (H) 0.0 - 1.2 mg/dL    Comment: HEMOLYSIS AT THIS LEVEL MAY AFFECT RESULT   GFR, Estimated >60 >60 mL/min    Comment: (NOTE) Calculated using the CKD-EPI Creatinine Equation (2021)    Anion gap 7 5 - 15    Comment: Performed at [facility] Lab,  [address] [place], [place] [address]  Magnesium      Status: Abnormal   Collection Time: 04/03/24  4:50 PM  Result Value Ref Range   Magnesium  1.5 (L) 1.7 - 2.4 mg/dL    Comment: Performed at [facility] Lab, [address] [place], [place] [address]    CT ABDOMEN PELVIS W CONTRAST Result Date: 04/03/2024 CLINICAL DATA:  Postop abdominal pain.  Cholecystectomy 03/14/2024. EXAM: CT ABDOMEN AND PELVIS WITH CONTRAST TECHNIQUE: Multidetector CT imaging of the abdomen and pelvis was performed using the standard protocol following bolus administration of intravenous contrast. RADIATION DOSE REDUCTION: This exam was performed according to the departmental dose-optimization program which includes automated exposure control, adjustment of the mA and/or kV according to patient size and/or use of iterative reconstruction technique. CONTRAST:  75mL OMNIPAQUE  IOHEXOL  350 MG/ML SOLN COMPARISON:  Abdominopelvic CT 03/10/2024. FINDINGS: Lower chest: Mild linear atelectasis or scarring at both lung bases. There is no confluent airspace disease or significant pleural effusion. Aortic and coronary artery atherosclerosis noted. Hepatobiliary: The liver is normal in density without suspicious focal abnormality. Interval cholecystectomy and plastic biliary stent placement. The stent appears well positioned. There is a small amount of pneumobilia and no significant biliary dilatation. There is an ill-defined collection of gas and fluid within the cholecystectomy bed, measuring approximately 6.4 x 3.5 cm on image 34/5. Pancreas: Unremarkable. No pancreatic ductal dilatation or surrounding inflammatory changes. Spleen: Normal  in size without focal abnormality. Adrenals/Urinary Tract: Both adrenal glands appear normal. No evidence of urinary tract calculus, suspicious renal lesion or hydronephrosis. Stable small renal cysts bilaterally for which no specific follow-up imaging is recommended. Stable mild bladder wall thickening  without surrounding inflammation. Stomach/Bowel: No enteric contrast administered. The stomach appears unremarkable for its degree of distension. No evidence of bowel wall thickening, distention or surrounding inflammatory change. Prior appendectomy. Moderate stool throughout the colon. Vascular/Lymphatic: There are no enlarged abdominal or pelvic lymph nodes. Aortic and branch vessel atherosclerosis without evidence of aneurysm or large vessel occlusion. The portal, superior mesenteric and splenic veins are patent. Reproductive: Stable mild enlargement of the prostate gland. Other: Stable fat containing left inguinal hernia. Postsurgical changes at the umbilicus. A surgical drain is present within the subhepatic space and traverses the previously-described air-fluid collection in the cholecystectomy bed. No other intra-abdominal fluid collections are identified. No ascites or pneumoperitoneum. Musculoskeletal: No acute or significant osseous findings. Multilevel thoracolumbar scoliosis. Partially ankylosing osteophytes of the sacroiliac joints bilaterally. IMPRESSION: 1. Interval cholecystectomy and plastic biliary stent placement. There is an ill-defined collection of gas and fluid within the cholecystectomy bed which could reflect a postoperative seroma, biloma or abscess. A surgical drain traverses this collection. 2. No other acute findings or explanation for the patient's symptoms. 3. Stable mild bladder wall thickening and prostatomegaly. 4.  Aortic Atherosclerosis (ICD10-I70.0). Electronically Signed   By: Elsie Perone M.D.   On: 04/03/2024 17:41   DG ERCP Result Date: 04/02/2024 CLINICAL DATA:  [id number] Surgery, elective [id number] ERCP, WITH INTERVENTION IF INDICATED INSERTION, STENT, PANCREATIC DUCT STENT REMOVAL INSERTION, STENT EXAM: ERCP COMPARISON:  CT AP, 03/10/2024.  US  Abdomen, 03/10/2024. FLUOROSCOPY: Exposure Index (as provided by the fluoroscopic device): 103.5 mGy Kerma FINDINGS: Limited single  planar images of the RIGHT upper quadrant obtained C-arm. Image demonstrating flexible endoscopy, biliary duct cannulation, and retrograde cholangiogram. No biliary ductal dilation. No evidence of biliary filling defect is demonstrated. IMPRESSION: Fluoroscopic imaging for ERCP. For complete description of intra procedural findings, please see performing service dictation. Electronically Signed   By: Thom Hall M.D.   On: 04/02/2024 10:31   DG C-Arm 1-60 Min-No Report Result Date: 04/02/2024 Fluoroscopy was utilized by the requesting physician.  No radiographic interpretation.    ROS - all of the below systems have been reviewed with the patient and positives are indicated with bold text General: chills, fever or night sweats Eyes: blurry vision or double vision ENT: epistaxis or sore throat Allergy/Immunology: itchy/watery eyes or nasal congestion Hematologic/Lymphatic: bleeding problems, blood clots or swollen lymph nodes Endocrine: temperature intolerance or unexpected weight changes Breast: new or changing breast lumps or nipple discharge Resp: cough, shortness of breath, or wheezing CV: chest pain or dyspnea on exertion GI: as per HPI GU: dysuria, trouble voiding, or hematuria MSK: joint pain or joint stiffness Neuro: TIA or stroke symptoms Derm: pruritus and skin lesion changes Psych: anxiety and depression  PE Blood pressure (!) 156/99, pulse 90, temperature 98.4 F (36.9 C), temperature source Oral, resp. rate 17, SpO2 100%. Constitutional: NAD; conversant Eyes: Moist conjunctiva; anicteric Lungs: Normal respiratory effort CV: RRR GI: Abd soft, not significantly tender nor distended; JP drain in place with clear golden bile.  All incisions are well-healed without erythema or drainage. Psychiatric: Appropriate affect Results for orders placed or performed during the hospital encounter of 04/03/24 (from the past 48 hours)  CBC with Diff     Status: Abnormal   Collection  Time: 04/03/24  2:50 PM  Result Value Ref Range   WBC 15.5 (H) 4.0 - 10.5 K/uL   RBC 2.82 (L) 4.22 - 5.81 MIL/uL   Hemoglobin 9.9 (L) 13.0 - 17.0 g/dL   HCT 72.2 (L) 60.9 - 47.9 %   MCV 98.2 80.0 - 100.0 fL   MCH 35.1 (H) 26.0 - 34.0 pg   MCHC 35.7 30.0 - 36.0 g/dL   RDW 88.1 88.4 - 84.4 %   Platelets 325 150 - 400 K/uL   nRBC 0.0 0.0 - 0.2 %   Neutrophils Relative % 80 %   Neutro Abs 12.4 (H) 1.7 - 7.7 K/uL   Lymphocytes Relative 10 %   Lymphs Abs 1.6 0.7 - 4.0 K/uL   Monocytes Relative 9 %   Monocytes Absolute 1.4 (H) 0.1 - 1.0 K/uL   Eosinophils Relative 0 %   Eosinophils Absolute 0.0 0.0 - 0.5 K/uL   Basophils Relative 0 %   Basophils Absolute 0.0 0.0 - 0.1 K/uL   Immature Granulocytes 1 %   Abs Immature Granulocytes 0.07 0.00 - 0.07 K/uL    Comment: Performed at [facility] Lab, [address] [place], [place] [address]  I-stat chem 8, ED     Status: Abnormal   Collection Time: 04/03/24  3:23 PM  Result Value Ref Range   Sodium 131 (L) 135 - 145 mmol/L   Potassium 6.3 (HH) 3.5 - 5.1 mmol/L   Chloride 98 98 - 111 mmol/L   BUN 8 8 - 23 mg/dL   Creatinine, Ser 9.39 (L) 0.61 - 1.24 mg/dL   Glucose, Bld 867 (H) 70 - 99 mg/dL    Comment: Glucose reference range applies only to samples taken after fasting for at least 8 hours.   Calcium , Ion 1.07 (L) 1.15 - 1.40 mmol/L   TCO2 28 22 - 32 mmol/L   Hemoglobin 13.6 13.0 - 17.0 g/dL   HCT 59.9 60.9 - 47.9 %   Comment NOTIFIED PHYSICIAN   I-Stat Lactic Acid, ED     Status: None   Collection Time: 04/03/24  3:27 PM  Result Value Ref Range   Lactic Acid, Venous 1.7 0.5 - 1.9 mmol/L  Type and screen [facility]     Status: None   Collection Time: 04/03/24  3:28 PM  Result Value Ref Range   ABO/RH(D) A POS    Antibody Screen NEG    Sample Expiration      04/06/2024,2359 Performed at [facility] Lab, [address], [place], [place] [address]   Urinalysis, Routine w reflex microscopic -Urine, Clean  Catch     Status: Abnormal   Collection Time: 04/03/24  4:39 PM  Result Value Ref Range   Color, Urine YELLOW YELLOW   APPearance HAZY (A) CLEAR   Specific Gravity, Urine 1.008 1.005 - 1.030   pH 8.0 5.0 - 8.0   Glucose, UA NEGATIVE NEGATIVE mg/dL   Hgb urine dipstick NEGATIVE NEGATIVE   Bilirubin Urine NEGATIVE NEGATIVE   Ketones, ur NEGATIVE NEGATIVE mg/dL   Protein, ur NEGATIVE NEGATIVE mg/dL   Nitrite NEGATIVE NEGATIVE   Leukocytes,Ua NEGATIVE NEGATIVE    Comment: Performed at [facility] Lab, [address] [place], [place] [address]  Comprehensive metabolic panel with GFR     Status: Abnormal   Collection Time: 04/03/24  4:50 PM  Result Value Ref Range   Sodium 135 135 - 145 mmol/L   Potassium 3.6 3.5 - 5.1 mmol/L    Comment:  HEMOLYSIS AT THIS LEVEL MAY AFFECT RESULT   Chloride 109 98 - 111 mmol/L   CO2 19 (L) 22 - 32 mmol/L   Glucose, Bld 76 70 - 99 mg/dL    Comment: Glucose reference range applies only to samples taken after fasting for at least 8 hours.   BUN 6 (L) 8 - 23 mg/dL   Creatinine, Ser 9.50 (L) 0.61 - 1.24 mg/dL   Calcium  7.3 (L) 8.9 - 10.3 mg/dL   Total Protein 5.2 (L) 6.5 - 8.1 g/dL   Albumin 2.3 (L) 3.5 - 5.0 g/dL   AST 20 15 - 41 U/L    Comment: HEMOLYSIS AT THIS LEVEL MAY AFFECT RESULT   ALT 11 0 - 44 U/L    Comment: HEMOLYSIS AT THIS LEVEL MAY AFFECT RESULT   Alkaline Phosphatase 44 38 - 126 U/L   Total Bilirubin 1.4 (H) 0.0 - 1.2 mg/dL    Comment: HEMOLYSIS AT THIS LEVEL MAY AFFECT RESULT   GFR, Estimated >60 >60 mL/min    Comment: (NOTE) Calculated using the CKD-EPI Creatinine Equation (2021)    Anion gap 7 5 - 15    Comment: Performed at [facility] Lab, [address] [place], [place] [address]  Magnesium      Status: Abnormal   Collection Time: 04/03/24  4:50 PM  Result Value Ref Range   Magnesium  1.5 (L) 1.7 - 2.4 mg/dL    Comment: Performed at [facility] Lab, [address] [place], [place] [address]    CT ABDOMEN PELVIS W  CONTRAST Result Date: 04/03/2024 CLINICAL DATA:  Postop abdominal pain.  Cholecystectomy 03/14/2024. EXAM: CT ABDOMEN AND PELVIS WITH CONTRAST TECHNIQUE: Multidetector CT imaging of the abdomen and pelvis was performed using the standard protocol following bolus administration of intravenous contrast. RADIATION DOSE REDUCTION: This exam was performed according to the departmental dose-optimization program which includes automated exposure control, adjustment of the mA and/or kV according to patient size and/or use of iterative reconstruction technique. CONTRAST:  75mL OMNIPAQUE  IOHEXOL  350 MG/ML SOLN COMPARISON:  Abdominopelvic CT 03/10/2024. FINDINGS: Lower chest: Mild linear atelectasis or scarring at both lung bases. There is no confluent airspace disease or significant pleural effusion. Aortic and coronary artery atherosclerosis noted. Hepatobiliary: The liver is normal in density without suspicious focal abnormality. Interval cholecystectomy and plastic biliary stent placement. The stent appears well positioned. There is a small amount of pneumobilia and no significant biliary dilatation. There is an ill-defined collection of gas and fluid within the cholecystectomy bed, measuring approximately 6.4 x 3.5 cm on image 34/5. Pancreas: Unremarkable. No pancreatic ductal dilatation or surrounding inflammatory changes. Spleen: Normal in size without focal abnormality. Adrenals/Urinary Tract: Both adrenal glands appear normal. No evidence of urinary tract calculus, suspicious renal lesion or hydronephrosis. Stable small renal cysts bilaterally for which no specific follow-up imaging is recommended. Stable mild bladder wall thickening without surrounding inflammation. Stomach/Bowel: No enteric contrast administered. The stomach appears unremarkable for its degree of distension. No evidence of bowel wall thickening, distention or surrounding inflammatory change. Prior appendectomy. Moderate stool throughout the colon.  Vascular/Lymphatic: There are no enlarged abdominal or pelvic lymph nodes. Aortic and branch vessel atherosclerosis without evidence of aneurysm or large vessel occlusion. The portal, superior mesenteric and splenic veins are patent. Reproductive: Stable mild enlargement of the prostate gland. Other: Stable fat containing left inguinal hernia. Postsurgical changes at the umbilicus. A surgical drain is present within the subhepatic space and traverses the previously-described air-fluid collection in the cholecystectomy bed.  No other intra-abdominal fluid collections are identified. No ascites or pneumoperitoneum. Musculoskeletal: No acute or significant osseous findings. Multilevel thoracolumbar scoliosis. Partially ankylosing osteophytes of the sacroiliac joints bilaterally. IMPRESSION: 1. Interval cholecystectomy and plastic biliary stent placement. There is an ill-defined collection of gas and fluid within the cholecystectomy bed which could reflect a postoperative seroma, biloma or abscess. A surgical drain traverses this collection. 2. No other acute findings or explanation for the patient's symptoms. 3. Stable mild bladder wall thickening and prostatomegaly. 4.  Aortic Atherosclerosis (ICD10-I70.0). Electronically Signed   By: Elsie Perone M.D.   On: 04/03/2024 17:41   DG ERCP Result Date: 04/02/2024 CLINICAL DATA:  [id number] Surgery, elective [id number] ERCP, WITH INTERVENTION IF INDICATED INSERTION, STENT, PANCREATIC DUCT STENT REMOVAL INSERTION, STENT EXAM: ERCP COMPARISON:  CT AP, 03/10/2024.  US  Abdomen, 03/10/2024. FLUOROSCOPY: Exposure Index (as provided by the fluoroscopic device): 103.5 mGy Kerma FINDINGS: Limited single planar images of the RIGHT upper quadrant obtained C-arm. Image demonstrating flexible endoscopy, biliary duct cannulation, and retrograde cholangiogram. No biliary ductal dilation. No evidence of biliary filling defect is demonstrated. IMPRESSION: Fluoroscopic imaging for ERCP. For  complete description of intra procedural findings, please see performing service dictation. Electronically Signed   By: Thom Hall M.D.   On: 04/02/2024 10:31   DG C-Arm 1-60 Min-No Report Result Date: 04/02/2024 Fluoroscopy was utilized by the requesting physician.  No radiographic interpretation.    A/P: JUSITN SALSGIVER is an 68 y.o. male with HTN, HLD, DM, CAD s/p subtotal cholecystectomy 03/13/24 with postop bile leak, ERCP 04/02/24, here with post-ERCP pancreatitis  -Agree with GI evaluation -Continue drain for time being - output has decreased significantly since CBD stent placed. Did have fluid collection in gallbladder remnant but the drain does traverse this and does appear to be adequately drained this given that the drain is still having output.  Expect that this will continue to taper off over the coming days now that the stent is in place.  -Please let us  know if questions or concerns arise in the interim.   Lonni Pizza, MD [facility], [facility]

## 2024-04-03 NOTE — ED Notes (Signed)
 MD at bedside...KM

## 2024-04-03 NOTE — ED Notes (Signed)
 Patient transported to CT

## 2024-04-04 DIAGNOSIS — B999 Unspecified infectious disease: Secondary | ICD-10-CM

## 2024-04-04 DIAGNOSIS — Z8673 Personal history of transient ischemic attack (TIA), and cerebral infarction without residual deficits: Secondary | ICD-10-CM

## 2024-04-04 DIAGNOSIS — E119 Type 2 diabetes mellitus without complications: Secondary | ICD-10-CM

## 2024-04-04 DIAGNOSIS — I10 Essential (primary) hypertension: Secondary | ICD-10-CM | POA: Diagnosis not present

## 2024-04-04 DIAGNOSIS — K9189 Other postprocedural complications and disorders of digestive system: Secondary | ICD-10-CM | POA: Diagnosis not present

## 2024-04-04 DIAGNOSIS — Z9049 Acquired absence of other specified parts of digestive tract: Secondary | ICD-10-CM | POA: Diagnosis not present

## 2024-04-04 DIAGNOSIS — D649 Anemia, unspecified: Secondary | ICD-10-CM

## 2024-04-04 DIAGNOSIS — E785 Hyperlipidemia, unspecified: Secondary | ICD-10-CM

## 2024-04-04 DIAGNOSIS — K859 Acute pancreatitis without necrosis or infection, unspecified: Secondary | ICD-10-CM

## 2024-04-04 DIAGNOSIS — F411 Generalized anxiety disorder: Secondary | ICD-10-CM

## 2024-04-04 DIAGNOSIS — G4700 Insomnia, unspecified: Secondary | ICD-10-CM

## 2024-04-04 LAB — COMPREHENSIVE METABOLIC PANEL WITH GFR
ALT: 17 U/L (ref 0–44)
AST: 18 U/L (ref 15–41)
Albumin: 2.8 g/dL — ABNORMAL LOW (ref 3.5–5.0)
Alkaline Phosphatase: 56 U/L (ref 38–126)
Anion gap: 9 (ref 5–15)
BUN: 7 mg/dL — ABNORMAL LOW (ref 8–23)
CO2: 23 mmol/L (ref 22–32)
Calcium: 9 mg/dL (ref 8.9–10.3)
Chloride: 99 mmol/L (ref 98–111)
Creatinine, Ser: 0.72 mg/dL (ref 0.61–1.24)
GFR, Estimated: >60 mL/min (ref >60)
Glucose, Bld: 84 mg/dL (ref 70–99)
Potassium: 3.6 mmol/L (ref 3.5–5.1)
Sodium: 131 mmol/L — ABNORMAL LOW (ref 135–145)
Total Bilirubin: 1 mg/dL (ref 0.0–1.2)
Total Protein: 6.5 g/dL (ref 6.5–8.1)

## 2024-04-04 LAB — GLUCOSE, CAPILLARY
Glucose-Capillary: 88 mg/dL (ref 70–99)
Glucose-Capillary: 82 mg/dL (ref 70–99)
Glucose-Capillary: 86 mg/dL (ref 70–99)
Glucose-Capillary: 85 mg/dL (ref 70–99)
Glucose-Capillary: 76 mg/dL (ref 70–99)

## 2024-04-04 LAB — CBC
HCT: 35.5 % — ABNORMAL LOW (ref 39.0–52.0)
Hemoglobin: 12.6 g/dL — ABNORMAL LOW (ref 13.0–17.0)
MCH: 34.4 pg — ABNORMAL HIGH (ref 26.0–34.0)
MCHC: 35.5 g/dL (ref 30.0–36.0)
MCV: 97 fL (ref 80.0–100.0)
Platelets: 273 K/uL (ref 150–400)
RBC: 3.66 MIL/uL — ABNORMAL LOW (ref 4.22–5.81)
RDW: 11.8 % (ref 11.5–15.5)
WBC: 13.5 K/uL — ABNORMAL HIGH (ref 4.0–10.5)
nRBC: 0 % (ref 0.0–0.2)

## 2024-04-04 LAB — LIPASE, BLOOD: Lipase: 907 U/L — ABNORMAL HIGH (ref 11–51)

## 2024-04-04 MED ORDER — CALCIUM POLYCARBOPHIL 625 MG PO TABS
625.0000 mg | ORAL_TABLET | Freq: Two times a day (BID) | ORAL | Status: DC
  Administered 2024-04-04 – 2024-04-05 (×3): 625 mg via ORAL
  Filled 2024-04-04 (×4): qty 1

## 2024-04-04 MED ORDER — MAGIC MOUTHWASH: 15.0000 mL | Freq: Four times a day (QID) | ORAL | Status: DC | PRN

## 2024-04-04 MED ORDER — SODIUM CHLORIDE 0.9 % IV SOLN: INTRAVENOUS | Status: AC

## 2024-04-04 MED ORDER — ALUM & MAG HYDROXIDE-SIMETH 200-200-20 MG/5ML PO SUSP: 30.0000 mL | Freq: Four times a day (QID) | ORAL | Status: DC | PRN

## 2024-04-04 MED ORDER — PIPERACILLIN-TAZOBACTAM 3.375 G IVPB
3.3750 g | Freq: Three times a day (TID) | INTRAVENOUS | Status: DC
  Administered 2024-04-04 – 2024-04-05 (×4): 3.375 g via INTRAVENOUS
  Filled 2024-04-04 (×4): qty 50

## 2024-04-04 MED ORDER — LACTATED RINGERS IV BOLUS: 1000.0000 mL | Freq: Three times a day (TID) | INTRAVENOUS | Status: DC | PRN

## 2024-04-04 MED ORDER — SALINE SPRAY 0.65 % NA SOLN
1.0000 | Freq: Four times a day (QID) | NASAL | Status: DC | PRN
Start: 2024-04-04 — End: 2024-04-05

## 2024-04-04 MED ORDER — NAPHAZOLINE-GLYCERIN 0.012-0.25 % OP SOLN: 1.0000 [drp] | Freq: Four times a day (QID) | OPHTHALMIC | Status: DC | PRN

## 2024-04-04 MED ORDER — PHENOL 1.4 % MT LIQD: 2.0000 | OROMUCOSAL | Status: DC | PRN

## 2024-04-04 MED ORDER — MAGNESIUM HYDROXIDE 400 MG/5ML PO SUSP
30.0000 mL | Freq: Every day | ORAL | Status: DC
Start: 2024-04-04 — End: 2024-04-05
  Administered 2024-04-04 – 2024-04-05 (×2): 30 mL via ORAL
  Filled 2024-04-04 (×2): qty 30

## 2024-04-04 MED ORDER — MENTHOL 3 MG MT LOZG: 1.0000 | LOZENGE | OROMUCOSAL | Status: DC | PRN

## 2024-04-04 MED ORDER — SIMETHICONE 40 MG/0.6ML PO SUSP: 80.0000 mg | Freq: Four times a day (QID) | ORAL | Status: DC | PRN

## 2024-04-04 MED ORDER — BISACODYL 10 MG RE SUPP: 10.0000 mg | Freq: Every day | RECTAL | Status: DC | PRN

## 2024-04-04 NOTE — Assessment & Plan Note (Signed)
 Continue Crestor

## 2024-04-04 NOTE — Hospital Course (Signed)
 Partly taken from H&P.  Jerry [name] is a 68 y.o. male with medical history significant of recent gangrenous cholecystitis underwent partial cholecystectomy 9/7 with JP drain followed by biliary leak required ERCP with common bile duct stent placement 9/26, hypertension, DM type II, cryptogenic stroke-on aspirin  and Plavix , hyperlipidemia, chronic hyponatremia, chronic macrocytosis, presented to emergency department complaining of abdominal pain, nausea vomiting and 1 episode of loose stool in last 24 hours.   Per patient and spouse at the bedside patient has purulent discharge into the drain since last night, quantity has decreased since the placement.  No fever or chills.  On presentation mildly elevated blood pressure otherwise afebrile and stable vital.  Labs with leukocytosis of 15.5, hemoglobin 9.9, bicarb 16, T. bili 1.5, magnesium  1.5, lactic acid normal at 1.7 CT abdomen pelvis showing: IMPRESSION: 1. Interval cholecystectomy and plastic biliary stent placement. There is an ill-defined collection of gas and fluid within the cholecystectomy bed which could reflect a postoperative seroma, biloma or abscess. A surgical drain traverses this collection. 2. No other acute findings or explanation for the patient's symptoms. 3. Stable mild bladder wall thickening and prostatomegaly. 4.  Aortic Atherosclerosis (ICD10-I70.0).  GI and surgery was consulted from ED.  Patient was started on Zosyn .  9/28: Vital stable, lipase significantly elevated at 1008, likely has developed post ERCP pancreatitis. Improving leukocytosis at 13.5, hemoglobin 12.6, mild hyponatremia with sodium at 131, bicarb improved to 23 and T. bili improved to 1.  Preliminary blood cultures negative in 12 hours.  General surgery is recommending supportive care.  9/29: Remained hemodynamically stable, significant improvement in pain and tolerated advancement in diet.  Surgery does not think that he need any further  antibiotics so it was discontinued.  Blood cultures remain negative.  Lipase with significant improvement to 100.  Patient is being discharged and he will continue his prior home medications.  He need to have a close follow-up with his surgeon and gastroenterologist for further assistance.

## 2024-04-04 NOTE — Assessment & Plan Note (Signed)
-   Continue home Zoloft

## 2024-04-04 NOTE — Assessment & Plan Note (Signed)
 Continue home trazodone 

## 2024-04-04 NOTE — Assessment & Plan Note (Signed)
-  Continue home Zetia and Crestor

## 2024-04-04 NOTE — Consult Note (Signed)
 Referring Provider: Dr. Melvenia Primary Care Physician:  Lazoff, Shawn P, DO Primary Gastroenterologist:  Dr. Rosalie  Reason for Consultation:  Pancreatitis  HPI: Jerry [name] is a 68 y.o. male who had gangrenous cholecystitis and is s/p laparoscopic subtotal fenestrating cholecystectomy on 9/7 with placement of JP drain and due to a bile leak an ERCP was performed on 9/26 with plastic biliary stent placed and sphincterotomy performed. Pancreatic duct stent placed but fell out during procedure and could not be replaced. Yesterday around 3 AM he had the acute onset of severe abdominal pain and nausea/vomiting. Wife reports purulent drainage from JP drain yesterday. Subjective fevers and chills. Complaining of pain at exit point of JP drain due to pulling of skin. Reports one loose stool prior to coming to hospital. Lipase 1008, TB 1, ALP 56, AST 18, ALT 17. 6.4 cm X 3.5 cm fluid collection in cholecystectomy bed. Wife and daughter in room. On Plavix .  Past Medical History:  Diagnosis Date   Arthritis    Diabetes mellitus 2008   type II   Dyslipidemia    Erectile dysfunction    History of cardiac catheterization 06/2013   Dr. Peter Swaziland   HTN (hypertension)    Hypogonadism male     Past Surgical History:  Procedure Laterality Date   APPENDECTOMY     BUBBLE STUDY  11/02/2021   Procedure: BUBBLE STUDY;  Surgeon: Alvan Ronal BRAVO, MD;  Location: [facility] ENDOSCOPY;  Service: Cardiovascular;;   CHOLECYSTECTOMY N/A 03/14/2024   Procedure: LAPAROSCOPIC CHOLECYSTECTOMY;  Surgeon: Dasie Leonor CROME, MD;  Location: [facility] OR;  Service: General;  Laterality: N/A;   COLONOSCOPY  08/2006   Dr. Aneita - repeat 2018   INCISION AND DRAINAGE PERIRECTAL ABSCESS     INDOCYANINE GREEN  FLUORESCENCE IMAGING (ICG) N/A 03/14/2024   Procedure: INDOCYANINE GREEN  FLUORESCENCE IMAGING (ICG);  Surgeon: Dasie Leonor CROME, MD;  Location: [facility] OR;  Service: General;  Laterality: N/A;   KNEE ARTHROSCOPY Left 12/28/2014   Procedure:  ARTHROSCOPY LEFT KNEE WITH  MEDIAL MENSICUS DEBRIDEMENT;  Surgeon: Dempsey Moan, MD;  Location: [facility] ORS;  Service: Orthopedics;  Laterality: Left;   KNEE SURGERY     left knee - remote past   LEFT HEART CATHETERIZATION WITH CORONARY ANGIOGRAM N/A 06/11/2013   Procedure: LEFT HEART CATHETERIZATION WITH CORONARY ANGIOGRAM;  Surgeon: Peter M Swaziland, MD;  Location: [facility] CATH LAB;  Service: Cardiovascular;  Laterality: N/A;   LOOP RECORDER INSERTION N/A 10/22/2021   Procedure: LOOP RECORDER INSERTION;  Surgeon: Fernande Elspeth BROCKS, MD;  Location: [facility] INVASIVE CV LAB;  Service: Cardiovascular;  Laterality: N/A;   right arm and elbow surgery     TEE WITHOUT CARDIOVERSION N/A 11/02/2021   Procedure: TRANSESOPHAGEAL ECHOCARDIOGRAM (TEE);  Surgeon: Alvan Ronal BRAVO, MD;  Location: [facility] ENDOSCOPY;  Service: Cardiovascular;  Laterality: N/A;   TONSILLECTOMY     TOTAL KNEE ARTHROPLASTY Left 10/04/2015   Procedure: LEFT TOTAL KNEE ARTHROPLASTY right knee aspiration and injection;  Surgeon: Dempsey Moan, MD;  Location: [facility] ORS;  Service: Orthopedics;  Laterality: Left;   TOTAL KNEE ARTHROPLASTY Right 11/04/2016   Procedure: RIGHT TOTAL KNEE ARTHROPLASTY;  Surgeon: Dempsey Moan, MD;  Location: [facility] ORS;  Service: Orthopedics;  Laterality: Right;  with abductor block   wisdom teeth extractions      Prior to Admission medications   Medication Sig Start Date End Date Taking? Authorizing Provider  acetaminophen  (TYLENOL ) 500 MG tablet Take 1,000 mg by mouth every 6 (six) hours as needed for mild pain.  Yes [provider]  albuterol (VENTOLIN HFA) 108 (90 Base) MCG/ACT inhaler SMARTSIG:2 Puff(s) By Mouth Every 4 Hours PRN 10/05/23  Yes [provider]  amLODipine  (NORVASC ) 10 MG tablet Take 10 mg by mouth daily.   Yes [provider]  aspirin  EC 81 MG tablet Take 1 tablet (81 mg total) by mouth daily. Swallow whole. 06/04/22  Yes BranchRonal BRAVO, MD  bisacodyl  (DULCOLAX) 10 MG suppository Place 1  suppository (10 mg total) rectally daily as needed for moderate constipation. 03/16/24  Yes Tobie Yetta HERO, MD  Calcium  Carbonate Antacid (TUMS PO) Take 2 tablets by mouth daily as needed (reflux).   Yes [provider]  clopidogrel  (PLAVIX ) 75 MG tablet Take 1 tablet (75 mg total) by mouth daily. 06/04/22  Yes BranchRonal BRAVO, MD  hydrOXYzine  (VISTARIL ) 25 MG capsule Take 1 capsule (25 mg total) by mouth 3 (three) times daily as needed for anxiety. 03/29/24  Yes Izella Ismael NOVAK, MD  ketorolac  (ACULAR ) 0.5 % ophthalmic solution Place 1 drop into the right eye as needed (every 3 months after eye injection).   Yes [provider]  magnesium  hydroxide (MILK OF MAGNESIA) 400 MG/5ML suspension Take 30 mLs by mouth daily as needed for mild constipation.   Yes [provider]  metFORMIN  (GLUCOPHAGE ) 1000 MG tablet Take 1 tablet (1,000 mg total) by mouth daily. Patient taking differently: Take 1,000 mg by mouth 2 (two) times daily with a meal. 12/28/14  Yes Aluisio, Dempsey, MD  MOUNJARO 15 MG/0.5ML Pen Inject 15 mg into the skin once a week. Mondays   Yes [provider]  oxyCODONE  (OXY IR/ROXICODONE ) 5 MG immediate release tablet Take 1 tablet (5 mg total) by mouth every 4 (four) hours as needed for moderate pain (pain score 4-6) or severe pain (pain score 7-10). 03/16/24  Yes Vicci Sor R, PA-C  rosuvastatin  (CRESTOR ) 40 MG tablet Take 1 tablet (40 mg total) by mouth daily. 03/16/24  Yes Tobie Yetta HERO, MD  sertraline  (ZOLOFT ) 100 MG tablet Take 1 tablet (100 mg total) by mouth in the morning and at bedtime. 03/29/24  Yes Izella Ismael NOVAK, MD  traZODone  (DESYREL ) 100 MG tablet Take 1 tablet (100 mg total) by mouth at bedtime. 03/29/24  Yes Izella Ismael NOVAK, MD  ezetimibe  (ZETIA ) 10 MG tablet Take 1 tablet (10 mg total) by mouth daily. Patient not taking: Reported on 04/03/2024 03/16/24   Tobie Yetta HERO, MD  Insulin  Glargine (LANTUS  SOLOSTAR) 100 UNIT/ML Solostar Pen Inject 15  Units into the skin 2 (two) times daily. Patient not taking: Reported on 03/11/2024 12/28/14   Melodi Dempsey, MD  Study - OCEANIC-STROKE - asundexian 50 mg or placebo tablet (PI-Sethi) Take 1 tablet (50 mg total) by mouth daily. For Investigational Use Only. Take at the same time each day (preferably in the morning). Tablet should be swallowed whole with water; it CANNOT be crushed or broken. Please contact [facility] if you have any questions regarding this medication or study. Patient not taking: Reported on 04/03/2024 10/27/23   Rosemarie Eather RAMAN, MD    Scheduled Meds:  amLODipine   10 mg Oral Daily   aspirin  EC  81 mg Oral Daily   clopidogrel   75 mg Oral Daily   enoxaparin  (LOVENOX ) injection  40 mg Subcutaneous Q24H   ezetimibe   10 mg Oral Daily   insulin  aspart  0-5 Units Subcutaneous QHS   insulin  aspart  0-6 Units Subcutaneous TID WC   magnesium  hydroxide  30 mL Oral Daily   polycarbophil  625 mg Oral BID   rosuvastatin   40 mg Oral Daily   senna-docusate  1 tablet Oral BID   sertraline   100 mg Oral QHS   sodium chloride  flush  3 mL Intravenous Q12H   traZODone   100 mg Oral QHS   Continuous Infusions:  sodium chloride      sodium chloride      lactated ringers      piperacillin -tazobactam     PRN Meds:.sodium chloride , acetaminophen  **OR** acetaminophen , alum & mag hydroxide-simeth, bisacodyl , HYDROmorphone  (DILAUDID ) injection, hydrOXYzine , lactated ringers , magic mouthwash, menthol , naphazoline-glycerin, ondansetron  **OR** ondansetron  (ZOFRAN ) IV, phenol, simethicone , sodium chloride , sodium chloride  flush  Allergies as of 04/03/2024 - Review Complete 04/03/2024  Allergen Reaction Noted   Diclofenac  sodium Other (See Comments) 12/21/2014   Lisinopril  Cough 06/09/2013   Losartan  Cough 09/25/2015    Family History  Problem Relation Age of Onset   Diabetes Mother    Other Father        death by MVA   Other Brother        died of MVA   Stroke Neg Hx    Heart  disease Neg Hx    Cancer Neg Hx    Hypertension Neg Hx     Social History   Socioeconomic History   Marital status: Married    Spouse name: Not on file   Number of children: Not on file   Years of education: Not on file   Highest education level: Not on file  Occupational History   Occupation: retired    Comment: prior physical therapist  Tobacco Use   Smoking status: Former    Current packs/day: 0.25    Types: Cigarettes   Smokeless tobacco: Never   Tobacco comments:    Started smoking again, 1 pack every 4 days  Substance and Sexual Activity   Alcohol use: No   Drug use: No   Sexual activity: Never  Other Topics Concern   Not on file  Social History Narrative   Married 22 years, 2 daughter and 1 son. Retired, used to work as a PT.  Exercises treadmill daily, some weight bearing exercise. Diet    Social Drivers of Corporate investment banker Strain: Not on file  Food Insecurity: No Food Insecurity (04/03/2024)   Hunger Vital Sign    Worried About Running Out of Food in the Last Year: Never true    Ran Out of Food in the Last Year: Never true  Transportation Needs: No Transportation Needs (04/03/2024)   PRAPARE - Administrator, Civil Service (Medical): No    Lack of Transportation (Non-Medical): No  Physical Activity: Not on file  Stress: Not on file  Social Connections: Socially Integrated (04/03/2024)   Social Connection and Isolation Panel    Frequency of Communication with Friends and Family: More than three times a week    Frequency of Social Gatherings with Friends and Family: More than three times a week    Attends Religious Services: More than 4 times per year    Active Member of [employer] or Organizations: Yes    Attends Banker Meetings: More than 4 times per year    Marital Status: Married  Catering manager Violence: Not At Risk (04/03/2024)   Humiliation, Afraid, Rape, and Kick questionnaire    Fear of Current or Ex-Partner: No     Emotionally Abused: No    Physically Abused: No    Sexually Abused: No  Review of Systems: All negative except as stated above in HPI.  Physical Exam: Vital signs: Vitals:   04/04/24 0456 04/04/24 0838  BP: 118/68 125/77  Pulse: 96 93  Resp: 18   Temp: 98.6 F (37 C) 98.3 F (36.8 C)  SpO2: 97% 98%   Last BM Date :  (PTA) General:  Lethargic, well-nourished, mild acute distress, pleasant  Head: normocephalic, atraumatic Eyes: anicteric sclera ENT: oropharynx clear Neck: supple, nontender Lungs:  Clear throughout to auscultation.   No wheezes, crackles, or rhonchi. No acute distress. Heart:  Regular rate and rhythm; no murmurs, clicks, rubs,  or gallops. Abdomen: upper quadrant tenderness (greatest in epigastric and RUQ) with guarding, soft, nondistended, +BS, JP drain in RUQ with dark bilious drainage Rectal:  Deferred Ext: no edema  GI:  Lab Results: Recent Labs    04/03/24 1450 04/03/24 1523 04/04/24 0412  WBC 15.5*  --  13.5*  HGB 9.9* 13.6 12.6*  HCT 27.7* 40.0 35.5*  PLT 325  --  273   BMET Recent Labs    04/03/24 1523 04/03/24 1650 04/04/24 0412  NA 131* 135 131*  K 6.3* 3.6 3.6  CL 98 109 99  CO2  --  19* 23  GLUCOSE 132* 76 84  BUN 8 6* 7*  CREATININE 0.60* 0.49* 0.72  CALCIUM   --  7.3* 9.0   LFT Recent Labs    04/04/24 0412  PROT 6.5  ALBUMIN 2.8*  AST 18  ALT 17  ALKPHOS 56  BILITOT 1.0   PT/INR No results for input(s): LABPROT, INR in the last 72 hours.     Impression/Plan: Post-ERCP pancreatitis - s/p biliary stent. JP drain with fluid collection likely from remnant GB and bile leak. Leucocytosis improving. Continue Zosyn . Blood cultures pending. Supportive care. Changed to NPO except ice chips and if stable then start clear liquids tomorrow. Will follow.    LOS: 1 day   [name]  04/04/2024, 1:27 PM  Questions please call [phone]

## 2024-04-04 NOTE — Assessment & Plan Note (Signed)
 Patient with symptoms of abdominal pain and nausea and vomiting on admission, nausea and vomiting has been improved and he is tolerating diet.  Significantly elevated lipase, although no radiologic evidence of pancreatic inflammation but patient likely developed post ERCP acute pancreatitis. - Continue with supportive care -Continue with pain management

## 2024-04-04 NOTE — Assessment & Plan Note (Signed)
 Hemoglobin at 12.6.  No obvious bleeding but patient was on aspirin  and Plavix .  FOBT was ordered by admitting provider but still no BM. - Continue to monitor

## 2024-04-04 NOTE — Assessment & Plan Note (Signed)
 Patient has an history of cryptogenic stroke. - Continue home aspirin , Plavix  and statin

## 2024-04-04 NOTE — Assessment & Plan Note (Signed)
CBG within goal. -Continue with SSI 

## 2024-04-04 NOTE — Assessment & Plan Note (Signed)
 Magnesium  was 1.5 yesterday which was repleted. - Monitor magnesium 

## 2024-04-04 NOTE — Progress Notes (Signed)
 04/04/2024  Jerry [name] [id number] 06-Jun-1956  CARE TEAM: PCP: Lazoff, Shawn P, DO  Outpatient Care Team: Patient Care Team: Lazoff, Shawn P, DO as PCP - General (Family Medicine)  Inpatient Treatment Team: Treatment Team:  Caleen Qualia, MD Ccs, Md, MD Bobbette Hatcher, MD Hilaria Schwab, RN Migdalia Feliciano RAMAN, [place] Robynn Eileen PARAS, RN Loni Lover, Saurav Chandra, [place] Leila Knee, RN   Problem List:   Principal Problem:   Intra-abdominal infection Active Problems:   Non-insulin  dependent type 2 diabetes mellitus (HCC)   Hyperlipidemia   Essential hypertension   Cryptogenic stroke [facility])   Hypomagnesemia   Generalized anxiety disorder   History of cholecystectomy-status post biliary stent and JP drain in place   History of hyperlipidemia   History of insomnia   Insomnia   03/14/24   Postoperative Diagnosis: Gangrenous cholecystitis   Procedure: Laparoscopic subtotal fenestrating cholecystectomy   Surgeon: Leonor Dawn, MD   Findings: Severe acute cholecystitis with gangrenous patches. The cystic duct could not be identified due to extensive inflammation, thus a subtotal fenestrating cholecystectomy was performed. A 19-Fr JP drain was left by the remnant gallbladder. Cholangiogram was not able to be performed due to cystic duct occlusion.   04/02/2024  Procedure:              ERCP  Indications:             Bile duct stone(s), Bile leak  Providers:               Oliva Boots, MD, [place]   Impression:                - One temporary plastic pancreatic stent was placed into the ventral pancreatic duct. There was no dye                            injected into the pancreas and there was old food in the stomach - A biliary sphincterotomy was performed. - The biliary tree was swept and sludge was found. - One temporary plastic biliary stent was placed into the common bile duct. - One pancreatic duct stent was removed from Duodenum after it had fallen out  as above.   Assessment [facility] Stay = 1 days)      Post ERCP pancreatitis in the setting of need for ERCP and stenting after subtotal cholecystectomy for gangrenous cholecystitis and leak    Plan:  Continue internal drainage with stenting of common bile duct by Dr. Boots with [facility] and external Feliciano drain placed by Dr. Dawn at the time of his subtotal cholecystectomy for gangrenous cholecystitis.  Should allow the bile leak to gradually seal on its own once nutrition improved.  I explained to patient and his wife this will take several weeks of time.  Recommended dressing around drain site to help cover and protect it since he is sensitive there.  -Able to tolerate advance on diet.  Would focus on low-fat diet to avoid worsening pancreatitis.  Pain not worse and lipase slightly improved guardedly hopeful.  If struggling with other issues can get CT scan in about 4-5 days to rule out delayed abscess pseudocyst or necrosis.  Seems unlikely.  Leukocytosis most likely due to pancreatitis.  Small gas/fluid collection in gallbladder fossa some liver expected in the setting of gangrenous cholecystitis with subcholecystectomy.  Drain in good position and size of cavity rather small.  I agreed no need for repeat drainage or  other intervention.  No need for antibiotics from abdominal standpoint/surgery standpoint.  IV fluid resuscitation.  Keeping on Plavix  anticoagulation given his history of cryptogenic strokes.  I believe they are starting that today.  -monitor electrolytes & replace as needed  Keep K>4, Mg>2, Phos>3  -VTE prophylaxis- SCDs.  Anticoagulation prophyllaxis SQ as appropriate  -mobilize as tolerated to help recovery.  Enlist therapies in moderate/high risk patients as appropriate.  He seems rather deconditioned.  See what therapist think about a better plan.  I updated the patient's status to the patient and spouse  Recommendations were made.  Questions were answered.   They expressed understanding & appreciation.  -Disposition: Hopefully home in the next day or so if he continues to improve.  Outpatient follow-up with Dr. Dasie.  Suspect surgical drain will need to stay in several more weeks.  Once output has gone down and is no longer bilious, that is a hopeful sign to consider removal.  For to Dr. Dasie operating surgeon       I reviewed nursing notes, ED provider notes, hospitalist notes, last 24 h vitals and pain scores, last 48 h intake and output, last 24 h labs and trends, and last 24 h imaging results.  I have reviewed this patient's available data, including medical history, events of note, test results, etc as part of my evaluation.   A significant portion of that time was spent in counseling. Care during the described time interval was provided by me.  This care required moderate level of medical decision making.  04/04/2024    Subjective: (Chief complaint)  Patient with some soreness but not worse.  Not vomiting anymore.  Wife at bedside.  Able to walk in room.  Objective:  Vital signs:  Vitals:   04/03/24 1900 04/03/24 2135 04/04/24 0456 04/04/24 0838  BP: (!) 156/99 (!) 150/92 118/68 125/77  Pulse: 90 96 96 93  Resp: 17 (!) 21 18   Temp:  98.2 F (36.8 C) 98.6 F (37 C) 98.3 F (36.8 C)  TempSrc:  Oral Oral Oral  SpO2: 100% 100% 97% 98%  Weight:  93 kg    Height:  6' 4 (1.93 m)      Last BM Date :  (PTA)  Intake/Output   Yesterday:  09/27 0701 - 09/28 0700 In: 490 [P.O.:440; IV Piggyback:50] Out: 360 [Urine:275; Drains:85] This shift:  No intake/output data recorded.  Bowel function:  Flatus: YES  BM:  No  Drain: Bilious   Physical Exam:  General: Pt awake/alert in no acute distress Eyes: PERRL, normal EOM.  Sclera clear.  No icterus Neuro: CN II-XII intact w/o focal sensory/motor deficits. Lymph: No head/neck/groin lymphadenopathy Psych:  No delerium/psychosis/paranoia.  Oriented x 4 HENT:  Normocephalic, Mucus membranes moist.  No thrush Neck: Supple, No tracheal deviation.  No obvious thyromegaly Chest: No pain to chest wall compression.  Good respiratory excursion.  No audible wheezing CV:  Pulses intact.  Regular rhythm.  No major extremity edema MS: Normal AROM mjr joints.  No obvious deformity  Abdomen: Soft.  Mildy distended.  Discomfort around drain site without any cellulitis or abscess in right flank.  Mild epigastric discomfort..  No evidence of peritonitis.  No incarcerated hernias.  Ext:  No deformity.  No mjr edema.  No cyanosis Skin: No petechiae / purpurea.  No major sores.  Warm and dry    Results:   Cultures: Recent Results (from the past 720 hours)  Resp panel by RT-PCR (RSV, Flu A&B,  Covid) Anterior Nasal Swab     Status: None   Collection Time: 03/10/24  1:01 PM   Specimen: Anterior Nasal Swab  Result Value Ref Range Status   SARS Coronavirus 2 by RT PCR NEGATIVE NEGATIVE Final    Comment: (NOTE) SARS-CoV-2 target nucleic acids are NOT DETECTED.  The SARS-CoV-2 RNA is generally detectable in upper respiratory specimens during the acute phase of infection. The lowest concentration of SARS-CoV-2 viral copies this assay can detect is 138 copies/mL. A negative result does not preclude SARS-Cov-2 infection and should not be used as the sole basis for treatment or other patient management decisions. A negative result may occur with  improper specimen collection/handling, submission of specimen other than nasopharyngeal swab, presence of viral mutation(s) within the areas targeted by this assay, and inadequate number of viral copies(<138 copies/mL). A negative result must be combined with clinical observations, patient history, and epidemiological information. The expected result is Negative.  Fact Sheet for Patients:  BloggerCourse.com  Fact Sheet for Healthcare Providers:  SeriousBroker.it  This  test is no t yet approved or cleared by the [place]  FDA and  has been authorized for detection and/or diagnosis of SARS-CoV-2 by FDA under an Emergency Use Authorization (EUA). This EUA will remain  in effect (meaning this test can be used) for the duration of the COVID-19 declaration under Section 564(b)(1) of the Act, 21 U.S.C.section 360bbb-3(b)(1), unless the authorization is terminated  or revoked sooner.       Influenza A by PCR NEGATIVE NEGATIVE Final   Influenza B by PCR NEGATIVE NEGATIVE Final    Comment: (NOTE) The Xpert Xpress SARS-CoV-2/FLU/RSV plus assay is intended as an aid in the diagnosis of influenza from Nasopharyngeal swab specimens and should not be used as a sole basis for treatment. Nasal washings and aspirates are unacceptable for Xpert Xpress SARS-CoV-2/FLU/RSV testing.  Fact Sheet for Patients: BloggerCourse.com  Fact Sheet for Healthcare Providers: SeriousBroker.it  This test is not yet approved or cleared by the [place]  FDA and has been authorized for detection and/or diagnosis of SARS-CoV-2 by FDA under an Emergency Use Authorization (EUA). This EUA will remain in effect (meaning this test can be used) for the duration of the COVID-19 declaration under Section 564(b)(1) of the Act, 21 U.S.C. section 360bbb-3(b)(1), unless the authorization is terminated or revoked.     Resp Syncytial Virus by PCR NEGATIVE NEGATIVE Final    Comment: (NOTE) Fact Sheet for Patients: BloggerCourse.com  Fact Sheet for Healthcare Providers: SeriousBroker.it  This test is not yet approved or cleared by the [place]  FDA and has been authorized for detection and/or diagnosis of SARS-CoV-2 by FDA under an Emergency Use Authorization (EUA). This EUA will remain in effect (meaning this test can be used) for the duration of the COVID-19 declaration under  Section 564(b)(1) of the Act, 21 U.S.C. section 360bbb-3(b)(1), unless the authorization is terminated or revoked.  Performed at [facility], [address] [place], [place] [address]   Blood culture (routine x 2)     Status: None   Collection Time: 03/10/24  3:09 PM   Specimen: BLOOD  Result Value Ref Range Status   Specimen Description   Final    BLOOD BLOOD RIGHT FOREARM Performed at [facility], [address], [place], [place] [address]    Special Requests   Final    BOTTLES DRAWN AEROBIC AND ANAEROBIC Blood Culture adequate volume Performed at [facility], [address] [place], [place] [address]  Culture   Final    NO GROWTH 5 DAYS Performed at [facility], [address] [place], [place] [address]    Report Status 03/15/2024 FINAL  Final  Blood culture (routine x 2)     Status: None   Collection Time: 03/10/24  3:10 PM   Specimen: BLOOD  Result Value Ref Range Status   Specimen Description   Final    BLOOD BLOOD LEFT FOREARM Performed at [facility], [address] [place], [place] [address]    Special Requests   Final    BOTTLES DRAWN AEROBIC ONLY Blood Culture adequate volume Performed at [facility], [address] [place], [place] [address]    Culture   Final    NO GROWTH 5 DAYS Performed at [facility], [address], [place], [place] [address]    Report Status 03/15/2024 FINAL  Final  Culture, blood (Routine X 2) w Reflex to ID Panel     Status: None (Preliminary result)   Collection Time: 04/03/24  8:42 PM   Specimen: BLOOD LEFT ARM  Result Value Ref Range Status   Specimen Description BLOOD LEFT ARM  Final   Special Requests   Final    BOTTLES DRAWN AEROBIC AND ANAEROBIC Blood Culture adequate volume   Culture   Final    NO GROWTH < 12 HOURS Performed at [facility], [address] [place], [place] [address]     Report Status PENDING  Incomplete  Culture, blood (Routine X 2) w Reflex to ID Panel     Status: None (Preliminary result)   Collection Time: 04/03/24  8:56 PM   Specimen: BLOOD RIGHT HAND  Result Value Ref Range Status   Specimen Description BLOOD RIGHT HAND  Final   Special Requests   Final    BOTTLES DRAWN AEROBIC AND ANAEROBIC Blood Culture adequate volume   Culture   Final    NO GROWTH < 12 HOURS Performed at [facility], [address] [place], [place] [address]    Report Status PENDING  Incomplete    Labs: Results for orders placed or performed during the hospital encounter of 04/03/24 (from the past 48 hours)  CBC with Diff     Status: Abnormal   Collection Time: 04/03/24  2:50 PM  Result Value Ref Range   WBC 15.5 (H) 4.0 - 10.5 K/uL   RBC 2.82 (L) 4.22 - 5.81 MIL/uL   Hemoglobin 9.9 (L) 13.0 - 17.0 g/dL   HCT 72.2 (L) 60.9 - 47.9 %   MCV 98.2 80.0 - 100.0 fL   MCH 35.1 (H) 26.0 - 34.0 pg   MCHC 35.7 30.0 - 36.0 g/dL   RDW 88.1 88.4 - 84.4 %   Platelets 325 150 - 400 K/uL   nRBC 0.0 0.0 - 0.2 %   Neutrophils Relative % 80 %   Neutro Abs 12.4 (H) 1.7 - 7.7 K/uL   Lymphocytes Relative 10 %   Lymphs Abs 1.6 0.7 - 4.0 K/uL   Monocytes Relative 9 %   Monocytes Absolute 1.4 (H) 0.1 - 1.0 K/uL   Eosinophils Relative 0 %   Eosinophils Absolute 0.0 0.0 - 0.5 K/uL   Basophils Relative 0 %   Basophils Absolute 0.0 0.0 - 0.1 K/uL   Immature Granulocytes 1 %   Abs Immature Granulocytes 0.07 0.00 - 0.07 K/uL    Comment: Performed at [facility], [address], [place], [place]  [address]  I-stat chem 8, ED     Status: Abnormal   Collection Time: 04/03/24  3:23 PM  Result Value Ref Range   Sodium 131 (L) 135 - 145 mmol/L   Potassium 6.3 (HH) 3.5 - 5.1 mmol/L   Chloride 98 98 - 111 mmol/L   BUN 8 8 - 23 mg/dL   Creatinine, Ser 9.39 (L) 0.61 - 1.24 mg/dL   Glucose, Bld 867 (H) 70 - 99 mg/dL    Comment: Glucose reference range applies only to samples taken  after fasting for at least 8 hours.   Calcium , Ion 1.07 (L) 1.15 - 1.40 mmol/L   TCO2 28 22 - 32 mmol/L   Hemoglobin 13.6 13.0 - 17.0 g/dL   HCT 59.9 60.9 - 47.9 %   Comment NOTIFIED PHYSICIAN   I-Stat Lactic Acid, ED     Status: None   Collection Time: 04/03/24  3:27 PM  Result Value Ref Range   Lactic Acid, Venous 1.7 0.5 - 1.9 mmol/L  Type and screen [facility]     Status: None   Collection Time: 04/03/24  3:28 PM  Result Value Ref Range   ABO/RH(D) A POS    Antibody Screen NEG    Sample Expiration      04/06/2024,2359 Performed at [facility], [address], [place], [place] [address]   Urinalysis, Routine w reflex microscopic -Urine, Clean Catch     Status: Abnormal   Collection Time: 04/03/24  4:39 PM  Result Value Ref Range   Color, Urine YELLOW YELLOW   APPearance HAZY (A) CLEAR   Specific Gravity, Urine 1.008 1.005 - 1.030   pH 8.0 5.0 - 8.0   Glucose, UA NEGATIVE NEGATIVE mg/dL   Hgb urine dipstick NEGATIVE NEGATIVE   Bilirubin Urine NEGATIVE NEGATIVE   Ketones, ur NEGATIVE NEGATIVE mg/dL   Protein, ur NEGATIVE NEGATIVE mg/dL   Nitrite NEGATIVE NEGATIVE   Leukocytes,Ua NEGATIVE NEGATIVE    Comment: Performed at [facility], [address] [place], [place] [address]  Comprehensive metabolic panel with GFR     Status: Abnormal   Collection Time: 04/03/24  4:50 PM  Result Value Ref Range   Sodium 135 135 - 145 mmol/L   Potassium 3.6 3.5 - 5.1 mmol/L    Comment: HEMOLYSIS AT THIS LEVEL MAY AFFECT RESULT   Chloride 109 98 - 111 mmol/L   CO2 19 (L) 22 - 32 mmol/L   Glucose, Bld 76 70 - 99 mg/dL    Comment: Glucose reference range applies only to samples taken after fasting for at least 8 hours.   BUN 6 (L) 8 - 23 mg/dL   Creatinine, Ser 9.50 (L) 0.61 - 1.24 mg/dL   Calcium  7.3 (L) 8.9 - 10.3 mg/dL   Total Protein 5.2 (L) 6.5 - 8.1 g/dL   Albumin 2.3 (L) 3.5 - 5.0 g/dL   AST 20 15 - 41 U/L    Comment: HEMOLYSIS AT THIS LEVEL MAY  AFFECT RESULT   ALT 11 0 - 44 U/L    Comment: HEMOLYSIS AT THIS LEVEL MAY AFFECT RESULT   Alkaline Phosphatase 44 38 - 126 U/L   Total Bilirubin 1.4 (H) 0.0 - 1.2 mg/dL    Comment: HEMOLYSIS AT THIS LEVEL MAY AFFECT RESULT   GFR, Estimated >60 >60 mL/min    Comment: (NOTE) Calculated using the CKD-EPI Creatinine Equation (2021)    Anion gap 7 5 - 15    Comment: Performed at  [facility], [address] [place], [place] [address]  Magnesium      Status: Abnormal   Collection Time: 04/03/24  4:50 PM  Result Value Ref Range   Magnesium  1.5 (L) 1.7 - 2.4 mg/dL    Comment: Performed at [facility], [address], [place], [place] [address]  Culture, blood (Routine X 2) w Reflex to ID Panel     Status: None (Preliminary result)   Collection Time: 04/03/24  8:42 PM   Specimen: BLOOD LEFT ARM  Result Value Ref Range   Specimen Description BLOOD LEFT ARM    Special Requests      BOTTLES DRAWN AEROBIC AND ANAEROBIC Blood Culture adequate volume   Culture      NO GROWTH < 12 HOURS Performed at [facility], [address], [place], [place] [address]    Report Status PENDING   Lipase, blood     Status: Abnormal   Collection Time: 04/03/24  8:42 PM  Result Value Ref Range   Lipase 1,008 (H) 11 - 51 U/L    Comment: Performed at [facility], [address] [place], [place] [address]  Culture, blood (Routine X 2) w Reflex to ID Panel     Status: None (Preliminary result)   Collection Time: 04/03/24  8:56 PM   Specimen: BLOOD RIGHT HAND  Result Value Ref Range   Specimen Description BLOOD RIGHT HAND    Special Requests      BOTTLES DRAWN AEROBIC AND ANAEROBIC Blood Culture adequate volume   Culture      NO GROWTH < 12 HOURS Performed at [facility], [address], [place], [place] [address]    Report Status PENDING   Glucose, capillary     Status: None   Collection Time: 04/03/24  9:42 PM  Result Value Ref Range   Glucose-Capillary 86  70 - 99 mg/dL    Comment: Glucose reference range applies only to samples taken after fasting for at least 8 hours.   Comment 1 Document in Chart   Lipase, blood     Status: Abnormal   Collection Time: 04/03/24 10:34 PM  Result Value Ref Range   Lipase 907 (H) 11 - 51 U/L    Comment: Performed at [facility], [address], [place], [place] [address]  CBC     Status: Abnormal   Collection Time: 04/04/24  4:12 AM  Result Value Ref Range   WBC 13.5 (H) 4.0 - 10.5 K/uL   RBC 3.66 (L) 4.22 - 5.81 MIL/uL   Hemoglobin 12.6 (L) 13.0 - 17.0 g/dL    Comment: REPEATED TO VERIFY DELTA CHECK NOTED    HCT 35.5 (L) 39.0 - 52.0 %   MCV 97.0 80.0 - 100.0 fL   MCH 34.4 (H) 26.0 - 34.0 pg   MCHC 35.5 30.0 - 36.0 g/dL   RDW 88.1 88.4 - 84.4 %   Platelets 273 150 - 400 K/uL   nRBC 0.0 0.0 - 0.2 %    Comment: Performed at [facility], [address], [place], [place] [address]  Comprehensive metabolic panel     Status: Abnormal   Collection Time: 04/04/24  4:12 AM  Result Value Ref Range   Sodium 131 (L) 135 - 145 mmol/L   Potassium 3.6 3.5 - 5.1 mmol/L   Chloride 99 98 - 111 mmol/L   CO2 23 22 - 32 mmol/L   Glucose, Bld 84  70 - 99 mg/dL    Comment: Glucose reference range applies only to samples taken after fasting for at least 8 hours.   BUN 7 (L) 8 - 23 mg/dL   Creatinine, Ser 9.27 0.61 - 1.24 mg/dL   Calcium  9.0 8.9 - 10.3 mg/dL   Total Protein 6.5 6.5 - 8.1 g/dL   Albumin 2.8 (L) 3.5 - 5.0 g/dL   AST 18 15 - 41 U/L   ALT 17 0 - 44 U/L   Alkaline Phosphatase 56 38 - 126 U/L   Total Bilirubin 1.0 0.0 - 1.2 mg/dL   GFR, Estimated >39 >39 mL/min    Comment: (NOTE) Calculated using the CKD-EPI Creatinine Equation (2021)    Anion gap 9 5 - 15    Comment: Performed at [facility], [address] [place], [place] [address]  Glucose, capillary     Status: None   Collection Time: 04/04/24  8:38 AM  Result Value Ref Range   Glucose-Capillary 88 70 - 99  mg/dL    Comment: Glucose reference range applies only to samples taken after fasting for at least 8 hours.    Imaging / Studies: CT ABDOMEN PELVIS W CONTRAST Result Date: 04/03/2024 CLINICAL DATA:  Postop abdominal pain.  Cholecystectomy 03/14/2024. EXAM: CT ABDOMEN AND PELVIS WITH CONTRAST TECHNIQUE: Multidetector CT imaging of the abdomen and pelvis was performed using the standard protocol following bolus administration of intravenous contrast. RADIATION DOSE REDUCTION: This exam was performed according to the departmental dose-optimization program which includes automated exposure control, adjustment of the mA and/or kV according to patient size and/or use of iterative reconstruction technique. CONTRAST:  75mL OMNIPAQUE  IOHEXOL  350 MG/ML SOLN COMPARISON:  Abdominopelvic CT 03/10/2024. FINDINGS: Lower chest: Mild linear atelectasis or scarring at both lung bases. There is no confluent airspace disease or significant pleural effusion. Aortic and coronary artery atherosclerosis noted. Hepatobiliary: The liver is normal in density without suspicious focal abnormality. Interval cholecystectomy and plastic biliary stent placement. The stent appears well positioned. There is a small amount of pneumobilia and no significant biliary dilatation. There is an ill-defined collection of gas and fluid within the cholecystectomy bed, measuring approximately 6.4 x 3.5 cm on image 34/5. Pancreas: Unremarkable. No pancreatic ductal dilatation or surrounding inflammatory changes. Spleen: Normal in size without focal abnormality. Adrenals/Urinary Tract: Both adrenal glands appear normal. No evidence of urinary tract calculus, suspicious renal lesion or hydronephrosis. Stable small renal cysts bilaterally for which no specific follow-up imaging is recommended. Stable mild bladder wall thickening without surrounding inflammation. Stomach/Bowel: No enteric contrast administered. The stomach appears unremarkable for its degree of  distension. No evidence of bowel wall thickening, distention or surrounding inflammatory change. Prior appendectomy. Moderate stool throughout the colon. Vascular/Lymphatic: There are no enlarged abdominal or pelvic lymph nodes. Aortic and branch vessel atherosclerosis without evidence of aneurysm or large vessel occlusion. The portal, superior mesenteric and splenic veins are patent. Reproductive: Stable mild enlargement of the prostate gland. Other: Stable fat containing left inguinal hernia. Postsurgical changes at the umbilicus. A surgical drain is present within the subhepatic space and traverses the previously-described air-fluid collection in the cholecystectomy bed. No other intra-abdominal fluid collections are identified. No ascites or pneumoperitoneum. Musculoskeletal: No acute or significant osseous findings. Multilevel thoracolumbar scoliosis. Partially ankylosing osteophytes of the sacroiliac joints bilaterally. IMPRESSION: 1. Interval cholecystectomy and plastic biliary stent placement. There is an ill-defined collection of gas and fluid within the cholecystectomy bed which could reflect a postoperative seroma, biloma or abscess. A  surgical drain traverses this collection. 2. No other acute findings or explanation for the patient's symptoms. 3. Stable mild bladder wall thickening and prostatomegaly. 4.  Aortic Atherosclerosis (ICD10-I70.0). Electronically Signed   By: Elsie Perone M.D.   On: 04/03/2024 17:41   DG ERCP Result Date: 04/02/2024 CLINICAL DATA:  [id number] Surgery, elective [id number] ERCP, WITH INTERVENTION IF INDICATED INSERTION, STENT, PANCREATIC DUCT STENT REMOVAL INSERTION, STENT EXAM: ERCP COMPARISON:  CT AP, 03/10/2024.  US  Abdomen, 03/10/2024. FLUOROSCOPY: Exposure Index (as provided by the fluoroscopic device): 103.5 mGy Kerma FINDINGS: Limited single planar images of the RIGHT upper quadrant obtained C-arm. Image demonstrating flexible endoscopy, biliary duct cannulation, and  retrograde cholangiogram. No biliary ductal dilation. No evidence of biliary filling defect is demonstrated. IMPRESSION: Fluoroscopic imaging for ERCP. For complete description of intra procedural findings, please see performing service dictation. Electronically Signed   By: Thom Hall M.D.   On: 04/02/2024 10:31   DG C-Arm 1-60 Min-No Report Result Date: 04/02/2024 Fluoroscopy was utilized by the requesting physician.  No radiographic interpretation.    Medications / Allergies: per chart  Antibiotics: Anti-infectives (From admission, onward)    Start     Dose/Rate Route Frequency Ordered Stop   04/03/24 2200  piperacillin -tazobactam (ZOSYN ) IVPB 3.375 g        3.375 g 12.5 mL/hr over 240 Minutes Intravenous Every 8 hours 04/03/24 2023     04/03/24 1530  piperacillin -tazobactam (ZOSYN ) IVPB 3.375 g        3.375 g 100 mL/hr over 30 Minutes Intravenous  Once 04/03/24 1528 04/03/24 1605         Note: Portions of this report may have been transcribed using voice recognition software. Every effort was made to ensure accuracy; however, inadvertent computerized transcription errors may be present.   Any transcriptional errors that result from this process are unintentional.    Elspeth KYM Schultze, MD, FACS, MASCRS Esophageal, Gastrointestinal & Colorectal Surgery Robotic and Minimally Invasive Surgery  [facility] [address] [place], [place] [address] [phone] Fax [phone] Main  CONTACT INFORMATION: Weekday (9AM-5PM): Call [facility] main office at [phone] Weeknight (5PM-9AM) or Weekend/Holiday: Check EPIC Web Links tab & use AMION (password  [email or web address]) for General Surgery [facility] coverage  Please, DO NOT use SecureChat  (it is not reliable communication to reach operating surgeons & will lead to a delay in care).   Epic staff messaging available for outptient concerns needing 1-2 business day response.       04/04/2024  8:55 AM

## 2024-04-04 NOTE — Progress Notes (Signed)
 Progress Note   Patient: Jerry [name] FMW:[id number] DOB: 23-Apr-1956 DOA: 04/03/2024     1 DOS: the patient was seen and examined on 04/04/2024   Brief hospital course: Partly taken from H&P.  Jerry [name] is a 68 y.o. male with medical history significant of recent gangrenous cholecystitis underwent partial cholecystectomy 9/7 with JP drain followed by biliary leak required ERCP with common bile duct stent placement 9/26, hypertension, DM type II, cryptogenic stroke-on aspirin  and Plavix , hyperlipidemia, chronic hyponatremia, chronic macrocytosis, presented to emergency department complaining of abdominal pain, nausea vomiting and 1 episode of loose stool in last 24 hours.   Per patient and spouse at the bedside patient has purulent discharge into the drain since last night, quantity has decreased since the placement.  No fever or chills.  On presentation mildly elevated blood pressure otherwise afebrile and stable vital.  Labs with leukocytosis of 15.5, hemoglobin 9.9, bicarb 16, T. bili 1.5, magnesium  1.5, lactic acid normal at 1.7 CT abdomen pelvis showing: IMPRESSION: 1. Interval cholecystectomy and plastic biliary stent placement. There is an ill-defined collection of gas and fluid within the cholecystectomy bed which could reflect a postoperative seroma, biloma or abscess. A surgical drain traverses this collection. 2. No other acute findings or explanation for the patient's symptoms. 3. Stable mild bladder wall thickening and prostatomegaly. 4.  Aortic Atherosclerosis (ICD10-I70.0).  GI and surgery was consulted from ED.  Patient was started on Zosyn .  9/28: Vital stable, lipase significantly elevated at 1008, likely has developed post ERCP pancreatitis. Improving leukocytosis at 13.5, hemoglobin 12.6, mild hyponatremia with sodium at 131, bicarb improved to 23 and T. bili improved to 1.  Preliminary blood cultures negative in 12 hours.  General surgery is recommending  supportive care.  Assessment and Plan: * Post-ERCP acute pancreatitis Patient with symptoms of abdominal pain and nausea and vomiting on admission, nausea and vomiting has been improved and he is tolerating diet.  Significantly elevated lipase, although no radiologic evidence of pancreatic inflammation but patient likely developed post ERCP acute pancreatitis. - Continue with supportive care -Continue with pain management  Intra-abdominal infection History of recent cholecystectomy and ERCP. leukocytosis can be due to acute pancreatitis but imaging with concern of some air-fluid level at surgical site. -Improving drain secretions and no purulence - Continue with antibiotics for now - Appreciate general surgery help  Essential hypertension Blood pressure within goal. - Continue with home amlodipine   Non-insulin  dependent type 2 diabetes mellitus (HCC) CBG within goal. -Continue with SSI  Hypomagnesemia Magnesium  was 1.5 yesterday which was repleted. - Monitor magnesium   Hyperlipidemia - Continue home Zetia  and Crestor   Normocytic anemia Hemoglobin at 12.6.  No obvious bleeding but patient was on aspirin  and Plavix .  FOBT was ordered by admitting provider but still no BM. - Continue to monitor  History of hyperlipidemia - Continue Crestor   Generalized anxiety disorder - Continue home Zoloft   History of stroke Patient has an history of cryptogenic stroke. - Continue home aspirin , Plavix  and statin  Insomnia - Continue home trazodone   Subjective: Patient was still having some upper abdominal pain, nausea and vomiting has been improved and he was tolerating diet.  Wife at bedside.  Physical Exam: Vitals:   04/03/24 1900 04/03/24 2135 04/04/24 0456 04/04/24 0838  BP: (!) 156/99 (!) 150/92 118/68 125/77  Pulse: 90 96 96 93  Resp: 17 (!) 21 18   Temp:  98.2 F (36.8 C) 98.6 F (37 C) 98.3 F (36.8 C)  TempSrc:  Oral  Oral Oral  SpO2: 100% 100% 97% 98%  Weight:  93  kg    Height:  6' 4 (1.93 m)     General.  Frail gentleman, in no acute distress. Pulmonary.  Lungs clear bilaterally, normal respiratory effort. CV.  Regular rate and rhythm, no JVD, rub or murmur. Abdomen.  Soft, mild upper abdominal tenderness, nondistended, BS positive.  Surgical drain in place CNS.  Alert and oriented .  No focal neurologic deficit. Extremities.  No edema, no cyanosis, pulses intact and symmetrical. Psychiatry.  Judgment and insight appears normal.   Data Reviewed: Prior data reviewed  Family Communication: Discussed with wife at bedside  Disposition: Status is: Inpatient Remains inpatient appropriate because: Severity of illness  Planned Discharge Destination: Home  DVT prophylaxis.  Lovenox  Time spent: 50 minutes  This record has been created using Conservation officer, historic buildings. Errors have been sought and corrected,but may not always be located. Such creation errors do not reflect on the standard of care.   Author: Amaryllis Dare, MD 04/04/2024 12:37 PM  For on call review [email or web address].

## 2024-04-04 NOTE — Assessment & Plan Note (Addendum)
 History of recent cholecystectomy and ERCP. leukocytosis can be due to acute pancreatitis but imaging with concern of some air-fluid level at surgical site. -Improving drain secretions and no purulence - Continue with antibiotics for now - Appreciate general surgery help

## 2024-04-04 NOTE — Assessment & Plan Note (Signed)
 Blood pressure within goal. - Continue with home amlodipine 

## 2024-04-04 NOTE — Progress Notes (Addendum)
 NEW ADMISSION NOTE   Arrival Method: ED stretcher Mental Orientation: AAOx4 Telemetry: 5M15 Assessment: Completed Skin: See flowsheet IV: RH and LH Pain: 0/10 Tubes: n/a Safety Measures: Safety Fall Prevention Plan has been given, discussed and signed Admission: Completed [facility] Orientation: Patient has been orientated to the room, unit and staff.  Family: wife at bedside   Orders have been reviewed and implemented. Will continue to monitor the patient. Call light has been placed within reach and bed alarm has been activated.

## 2024-04-05 ENCOUNTER — Encounter (HOSPITAL_COMMUNITY): Payer: Self-pay | Admitting: Gastroenterology

## 2024-04-05 ENCOUNTER — Ambulatory Visit: Payer: Self-pay

## 2024-04-05 DIAGNOSIS — K9189 Other postprocedural complications and disorders of digestive system: Secondary | ICD-10-CM | POA: Diagnosis not present

## 2024-04-05 DIAGNOSIS — I639 Cerebral infarction, unspecified: Secondary | ICD-10-CM

## 2024-04-05 DIAGNOSIS — R1011 Right upper quadrant pain: Secondary | ICD-10-CM | POA: Diagnosis not present

## 2024-04-05 DIAGNOSIS — Z9049 Acquired absence of other specified parts of digestive tract: Secondary | ICD-10-CM | POA: Diagnosis not present

## 2024-04-05 DIAGNOSIS — E119 Type 2 diabetes mellitus without complications: Secondary | ICD-10-CM | POA: Diagnosis not present

## 2024-04-05 LAB — BASIC METABOLIC PANEL WITH GFR
Anion gap: 9 (ref 5–15)
BUN: 7 mg/dL — ABNORMAL LOW (ref 8–23)
CO2: 23 mmol/L (ref 22–32)
Calcium: 8.7 mg/dL — ABNORMAL LOW (ref 8.9–10.3)
Chloride: 99 mmol/L (ref 98–111)
Creatinine, Ser: 0.8 mg/dL (ref 0.61–1.24)
GFR, Estimated: >60 mL/min (ref >60)
Glucose, Bld: 79 mg/dL (ref 70–99)
Potassium: 3.7 mmol/L (ref 3.5–5.1)
Sodium: 131 mmol/L — ABNORMAL LOW (ref 135–145)

## 2024-04-05 LAB — HEPATIC FUNCTION PANEL
ALT: 33 U/L (ref 0–44)
AST: 31 U/L (ref 15–41)
Albumin: 2.6 g/dL — ABNORMAL LOW (ref 3.5–5.0)
Alkaline Phosphatase: 61 U/L (ref 38–126)
Bilirubin, Direct: 0.4 mg/dL — ABNORMAL HIGH (ref 0.0–0.2)
Indirect Bilirubin: 1.2 mg/dL — ABNORMAL HIGH (ref 0.3–0.9)
Total Bilirubin: 1.6 mg/dL — ABNORMAL HIGH (ref 0.0–1.2)
Total Protein: 6.4 g/dL — ABNORMAL LOW (ref 6.5–8.1)

## 2024-04-05 LAB — CBC
HCT: 32.8 % — ABNORMAL LOW (ref 39.0–52.0)
Hemoglobin: 11.9 g/dL — ABNORMAL LOW (ref 13.0–17.0)
MCH: 35.1 pg — ABNORMAL HIGH (ref 26.0–34.0)
MCHC: 36.3 g/dL — ABNORMAL HIGH (ref 30.0–36.0)
MCV: 96.8 fL (ref 80.0–100.0)
Platelets: 260 K/uL (ref 150–400)
RBC: 3.39 MIL/uL — ABNORMAL LOW (ref 4.22–5.81)
RDW: 11.8 % (ref 11.5–15.5)
WBC: 14.5 K/uL — ABNORMAL HIGH (ref 4.0–10.5)
nRBC: 0 % (ref 0.0–0.2)

## 2024-04-05 LAB — MAGNESIUM: Magnesium: 1.8 mg/dL (ref 1.7–2.4)

## 2024-04-05 LAB — CUP PACEART REMOTE DEVICE CHECK
Date Time Interrogation Session: 20250928232325
Implantable Pulse Generator Implant Date: 20230417

## 2024-04-05 LAB — GLUCOSE, CAPILLARY
Glucose-Capillary: 108 mg/dL — ABNORMAL HIGH (ref 70–99)
Glucose-Capillary: 104 mg/dL — ABNORMAL HIGH (ref 70–99)

## 2024-04-05 LAB — LIPASE, BLOOD: Lipase: 100 U/L — ABNORMAL HIGH (ref 11–51)

## 2024-04-05 MED ORDER — ONDANSETRON HCL 4 MG PO TABS: 4.0000 mg | ORAL_TABLET | Freq: Four times a day (QID) | ORAL | 0 refills | Status: AC | PRN

## 2024-04-05 MED ORDER — INFLUENZA VAC SPLIT HIGH-DOSE 0.5 ML IM SUSY
0.5000 mL | PREFILLED_SYRINGE | INTRAMUSCULAR | Status: AC
  Administered 2024-04-05: 0.5 mL via INTRAMUSCULAR
  Filled 2024-04-05: qty 0.5

## 2024-04-05 MED ORDER — MAGIC MOUTHWASH: 15.0000 mL | Freq: Four times a day (QID) | ORAL | 0 refills | Status: AC | PRN

## 2024-04-05 NOTE — Progress Notes (Addendum)
 [facility] Gastroenterology Progress Note  Jerry [name] 68 y.o. March 25, 1956   Subjective: Abdominal pain better. Denies worsened pain with clear liquids.  Objective: Vital signs: Vitals:   04/05/24 0043 04/05/24 0803  BP:  134/89  Pulse:  100  Resp:    Temp: 98.3 F (36.8 C) 97.9 F (36.6 C)  SpO2:  100%    Physical Exam: Gen: lethargic, elderly, well-nourished, no acute distress  HEENT: anicteric sclera CV: RRR Chest: CTA B Abd: upper quadrant tenderness with guarding, soft, nondistended, +BS; RUQ JP drain Ext: no edema  Lab Results: Recent Labs    04/03/24 1650 04/04/24 0412 04/05/24 0515  NA 135 131* 131*  K 3.6 3.6 3.7  CL 109 99 99  CO2 19* 23 23  GLUCOSE 76 84 79  BUN 6* 7* 7*  CREATININE 0.49* 0.72 0.80  CALCIUM  7.3* 9.0 8.7*  MG 1.5*  --  1.8   Recent Labs    04/04/24 0412 04/05/24 0515  AST 18 31  ALT 17 33  ALKPHOS 56 61  BILITOT 1.0 1.6*  PROT 6.5 6.4*  ALBUMIN 2.8* 2.6*   Recent Labs    04/03/24 1450 04/03/24 1523 04/04/24 0412 04/05/24 0515  WBC 15.5*  --  13.5* 14.5*  NEUTROABS 12.4*  --   --   --   HGB 9.9*   < > 12.6* 11.9*  HCT 27.7*   < > 35.5* 32.8*  MCV 98.2  --  97.0 96.8  PLT 325  --  273 260   < > = values in this interval not displayed.   Lipase 100 (907)   Assessment/Plan: Post-ERCP pancreatitis - clinically improving. Changed diet to low fat diet. Stable to go home today if tolerates low fat diet without worsened pain. Will have office arrange f/u with Dr. Rosalie for 4-6 weeks.   Jerry [name] 04/05/2024, 10:29 AM  Questions please call [phone] ID: Jerry [name], male   DOB: 03/18/1956, 68 y.o.   MRN: [id number]

## 2024-04-05 NOTE — TOC Transition Note (Signed)
 Transition of Care [facility]) - Discharge Note   Patient Details  Name: Jerry [name] MRN: [id number] Date of Birth: Jul 15, 1955  Transition of [facility]) CM/SW Contact:  Tom-Herbers, Clemie General Daphne, RN Phone Number: 04/05/2024, 3:04 PM   Clinical Narrative:     Patient is scheduled for discharge today.  Readmission Risk Assessment done. Home health info, hospital f/u and discharge instructions on AVS. Wife, Marshall to transport at discharge.  No further ICM needs noted.       Final next level of care: Home/Self Care Barriers to Discharge: Barriers Resolved   Patient Goals and CMS Choice Patient states their goals for this hospitalization and ongoing recovery are:: To return home CMS Medicare.gov Compare Post Acute Care list provided to:: Patient Choice offered to / list presented to : NA      Discharge Placement                Patient to be transferred to facility by: Wife Name of family member notified: [facility]    Discharge Plan and Services Additional resources added to the After Visit Summary for                  DME Arranged: N/A DME Agency: NA       HH Arranged: Charity fundraiser (Resumption of care) HH Agency: [facility] Date [facility] Agency Contacted: 04/05/24 Time HH Agency Contacted: 0930 Representative spoke with at [facility] Agency: Brandi  Social Drivers of Health (SDOH) Interventions SDOH Screenings   Food Insecurity: No Food Insecurity (04/03/2024)  Housing: Low Risk  (04/03/2024)  Transportation Needs: No Transportation Needs (04/03/2024)  Utilities: Not At Risk (04/03/2024)  Depression (PHQ2-9): Medium Risk (03/02/2024)  Social Connections: Socially Integrated (04/03/2024)  Tobacco Use: Medium Risk (04/03/2024)     Readmission Risk Interventions    04/05/2024    3:02 PM  Readmission Risk Prevention Plan  Transportation Screening Complete  PCP or Specialist Appt within 5-7 Days Complete  Home Care Screening Complete  Medication Review (RN CM)  Referral to Pharmacy

## 2024-04-05 NOTE — Progress Notes (Addendum)
 Subjective: CC: Patient reports stable, ongoing, RUQ abdominal pain. Has taken in minimal clears and reports nausea but he does not think pain or nausea is worse after po intake. No vomiting. Last BM yesterday.   Tmax 100.2. HR 100. No hypotension.  WBC 14.5 (13.5).  Cr wnl AST/ALT Alk Phos wnl T. Bili 1.6 from 1 (Direct 0.4, Indirect 1.2) Lipase 1008 --> 907 --> 100  Note reviewed from office on 9/23 w/ daily drain output, patient deconditioning, and plan for ERCP and stent placement with GI.   EDP note reports drain was previously purulent - bilious today. Drain w/ 130cc/24 hours from 85cc the day prior.   Objective: Vital signs in last 24 hours: Temp:  [97.9 F (36.6 C)-100.2 F (37.9 C)] 97.9 F (36.6 C) (09/29 0803) Pulse Rate:  [97-102] 100 (09/29 0803) Resp:  [16] 16 (09/28 1946) BP: (121-134)/(73-89) 134/89 (09/29 0803) SpO2:  [98 %-100 %] 100 % (09/29 0803) Last BM Date : 04/04/24  Intake/Output from previous day: 09/28 0701 - 09/29 0700 In: 208.4 [I.V.:158.4; IV Piggyback:50] Out: 380 [Urine:250; Drains:130] Intake/Output this shift: No intake/output data recorded.  PE: Gen:  Alert, NAD, pleasant Abd: Soft, ND, epigastric and RUQ ttp. JP drain w/ bilious output - 130cc/24 hours from 85cc the day prior.   Lab Results:  Recent Labs    04/04/24 0412 04/05/24 0515  WBC 13.5* 14.5*  HGB 12.6* 11.9*  HCT 35.5* 32.8*  PLT 273 260   BMET Recent Labs    04/04/24 0412 04/05/24 0515  NA 131* 131*  K 3.6 3.7  CL 99 99  CO2 23 23  GLUCOSE 84 79  BUN 7* 7*  CREATININE 0.72 0.80  CALCIUM  9.0 8.7*   PT/INR No results for input(s): LABPROT, INR in the last 72 hours. CMP     Component Value Date/Time   NA 131 (L) 04/05/2024 0515   NA 141 02/05/2022 1431   K 3.7 04/05/2024 0515   CL 99 04/05/2024 0515   CO2 23 04/05/2024 0515   GLUCOSE 79 04/05/2024 0515   BUN 7 (L) 04/05/2024 0515   BUN 12 02/05/2022 1431   CREATININE 0.80 04/05/2024  0515   CREATININE 0.82 08/23/2014 1153   CALCIUM  8.7 (L) 04/05/2024 0515   PROT 6.4 (L) 04/05/2024 0515   ALBUMIN 2.6 (L) 04/05/2024 0515   AST 31 04/05/2024 0515   ALT 33 04/05/2024 0515   ALKPHOS 61 04/05/2024 0515   BILITOT 1.6 (H) 04/05/2024 0515   GFRNONAA >60 04/05/2024 0515   GFRAA >60 11/06/2016 0435   Lipase     Component Value Date/Time   LIPASE 100 (H) 04/05/2024 0515    Studies/Results: CT ABDOMEN PELVIS W CONTRAST Result Date: 04/03/2024 CLINICAL DATA:  Postop abdominal pain.  Cholecystectomy 03/14/2024. EXAM: CT ABDOMEN AND PELVIS WITH CONTRAST TECHNIQUE: Multidetector CT imaging of the abdomen and pelvis was performed using the standard protocol following bolus administration of intravenous contrast. RADIATION DOSE REDUCTION: This exam was performed according to the departmental dose-optimization program which includes automated exposure control, adjustment of the mA and/or kV according to patient size and/or use of iterative reconstruction technique. CONTRAST:  75mL OMNIPAQUE  IOHEXOL  350 MG/ML SOLN COMPARISON:  Abdominopelvic CT 03/10/2024. FINDINGS: Lower chest: Mild linear atelectasis or scarring at both lung bases. There is no confluent airspace disease or significant pleural effusion. Aortic and coronary artery atherosclerosis noted. Hepatobiliary: The liver is normal in density without suspicious focal abnormality. Interval cholecystectomy and plastic biliary  stent placement. The stent appears well positioned. There is a small amount of pneumobilia and no significant biliary dilatation. There is an ill-defined collection of gas and fluid within the cholecystectomy bed, measuring approximately 6.4 x 3.5 cm on image 34/5. Pancreas: Unremarkable. No pancreatic ductal dilatation or surrounding inflammatory changes. Spleen: Normal in size without focal abnormality. Adrenals/Urinary Tract: Both adrenal glands appear normal. No evidence of urinary tract calculus, suspicious renal  lesion or hydronephrosis. Stable small renal cysts bilaterally for which no specific follow-up imaging is recommended. Stable mild bladder wall thickening without surrounding inflammation. Stomach/Bowel: No enteric contrast administered. The stomach appears unremarkable for its degree of distension. No evidence of bowel wall thickening, distention or surrounding inflammatory change. Prior appendectomy. Moderate stool throughout the colon. Vascular/Lymphatic: There are no enlarged abdominal or pelvic lymph nodes. Aortic and branch vessel atherosclerosis without evidence of aneurysm or large vessel occlusion. The portal, superior mesenteric and splenic veins are patent. Reproductive: Stable mild enlargement of the prostate gland. Other: Stable fat containing left inguinal hernia. Postsurgical changes at the umbilicus. A surgical drain is present within the subhepatic space and traverses the previously-described air-fluid collection in the cholecystectomy bed. No other intra-abdominal fluid collections are identified. No ascites or pneumoperitoneum. Musculoskeletal: No acute or significant osseous findings. Multilevel thoracolumbar scoliosis. Partially ankylosing osteophytes of the sacroiliac joints bilaterally. IMPRESSION: 1. Interval cholecystectomy and plastic biliary stent placement. There is an ill-defined collection of gas and fluid within the cholecystectomy bed which could reflect a postoperative seroma, biloma or abscess. A surgical drain traverses this collection. 2. No other acute findings or explanation for the patient's symptoms. 3. Stable mild bladder wall thickening and prostatomegaly. 4.  Aortic Atherosclerosis (ICD10-I70.0). Electronically Signed   By: Elsie Perone M.D.   On: 04/03/2024 17:41    Anti-infectives: Anti-infectives (From admission, onward)    Start     Dose/Rate Route Frequency Ordered Stop   04/04/24 1400  piperacillin -tazobactam (ZOSYN ) IVPB 3.375 g        3.375 g 12.5 mL/hr  over 240 Minutes Intravenous Every 8 hours 04/04/24 0904 04/09/24 1359   04/03/24 2200  piperacillin -tazobactam (ZOSYN ) IVPB 3.375 g  Status:  Discontinued        3.375 g 12.5 mL/hr over 240 Minutes Intravenous Every 8 hours 04/03/24 2023 04/04/24 0904   04/03/24 1530  piperacillin -tazobactam (ZOSYN ) IVPB 3.375 g        3.375 g 100 mL/hr over 30 Minutes Intravenous  Once 04/03/24 1528 04/03/24 1605        Assessment/Plan S/p Laparoscopic subtotal fenestrating cholecystectomy by Dr. Dasie on 03/14/24 for Gangrenous cholecystitis  S/p ERCP and CBD stent placement 9/26 by Dr. Rosalie for Bile leak w/ biliary sphincterotomy performed, biliary tree was swept and sludge found, one temporary plastic biliary stent placed into the common bile duct, and one temporary plastic pancreatic stent placed into the ventral pancreatic duct that was then removed from Duodenum after it had fallen out as above. Post ERCP Pancreatitis  - CT 9/27 w/ 6.4 x 3.5 cm ill-defined collection of gas and fluid within the cholecystectomy bed with surgical drain traversing this collection. Drain appears to be functioning, currently bilious, non-purulent. CBD stent appears in good position.  - Continue JP drain, currently bilious. Monitor daily output and trend.  - Agree with prior notes. Can hold off on IR consult and further drainage at this time. Will follow to ensure he does not need repeat CT while here.  - On abx. I do not think  he needs abx from our standpoint.  - GI following, appreciate recommendations. Diet per their team.  - Trend labs. Lipase downtrending. Direct Bilirubin 0.4 and Alk Phos non-elevated - We will follow with you  FEN - GI advanced to soft diet VTE - SCDs, Plavix , okay for chem ppx from a general surgery standpoint ID - None currently.     LOS: 2 days    Jerry [name], [facility] 04/05/2024, 9:54 AM Please see Amion for pager number during day hours 7:00am-4:30pm

## 2024-04-05 NOTE — Discharge Summary (Signed)
 Physician Discharge Summary   Patient: Jerry [name] MRN: [id number] DOB: 1956-03-20  Admit date:     04/03/2024  Discharge date: 04/05/24  Discharge Physician: Amaryllis Dare   PCP: Lazoff, Shawn P, DO   Recommendations at discharge:  Please obtain CBC and CMP on follow-up Follow-up with general surgery Follow-up with gastroenterology Follow-up with primary care provider  Discharge Diagnoses: Principal Problem:   Post-ERCP acute pancreatitis Active Problems:   Intra-abdominal infection   History of cholecystectomy-status post biliary stent and JP drain in place   Essential hypertension   Non-insulin  dependent type 2 diabetes mellitus (HCC)   Hypomagnesemia   Hyperlipidemia   Normocytic anemia   Generalized anxiety disorder   History of stroke   Insomnia   History of insomnia   Right upper quadrant abdominal pain   Hospital Course: Partly taken from H&P.  Jerry [name] is a 68 y.o. male with medical history significant of recent gangrenous cholecystitis underwent partial cholecystectomy 9/7 with JP drain followed by biliary leak required ERCP with common bile duct stent placement 9/26, hypertension, DM type II, cryptogenic stroke-on aspirin  and Plavix , hyperlipidemia, chronic hyponatremia, chronic macrocytosis, presented to emergency department complaining of abdominal pain, nausea vomiting and 1 episode of loose stool in last 24 hours.   Per patient and spouse at the bedside patient has purulent discharge into the drain since last night, quantity has decreased since the placement.  No fever or chills.  On presentation mildly elevated blood pressure otherwise afebrile and stable vital.  Labs with leukocytosis of 15.5, hemoglobin 9.9, bicarb 16, T. bili 1.5, magnesium  1.5, lactic acid normal at 1.7 CT abdomen pelvis showing: IMPRESSION: 1. Interval cholecystectomy and plastic biliary stent placement. There is an ill-defined collection of gas and fluid within the  cholecystectomy bed which could reflect a postoperative seroma, biloma or abscess. A surgical drain traverses this collection. 2. No other acute findings or explanation for the patient's symptoms. 3. Stable mild bladder wall thickening and prostatomegaly. 4.  Aortic Atherosclerosis (ICD10-I70.0).  GI and surgery was consulted from ED.  Patient was started on Zosyn .  9/28: Vital stable, lipase significantly elevated at 1008, likely has developed post ERCP pancreatitis. Improving leukocytosis at 13.5, hemoglobin 12.6, mild hyponatremia with sodium at 131, bicarb improved to 23 and T. bili improved to 1.  Preliminary blood cultures negative in 12 hours.  General surgery is recommending supportive care.  9/29: Remained hemodynamically stable, significant improvement in pain and tolerated advancement in diet.  Surgery does not think that he need any further antibiotics so it was discontinued.  Blood cultures remain negative.  Lipase with significant improvement to 100.  Patient is being discharged and he will continue his prior home medications.  He need to have a close follow-up with his surgeon and gastroenterologist for further assistance.  Assessment and Plan: * Post-ERCP acute pancreatitis Patient with symptoms of abdominal pain and nausea and vomiting on admission, nausea and vomiting has been improved and he is tolerating diet.  Significantly elevated lipase, although no radiologic evidence of pancreatic inflammation but patient likely developed post ERCP acute pancreatitis. - Continue with supportive care -Continue with pain management  Intra-abdominal infection History of recent cholecystectomy and ERCP. leukocytosis can be due to acute pancreatitis but imaging with concern of some air-fluid level at surgical site. -Improving drain secretions and no purulence Surgery does not think there is any concern of infection so antibiotics are being discontinued  Essential hypertension Blood  pressure within goal. - Continue with  home amlodipine   Non-insulin  dependent type 2 diabetes mellitus (HCC) CBG within goal. -Continue with SSI  Hypomagnesemia Magnesium  was 1.5 yesterday which was repleted. - Monitor magnesium   Hyperlipidemia - Continue home Zetia  and Crestor   Normocytic anemia Hemoglobin at 12.6.  No obvious bleeding but patient was on aspirin  and Plavix .  FOBT was ordered by admitting provider but still no BM. - Continue to monitor  History of hyperlipidemia - Continue Crestor   Generalized anxiety disorder - Continue home Zoloft   History of stroke Patient has an history of cryptogenic stroke. - Continue home aspirin , Plavix  and statin  Insomnia - Continue home trazodone    Pain control - [place]  Controlled Substance Reporting System database was reviewed. and patient was instructed, not to drive, operate heavy machinery, perform activities at heights, swimming or participation in water activities or provide baby-sitting services while on Pain, Sleep and Anxiety Medications; until their outpatient Physician has advised to do so again. Also recommended to not to take more than prescribed Pain, Sleep and Anxiety Medications.  Consultants: General surgery.  Gastroenterology Procedures performed: None Disposition: Home Diet recommendation:  Discharge Diet Orders (From admission, onward)     Start     Ordered   04/05/24 0000  Diet - low sodium heart healthy        04/05/24 1415           Regular diet DISCHARGE MEDICATION: Allergies as of 04/05/2024       Reactions   Diclofenac  Sodium Other (See Comments)   Gel caused a burning sensation    Lisinopril  Cough   Losartan  Cough   Unknown bad side effects        Medication List     PAUSE taking these medications    insulin  glargine 100 UNIT/ML Solostar Pen Wait to take this until your doctor or other care provider tells you to start again. Commonly known as: Lantus  SoloStar Inject 15  Units into the skin 2 (two) times daily.       STOP taking these medications    ezetimibe  10 MG tablet Commonly known as: ZETIA        TAKE these medications    acetaminophen  500 MG tablet Commonly known as: TYLENOL  Take 1,000 mg by mouth every 6 (six) hours as needed for mild pain.   albuterol 108 (90 Base) MCG/ACT inhaler Commonly known as: VENTOLIN HFA SMARTSIG:2 Puff(s) By Mouth Every 4 Hours PRN   amLODipine  10 MG tablet Commonly known as: NORVASC  Take 10 mg by mouth daily.   aspirin  EC 81 MG tablet Take 1 tablet (81 mg total) by mouth daily. Swallow whole.   bisacodyl  10 MG suppository Commonly known as: DULCOLAX Place 1 suppository (10 mg total) rectally daily as needed for moderate constipation.   clopidogrel  75 MG tablet Commonly known as: PLAVIX  Take 1 tablet (75 mg total) by mouth daily.   hydrOXYzine  25 MG capsule Commonly known as: VISTARIL  Take 1 capsule (25 mg total) by mouth 3 (three) times daily as needed for anxiety.   ketorolac  0.5 % ophthalmic solution Commonly known as: ACULAR  Place 1 drop into the right eye as needed (every 3 months after eye injection).   magic mouthwash Soln Take 15 mLs by mouth 4 (four) times daily as needed for mouth pain (sore throat). Suspension contains equal amounts of Maalox Extra Strength, nystatin, and diphenhydramine .   magnesium  hydroxide 400 MG/5ML suspension Commonly known as: MILK OF MAGNESIA Take 30 mLs by mouth daily as needed for mild constipation.  metFORMIN  1000 MG tablet Commonly known as: GLUCOPHAGE  Take 1 tablet (1,000 mg total) by mouth daily. What changed: when to take this   Mounjaro 15 MG/0.5ML Pen Generic drug: tirzepatide Inject 15 mg into the skin once a week. Mondays   OCEANIC-STROKE asundexian or placebo 50 mg tablet Take 1 tablet (50 mg total) by mouth daily. For Investigational Use Only. Take at the same time each day (preferably in the morning). Tablet should be swallowed whole  with water; it CANNOT be crushed or broken. Please contact [facility] if you have any questions regarding this medication or study.   ondansetron  4 MG tablet Commonly known as: ZOFRAN  Take 1 tablet (4 mg total) by mouth every 6 (six) hours as needed for nausea.   oxyCODONE  5 MG immediate release tablet Commonly known as: Oxy IR/ROXICODONE  Take 1 tablet (5 mg total) by mouth every 4 (four) hours as needed for moderate pain (pain score 4-6) or severe pain (pain score 7-10).   rosuvastatin  40 MG tablet Commonly known as: CRESTOR  Take 1 tablet (40 mg total) by mouth daily.   sertraline  100 MG tablet Commonly known as: Zoloft  Take 1 tablet (100 mg total) by mouth in the morning and at bedtime.   traZODone  100 MG tablet Commonly known as: DESYREL  Take 1 tablet (100 mg total) by mouth at bedtime.   TUMS PO Take 2 tablets by mouth daily as needed (reflux).        Follow-up Information     Lazoff, Shawn P, DO. Schedule an appointment as soon as possible for a visit in 1 week(s).   Specialty: Family Medicine Contact information: [phone] [place] [id number] [phone]                Discharge Exam: Fredricka Weights   04/03/24 2135  Weight: 93 kg   General.  Well-developed gentleman, in no acute distress. Pulmonary.  Lungs clear bilaterally, normal respiratory effort. CV.  Regular rate and rhythm, no JVD, rub or murmur. Abdomen.  Soft, nontender, nondistended, BS positive. CNS.  Alert and oriented .  No focal neurologic deficit. Extremities.  No edema, no cyanosis, pulses intact and symmetrical. Psychiatry.  Judgment and insight appears normal.   Condition at discharge: stable  The results of significant diagnostics from this hospitalization (including imaging, microbiology, ancillary and laboratory) are listed below for reference.   Imaging Studies: CT ABDOMEN PELVIS W CONTRAST Result Date: 04/03/2024 CLINICAL DATA:  Postop abdominal  pain.  Cholecystectomy 03/14/2024. EXAM: CT ABDOMEN AND PELVIS WITH CONTRAST TECHNIQUE: Multidetector CT imaging of the abdomen and pelvis was performed using the standard protocol following bolus administration of intravenous contrast. RADIATION DOSE REDUCTION: This exam was performed according to the departmental dose-optimization program which includes automated exposure control, adjustment of the mA and/or kV according to patient size and/or use of iterative reconstruction technique. CONTRAST:  75mL OMNIPAQUE  IOHEXOL  350 MG/ML SOLN COMPARISON:  Abdominopelvic CT 03/10/2024. FINDINGS: Lower chest: Mild linear atelectasis or scarring at both lung bases. There is no confluent airspace disease or significant pleural effusion. Aortic and coronary artery atherosclerosis noted. Hepatobiliary: The liver is normal in density without suspicious focal abnormality. Interval cholecystectomy and plastic biliary stent placement. The stent appears well positioned. There is a small amount of pneumobilia and no significant biliary dilatation. There is an ill-defined collection of gas and fluid within the cholecystectomy bed, measuring approximately 6.4 x 3.5 cm on image 34/5. Pancreas: Unremarkable. No pancreatic ductal dilatation or surrounding inflammatory changes.  Spleen: Normal in size without focal abnormality. Adrenals/Urinary Tract: Both adrenal glands appear normal. No evidence of urinary tract calculus, suspicious renal lesion or hydronephrosis. Stable small renal cysts bilaterally for which no specific follow-up imaging is recommended. Stable mild bladder wall thickening without surrounding inflammation. Stomach/Bowel: No enteric contrast administered. The stomach appears unremarkable for its degree of distension. No evidence of bowel wall thickening, distention or surrounding inflammatory change. Prior appendectomy. Moderate stool throughout the colon. Vascular/Lymphatic: There are no enlarged abdominal or pelvic lymph  nodes. Aortic and branch vessel atherosclerosis without evidence of aneurysm or large vessel occlusion. The portal, superior mesenteric and splenic veins are patent. Reproductive: Stable mild enlargement of the prostate gland. Other: Stable fat containing left inguinal hernia. Postsurgical changes at the umbilicus. A surgical drain is present within the subhepatic space and traverses the previously-described air-fluid collection in the cholecystectomy bed. No other intra-abdominal fluid collections are identified. No ascites or pneumoperitoneum. Musculoskeletal: No acute or significant osseous findings. Multilevel thoracolumbar scoliosis. Partially ankylosing osteophytes of the sacroiliac joints bilaterally. IMPRESSION: 1. Interval cholecystectomy and plastic biliary stent placement. There is an ill-defined collection of gas and fluid within the cholecystectomy bed which could reflect a postoperative seroma, biloma or abscess. A surgical drain traverses this collection. 2. No other acute findings or explanation for the patient's symptoms. 3. Stable mild bladder wall thickening and prostatomegaly. 4.  Aortic Atherosclerosis (ICD10-I70.0). Electronically Signed   By: Elsie Perone M.D.   On: 04/03/2024 17:41   DG ERCP Result Date: 04/02/2024 CLINICAL DATA:  [id number] Surgery, elective [id number] ERCP, WITH INTERVENTION IF INDICATED INSERTION, STENT, PANCREATIC DUCT STENT REMOVAL INSERTION, STENT EXAM: ERCP COMPARISON:  CT AP, 03/10/2024.  US  Abdomen, 03/10/2024. FLUOROSCOPY: Exposure Index (as provided by the fluoroscopic device): 103.5 mGy Kerma FINDINGS: Limited single planar images of the RIGHT upper quadrant obtained C-arm. Image demonstrating flexible endoscopy, biliary duct cannulation, and retrograde cholangiogram. No biliary ductal dilation. No evidence of biliary filling defect is demonstrated. IMPRESSION: Fluoroscopic imaging for ERCP. For complete description of intra procedural findings, please see  performing service dictation. Electronically Signed   By: Thom Hall M.D.   On: 04/02/2024 10:31   DG C-Arm 1-60 Min-No Report Result Date: 04/02/2024 Fluoroscopy was utilized by the requesting physician.  No radiographic interpretation.   DG Chest 2 View Result Date: 03/31/2024 CLINICAL DATA:  Shortness of breath and weakness EXAM: CHEST - 2 VIEW COMPARISON:  11/01/2021 and CT chest 03/10/2024 FINDINGS: Loop recorder noted. Atherosclerotic calcification of the aortic arch. Mild thoracic spondylosis. Heart size within normal limits. Lungs appear clear. No blunting of the costophrenic angles. IMPRESSION: 1. No active cardiopulmonary disease is radiographically apparent. 2. Aortic Atherosclerosis (ICD10-I70.0). Electronically Signed   By: Ryan Salvage M.D.   On: 03/31/2024 10:38   US  Abdomen Limited RUQ (LIVER/GB) Result Date: 03/10/2024 CLINICAL DATA:  Abdominal pain. EXAM: ULTRASOUND ABDOMEN LIMITED RIGHT UPPER QUADRANT COMPARISON:  CT scan of same day. FINDINGS: Gallbladder: Cholelithiasis is noted with moderate gallbladder wall thickening measured at 8 mm. No sonographic Murphy's sign is noted, but pericholecystic fluid is noted. Sludge is noted as well. Largest gallstone measures 5 mm. Probable 5 mm gallbladder polyp is noted as well. Common bile duct: Diameter: 5 mm which is within normal limits Liver: No focal lesion identified. Within normal limits in parenchymal echogenicity. Portal vein is patent on color Doppler imaging with normal direction of blood flow towards the liver. Other: None. IMPRESSION: 1. Cholelithiasis is noted with moderate gallbladder wall thickening and pericholecystic  fluid. No sonographic Murphy's sign is noted. Findings are concerning for possible cholecystitis. HIDA scan may be performed for further evaluation. 2. Probable 5 mm gallbladder polyp is noted. Electronically Signed   By: Lynwood Landy Raddle M.D.   On: 03/10/2024 15:49   CT ABDOMEN PELVIS W CONTRAST Result Date:  03/10/2024 CLINICAL DATA:  Abdominal pain EXAM: CT ABDOMEN AND PELVIS WITH CONTRAST TECHNIQUE: Multidetector CT imaging of the abdomen and pelvis was performed using the standard protocol following bolus administration of intravenous contrast. RADIATION DOSE REDUCTION: This exam was performed according to the departmental dose-optimization program which includes automated exposure control, adjustment of the mA and/or kV according to patient size and/or use of iterative reconstruction technique. CONTRAST:  OMNIPAQUE  IOHEXOL  350 MG/ML SOLN COMPARISON:  CT angio chest March 10, 2024 on August 15, 2022 FINDINGS: Lower chest: Right lung base scarring/subsegmental atelectasis. Hepatobiliary: Hypoattenuating liver parenchyma consistent with hepatic steatosis. Focal fat deposition is segment 4 measuring 1.4 cm, typical location. Gallbladder wall thickening and pericholecystic and subhepatic mild fat stranding with associated cholelithiasis suggestive of acute infectious/inflammatory changes. No pericholecystic fluid collection. Pancreas: Mild atrophic changes of the pancreas. No pancreatic ductal dilatation or mass lesion. Spleen: Normal in size without focal abnormality. Adrenals/Urinary Tract: Simple renal cortical cysts in both kidneys which does not require imaging follow-up. Otherwise unremarkable. No hydronephrosis. Adrenal glands are unremarkable. Bladder is thick-walled and under distended. Stomach/Bowel: Stomach is within normal limits. Scattered colonic diverticula. No evidence of bowel wall thickening, distention, or inflammatory changes. Vascular/Lymphatic: No significant vascular findings are present. No enlarged abdominal or pelvic lymph nodes. Reproductive: Prostatomegaly. Other: Tiny fat containing paraumbilical hernia with a defect measuring 7 mm. Bilateral fat containing inguinal hernias left greater than right. Musculoskeletal: Multilevel degenerative changes of the spine. IMPRESSION:  Cholelithiasis with CT findings suggestive of acute cholecystitis. Correlate with clinical findings. Hepatic steatosis with focal fat in segment 4. Additional findings as above. Electronically Signed   By: Megan  Zare M.D.   On: 03/10/2024 14:51   CT Angio Chest PE W and/or Wo Contrast Addendum Date: 03/10/2024 ADDENDUM REPORT: 03/10/2024 14:26 ADDENDUM: Gallstones with gallbladder distension and inflammatory stranding surrounding the gallbladder concerning for acute cholecystitis. This could be further evaluated with right upper quadrant ultrasound. These results were called by telephone at the time of interpretation on 03/10/2024 at 2:25 pm to provider [facility] , who verbally acknowledged these results. Electronically Signed   By: Franky Crease M.D.   On: 03/10/2024 14:26   Result Date: 03/10/2024 CLINICAL DATA:  Pulmonary embolism (PE) suspected, high prob. Weakness, dizziness, vomiting EXAM: CT ANGIOGRAPHY CHEST WITH CONTRAST TECHNIQUE: Multidetector CT imaging of the chest was performed using the standard protocol during bolus administration of intravenous contrast. Multiplanar CT image reconstructions and MIPs were obtained to evaluate the vascular anatomy. RADIATION DOSE REDUCTION: This exam was performed according to the departmental dose-optimization program which includes automated exposure control, adjustment of the mA and/or kV according to patient size and/or use of iterative reconstruction technique. CONTRAST:  OMNIPAQUE  IOHEXOL  350 MG/ML SOLN COMPARISON:  08/15/2022 FINDINGS: Cardiovascular: Three-vessel coronary artery disease and aortic atherosclerosis. Heart is normal size. Aorta is normal caliber. No filling defects in the pulmonary arteries to suggest pulmonary emboli. Mediastinum/Nodes: No mediastinal, hilar, or axillary adenopathy. Trachea and esophagus are unremarkable. Thyroid  unremarkable. Lungs/Pleura: Linear areas of atelectasis in the lower lobes bilaterally. Nodular area in  the lingula is new since prior study and may also reflect atelectasis, measuring 12 mm. This warrants follow-up.  No effusions. Upper Abdomen: Gallbladder is distended with layering gallstones. Surrounding inflammatory change. Findings concerning for acute cholecystitis. Musculoskeletal: Chest wall soft tissues are unremarkable. No acute bony abnormality. Review of the MIP images confirms the above findings. IMPRESSION: No evidence of pulmonary embolus. Three-vessel coronary artery disease. Lower lobe atelectasis. 12 mm nodular density in the lingula may also reflect atelectasis but recommend follow-up CT and 6 months to ensure resolution. Aortic Atherosclerosis (ICD10-I70.0). Electronically Signed: By: Franky Crease M.D. On: 03/10/2024 14:12    Microbiology: Results for orders placed or performed during the hospital encounter of 04/03/24  Culture, blood (Routine X 2) w Reflex to ID Panel     Status: None (Preliminary result)   Collection Time: 04/03/24  8:42 PM   Specimen: BLOOD LEFT ARM  Result Value Ref Range Status   Specimen Description BLOOD LEFT ARM  Final   Special Requests   Final    BOTTLES DRAWN AEROBIC AND ANAEROBIC Blood Culture adequate volume   Culture   Final    NO GROWTH 2 DAYS Performed at [facility], [address], [place], [place] [address]    Report Status PENDING  Incomplete  Culture, blood (Routine X 2) w Reflex to ID Panel     Status: None (Preliminary result)   Collection Time: 04/03/24  8:56 PM   Specimen: BLOOD RIGHT HAND  Result Value Ref Range Status   Specimen Description BLOOD RIGHT HAND  Final   Special Requests   Final    BOTTLES DRAWN AEROBIC AND ANAEROBIC Blood Culture adequate volume   Culture   Final    NO GROWTH 2 DAYS Performed at [facility], [address] [place], [place] [address]    Report Status PENDING  Incomplete    Labs: CBC: Recent Labs  Lab 04/03/24 1450 04/03/24 1523 04/04/24 0412 04/05/24 0515  WBC 15.5*  --   13.5* 14.5*  NEUTROABS 12.4*  --   --   --   HGB 9.9* 13.6 12.6* 11.9*  HCT 27.7* 40.0 35.5* 32.8*  MCV 98.2  --  97.0 96.8  PLT 325  --  273 260   Basic Metabolic Panel: Recent Labs  Lab 04/03/24 1523 04/03/24 1650 04/04/24 0412 04/05/24 0515  NA 131* 135 131* 131*  K 6.3* 3.6 3.6 3.7  CL 98 109 99 99  CO2  --  19* 23 23  GLUCOSE 132* 76 84 79  BUN 8 6* 7* 7*  CREATININE 0.60* 0.49* 0.72 0.80  CALCIUM   --  7.3* 9.0 8.7*  MG  --  1.5*  --  1.8   Liver Function Tests: Recent Labs  Lab 04/03/24 1650 04/04/24 0412 04/05/24 0515  AST 20 18 31   ALT 11 17 33  ALKPHOS 44 56 61  BILITOT 1.4* 1.0 1.6*  PROT 5.2* 6.5 6.4*  ALBUMIN 2.3* 2.8* 2.6*   CBG: Recent Labs  Lab 04/04/24 1647 04/04/24 1752 04/04/24 1944 04/05/24 0758 04/05/24 1135  GLUCAP 86 85 76 108* 104*    Discharge time spent: greater than 30 minutes.  This record has been created using Conservation officer, historic buildings. Errors have been sought and corrected,but may not always be located. Such creation errors do not reflect on the standard of care.   Signed: Amaryllis Dare, MD [facility] 04/05/2024

## 2024-04-05 NOTE — Evaluation (Signed)
 Occupational Therapy Evaluation and Discharge Patient Details Name: Jerry [name] MRN: [id number] DOB: 04-30-1956 Today's Date: 04/05/2024   History of Present Illness   Pt is a 68 y/o M admitted on 04/03/24 after presenting with c/o abdominal pain, N&V, loose stool. Pt is being treated for post ERCP acute pancreatitis, intra-abdominal infection. PMH: recent gangrenous cholecystitis s/p partial cholecystectomy 9/7 with JP drain followed by biliar leak requiring ERCP with common bile duct stent placement 9/26, HTN, DM2, cryptogenic stroke, HLD, chronic hyponatremia, chronic macrocytosis     Clinical Impressions Pt is at Ind level of function with ADLs and ADL mobility with no AD, Sup to step in/out of shiwer. PTA pt lives with wife and was Ind with all ADLs and mobility, used no ADs. Pt with no LOB, SOB or c/o pain or dizziness. All education completed and no further acute or follow up OT services are indicated at this time. OT will sign off     If plan is discharge home, recommend the following:   Assistance with cooking/housework;Assist for transportation     Functional Status Assessment   Patient has not had a recent decline in their functional status     Equipment Recommendations   None recommended by OT     Recommendations for Other Services         Precautions/Restrictions   Precautions Precautions: Fall Restrictions Weight Bearing Restrictions Per Provider Order: No     Mobility Bed Mobility               General bed mobility comments: pt received up walking with PT    Transfers Overall transfer level: Modified independent Equipment used: None                      Balance Overall balance assessment: Needs assistance Sitting-balance support: Feet supported Sitting balance-Leahy Scale: Good     Standing balance support: During functional activity, No upper extremity supported Standing balance-Leahy Scale: Good                              ADL either performed or assessed with clinical judgement   ADL Overall ADL's : At baseline;Independent                                       General ADL Comments: Ind with ADLs and ADL mobility, Sup to transfer in/out of shower     Vision Ability to See in Adequate Light: 0 Adequate Patient Visual Report: No change from baseline       Perception         Praxis         Pertinent Vitals/Pain Pain Assessment Pain Assessment: No/denies pain     Extremity/Trunk Assessment Upper Extremity Assessment Upper Extremity Assessment: Overall WFL for tasks assessed   Lower Extremity Assessment Lower Extremity Assessment: Defer to PT evaluation   Cervical / Trunk Assessment Cervical / Trunk Assessment: Normal   Communication Communication Communication: No apparent difficulties   Cognition Arousal: Alert Behavior During Therapy: WFL for tasks assessed/performed                                 Following commands: Intact       Cueing  General Comments   Cueing Techniques: Verbal cues  Exercises     Shoulder Instructions      Home Living Family/patient expects to be discharged to:: Private residence Living Arrangements: Spouse/significant other Available Help at Discharge: Family Type of Home: House Home Access: Level entry     Home Layout: Two level;Able to live on main level with bedroom/bathroom     Bathroom Shower/Tub: Tub/shower unit;Walk-in shower   Bathroom Toilet: Standard     Home Equipment: None;Shower seat          Prior Functioning/Environment Prior Level of Function : Driving;Independent/Modified Independent             Mobility Comments: Ind, no AD ADLs Comments: Ind with ADLs, IADLs, home mgt/chores    OT Problem List: Decreased activity tolerance   OT Treatment/Interventions:        OT Goals(Current goals can be found in the care plan section)   Acute Rehab OT  Goals Patient Stated Goal: go home OT Goal Formulation: All assessment and education complete, DC therapy   OT Frequency:       Co-evaluation              AM-PAC OT 6 Clicks Daily Activity     Outcome Measure Help from another person eating meals?: None Help from another person taking care of personal grooming?: None Help from another person toileting, which includes using toliet, bedpan, or urinal?: None Help from another person bathing (including washing, rinsing, drying)?: A Little Help from another person to put on and taking off regular upper body clothing?: None Help from another person to put on and taking off regular lower body clothing?: None 6 Click Score: 23   End of Session    Activity Tolerance: Patient tolerated treatment well Patient left: in bed;Other (comment) (sitting EOB)  OT Visit Diagnosis: Unsteadiness on feet (R26.81);Muscle weakness (generalized) (M62.81)                Time: 8862-8843 OT Time Calculation (min): 19 min Charges:  OT General Charges $OT Visit: 1 Visit OT Evaluation $OT Eval Low Complexity: 1 Low    Jacques Karna Loose 04/05/2024, 12:18 PM

## 2024-04-05 NOTE — Evaluation (Signed)
 Physical Therapy Evaluation Patient Details Name: Jerry [name] MRN: [id number] DOB: Mar 21, 1956 Today's Date: 04/05/2024  History of Present Illness  Pt is a 68 y/o M admitted on 04/03/24 after presenting with c/o abdominal pain, N&V, loose stool. Pt is being treated for post ERCP acute pancreatitis, intra-abdominal infection. PMH: recent gangrenous cholecystitis s/p partial cholecystectomy 9/7 with JP drain followed by biliar leak requiring ERCP with common bile duct stent placement 9/26, HTN, DM2, cryptogenic stroke, HLD, chronic hyponatremia, chronic macrocytosis  Clinical Impression  Pt seen for PT evaluation with pt agreeable to tx. Pt reports prior to admission he was independent without AD, denies falls. On this date, pt is able to ambulate in hallway without AD with supervision, slight lateral sway but no LOB. Pt negotiates flight of stairs in stairwell with B rails & CGA. Will continue to follow pt acutely to address high level balance.         If plan is discharge home, recommend the following: Assist for transportation   Can travel by private vehicle        Equipment Recommendations None recommended by PT  Recommendations for Other Services       Functional Status Assessment Patient has had a recent decline in their functional status and demonstrates the ability to make significant improvements in function in a reasonable and predictable amount of time.     Precautions / Restrictions Precautions Precautions: Fall Restrictions Weight Bearing Restrictions Per Provider Order: No      Mobility  Bed Mobility Overal bed mobility: Needs Assistance Bed Mobility: Supine to Sit     Supine to sit: Modified independent (Device/Increase time), HOB elevated, Used rails, Supervision          Transfers Overall transfer level: Modified independent Equipment used: None                    Ambulation/Gait Ambulation/Gait assistance: Supervision Gait Distance (Feet):  250 Feet Assistive device: None Gait Pattern/deviations: Decreased stride length, Step-through pattern, Decreased step length - left, Decreased step length - right Gait velocity: decreased     General Gait Details: slight lateral sway but not overt LOB  Stairs Stairs: Yes Stairs assistance: Contact guard assist Stair Management: Two rails, Step to pattern Number of Stairs:  (flight (6))    Wheelchair Mobility     Tilt Bed    Modified Rankin (Stroke Patients Only)       Balance Overall balance assessment: Needs assistance Sitting-balance support: Feet supported Sitting balance-Leahy Scale: Good     Standing balance support: During functional activity, No upper extremity supported Standing balance-Leahy Scale: Good                               Pertinent Vitals/Pain Pain Assessment Pain Assessment: No/denies pain    Home Living Family/patient expects to be discharged to:: Private residence Living Arrangements: Spouse/significant other Available Help at Discharge: Family Type of Home: House Home Access: Level entry       Home Layout: Two level;Able to live on main level with bedroom/bathroom Home Equipment: None      Prior Function Prior Level of Function : Driving;Independent/Modified Independent             Mobility Comments: denies falls, ambulatory without AD ADLs Comments: independent     Extremity/Trunk Assessment   Upper Extremity Assessment Upper Extremity Assessment: Overall WFL for tasks assessed    Lower Extremity Assessment Lower  Extremity Assessment: Overall WFL for tasks assessed       Communication   Communication Communication: No apparent difficulties    Cognition Arousal: Alert Behavior During Therapy: WFL for tasks assessed/performed   PT - Cognitive impairments: No apparent impairments                         Following commands: Intact       Cueing Cueing Techniques: Verbal cues      General Comments      Exercises     Assessment/Plan    PT Assessment Patient needs continued PT services  PT Problem List Decreased strength;Decreased balance;Decreased mobility       PT Treatment Interventions Balance training;DME instruction;Gait training;Neuromuscular re-education;Stair training;Functional mobility training;Therapeutic activities;Therapeutic exercise;Patient/family education    PT Goals (Current goals can be found in the Care Plan section)  Acute Rehab PT Goals Patient Stated Goal: none stated PT Goal Formulation: With patient Time For Goal Achievement: 04/19/24 Potential to Achieve Goals: Good    Frequency Min 1X/week     Co-evaluation               AM-PAC PT 6 Clicks Mobility  Outcome Measure Help needed turning from your back to your side while in a flat bed without using bedrails?: None Help needed moving from lying on your back to sitting on the side of a flat bed without using bedrails?: A Little Help needed moving to and from a bed to a chair (including a wheelchair)?: A Little Help needed standing up from a chair using your arms (e.g., wheelchair or bedside chair)?: A Little Help needed to walk in hospital room?: A Little Help needed climbing 3-5 steps with a railing? : A Little 6 Click Score: 19    End of Session   Activity Tolerance: Patient tolerated treatment well Patient left:  (ambulating back to room in handoff from OT)   PT Visit Diagnosis: Unsteadiness on feet (R26.81);Muscle weakness (generalized) (M62.81)    Time: 8868-8857 PT Time Calculation (min) (ACUTE ONLY): 11 min   Charges:   PT Evaluation $PT Eval Low Complexity: 1 Low   PT General Charges $$ ACUTE PT VISIT: 1 Visit         Richerd Pinal, PT, DPT 04/05/24, 11:48 AM   Richerd CHRISTELLA Pinal 04/05/2024, 11:47 AM

## 2024-04-07 NOTE — Progress Notes (Signed)
 Remote Loop Recorder Transmission

## 2024-04-07 NOTE — Progress Notes (Signed)
 PROVIDER:  PUJA GOSAI MACZIS, PA  MRN: [id number] DOB: 07/26/1955 DATE OF ENCOUNTER: 04/08/2024 Interval History:     Jerry [name] is a 68 yo male who presents for postop follow up and drain check after undergoing a lap subtotal fenestrating cholecystectomy on 9/7 for gangrenous cholecystitis by Dr. Dasie. He was discharged on POD2, at which time there was bile-tinged fluid in his drain, with relatively low volume output.   He saw Dr. Dasie on 03/23/2024 at which time the drain output was bilious about 250 mL/day.  The drain output for the first few days after discharge was <135mL per day. Due to consistently higher output, ERCP was performed on 04/02/24 by Dr. Rosalie -1) a plastic pancreatic stent was placed in the pancreatic duct that was then removed from the duodenum after it had fallen out.  He developed post-ERCP pancreatitis and was admitted from 04/03/2024 to 04/05/2024.  CT scan showed a 6.4 x 3.5 cm ill-defined gas and fluid collection within the cholecystectomy bed.  The drain was within the fluid collection so no intervention was needed.  He is presenting to the office today with concerns of cloudy drain output.  His wife states that she has not emptied the drain in the past 3 days.  She last emptied the drain on Monday, 04/05/2024, 60 cc.  She states Monday night he had fever/chills but also mentions that he had the flu shot when he was in the hospital.  He been feeling better since then.  Denies fevers in 24 hours.  His appetite and energy are increasing.  He states he does have some abdominal soreness.  Denies nausea vomiting.  Surgical Pathology: A. 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  Constitutional:      General: He is not in acute distress.    Appearance: Normal appearance.  Pulmonary:     Effort: Pulmonary effort is normal. No respiratory distress.   Abdominal:     General: There is no distension.     Palpations: Abdomen is soft.     Tenderness: There is no abdominal tenderness.     Comments: Incisions are healing well with no erythema, induration or drainage. RUQ JP is bilious/cloudy  Skin:    General: Skin is warm and dry.  Neurological:     General: No focal deficit present.     Mental Status: He is alert and oriented to person, place, and time.        Assessment and Plan:     Jerry [name] is a 68 y.o. male who underwent laparoscopic subtotal fenestrating cholecystectomy on 03/14/24 for acute gangrenous cholecystitis. He has a bile leak, which is controlled with his surgical drain. He clinically appears well without signs of infection.   On exam today, he has some cloudy bilious fluid in his JP drain.  It is now low output.  The JP drain was flushed.  The surgeon I spoke to today recommended getting a CT scan to reevaluate the fluid collection that was seen on prior scan.  If the fluid collection has resolved then the JP drain can possibly be removed.  Hopefully the CT scan will be done today or tomorrow.  Since I am out of the office next week, if the scan is not complete by tomorrow afternoon, I sent a message to Dr. Aron to ask if she would review the imaging and determine  timeline of drain removal.    He will call if he develops any fevers, chills, worsening abdominal pain, or general malaise. He expressed understanding.  Diagnoses and all orders for this visit:  Bile leak, postoperative -     CT abdomen pelvis with contrast  S/P laparoscopic cholecystectomy -     CT abdomen pelvis with contrast -     HYDROcodone -acetaminophen  (NORCO) 5-325 mg tablet; Take 1 tablet by mouth every 6 (six) hours as needed for Pain for up to 5 days       Return for Call after CT scan is complete.   The plan was discussed in detail with the patient today, who expressed understanding.  The patient has my contact information, and  understands to call me with any additional questions or concerns in the interval.  I would be happy to see the patient back sooner if the need arises.   PUJA GOSAI MACZIS, PA

## 2024-04-08 ENCOUNTER — Other Ambulatory Visit (HOSPITAL_BASED_OUTPATIENT_CLINIC_OR_DEPARTMENT_OTHER): Payer: Self-pay | Admitting: Student

## 2024-04-08 DIAGNOSIS — Z9889 Other specified postprocedural states: Secondary | ICD-10-CM

## 2024-04-08 LAB — CULTURE, BLOOD (ROUTINE X 2)
Culture: NO GROWTH
Culture: NO GROWTH
Special Requests: ADEQUATE
Special Requests: ADEQUATE

## 2024-04-08 NOTE — Progress Notes (Signed)
 Remote Loop Recorder Transmission

## 2024-04-09 ENCOUNTER — Ambulatory Visit (HOSPITAL_BASED_OUTPATIENT_CLINIC_OR_DEPARTMENT_OTHER)
Admission: RE | Admit: 2024-04-09 | Discharge: 2024-04-09 | Disposition: A | Discharge status: Home / Self Care | Source: Ambulatory Visit | Attending: Student | Admitting: Student

## 2024-04-09 DIAGNOSIS — K91872 Postprocedural seroma of a digestive system organ or structure following a digestive system procedure: Secondary | ICD-10-CM | POA: Insufficient documentation

## 2024-04-09 DIAGNOSIS — M51369 Other intervertebral disc degeneration, lumbar region without mention of lumbar back pain or lower extremity pain: Secondary | ICD-10-CM | POA: Diagnosis not present

## 2024-04-09 DIAGNOSIS — I700 Atherosclerosis of aorta: Secondary | ICD-10-CM | POA: Diagnosis not present

## 2024-04-09 DIAGNOSIS — N400 Enlarged prostate without lower urinary tract symptoms: Secondary | ICD-10-CM | POA: Diagnosis not present

## 2024-04-09 DIAGNOSIS — Z9049 Acquired absence of other specified parts of digestive tract: Secondary | ICD-10-CM | POA: Insufficient documentation

## 2024-04-09 DIAGNOSIS — Z9889 Other specified postprocedural states: Secondary | ICD-10-CM | POA: Diagnosis present

## 2024-04-09 DIAGNOSIS — M778 Other enthesopathies, not elsewhere classified: Secondary | ICD-10-CM | POA: Insufficient documentation

## 2024-04-09 DIAGNOSIS — I2510 Atherosclerotic heart disease of native coronary artery without angina pectoris: Secondary | ICD-10-CM | POA: Insufficient documentation

## 2024-04-09 DIAGNOSIS — R918 Other nonspecific abnormal finding of lung field: Secondary | ICD-10-CM | POA: Insufficient documentation

## 2024-04-09 DIAGNOSIS — M47816 Spondylosis without myelopathy or radiculopathy, lumbar region: Secondary | ICD-10-CM | POA: Insufficient documentation

## 2024-04-09 MED ORDER — IOHEXOL 300 MG/ML  SOLN
100.0000 mL | Freq: Once | INTRAMUSCULAR | Status: AC | PRN
  Administered 2024-04-09: 100 mL via INTRAVENOUS

## 2024-04-11 ENCOUNTER — Ambulatory Visit: Payer: Self-pay | Admitting: Cardiology

## 2024-04-14 NOTE — Progress Notes (Signed)
 [facility] post TCM follow up 10 day  Date of call: 04/14/24  Discharged from: [facility]  Updates/Changes since last encounter: Per chart review pt had surgery f/u 10/2 has f/u appt today. Pt had pcp f/u 10/3 has repeat labs today.   Current Questions/Concerns: LM requesting callback for any needs or assistance.  Is patient candidate for Navigation: yes, will continue for 30 day outreach.  Ellouise Quale, RN [facility] Navigator [phone]   Electronically signed by: Ellouise CHRISTELLA Quale, RN 04/14/2024 12:14 PM

## 2024-04-14 NOTE — Telephone Encounter (Signed)
 Patient was given oxycodone  out of the hospital which was recently refilled however patient states that he does better on the hydrocodone  (Norco) and would like that instead.  Will send in prescription for Norco and advised patient to give the pharmacist the remaining oxycodone  to get rid of.

## 2024-04-20 ENCOUNTER — Ambulatory Visit (HOSPITAL_BASED_OUTPATIENT_CLINIC_OR_DEPARTMENT_OTHER)
Admission: RE | Admit: 2024-04-20 | Discharge: 2024-04-20 | Disposition: A | Discharge status: Home / Self Care | Source: Ambulatory Visit | Attending: Student | Admitting: Student

## 2024-04-20 ENCOUNTER — Other Ambulatory Visit (HOSPITAL_BASED_OUTPATIENT_CLINIC_OR_DEPARTMENT_OTHER): Payer: Self-pay | Admitting: Student

## 2024-04-20 ENCOUNTER — Encounter (HOSPITAL_BASED_OUTPATIENT_CLINIC_OR_DEPARTMENT_OTHER): Payer: Self-pay

## 2024-04-20 DIAGNOSIS — R0602 Shortness of breath: Secondary | ICD-10-CM | POA: Insufficient documentation

## 2024-04-20 MED ORDER — IOHEXOL 350 MG/ML SOLN
100.0000 mL | Freq: Once | INTRAVENOUS | Status: AC | PRN
  Administered 2024-04-20: 75 mL via INTRAVENOUS

## 2024-04-21 ENCOUNTER — Ambulatory Visit (HOSPITAL_COMMUNITY): Admitting: Licensed Clinical Social Worker

## 2024-04-26 NOTE — Progress Notes (Signed)
 BH MD/PA/NP OP Progress Note  05/06/2024 2:53 PM Jerry [name]  MRN:  [id number]   Assessment and Plan: Patient presents in person for today's visit. Patient appears to be more depressed and irritable in the context of several psychosocial stressors. Reassuringly, he will be going to therapy and is motivated to increase physical activity. Patient's wife is also a strong support. No safety concern at this time. Also discussed to utilize hydroxyzine  up to 3 times in the day instead of utilizing tobacco and he appeared receptive to this recommendation. No medication changes at this time. F/u in 7-8 weeks.  # MDD recurrent, mild w/ anxious distress # GAD # Insomnia - Continue Zoloft  200 mg daily - Continue trazodone  100 mg nightly - Continue hydroxyzine  25 mg TID PRN, for anxiety - Plan on seeing Adam Goldammer for therapy   # Tobacco use d/o - encourage cessation, pt is motivated  # Hx of Complicated grief-resolved  Identifying information: Jerry [name] is a 68 year old patient with a past psychiatric history of reported PTSD, MDD, GAD who presents today for medication management.  Chief Complaint: med mgmt  Subjective   Patient seen with wife.  Since the previous visit, he notes feeling up and down. Stressors include his best friend passing away last week, medical condition of recent pancreatitis, and his daughter went to the ED yesterday.   Regarding symptoms, he notes worsening memory in the past few months and irritability. Patient notes increasing irritability with his wife and states she treats him like a child.  Patient reports the medications are alright specifically with sleep and states feeling less standoffish. He spends most of his time watching tv.  Patient reports good sleep, reporting 6-7 hours. Patient reports improving appetite.   Patient denies current SI, and AVH. He notes HI towards a particular person and he states he avoids them and denies having a plan on  how he would hurt them.  Substance use:  Reports cigarette use, 1 pack lasting a week.  Reports occasional alcohol use- previously drank alcohol weekly  Past Psychiatric History:  Previously seen at [facility] for therapy and medication management Diagnosed with PTSD, MDD and GAD Completed therapy with Dr. Juleen 01/24/2022 - 11/25/2022; reports benefit Previous medications: trazodone  and Lexapro  No history of inpatient psychiatric care No history of suicide attempts  Family Psychiatric History: Granddaughter: Deceased, substance use disorder, bipolar, PTSD - History of depression in multiple sisters - Oldest sister: EtOH use disorder Mother: Dementia   Social History:  Living: with wife Occupation: retired for 3 years, used to be a paramedic Relationship: married for 44 years Children: 3 daughters and 1 son Support: Geologist, Engineering History: denies   Past Medical History:  Past Medical History:  Diagnosis Date   Arthritis    Diabetes mellitus 2008   type II   Dyslipidemia    Erectile dysfunction    History of cardiac catheterization 06/2013   Dr. Peter Jordan   HTN (hypertension)    Hypogonadism male     Past Surgical History:  Procedure Laterality Date   APPENDECTOMY     BILIARY STENT PLACEMENT  04/02/2024   Procedure: INSERTION, STENT, BILE DUCT;  Surgeon: Rosalie Kitchens, MD;  Location: [facility] ENDOSCOPY;  Service: Gastroenterology;;   VASSIE STUDY  11/02/2021   Procedure: BUBBLE STUDY;  Surgeon: Alvan Ronal BRAVO, MD;  Location: [facility] ENDOSCOPY;  Service: Cardiovascular;;   CHOLECYSTECTOMY N/A 03/14/2024   Procedure: LAPAROSCOPIC CHOLECYSTECTOMY;  Surgeon: Dasie Leonor CROME, MD;  [facility];  Service: General;  Laterality: N/A;   COLONOSCOPY  08/2006   Dr. Aneita - repeat 2018   ERCP N/A 04/02/2024   Procedure: ERCP, WITH INTERVENTION IF INDICATED;  Surgeon: Rosalie Kitchens, MD;  Location: [facility] ENDOSCOPY;  Service: Gastroenterology;  Laterality: N/A;   INCISION AND DRAINAGE PERIRECTAL  ABSCESS     INDOCYANINE GREEN  FLUORESCENCE IMAGING (ICG) N/A 03/14/2024   Procedure: INDOCYANINE GREEN  FLUORESCENCE IMAGING (ICG);  Surgeon: Dasie Leonor CROME, MD;  Location: [facility] OR;  Service: General;  Laterality: N/A;   KNEE ARTHROSCOPY Left 12/28/2014   Procedure: ARTHROSCOPY LEFT KNEE WITH  MEDIAL MENSICUS DEBRIDEMENT;  Surgeon: Dempsey Moan, MD;  Location: [facility] ORS;  Service: Orthopedics;  Laterality: Left;   KNEE SURGERY     left knee - remote past   LEFT HEART CATHETERIZATION WITH CORONARY ANGIOGRAM N/A 06/11/2013   Procedure: LEFT HEART CATHETERIZATION WITH CORONARY ANGIOGRAM;  Surgeon: Peter M Jordan, MD;  Location: [facility] CATH LAB;  Service: Cardiovascular;  Laterality: N/A;   LOOP RECORDER INSERTION N/A 10/22/2021   Procedure: LOOP RECORDER INSERTION;  Surgeon: Fernande Elspeth BROCKS, MD;  Location: [facility] INVASIVE CV LAB;  Service: Cardiovascular;  Laterality: N/A;   PANCREATIC STENT PLACEMENT  04/02/2024   Procedure: INSERTION, STENT, PANCREATIC DUCT;  Surgeon: Rosalie Kitchens, MD;  Location: [facility] ENDOSCOPY;  Service: Gastroenterology;;   right arm and elbow surgery     STENT REMOVAL  04/02/2024   Procedure: STENT REMOVAL;  Surgeon: Rosalie Kitchens, MD;  Location: [facility] ENDOSCOPY;  Service: Gastroenterology;;   TEE WITHOUT CARDIOVERSION N/A 11/02/2021   Procedure: TRANSESOPHAGEAL ECHOCARDIOGRAM (TEE);  Surgeon: Alvan Ronal BRAVO, MD;  Location: [facility] ENDOSCOPY;  Service: Cardiovascular;  Laterality: N/A;   TONSILLECTOMY     TOTAL KNEE ARTHROPLASTY Left 10/04/2015   Procedure: LEFT TOTAL KNEE ARTHROPLASTY right knee aspiration and injection;  Surgeon: Dempsey Moan, MD;  Location: [facility] ORS;  Service: Orthopedics;  Laterality: Left;   TOTAL KNEE ARTHROPLASTY Right 11/04/2016   Procedure: RIGHT TOTAL KNEE ARTHROPLASTY;  Surgeon: Dempsey Moan, MD;  Location: [facility] ORS;  Service: Orthopedics;  Laterality: Right;  with abductor block   wisdom teeth extractions      Family History:  Family History  Problem Relation Age of Onset    Diabetes Mother    Other Father        death by MVA   Other Brother        died of MVA   Stroke Neg Hx    Heart disease Neg Hx    Cancer Neg Hx    Hypertension Neg Hx     Social History:  Social History   Socioeconomic History   Marital status: Married    Spouse name: Not on file   Number of children: Not on file   Years of education: Not on file   Highest education level: Not on file  Occupational History   Occupation: retired    Comment: prior physical therapist  Tobacco Use   Smoking status: Former    Current packs/day: 0.25    Types: Cigarettes   Smokeless tobacco: Never   Tobacco comments:    Started smoking again, 1 pack every 4 days  Substance and Sexual Activity   Alcohol use: No   Drug use: No   Sexual activity: Never  Other Topics Concern   Not on file  Social History Narrative   Married 22 years, 2 daughter and 1 son. Retired, used to work as a PT.  Exercises treadmill daily, some weight bearing  exercise. Diet    Social Drivers of Corporate Investment Banker Strain: Not on file  Food Insecurity: Low Risk  (04/09/2024)   Received from [facility]   Hunger Vital Sign    Within the past 12 months, you worried that your food would run out before you got money to buy more: Never true    Within the past 12 months, the food you bought just didn't last and you didn't have money to get more. : Never true  Transportation Needs: No Transportation Needs (04/09/2024)   Received from [facility]    In the past 12 months, has lack of reliable transportation kept you from medical appointments, meetings, work or from getting things needed for daily living? : No  Physical Activity: Not on file  Stress: Not on file  Social Connections: Socially Integrated (04/03/2024)   Social Connection and Isolation Panel    Frequency of Communication with Friends and Family: More than three times a week    Frequency of Social Gatherings with Friends and Family:  More than three times a week    Attends Religious Services: More than 4 times per year    Active Member of [employer] or Organizations: Yes    Attends Banker Meetings: More than 4 times per year    Marital Status: Married    Allergies:  Allergies  Allergen Reactions   Diclofenac  Sodium Other (See Comments)    Gel caused a burning sensation    Lisinopril  Cough   Losartan  Cough    Unknown bad side effects     Metabolic Disorder Labs: Lab Results  Component Value Date   HGBA1C 3.6 (L) 03/11/2024   MPG 56.62 03/11/2024   MPG 131.24 10/21/2021   Lab Results  Component Value Date   PROLACTIN 3.7 11/15/2011   Lab Results  Component Value Date   CHOL 163 10/21/2021   TRIG 84 10/21/2021   HDL 58 10/21/2021   CHOLHDL 2.8 10/21/2021   VLDL 17 10/21/2021   LDLCALC 88 10/21/2021   LDLCALC 76 08/23/2014   Lab Results  Component Value Date   TSH 1.841 01/14/2014   TSH 1.304 11/15/2011    Therapeutic Level Labs: No results found for: LITHIUM No results found for: VALPROATE No results found for: CBMZ  Current Medications: Current Outpatient Medications  Medication Sig Dispense Refill   acetaminophen  (TYLENOL ) 500 MG tablet Take 1,000 mg by mouth every 6 (six) hours as needed for mild pain.     albuterol (VENTOLIN HFA) 108 (90 Base) MCG/ACT inhaler SMARTSIG:2 Puff(s) By Mouth Every 4 Hours PRN     amLODipine  (NORVASC ) 10 MG tablet Take 10 mg by mouth daily.     aspirin  EC 81 MG tablet Take 1 tablet (81 mg total) by mouth daily. Swallow whole. 30 tablet    bisacodyl  (DULCOLAX) 10 MG suppository Place 1 suppository (10 mg total) rectally daily as needed for moderate constipation. 12 suppository 0   Calcium  Carbonate Antacid (TUMS PO) Take 2 tablets by mouth daily as needed (reflux).     clopidogrel  (PLAVIX ) 75 MG tablet Take 1 tablet (75 mg total) by mouth daily. 90 tablet 3   hydrOXYzine  (VISTARIL ) 25 MG capsule Take 1 capsule (25 mg total) by mouth 3 (three)  times daily as needed for anxiety. 90 capsule 1   [Paused] Insulin  Glargine (LANTUS  SOLOSTAR) 100 UNIT/ML Solostar Pen Inject 15 Units into the skin 2 (two) times daily. (Patient not taking: Reported on 03/11/2024)  15 mL 3   ketorolac  (ACULAR ) 0.5 % ophthalmic solution Place 1 drop into the right eye as needed (every 3 months after eye injection).     magic mouthwash SOLN Take 15 mLs by mouth 4 (four) times daily as needed for mouth pain (sore throat). Suspension contains equal amounts of Maalox Extra Strength, nystatin, and diphenhydramine . 120 mL 0   magnesium  hydroxide (MILK OF MAGNESIA) 400 MG/5ML suspension Take 30 mLs by mouth daily as needed for mild constipation.     metFORMIN  (GLUCOPHAGE ) 1000 MG tablet Take 1 tablet (1,000 mg total) by mouth daily. (Patient taking differently: Take 1,000 mg by mouth 2 (two) times daily with a meal.) 180 tablet 0   MOUNJARO 15 MG/0.5ML Pen Inject 15 mg into the skin once a week. Mondays     ondansetron  (ZOFRAN ) 4 MG tablet Take 1 tablet (4 mg total) by mouth every 6 (six) hours as needed for nausea. 20 tablet 0   oxyCODONE  (OXY IR/ROXICODONE ) 5 MG immediate release tablet Take 1 tablet (5 mg total) by mouth every 4 (four) hours as needed for moderate pain (pain score 4-6) or severe pain (pain score 7-10). 15 tablet 0   rosuvastatin  (CRESTOR ) 40 MG tablet Take 1 tablet (40 mg total) by mouth daily.     sertraline  (ZOLOFT ) 100 MG tablet Take 1 tablet (100 mg total) by mouth in the morning and at bedtime. 60 tablet 1   Study - OCEANIC-STROKE - asundexian 50 mg or placebo tablet (PI-Sethi) Take 1 tablet (50 mg total) by mouth daily. For Investigational Use Only. Take at the same time each day (preferably in the morning). Tablet should be swallowed whole with water; it CANNOT be crushed or broken. Please contact [facility] if you have any questions regarding this medication or study. (Patient not taking: Reported on 04/03/2024) 196 tablet 0   traZODone   (DESYREL ) 100 MG tablet Take 1 tablet (100 mg total) by mouth at bedtime. 30 tablet 1   No current facility-administered medications for this visit.    Psychiatric Specialty Exam: General Appearance: appears at stated age, casually dressed and groomed   Behavior: pleasant and cooperative   Psychomotor Activity: no psychomotor agitation or retardation noted   Eye Contact: fair  Speech: normal amount, volume and fluency    Mood: depressed Affect: congruent  Thought Process: linear, goal directed, no circumstantial or tangential thought process noted, no racing thoughts or flight of ideas  Descriptions of Associations: intact   Thought Content Hallucinations: denies AH, VH , does not appear responding to stimuli  Delusions: no paranoia, delusions of control, grandeur, ideas of reference, thought broadcasting, and magical thinking  Suicidal Thoughts: denies SI, intention, plan  Homicidal Thoughts: denies HI, intention, plan   Alertness/Orientation: alert and fully oriented   Insight: fair Judgment: fair  Memory: intact   Executive Functions  Concentration: intact  Attention Span: fair  Recall: intact  Fund of Knowledge: fair   Physical Exam  General: Pleasant, well-appearing. No acute distress. Pulmonary: Normal effort. No wheezing or rales. Skin: No obvious rash or lesions. Neuro: A&Ox3.No focal deficit.  Review of Systems  No reported symptoms   Screenings: GAD-7    Flowsheet Row Counselor from 03/02/2024 in [facility]  Total GAD-7 Score 10   PHQ2-9    Flowsheet Row Counselor from 03/02/2024 in [facility] Counselor from 08/02/2022 in [facility] Counselor from 04/29/2022 in [facility] Counselor  from 02/19/2022 in [facility] Counselor from 01/21/2022 in [place]  PHQ-2 Total Score 3 3 3  6 5   PHQ-9 Total Score 10 12 11 15 25    Flowsheet Row ED to Hosp-Admission (Discharged) from 04/03/2024 in [facility] Admission (Discharged) from 04/02/2024 in [facility] ENDOSCOPY ED to Hosp-Admission (Discharged) from 03/10/2024 in [facility]  C-SSRS RISK CATEGORY No Risk No Risk No Risk     Ismael Franco, MD PGY-3 Psychiatry Resident

## 2024-04-30 ENCOUNTER — Other Ambulatory Visit: Payer: Self-pay | Admitting: Gastroenterology

## 2024-05-06 ENCOUNTER — Other Ambulatory Visit (HOSPITAL_COMMUNITY): Payer: Self-pay | Admitting: Psychiatry

## 2024-05-06 ENCOUNTER — Ambulatory Visit (INDEPENDENT_AMBULATORY_CARE_PROVIDER_SITE_OTHER): Admitting: Psychiatry

## 2024-05-06 ENCOUNTER — Ambulatory Visit: Payer: Self-pay

## 2024-05-06 VITALS — BP 129/72 | Wt 217.0 lb

## 2024-05-06 DIAGNOSIS — I639 Cerebral infarction, unspecified: Secondary | ICD-10-CM

## 2024-05-06 DIAGNOSIS — F331 Major depressive disorder, recurrent, moderate: Secondary | ICD-10-CM

## 2024-05-06 DIAGNOSIS — F411 Generalized anxiety disorder: Secondary | ICD-10-CM

## 2024-05-06 DIAGNOSIS — F3341 Major depressive disorder, recurrent, in partial remission: Secondary | ICD-10-CM

## 2024-05-06 LAB — CUP PACEART REMOTE DEVICE CHECK
Date Time Interrogation Session: 20251029231734
Implantable Pulse Generator Implant Date: 20230417

## 2024-05-06 MED ORDER — SERTRALINE HCL 100 MG PO TABS: 100.0000 mg | ORAL_TABLET | Freq: Two times a day (BID) | ORAL | 1 refills | Status: DC

## 2024-05-06 MED ORDER — TRAZODONE HCL 100 MG PO TABS: 100.0000 mg | ORAL_TABLET | Freq: Every day | ORAL | 1 refills | Status: DC

## 2024-05-06 MED ORDER — HYDROXYZINE PAMOATE 25 MG PO CAPS: 25.0000 mg | ORAL_CAPSULE | Freq: Three times a day (TID) | ORAL | 1 refills | Status: DC | PRN

## 2024-05-09 ENCOUNTER — Ambulatory Visit: Payer: Self-pay | Admitting: Cardiology

## 2024-05-11 ENCOUNTER — Ambulatory Visit (INDEPENDENT_AMBULATORY_CARE_PROVIDER_SITE_OTHER): Admitting: Licensed Clinical Social Worker

## 2024-05-11 DIAGNOSIS — F321 Major depressive disorder, single episode, moderate: Secondary | ICD-10-CM

## 2024-05-11 DIAGNOSIS — F411 Generalized anxiety disorder: Secondary | ICD-10-CM

## 2024-05-11 NOTE — Progress Notes (Signed)
 Remote Loop Recorder Transmission

## 2024-05-11 NOTE — Progress Notes (Signed)
 THERAPIST PROGRESS NOTE  Session Time: 3  Participation Level: Active  Behavioral Response: CasualAlertAnxious and Depressed  Type of Therapy: Individual Therapy  Treatment Goals addressed:  Active     OP Depression     LTG: Reduce frequency, intensity, and duration of depression symptoms so that daily functioning is improved (Progressing)     Start:  03/02/24    Expected End:  08/06/24         LTG: Increase coping skills to manage depression and improve ability to perform daily activities     Start:  03/02/24    Expected End:  08/06/24         LTG: [place] will score less than 5 on the Patient Health Questionnaire (PHQ-9)      Start:  03/02/24    Expected End:  08/06/24         STG: Jerry [name] will complete at least 80% of assigned homework  (Progressing)     Start:  03/02/24    Expected End:  08/06/24         STG: Jerry [name] will reduce frequency of avoidant behaviors by 50% as evidenced by self-report in therapy sessions (Progressing)     Start:  03/02/24    Expected End:  08/06/24         Encourage Jerry [name] to participate in recovery peer support activities weekly      Start:  03/02/24         Therapist will administer the PHQ-9 at weekly intervals for the next 12 weeks     Start:  03/02/24         Work with Jerry [name] to track symptoms, triggers, and/or skill use through a mood chart, diary card, or journal (Completed)     Start:  03/02/24    End:  05/11/24      Provide Jerry [name] educational information and reading material on dissociation, its causes, and symptoms (Completed)     Start:  03/02/24    End:  05/11/24      Work with Jerry [name] to identify the major components of a recent episode of depression: physical symptoms, major thoughts and images, and major behaviors they experienced (Completed)     Start:  03/02/24    End:  05/11/24         ProgressTowards Goals: Progressing  Interventions: CBT, Motivational Interviewing, and Supportive  Summary: Jerry [name] is a 68 y.o. male who presents with    Suicidal/Homicidal: Nowithout intent/plan  Therapist Response:      S: Subjective Patient Jerry [name] was alert and oriented x5, pleasant, cooperative, and maintained good eye contact. He engaged well during the therapy session and was casually dressed. Patient presented with an anxious mood and affect.  Jerry [name] stated, "I have been up and down," referring to fluctuations in his mental health. He identified current stressors including the deaths of two friends and recent medical illness. Patient reported that he underwent an emergency gallbladder removal approximately 30 days ago. He described experiencing tension, worry, and fear of death during his recovery.  Jerry [name] shared that, due to increased stress from illness, grief, and his daughter's ongoing struggles with pain, he recently restarted smoking cigarettes. He self-reported smoking approximately three cigarettes daily. Despite these challenges, Jerry [name] stated he continues to utilize coping skills such as attending church, spending time with family for emotional support, and setting healthy boundaries--specifically taking 5-10 minutes alone in his room without distractions for self-regulation.  O: Objective  Alert and oriented x5.  Pleasant, cooperative,  maintained good eye contact.  Casually dressed, appropriately groomed.  Mood and affect anxious but congruent.  Thought process logical and goal-directed.  Insight and judgment appear fair to good.  No suicidal or homicidal ideation expressed.  A: Assessment Patient presents with anxiety and mild depressive symptoms related to grief, medical recovery, and family stress. Demonstrates self-awareness of emotional fluctuations and use of both adaptive and maladaptive coping mechanisms (resumed smoking). Patient remains motivated to improve mental health and expresses openness to therapeutic interventions. No immediate safety concerns  identified.  P: Plan / Intervention  LCSW utilized psychodynamic therapy to promote expression of thoughts, feelings, and concerns in a supportive, nonjudgmental environment.  Employed empathy and validation to normalize emotional responses and strengthen rapport.  Used motivational interviewing techniques (reflective listening, positive affirmations, and open-ended questions) to enhance insight and readiness for change.  Educated patient on the stages of change (precontemplation, contemplation, action, and maintenance).  Applied cognitive behavioral therapy (CBT) for behavioral activation and to distinguish between healthy and unhealthy coping habits.  Discussed provider transition: LCSW to transfer to [facility] office as of December 14.  Presented patient with options to either transfer with current LCSW or to establish care with a new therapist at [facility].  Patient to decide next steps at the December 2 in-person session at 3:00 PM.  Plan: Return again in 3 weeks.  Diagnosis: Current moderate episode of major depressive disorder without prior episode (HCC)  Generalized anxiety disorder  Collaboration of Care: Other patient to continue medication management and therapeutic services with [facility].  Patient/Guardian was advised Release of Information must be obtained prior to any record release in order to collaborate their care with an outside provider. Patient/Guardian was advised if they have not already done so to contact the registration department to sign all necessary forms in order for us  to release information regarding their care.   Consent: Patient/Guardian gives verbal consent for treatment and assignment of benefits for services provided during this visit. Patient/Guardian expressed understanding and agreed to proceed.   Juliene GORMAN Patee, LCSW 05/11/2024

## 2024-05-12 NOTE — Progress Notes (Signed)
 Date of Service: 05/12/2024 Patient DOB: 1955-08-29   Subjective:     Patient ID: Jerry [name] is a 68 y.o. male. Patient comes in for Follow-up (Not fasting/) Patient is a 68 year old male with history  of:               - Status post laparoscopic cholecystectomy: Occurred on 04/03/2024 after acute cholecystitis but patient developed a bile leak and a JP drain was placed but patient ended up undergoing an ERCP in 09/25 and had a pancreatic stent placed with a biliary stricture removal and also had a biliary stent placed in the common bile duct or patient later developed pancreatitis that resolved.  Currently seen GI doctor and had a repeat CT of the abdomen/pelvis on 04/09/2024 showing:   1. 6.0 x 3.2 cm gas and fluid collection in the gallbladder fossa, decreased    from prior and partially traversed by the drain.    2. Common bile duct stent in appropriate position with mild common hepatic duct    wall enhancement, without biliary dilatation. Trace pneumobilia.   Pt had the drained removed and going to have stents removed in the future; sometimes still has some abd pain               -CVA/aortic wall thrombus: currently on asa 81mg  daily and plavis 75mg  every day;  occured in April 2023; on asa and plavix ; saw neurology currently in [facility] Stroke study and has a loop recorder;  neurology advised to keep blood pressure below 130/90 and A1c is below 6.5 and LDL below 70. Was found to have a small PFO which neurology states is insignificant since TCD bubble study was negative.          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 Focal fibrofatty atherosclerotic plaque in the aortic isthmus          with an associated tongue of wall adherent thrombus measuring 6 x 4          mm protruding into the aortic lumen. This could represent a source          for distal embolization.           2. Slightly limited evaluation of the right subclavian artery          secondary to dense contrast bolus within the adjacent right          subclavian vein. However, there is no evidence of subclavian          aneurysm or other acute abnormality. The vessel is mildly tortuous          which may account for the physical exam finding of a pulsatile          subclavian mass.          Patient was then started on aspirin  81 mg daily and Plavix  75 mg daily and was ref to vascular surgeon who is going to repeat the image to see if the aortic wall thrombus has changed and then going to scan the entire aortia         Vascular surgery did a carotid US  that was not significant in 05/24 and needs to make a f/u apt; .                    -urinary incont: pt has  had issues with urinary incont since his CVA where he normally has to wear adult diapers.                      -Low magnesium : on magnesium  oxide 400 mg three times a day;                      -Dyslipidemia: Crestor  40 mg daily Pt was start on Zetia  10 mg daily but held after his gallbladder surgery/pancreatitis;              -Hypertension: Currently on amlodipine  10 mg daily,                      -Diabetes mellitus: Currently on metformin  1000 mg twice a day, was on Mounjaro in the past but stopped due to excessive weight loss and pancreatitis.                       A1C:         Lab Results        Component     Value   Date        HGBA1C         4.5       09/02/2023    03/11/24 (3.6)                         Lipids:         Lab Results        Component     Value   Date        CHOL  208 (H)            05/28/2023        CHOL  217 (H)            12/26/2022        CHOL  106      01/30/2022                 Lab Results        Component     Value   Date        HDL     54 (L)   05/28/2023        HDL     48 (L)   12/26/2022         HDL     44        01/30/2022                 Lab Results        Component     Value   Date        LDLCALC        137 (H)            05/28/2023        LDLCALC        147 (H)            12/26/2022                 Lab Results        Component     Value   Date        TRIG    78        05/28/2023        TRIG    104      12/26/2022  TRIG    65        01/30/2022                 No results found for: CHOLHDL                 Microalbumin: due                   Blood sugars: AM 130s, had 1 low at 68           Eye exam: 2024 Dr. Floy        Foot exam: 05/28/23        Other Labs:                                -Depression: Currently on trazodone  100 mg nightly, Zoloft  150 mg daily, hydroxyzine  25mg  qtid; currently seeing [facility] Currently controlled.                      -tobacco abuse: last cig Aug 2023; smoked for 40years but less than a back a day;                       -back/neck pain: tried chiropractic care but patient had an x-ray of the cervical lumbar spine that showed straightening of the natural curvature and received treatmentswithout any improvement and sees Dr. Vear. Pt has numbness in both shoulder and has mid lower back pain. Pt has been taking Tylenol  without improvement. Pt is on study medication for his stroke. Pt had an MRI of c-spine showing moderate to severe spinal stenosis with foraminal narrowing. Pt is seeing Dr. Laqueta with ortho where he was prescribed oxycodone  5mg  but states Noroc 5mg  work better but now not as affective.                         -ED: trouble getting an erection; 11/23 started on Viagra  prn without improvement; 02/24 was started on Cialis  but did not work.                  -Elevated PSA: was ref to urology, pt would like to go to [facility] as pt has a balance with [facility] but [facility] was not able to get a  hold of him;                 Labs:     10/25 pancreas is still mildly elevated but improving from prior lab (167 from 234).   10/25 CMP has slightly elevated blood sugar (106) however patient's liver enzymes are normal.  Patient CBC shows normal white blood cell count and mildly low hemoglobin (12.3 from 11.9) that is improving from recent lab done at the hospital.  Patient's lipase for his pancreas has mildly increased (234 from 100) which is currently not significant and not even close to as high as patient's lipase previously was when he was having a flareup of the pancreatitis (1,008)        02/25 magnesium  is normal and his A1c is doing great at 4.5 and would recommend to stop the Lantus         12/24  PSA for prostate cancer screening is still elevated and would recommend a referral to urology         11/24  PSA for prostate cancer screening came  back elevated (6.72 from 4.01) and would recommend repeat PSA level in 1 month for reevaluation.  Patient's hepatitis C lab was negative and patient CBC lab had no significant abnormalities.  Patient CMP had mildly elevated total bilirubin (1.6 from 1.2) which has been elevated in the past and with the rest of the liver enzymes being normal, Patient's blood sugars were slightly low where patient's A1c was great at 4.7 however having this low of an A1c is concerning that patient may continue having low blood sugars throughout the day and would recommend to decrease his Lantus  insulin  from 15 units daily to 5 units daily and will repeat A1c lab in 3 months and if A1C is still doing great, will than stop the insulin . Patient's cholesterol was mildly elevated (LDL 137) and would recommend to continue Crestor  and Zetia  cholesterol medications with a low-fat/low carbohydrate diet.  Patient's magnesium  level was low (1.3 from 1.8) and would recommend to increase magnesium  oxide from 400 mg twice a day to 400 mg 3 times a day and will repeat magnesium   level when we repeat the A1c level in 3 months.     Misc:       Review of Systems Review of Systems  Constitutional:  Negative for chills, fatigue and fever.  Respiratory:  Negative for cough, shortness of breath and wheezing.   Cardiovascular:  Negative for chest pain, palpitations and leg swelling.  Gastrointestinal:  Positive for abdominal pain. Negative for constipation, diarrhea, nausea and vomiting.  Musculoskeletal:  Positive for back pain.  Psychiatric/Behavioral:  Negative for agitation and behavioral problems.      Medical History[1]  Surgical History[2]    Allergies[3]    Social History   Social History Narrative  . Not on file    Family History[4]    Objective:  BP 124/69   Pulse 83   Temp 98.2 F (36.8 C) (Oral)   Resp 16   Ht 1.93 m (6' 3.98)   Wt 102 kg (224 lb 9.6 oz)   SpO2 97%   BMI 27.35 kg/m   Physical Exam Physical Exam Constitutional:      General: He is not in acute distress.    Appearance: Normal appearance. He is not ill-appearing.  HENT:     Head: Normocephalic and atraumatic.  Cardiovascular:     Rate and Rhythm: Normal rate and regular rhythm.     Heart sounds: No murmur heard. Pulmonary:     Effort: Pulmonary effort is normal. No respiratory distress.     Breath sounds: No stridor. No wheezing, rhonchi or rales.  Abdominal:     General: There is no distension.     Palpations: There is no mass.     Tenderness: There is abdominal tenderness. There is no guarding or rebound.     Hernia: No hernia is present.     Comments: Mild diffuse abdominal discomfort with palpation.  Neurological:     Mental Status: He is alert.  Psychiatric:        Mood and Affect: Mood normal.        Behavior: Behavior normal.         Labs: No results found for this or any previous visit (from the past week).  Assessment/Plan:  1. Abdominal pain, unspecified abdominal location Abdominal pain is still present but not as significant as  it was at last appointment.  Will obtain labs and recommend for the patient to follow-up with the GI doctor as directed.  Go to  emergency room right away if symptoms get worse. - Lipase - CBC with Differential - Comprehensive Metabolic Panel  2. Biliary anastomotic leak Follow-up with GI doctor as directed.  3. H/O: CVA (cerebrovascular accident) Will continue to work on controlling patient's blood pressure, blood sugars, and cholesterol.  Follow-up with cardiology as directed.  Continue aspirin  and Plavix .  4. Dyslipidemia Continue Crestor  as directed.  Will hold off on restarting statin at this time. Recommend a low fat/carbohydrate diet with increase in exercising.   5. Essential hypertension controlled, continue current medication   6. Type 2 diabetes mellitus without complication, without long-term current use of insulin     (CMD) Continue metformin  as directed. Recommend a low fat/carbohydrate diet with increase in exercising.   7. Depression, unspecified depression type Controlled, continue to continue to follow with your psychiatrist as directed.  8. Other chronic pain Back pain stable, continue Norco as needed and directed for severe pain.  Call if pain gets worse.  9. Elevated PSA (Primary) Obtain repeat PSA level now and follow-up with urology as directed. - PSA, Total - Ambulatory referral to Urology; Future  Obtain labs (CBC, CMP, lipase, PSA) now.  Follow-up in 6 months for Medicare wellness visit or sooner if needed.  Call for any question/concerns.  Electronically signed by: Shawn P Lazoff, DO 05/12/2024 2:45 PM  This document was created using the aid of voice recognition Dragon dictation software.         [1] Past Medical History: Diagnosis Date  . Abscess of face   . Asthma   . Cellulitis, face   . Cerebrovascular accident [facility]) 10/30/2021   05/28/23 Progress note- H/O: CVA (cerebrovascular accident) Continue medications and follow-up with your  cardiologist and neurologist as directed.     . Chondromalacia, left knee   . Depression   . Diabetes mellitus   . Medial meniscus tear   . Rheumatoid arthritis(714.0)   . Stroke    (CMD) 10/18/2021   second stroke 10/31/21  [2] Past Surgical History: Procedure Laterality Date  . APPENDECTOMY     Procedure: APPENDECTOMY  . CARDIAC CATHETERIZATION  06/2013   Procedure: CARDIAC CATHETERIZATION; Dr. Peter Jordan- Nonobstructive  . CATARACT EXTRACTION W/  INTRAOCULAR LENS IMPLANT Right 09/18/2022   CNA0T0 20.0  . CATARACT EXTRACTION W/  INTRAOCULAR LENS IMPLANT Left 09/25/2022   CNA0T0 20.0D  . COLONOSCOPY  08/2006   Procedure: COLONOSCOPY; Dr. Aneita- Repeat 2018  . INCISION AND DRAINAGE PERIRECTAL ABSCESS     Procedure: INCISION AND DRAINAGE PERIRECTAL ABSCESS  . TOTAL KNEE ARTHROPLASTY Left    Procedure: TOTAL KNEE ARTHROPLASTY  [3] Allergies Allergen Reactions  . Diclofenac  Sodium Other (See Comments)    Gel caused a burning sensation   . Lisinopril  Cough  . Losartan  Other (See Comments)    Unknown bad side effects  [4] Family History Problem Relation Name Age of Onset  . Diabetes Mother    . Hypertension Mother    . Rheum arthritis Mother    . Stroke Sister    . Mental illness Sister

## 2024-05-13 ENCOUNTER — Encounter (HOSPITAL_COMMUNITY): Admitting: Psychiatry

## 2024-05-13 NOTE — Progress Notes (Signed)
 Please let patient know that his PSA lab is now normal (3.28 from 6.27 from 6.72) where we can cancel patient's urology referral and will continue to observe.  Patient's pancreatic lipase enzyme was normal.  Patient's CMP had mildly elevated blood sugars (114) however patient was likely nonfasting.  Patient's CBC had mild to moderately low hemoglobin (10.9 from 12.3 from 14.6) where the hemoglobin has decreased from last lab drawl and would recommend to repeat a nonfasting CBC and iron lab in 2 weeks for reevaluation.  Call for questions/concerns.

## 2024-05-26 ENCOUNTER — Encounter (HOSPITAL_COMMUNITY): Payer: Self-pay

## 2024-05-26 ENCOUNTER — Ambulatory Visit (HOSPITAL_COMMUNITY): Admitting: Licensed Clinical Social Worker

## 2024-05-26 ENCOUNTER — Encounter (HOSPITAL_COMMUNITY): Payer: Self-pay | Admitting: Gastroenterology

## 2024-05-28 NOTE — Progress Notes (Signed)
 Please let patient know that his CBC shows mildly low but improved hemoglobin (12.4 from 10.9) with normal iron and we will continue to observe.  Call for any questions/concerns.

## 2024-06-01 ENCOUNTER — Ambulatory Visit (HOSPITAL_COMMUNITY)

## 2024-06-01 ENCOUNTER — Encounter (HOSPITAL_COMMUNITY): Payer: Self-pay | Admitting: Gastroenterology

## 2024-06-01 ENCOUNTER — Ambulatory Visit (HOSPITAL_COMMUNITY): Admitting: Anesthesiology

## 2024-06-01 ENCOUNTER — Ambulatory Visit (HOSPITAL_COMMUNITY)
Admission: RE | Admit: 2024-06-01 | Discharge: 2024-06-01 | Disposition: A | Discharge status: Home / Self Care | Attending: Gastroenterology | Admitting: Gastroenterology

## 2024-06-01 ENCOUNTER — Encounter (HOSPITAL_COMMUNITY)
Admission: RE | Disposition: A | Discharge status: Home / Self Care | Payer: Self-pay | Source: Home / Self Care | Attending: Gastroenterology

## 2024-06-01 DIAGNOSIS — Z87891 Personal history of nicotine dependence: Secondary | ICD-10-CM | POA: Insufficient documentation

## 2024-06-01 DIAGNOSIS — K838 Other specified diseases of biliary tract: Secondary | ICD-10-CM

## 2024-06-01 DIAGNOSIS — Z794 Long term (current) use of insulin: Secondary | ICD-10-CM | POA: Insufficient documentation

## 2024-06-01 DIAGNOSIS — I10 Essential (primary) hypertension: Secondary | ICD-10-CM | POA: Diagnosis not present

## 2024-06-01 DIAGNOSIS — Z4659 Encounter for fitting and adjustment of other gastrointestinal appliance and device: Secondary | ICD-10-CM | POA: Insufficient documentation

## 2024-06-01 DIAGNOSIS — M1990 Unspecified osteoarthritis, unspecified site: Secondary | ICD-10-CM | POA: Insufficient documentation

## 2024-06-01 DIAGNOSIS — E119 Type 2 diabetes mellitus without complications: Secondary | ICD-10-CM | POA: Diagnosis not present

## 2024-06-01 HISTORY — PX: ERCP: SHX5425

## 2024-06-01 HISTORY — PX: ESOPHAGOGASTRODUODENOSCOPY: SHX5428

## 2024-06-01 LAB — GLUCOSE, CAPILLARY: Glucose-Capillary: 98 mg/dL (ref 70–99)

## 2024-06-01 MED ORDER — ONDANSETRON HCL 4 MG/2ML IJ SOLN
INTRAMUSCULAR | Status: DC | PRN
  Administered 2024-06-01: 4 mg via INTRAVENOUS

## 2024-06-01 MED ORDER — SODIUM CHLORIDE 0.9 % IV SOLN: INTRAVENOUS | Status: DC

## 2024-06-01 MED ORDER — CIPROFLOXACIN IN D5W 400 MG/200ML IV SOLN
INTRAVENOUS | Status: AC
  Filled 2024-06-01: qty 200

## 2024-06-01 MED ORDER — DICLOFENAC SUPPOSITORY 100 MG
RECTAL | Status: AC
  Filled 2024-06-01: qty 1

## 2024-06-01 MED ORDER — PROPOFOL 10 MG/ML IV BOLUS
INTRAVENOUS | Status: AC
  Filled 2024-06-01: qty 20

## 2024-06-01 MED ORDER — FENTANYL CITRATE (PF) 100 MCG/2ML IJ SOLN
INTRAMUSCULAR | Status: AC
  Filled 2024-06-01: qty 2

## 2024-06-01 MED ORDER — PHENYLEPHRINE 80 MCG/ML (10ML) SYRINGE FOR IV PUSH (FOR BLOOD PRESSURE SUPPORT)
PREFILLED_SYRINGE | INTRAVENOUS | Status: DC | PRN
  Administered 2024-06-01: 40 ug via INTRAVENOUS

## 2024-06-01 MED ORDER — LACTATED RINGERS IV SOLN: INTRAVENOUS | Status: DC | PRN

## 2024-06-01 MED ORDER — SODIUM CHLORIDE 0.9 % IV SOLN
INTRAVENOUS | Status: AC | PRN
  Administered 2024-06-01: 500 mL via INTRAMUSCULAR

## 2024-06-01 MED ORDER — CIPROFLOXACIN IN D5W 400 MG/200ML IV SOLN
INTRAVENOUS | Status: DC | PRN
  Administered 2024-06-01: 400 mg via INTRAVENOUS

## 2024-06-01 MED ORDER — PROPOFOL 10 MG/ML IV BOLUS
INTRAVENOUS | Status: DC | PRN
  Administered 2024-06-01: 100 mg via INTRAVENOUS
  Administered 2024-06-01 (×2): 50 mg via INTRAVENOUS

## 2024-06-01 MED ORDER — FENTANYL CITRATE (PF) 100 MCG/2ML IJ SOLN
INTRAMUSCULAR | Status: DC | PRN
  Administered 2024-06-01 (×2): 50 ug via INTRAVENOUS

## 2024-06-01 MED ORDER — LIDOCAINE 2% (20 MG/ML) 5 ML SYRINGE
INTRAMUSCULAR | Status: DC | PRN
  Administered 2024-06-01: 100 mg via INTRAVENOUS

## 2024-06-01 MED ORDER — INDOMETHACIN 50 MG RE SUPP
RECTAL | Status: AC
  Filled 2024-06-01: qty 1

## 2024-06-01 MED ORDER — SUCCINYLCHOLINE CHLORIDE 200 MG/10ML IV SOSY
PREFILLED_SYRINGE | INTRAVENOUS | Status: DC | PRN
  Administered 2024-06-01: 100 mg via INTRAVENOUS

## 2024-06-01 MED ORDER — EPHEDRINE SULFATE (PRESSORS) 25 MG/5ML IV SOSY
PREFILLED_SYRINGE | INTRAVENOUS | Status: DC | PRN
  Administered 2024-06-01: 10 mg via INTRAVENOUS

## 2024-06-01 MED ORDER — SODIUM CHLORIDE 0.9 % IV SOLN
INTRAVENOUS | Status: DC | PRN
  Administered 2024-06-01: 10 mL

## 2024-06-01 MED ORDER — DEXAMETHASONE SOD PHOSPHATE PF 10 MG/ML IJ SOLN
INTRAMUSCULAR | Status: DC | PRN
  Administered 2024-06-01: 10 mg via INTRAVENOUS

## 2024-06-01 MED ORDER — INDOMETHACIN 50 MG RE SUPP
RECTAL | Status: DC | PRN
  Administered 2024-06-01: 50 mg via RECTAL

## 2024-06-01 NOTE — Transfer of Care (Signed)
 Immediate Anesthesia Transfer of Care Note  Patient: Jerry [name]  Procedure(s) Performed: EGD (ESOPHAGOGASTRODUODENOSCOPY) ERCP, WITH INTERVENTION IF INDICATED  Patient Location: PACU  Anesthesia Type:General  Level of Consciousness: awake, alert , and oriented  Airway & Oxygen Therapy: Patient Spontanous Breathing and Patient connected to nasal cannula oxygen  Post-op Assessment: Report given to RN and Post -op Vital signs reviewed and stable  Post vital signs: Reviewed and stable  Last Vitals:  Vitals Value Taken Time  BP    Temp    Pulse 85 06/01/24 10:45  Resp 21 06/01/24 10:45  SpO2 97 % 06/01/24 10:45  Vitals shown include unfiled device data.  Last Pain:  Vitals:   06/01/24 0803  TempSrc: Temporal  PainSc: 0-No pain         Complications: No notable events documented.

## 2024-06-01 NOTE — Anesthesia Postprocedure Evaluation (Signed)
 Anesthesia Post Note  Patient: Jerry [name]  Procedure(s) Performed: EGD (ESOPHAGOGASTRODUODENOSCOPY) ERCP, WITH INTERVENTION IF INDICATED     Patient location during evaluation: Endoscopy Anesthesia Type: General Level of consciousness: awake Pain management: pain level controlled Vital Signs Assessment: post-procedure vital signs reviewed and stable Respiratory status: spontaneous breathing Cardiovascular status: blood pressure returned to baseline Postop Assessment: no apparent nausea or vomiting Anesthetic complications: no   No notable events documented.  Last Vitals:  Vitals:   06/01/24 1055 06/01/24 1105  BP: (!) 165/95 (!) 169/86  Pulse: 83 76  Resp: (!) 21 13  Temp:    SpO2: 97% 98%    Last Pain:  Vitals:   06/01/24 1105  TempSrc:   PainSc: 0-No pain                 Lauraine KATHEE [place]

## 2024-06-01 NOTE — Progress Notes (Signed)
 Jerry [name] 8:39 AM  Subjective: Patient seen and examined and he has not had any medical problems since we saw him recently in the office and his abdomen is occasionally sore which comes and goes but no major complaints and we rediscussed the procedure with both he and his wife  Objective: Vital signs stable afebrile no acute distress exam please see preassessment evaluation  Assessment: Bile leak status post stent  Plan: Okay to proceed with ERCP stent removal and reinject just to make sure leak has healed with anesthesia assistance  [facility]  office [phone] After 5PM or if no answer call [phone]

## 2024-06-01 NOTE — Anesthesia Procedure Notes (Signed)
 Procedure Name: Intubation Date/Time: 06/01/2024 9:53 AM  Performed by: Obadiah Reyes BROCKS, CRNAPre-anesthesia Checklist: Patient identified, Emergency Drugs available, Suction available and Patient being monitored Patient Re-evaluated:Patient Re-evaluated prior to induction Oxygen Delivery Method: Circle System Utilized Preoxygenation: Pre-oxygenation with 100% oxygen Induction Type: IV induction Ventilation: Mask ventilation without difficulty Laryngoscope Size: Miller and 2 Grade View: Grade II Tube type: Oral Number of attempts: 1 Airway Equipment and Method: Stylet and Oral airway Placement Confirmation: ETT inserted through vocal cords under direct vision, positive ETCO2 and breath sounds checked- equal and bilateral Secured at: 23 cm Tube secured with: Tape Dental Injury: Teeth and Oropharynx as per pre-operative assessment

## 2024-06-01 NOTE — Op Note (Signed)
 [facility] Patient Name: Jerry [name] Procedure Date: 06/01/2024 MRN: [id number] Attending MD: Oliva Boots , MD, [id number] Date of Birth: 1956/05/29 CSN: [id number] Age: 68 Admit Type: Outpatient Procedure:                ERCP Indications:              Follow-up of bile leak, Biliary stent removal Providers:                Oliva Boots, MD, Gregoria Pierce, RN, Curtistine Bishop, Technician Referring MD:              Medicines:                General Anesthesia Complications:            No immediate complications. Estimated Blood Loss:     Estimated blood loss: none. Procedure:                Pre-Anesthesia Assessment:                           - Prior to the procedure, a History and Physical                            was performed, and patient medications and                            allergies were reviewed. The patient's tolerance of                            previous anesthesia was also reviewed. The risks                            and benefits of the procedure and the sedation                            options and risks were discussed with the patient.                            All questions were answered, and informed consent                            was obtained. Prior Anticoagulants: The patient has                            taken Plavix  (clopidogrel ), last dose was 4 days                            prior to procedure. ASA Grade Assessment: III - A                            patient with severe systemic disease. After  reviewing the risks and benefits, the patient was                            deemed in satisfactory condition to undergo the                            procedure.                           After obtaining informed consent, the scope was                            passed under direct vision. Throughout the                            procedure, the patient's blood pressure, pulse, and                             oxygen saturations were monitored continuously. The                            [id number]-[id number] ([id number]) Olympus duodenoscope was                            introduced through the mouth, and used to inject                            contrast into and used to cannulate the bile duct.                            The ERCP was accomplished without difficulty. The                            patient tolerated the procedure well. Scope In: Scope Out: Findings:      One temporary plastic biliary stent originating in the biliary tree was       emerging from the major papilla. The stent was not assessed for patency.       One stent was removed from the biliary tree using a Raptor grasping       device. We cannulated with the balloon catheter without difficulty and       deep selective cannulation was obtained and dye was injected which       confirmed proper location and the wire was advanced into the       intrahepatic and to discover objects, the biliary tree was swept with a       12 mm balloon starting at the bifurcation. Sludge was swept from the       duct. We then proceeded with an occlusion cholangiogram which showed no       residual filling abnormalities and no cystic duct leak and the balloon       was withdrawn through the pain sphincterotomy site and there was       adequate biliary drainage and the wire and the balloon were removed and       the patient tolerated the procedure well and there was no pancreatic  duct injection or wire advancement throughout the procedure and the       scope was removed Impression:               - One not assessed for patency stent from the                            biliary tree was seen in the major papilla.                           - One stent was removed from the biliary tree.                           - The biliary tree was swept and sludge was found.                            But no residual abnormality on multiple  balloon                            pull-through's and no cystic duct leak on occlusion                            cholangiogram Moderate Sedation:      Not Applicable - Patient had care per Anesthesia. Recommendation:           - Clear liquid diet for 6 hours. If doing well at 4                            PM may have soft solid                           - Continue present medications.                           - Resume Plavix  (clopidogrel ) at prior dose                            tomorrow. If doing well                           - Return to GI clinic PRN.                           - Telephone GI clinic if symptomatic PRN. Procedure Code(s):        --- Professional ---                           [phone], Endoscopic retrograde                            cholangiopancreatography (ERCP); with removal of                            foreign body(s) or stent(s) from biliary/pancreatic  duct(s)                           43264, Endoscopic retrograde                            cholangiopancreatography (ERCP); with removal of                            calculi/debris from biliary/pancreatic duct(s) Diagnosis Code(s):        --- Professional ---                           Z96.89, Presence of other specified functional                            implants                           Z46.59, Encounter for fitting and adjustment of                            other gastrointestinal appliance and device                           K83.8, Other specified diseases of biliary tract CPT copyright 2022 [employer]. All rights reserved. The codes documented in this report are preliminary and upon coder review may  be revised to meet current compliance requirements. Oliva Boots, MD 06/01/2024 10:37:45 AM This report has been signed electronically. Number of Addenda: 0

## 2024-06-01 NOTE — Anesthesia Preprocedure Evaluation (Signed)
 Anesthesia Evaluation  Patient identified by MRN, date of birth, ID band Patient awake    Reviewed: Allergy & Precautions, NPO status , Patient's Chart, lab work & pertinent test results  History of Anesthesia Complications Negative for: history of anesthetic complications  Airway Mallampati: II  TM Distance: >3 FB Neck ROM: Full    Dental  (+) Teeth Intact, Dental Advisory Given   Pulmonary former smoker   breath sounds clear to auscultation       Cardiovascular hypertension, Pt. on medications  Rhythm:Regular Rate:Normal  TTE (10/2021): 1. Left ventricular ejection fraction, by estimation, is 70 to 75%. The  left ventricle has hyperdynamic function.   2. Right ventricular systolic function is normal. The right ventricular  size is normal.   3. No left atrial/left atrial appendage thrombus was detected.   4. The mitral valve is normal in structure. No evidence of mitral valve  regurgitation.   5. The aortic valve is normal in structure. Aortic valve regurgitation is  not visualized.   6. There is Severe (Grade IV) plaque.   7. 1-2 bubbles at ~ 3-4 cardiac cycles. possible small PFO.     Neuro/Psych CVA    GI/Hepatic   Endo/Other  diabetes, Type 2, Insulin  Dependent  GLP1-agonist > 1 month ago  Renal/GU      Musculoskeletal  (+) Arthritis ,    Abdominal   Peds  Hematology  (+) Blood dyscrasia, anemia   Anesthesia Other Findings   Reproductive/Obstetrics                              Anesthesia Physical Anesthesia Plan  ASA: 2  Anesthesia Plan: General   Post-op Pain Management:    Induction: Intravenous  PONV Risk Score and Plan: 2 and Treatment may vary due to age or medical condition  Airway Management Planned: Oral ETT  Additional Equipment:   Intra-op Plan:   Post-operative Plan: Extubation in OR  Informed Consent:      Dental advisory given  Plan Discussed  with: CRNA  Anesthesia Plan Comments:          Anesthesia Quick Evaluation

## 2024-06-01 NOTE — Discharge Instructions (Addendum)
 Call if GI question or problem otherwise may have liquids only until 4 PM and if doing well may have soft solids and if doing well tomorrow may restart your Plavix  and follow-up as needed from a GI standpoint  YOU HAD AN ENDOSCOPIC PROCEDURE TODAY: Refer to the procedure report and other information in the discharge instructions given to you for any specific questions about what was found during the examination. If this information does not answer your questions, please call [facility] office at [phone] to clarify.   YOU SHOULD EXPECT: Some feelings of bloating in the abdomen. Passage of more gas than usual. Walking can help get rid of the air that was put into your GI tract during the procedure and reduce the bloating.   DIET: Your first meal following the procedure should be a light meal and then it is ok to progress to your normal diet. A half-sandwich or bowl of soup is an example of a good first meal. Heavy or fried foods are harder to digest and may make you feel nauseous or bloated. Drink plenty of fluids but you should avoid alcoholic beverages for 24 hours.  ACTIVITY: Your care partner should take you home directly after the procedure. You should plan to take it easy, moving slowly for the rest of the day. You can resume normal activity the day after the procedure however YOU SHOULD NOT DRIVE, use power tools, machinery or perform tasks that involve climbing or major physical exertion for 24 hours (because of the sedation medicines used during the test).   SYMPTOMS TO REPORT IMMEDIATELY: A gastroenterologist can be reached at any hour. Please call [phone]  for any of the following symptoms:   Following upper endoscopy (EGD, EUS, ERCP, esophageal dilation) Vomiting of blood or coffee ground material  New, significant abdominal pain  New, significant chest pain or pain under the shoulder blades  Painful or persistently difficult swallowing  New shortness of breath  Black,  tarry-looking or red, bloody stools  FOLLOW UP:  If any biopsies were taken you will be contacted by phone or by letter within the next 1-3 weeks. Call [phone]  if you have not heard about the biopsies in 3 weeks.  Please also call with any specific questions about appointments or follow up tests. YOU HAD AN ENDOSCOPIC PROCEDURE TODAY: Refer to the procedure report and other information in the discharge instructions given to you for any specific questions about what was found during the examination. If this information does not answer your questions, please call [facility] office at [phone] to clarify.

## 2024-06-02 ENCOUNTER — Encounter (HOSPITAL_COMMUNITY): Payer: Self-pay | Admitting: Gastroenterology

## 2024-06-06 ENCOUNTER — Ambulatory Visit

## 2024-06-07 ENCOUNTER — Ambulatory Visit: Attending: Cardiology

## 2024-06-07 DIAGNOSIS — I639 Cerebral infarction, unspecified: Secondary | ICD-10-CM | POA: Diagnosis not present

## 2024-06-08 ENCOUNTER — Ambulatory Visit (HOSPITAL_COMMUNITY): Admitting: Licensed Clinical Social Worker

## 2024-06-08 DIAGNOSIS — F32A Depression, unspecified: Secondary | ICD-10-CM

## 2024-06-08 DIAGNOSIS — F411 Generalized anxiety disorder: Secondary | ICD-10-CM

## 2024-06-08 DIAGNOSIS — F3341 Major depressive disorder, recurrent, in partial remission: Secondary | ICD-10-CM

## 2024-06-08 LAB — CUP PACEART REMOTE DEVICE CHECK
Date Time Interrogation Session: 20251130231310
Implantable Pulse Generator Implant Date: 20230417

## 2024-06-08 NOTE — Progress Notes (Signed)
 THERAPIST PROGRESS NOTE  Session Time: 66  Participation Level: Active  Behavioral Response: CasualAlertAnxious and Depressed  Type of Therapy: Individual Therapy  Treatment Goals addressed:  Active     OP Depression     LTG: Reduce frequency, intensity, and duration of depression symptoms so that daily functioning is improved (Progressing)     Start:  03/02/24    Expected End:  08/06/24         LTG: Increase coping skills to manage depression and improve ability to perform daily activities (Progressing)     Start:  03/02/24    Expected End:  08/06/24         LTG: Jerry [name] will score less than 5 on the Patient Health Questionnaire (PHQ-9)      Start:  03/02/24    Expected End:  08/06/24         STG: Jerry [name] will complete at least 80% of assigned homework  (Progressing)     Start:  03/02/24    Expected End:  08/06/24         STG: Jerry [name] will reduce frequency of avoidant behaviors by 50% as evidenced by self-report in therapy sessions (Progressing)     Start:  03/02/24    Expected End:  08/06/24         Therapist will administer the PHQ-9 at weekly intervals for the next 12 weeks     Start:  03/02/24         Work with Jerry [name] to track symptoms, triggers, and/or skill use through a mood chart, diary card, or journal (Completed)     Start:  03/02/24    End:  05/11/24      Encourage Jerry [name] to participate in recovery peer support activities weekly  (Completed)     Start:  03/02/24    End:  06/08/24      Provide Jerry [name] educational information and reading material on dissociation, its causes, and symptoms (Completed)     Start:  03/02/24    End:  05/11/24      Work with Jerry [name] to identify the major components of a recent episode of depression: physical symptoms, major thoughts and images, and major behaviors they experienced (Completed)     Start:  03/02/24    End:  05/11/24         ProgressTowards Goals: Progressing  Interventions: CBT and Motivational  Interviewing  Suicidal/Homicidal: Nowithout intent/plan  Therapist Response:   S -- Subjective: Jerry [name] was alert and oriented 5. He was pleasant, cooperative, and maintained good eye contact. He engaged well in the therapy session and was dressed casually.  Patient reports that his primary stressor is physical illness. He recently had a bowel stent removed. He reports that he is currently taking narcotic medication and has been experiencing increased fatigue in combination with his psychiatric medications, stating, "Today I would already be in bed or taking a nap if I did not have an appointment."  Patient reports that moving forward, although he would prefer to continue seeing this LCSW at the [place] office, he would like to remain in the [place] area. He will be contacted by administrative and front desk scheduling staff for clarification regarding appointment scheduling once new provider schedules are updated in Epic.  Patient identifies additional stressors including grief and loss; he reports losing three more friends within the last four weeks. He states he is relying on his faith in "God," which has helped him cope with these losses. Patient also reports ongoing financial stress,  stating he is struggling to keep up with bills but is "taking it one day at a time."  O -- Objective: Patient was alert, oriented 5, cooperative, maintained appropriate eye contact, and presented with casual attire. Affect appeared congruent with mood and discussion content. No acute safety concerns observed.  A -- Assessment: Patient is experiencing increased stress related to physical illness, including recovery from bowel stent removal and medication-related fatigue. Additional stressors include significant recent grief and loss, financial strain, and uncertainty regarding ongoing therapy location. Patient appears motivated for continued treatment and is utilizing faith-based coping strategies. No  evidence of suicidal or homicidal ideation reported during the session.  P -- Plan:  LCSW provided psychodynamic therapy to explore thoughts, feelings, and emotional responses in a nonjudgmental environment.  Supportive therapy was utilized for praise, encouragement, and emotional support.  Validation and empathy provided to normalize patient's emotional experience.  Strengths-based interventions used to review short-term and long-term goals.  Administrative and scheduling staff will follow up with the patient regarding next steps for scheduling once new provider availability is updated in Epic.  Patient encouraged to continue utilizing positive coping strategies and to monitor physical symptoms and medication effects.  Diagnosis: No diagnosis found.  Collaboration of Care: Other None today   Patient/Guardian was advised Release of Information must be obtained prior to any record release in order to collaborate their care with an outside provider. Patient/Guardian was advised if they have not already done so to contact the registration department to sign all necessary forms in order for us  to release information regarding their care.   Consent: Patient/Guardian gives verbal consent for treatment and assignment of benefits for services provided during this visit. Patient/Guardian expressed understanding and agreed to proceed.   Juliene GORMAN Patee, LCSW 06/08/2024

## 2024-06-10 DIAGNOSIS — F32A Depression, unspecified: Secondary | ICD-10-CM | POA: Insufficient documentation

## 2024-06-11 NOTE — Progress Notes (Signed)
 Remote Loop Recorder Transmission

## 2024-06-14 NOTE — Progress Notes (Signed)
 [facility] MD/PA/NP OP Progress Note  06/24/2024 3:31 PM Jerry [name]  MRN:  [id number]   Assessment and Plan: Patient presents in person for today's visit. In the prior visit, no med changes were made. Today, patient continues to be psychiatrically stable on his psychotropic medications. Discussed his transient impaired sleep at this time attributable to his stomach pain 2/2 recent EGD and will likely resolve. Discussed continuing to see therapist to aid with coping strategies. No med changes today, f/u in 3 months.   # MDD recurrent, mild w/ anxious distress # GAD # Insomnia - Continue Zoloft  200 mg daily - Continue trazodone  100 mg nightly - Continue hydroxyzine  25 mg TID PRN, for anxiety - Therapy: Adam Goldammer -> Myla Sear  # Tobacco use d/o - Encourage cessation, pt is motivated  # Hx of Complicated grief-resolved  Identifying information: Jerry [name] is a 68 year old patient with a past psychiatric history of reported PTSD, MDD, GAD who presents today for medication management.  Chief Complaint: med mgmt  Subjective   Patient seen alone.  Patient reports feeling okay today. Since the previous visit, he notes taking things a day at a time. He had an EGD last month and they let him know everything is good. He notes home life is quiet. He enjoys watching sports like football and basketball.   He saw Adam for therapy and he felt it was fine. Patient reports the medications are helping with his mood and worry.  Patient reports poor sleep, difficulty falling asleep, sleeping from 3-10 AM. He notes his stomach pain is keeping him awake. Patient reports good appetite.   Patient denies current SI, HI, and AVH.   Substance use:  Reports cigarette use- smoking 3 cigarettes a day Reports last alcohol drink was 03/2024  Past Psychiatric History:  Previously seen at [facility] for therapy and medication management Diagnosed with PTSD, MDD and GAD Completed therapy with Dr.  Juleen 01/24/2022 - 11/25/2022; reports benefit Previous medications: trazodone  and Lexapro  No history of inpatient psychiatric care No history of suicide attempts  Family Psychiatric History: Granddaughter: Deceased, substance use disorder, bipolar, PTSD - History of depression in multiple sisters - Oldest sister: EtOH use disorder Mother: Dementia   Social History:  Living: with wife Occupation: retired for 3 years, used to be a paramedic Relationship: married for 44 years Children: 3 daughters and 1 son Support: Geologist, Engineering History: denies   Past Medical History:  Past Medical History:  Diagnosis Date   Arthritis    Diabetes mellitus 2008   type II   Dyslipidemia    Erectile dysfunction    History of cardiac catheterization 06/2013   Dr. Peter Jordan   HTN (hypertension)    Hypogonadism male     Past Surgical History:  Procedure Laterality Date   APPENDECTOMY     BILIARY STENT PLACEMENT  04/02/2024   Procedure: INSERTION, STENT, BILE DUCT;  Surgeon: Rosalie Kitchens, MD;  Location: [facility] ENDOSCOPY;  Service: Gastroenterology;;   VASSIE STUDY  11/02/2021   Procedure: BUBBLE STUDY;  Surgeon: Alvan Ronal BRAVO, MD;  Location: [facility] ENDOSCOPY;  Service: Cardiovascular;;   CHOLECYSTECTOMY N/A 03/14/2024   Procedure: LAPAROSCOPIC CHOLECYSTECTOMY;  Surgeon: Dasie Leonor CROME, MD;  Location: [facility] OR;  Service: General;  Laterality: N/A;   COLONOSCOPY  08/2006   Dr. Aneita - repeat 2018   ERCP N/A 04/02/2024   Procedure: ERCP, WITH INTERVENTION IF INDICATED;  Surgeon: Rosalie Kitchens, MD;  Location: [facility] ENDOSCOPY;  Service: Gastroenterology;  Laterality:  N/A;   ERCP N/A 06/01/2024   Procedure: ERCP, WITH INTERVENTION IF INDICATED;  Surgeon: Rosalie Kitchens, MD;  Location: [facility] ENDOSCOPY;  Service: Gastroenterology;  Laterality: N/A;   ESOPHAGOGASTRODUODENOSCOPY N/A 06/01/2024   Procedure: EGD (ESOPHAGOGASTRODUODENOSCOPY);  Surgeon: Rosalie Kitchens, MD;  Location: [facility] ENDOSCOPY;  Service: Gastroenterology;   Laterality: N/A;  stent removal   INCISION AND DRAINAGE PERIRECTAL ABSCESS     INDOCYANINE GREEN  FLUORESCENCE IMAGING (ICG) N/A 03/14/2024   Procedure: INDOCYANINE GREEN  FLUORESCENCE IMAGING (ICG);  Surgeon: Dasie Leonor CROME, MD;  Location: [facility] OR;  Service: General;  Laterality: N/A;   KNEE ARTHROSCOPY Left 12/28/2014   Procedure: ARTHROSCOPY LEFT KNEE WITH  MEDIAL MENSICUS DEBRIDEMENT;  Surgeon: Dempsey Moan, MD;  Location: [facility] ORS;  Service: Orthopedics;  Laterality: Left;   KNEE SURGERY     left knee - remote past   LEFT HEART CATHETERIZATION WITH CORONARY ANGIOGRAM N/A 06/11/2013   Procedure: LEFT HEART CATHETERIZATION WITH CORONARY ANGIOGRAM;  Surgeon: Peter M Jordan, MD;  Location: [facility] CATH LAB;  Service: Cardiovascular;  Laterality: N/A;   LOOP RECORDER INSERTION N/A 10/22/2021   Procedure: LOOP RECORDER INSERTION;  Surgeon: Fernande Elspeth BROCKS, MD;  Location: [facility] INVASIVE CV LAB;  Service: Cardiovascular;  Laterality: N/A;   PANCREATIC STENT PLACEMENT  04/02/2024   Procedure: INSERTION, STENT, PANCREATIC DUCT;  Surgeon: Rosalie Kitchens, MD;  Location: [facility] ENDOSCOPY;  Service: Gastroenterology;;   right arm and elbow surgery     STENT REMOVAL  04/02/2024   Procedure: STENT REMOVAL;  Surgeon: Rosalie Kitchens, MD;  Location: [facility] ENDOSCOPY;  Service: Gastroenterology;;   TEE WITHOUT CARDIOVERSION N/A 11/02/2021   Procedure: TRANSESOPHAGEAL ECHOCARDIOGRAM (TEE);  Surgeon: Alvan Ronal BRAVO, MD;  Location: [facility] ENDOSCOPY;  Service: Cardiovascular;  Laterality: N/A;   TONSILLECTOMY     TOTAL KNEE ARTHROPLASTY Left 10/04/2015   Procedure: LEFT TOTAL KNEE ARTHROPLASTY right knee aspiration and injection;  Surgeon: Dempsey Moan, MD;  Location: [facility] ORS;  Service: Orthopedics;  Laterality: Left;   TOTAL KNEE ARTHROPLASTY Right 11/04/2016   Procedure: RIGHT TOTAL KNEE ARTHROPLASTY;  Surgeon: Dempsey Moan, MD;  Location: [facility] ORS;  Service: Orthopedics;  Laterality: Right;  with abductor block   wisdom teeth extractions       Family History:  Family History  Problem Relation Age of Onset   Diabetes Mother    Other Father        death by MVA   Other Brother        died of MVA   Stroke Neg Hx    Heart disease Neg Hx    Cancer Neg Hx    Hypertension Neg Hx     Social History:  Social History   Socioeconomic History   Marital status: Married    Spouse name: Not on file   Number of children: Not on file   Years of education: Not on file   Highest education level: Not on file  Occupational History   Occupation: retired    Comment: prior physical therapist  Tobacco Use   Smoking status: Former    Current packs/day: 0.25    Types: Cigarettes   Smokeless tobacco: Never   Tobacco comments:    Started smoking again, 1 pack every 4 days  Substance and Sexual Activity   Alcohol use: No   Drug use: No   Sexual activity: Never  Other Topics Concern   Not on file  Social History Narrative   Married 22 years, 2 daughter and 1 son. Retired, used to work  as a PT.  Exercises treadmill daily, some weight bearing exercise. Diet    Social Drivers of Health   Tobacco Use: Medium Risk (06/01/2024)   Patient History    Smoking Tobacco Use: Former    Smokeless Tobacco Use: Never    Passive Exposure: Not on file  Financial Resource Strain: Not on file  Food Insecurity: Low Risk (05/12/2024)   Received from [facility]   Epic    Within the past 12 months, you worried that your food would run out before you got money to buy more: Never true    Within the past 12 months, the food you bought just didn't last and you didn't have money to get more. : Never true  Transportation Needs: No Transportation Needs (05/12/2024)   Received from [facility]    In the past 12 months, has lack of reliable transportation kept you from medical appointments, meetings, work or from getting things needed for daily living? : No  Physical Activity: Not on file  Stress: Not on file  Social Connections:  Socially Integrated (04/03/2024)   Social Connection and Isolation Panel    Frequency of Communication with Friends and Family: More than three times a week    Frequency of Social Gatherings with Friends and Family: More than three times a week    Attends Religious Services: More than 4 times per year    Active Member of Clubs or Organizations: Yes    Attends Banker Meetings: More than 4 times per year    Marital Status: Married  Depression (PHQ2-9): Medium Risk (03/02/2024)   Depression (PHQ2-9)    PHQ-2 Score: 10  Alcohol Screen: Not on file  Housing: Low Risk (05/12/2024)   Received from [facility]   Epic    What is your living situation today?: I have a steady place to live    Think about the place you live. Do you have problems with any of the following? Choose all that apply:: None/None on this list  Utilities: Low Risk (05/12/2024)   Received from [facility]   Utilities    In the past 12 months has the electric, gas, oil, or water company threatened to shut off services in your home? : No  Health Literacy: Not on file    Allergies:  Allergies  Allergen Reactions   Diclofenac  Sodium Other (See Comments)    Gel caused a burning sensation    Lisinopril  Cough   Losartan  Cough    Unknown bad side effects     Metabolic Disorder Labs: Lab Results  Component Value Date   HGBA1C 3.6 (L) 03/11/2024   MPG 56.62 03/11/2024   MPG 131.24 10/21/2021   Lab Results  Component Value Date   PROLACTIN 3.7 11/15/2011   Lab Results  Component Value Date   CHOL 163 10/21/2021   TRIG 84 10/21/2021   HDL 58 10/21/2021   CHOLHDL 2.8 10/21/2021   VLDL 17 10/21/2021   LDLCALC 88 10/21/2021   LDLCALC 76 08/23/2014   Lab Results  Component Value Date   TSH 1.841 01/14/2014   TSH 1.304 11/15/2011    Therapeutic Level Labs: No results found for: LITHIUM No results found for: VALPROATE No results found for: CBMZ  Current Medications: Current Outpatient  Medications  Medication Sig Dispense Refill   acetaminophen  (TYLENOL ) 500 MG tablet Take 1,000 mg by mouth every 6 (six) hours as needed for mild pain.     albuterol (VENTOLIN HFA) 108 (  90 Base) MCG/ACT inhaler SMARTSIG:2 Puff(s) By Mouth Every 4 Hours PRN     amLODipine  (NORVASC ) 10 MG tablet Take 10 mg by mouth daily.     aspirin  EC 81 MG tablet Take 1 tablet (81 mg total) by mouth daily. Swallow whole. 30 tablet    bisacodyl  (DULCOLAX) 10 MG suppository Place 1 suppository (10 mg total) rectally daily as needed for moderate constipation. 12 suppository 0   Calcium  Carbonate Antacid (TUMS PO) Take 2 tablets by mouth daily as needed (reflux).     clopidogrel  (PLAVIX ) 75 MG tablet Take 1 tablet (75 mg total) by mouth daily. 90 tablet 3   hydrOXYzine  (VISTARIL ) 25 MG capsule Take 1 capsule (25 mg total) by mouth 3 (three) times daily as needed for anxiety. 90 capsule 1   Insulin  Glargine (LANTUS  SOLOSTAR) 100 UNIT/ML Solostar Pen Inject 15 Units into the skin 2 (two) times daily. (Patient not taking: Reported on 03/11/2024) 15 mL 3   ketorolac  (ACULAR ) 0.5 % ophthalmic solution Place 1 drop into the right eye as needed (every 3 months after eye injection).     magic mouthwash SOLN Take 15 mLs by mouth 4 (four) times daily as needed for mouth pain (sore throat). Suspension contains equal amounts of Maalox Extra Strength, nystatin, and diphenhydramine . 120 mL 0   magnesium  hydroxide (MILK OF MAGNESIA) 400 MG/5ML suspension Take 30 mLs by mouth daily as needed for mild constipation.     metFORMIN  (GLUCOPHAGE ) 1000 MG tablet Take 1 tablet (1,000 mg total) by mouth daily. (Patient taking differently: Take 1,000 mg by mouth 2 (two) times daily with a meal.) 180 tablet 0   MOUNJARO 15 MG/0.5ML Pen Inject 15 mg into the skin once a week. Mondays     ondansetron  (ZOFRAN ) 4 MG tablet Take 1 tablet (4 mg total) by mouth every 6 (six) hours as needed for nausea. 20 tablet 0   oxyCODONE  (OXY IR/ROXICODONE ) 5 MG  immediate release tablet Take 1 tablet (5 mg total) by mouth every 4 (four) hours as needed for moderate pain (pain score 4-6) or severe pain (pain score 7-10). 15 tablet 0   rosuvastatin  (CRESTOR ) 40 MG tablet Take 1 tablet (40 mg total) by mouth daily.     sertraline  (ZOLOFT ) 100 MG tablet Take 2 tablets (200 mg total) by mouth daily. 180 tablet 0   Study - OCEANIC-STROKE - asundexian 50 mg or placebo tablet (PI-Sethi) Take 1 tablet (50 mg total) by mouth daily. For Investigational Use Only. Take at the same time each day (preferably in the morning). Tablet should be swallowed whole with water; it CANNOT be crushed or broken. Please contact [facility] if you have any questions regarding this medication or study. (Patient not taking: Reported on 04/03/2024) 196 tablet 0   traZODone  (DESYREL ) 100 MG tablet Take 1 tablet (100 mg total) by mouth at bedtime. 90 tablet 0   No current facility-administered medications for this visit.   Objective  Psychiatric Specialty Exam: General Appearance: appears at stated age, casually dressed and groomed   Behavior: pleasant and cooperative   Psychomotor Activity: no psychomotor agitation or retardation noted   Eye Contact: fair  Speech: normal amount, volume and fluency    Mood: euthymic  Affect: congruent, pleasant and interactive   Thought Process: linear, goal directed, no circumstantial or tangential thought process noted, no racing thoughts or flight of ideas  Descriptions of Associations: intact   Thought Content Hallucinations: denies AH, VH , does not appear  responding to stimuli  Delusions: no paranoia, delusions of control, grandeur, ideas of reference, thought broadcasting, and magical thinking  Suicidal Thoughts: denies SI, intention, plan  Homicidal Thoughts: denies HI, intention, plan   Alertness/Orientation: alert and fully oriented   Insight: fair Judgment: fair  Memory: intact   Executive Functions   Concentration: intact  Attention Span: fair  Recall: intact  Fund of Knowledge: fair   Physical Exam  General: Pleasant, well-appearing. No acute distress. Pulmonary: Normal effort. No wheezing or rales. Skin: No obvious rash or lesions. Neuro: A&Ox3.No focal deficit.  Review of Systems  No reported symptoms   Screenings: GAD-7    Advertising Copywriter from 03/02/2024 in [facility]  Total GAD-7 Score 10   PHQ2-9    Flowsheet Row Counselor from 03/02/2024 in [facility] Counselor from 08/02/2022 in [facility] Counselor from 04/29/2022 in [facility] Counselor from 02/19/2022 in [facility] Counselor from 01/21/2022 in [facility]  PHQ-2 Total Score 3 3 3 6 5   PHQ-9 Total Score 10 12 11 15 25    Flowsheet Row Admission (Discharged) from 06/01/2024 in [facility] ENDOSCOPY ED to Hosp-Admission (Discharged) from 04/03/2024 in [facility] 3M KIDNEY UNIT Admission (Discharged) from 04/02/2024 in [facility] ENDOSCOPY  C-SSRS RISK CATEGORY No Risk No Risk No Risk     Ismael Franco, MD PGY-3 Psychiatry Resident

## 2024-06-21 ENCOUNTER — Encounter (HOSPITAL_COMMUNITY): Payer: Self-pay

## 2024-06-24 ENCOUNTER — Ambulatory Visit (HOSPITAL_COMMUNITY): Admitting: Psychiatry

## 2024-06-24 DIAGNOSIS — F331 Major depressive disorder, recurrent, moderate: Secondary | ICD-10-CM

## 2024-06-24 MED ORDER — SERTRALINE HCL 100 MG PO TABS: 200.0000 mg | ORAL_TABLET | Freq: Every day | ORAL | 0 refills | Status: AC

## 2024-06-24 MED ORDER — HYDROXYZINE PAMOATE 25 MG PO CAPS: 25.0000 mg | ORAL_CAPSULE | Freq: Three times a day (TID) | ORAL | 1 refills | Status: AC | PRN

## 2024-06-24 MED ORDER — TRAZODONE HCL 100 MG PO TABS: 100.0000 mg | ORAL_TABLET | Freq: Every day | ORAL | 0 refills | Status: AC

## 2024-06-25 ENCOUNTER — Ambulatory Visit: Payer: Self-pay | Admitting: Cardiology

## 2024-06-29 ENCOUNTER — Ambulatory Visit (HOSPITAL_COMMUNITY)

## 2024-07-07 ENCOUNTER — Ambulatory Visit

## 2024-07-08 ENCOUNTER — Ambulatory Visit

## 2024-07-08 DIAGNOSIS — I639 Cerebral infarction, unspecified: Secondary | ICD-10-CM

## 2024-07-09 LAB — CUP PACEART REMOTE DEVICE CHECK
Date Time Interrogation Session: 20251231231443
Implantable Pulse Generator Implant Date: 20230417

## 2024-07-10 ENCOUNTER — Ambulatory Visit: Payer: Self-pay | Admitting: Cardiology

## 2024-07-13 NOTE — Progress Notes (Signed)
 Remote Loop Recorder Transmission

## 2024-07-21 ENCOUNTER — Ambulatory Visit (HOSPITAL_COMMUNITY)

## 2024-07-22 ENCOUNTER — Ambulatory Visit (HOSPITAL_COMMUNITY)

## 2024-07-27 ENCOUNTER — Ambulatory Visit (HOSPITAL_COMMUNITY)

## 2024-08-07 ENCOUNTER — Ambulatory Visit

## 2024-08-08 ENCOUNTER — Ambulatory Visit: Attending: Cardiology

## 2024-08-08 DIAGNOSIS — I639 Cerebral infarction, unspecified: Secondary | ICD-10-CM

## 2024-08-10 LAB — CUP PACEART REMOTE DEVICE CHECK
Date Time Interrogation Session: 20260131231028
Implantable Pulse Generator Implant Date: 20230417

## 2024-08-13 NOTE — Progress Notes (Signed)
 Remote Loop Recorder Transmission

## 2024-08-17 ENCOUNTER — Ambulatory Visit (HOSPITAL_COMMUNITY)

## 2024-09-08 ENCOUNTER — Ambulatory Visit

## 2024-09-23 ENCOUNTER — Encounter (HOSPITAL_COMMUNITY): Admitting: Psychiatry

## 2024-10-09 ENCOUNTER — Ambulatory Visit

## 2024-11-09 ENCOUNTER — Ambulatory Visit

## 2024-12-10 ENCOUNTER — Ambulatory Visit

## 2025-01-10 ENCOUNTER — Ambulatory Visit

## 2025-02-10 ENCOUNTER — Ambulatory Visit

## 2025-03-13 ENCOUNTER — Ambulatory Visit

## 2025-04-13 ENCOUNTER — Ambulatory Visit

## 2025-05-14 ENCOUNTER — Ambulatory Visit

## 2025-06-14 ENCOUNTER — Ambulatory Visit

## 2025-07-15 ENCOUNTER — Ambulatory Visit
